# Patient Record
Sex: Male | Born: 1955 | Race: White | Hispanic: No | Marital: Married | Smoking: Current every day smoker
Health system: Southern US, Community
[De-identification: ages and names within clinical notes are randomized; demographics above are authoritative.]

## PROBLEM LIST (undated history)

## (undated) DIAGNOSIS — S98111A Complete traumatic amputation of right great toe, initial encounter: Secondary | ICD-10-CM

## (undated) DIAGNOSIS — M1990 Unspecified osteoarthritis, unspecified site: Secondary | ICD-10-CM

## (undated) DIAGNOSIS — M542 Cervicalgia: Secondary | ICD-10-CM

## (undated) DIAGNOSIS — I739 Peripheral vascular disease, unspecified: Secondary | ICD-10-CM

## (undated) DIAGNOSIS — C801 Malignant (primary) neoplasm, unspecified: Secondary | ICD-10-CM

## (undated) DIAGNOSIS — J189 Pneumonia, unspecified organism: Secondary | ICD-10-CM

## (undated) DIAGNOSIS — T8781 Dehiscence of amputation stump: Secondary | ICD-10-CM

## (undated) DIAGNOSIS — I96 Gangrene, not elsewhere classified: Secondary | ICD-10-CM

## (undated) DIAGNOSIS — E119 Type 2 diabetes mellitus without complications: Secondary | ICD-10-CM

## (undated) DIAGNOSIS — N400 Enlarged prostate without lower urinary tract symptoms: Secondary | ICD-10-CM

## (undated) DIAGNOSIS — M5126 Other intervertebral disc displacement, lumbar region: Secondary | ICD-10-CM

## (undated) HISTORY — DX: Complete traumatic amputation of right great toe, initial encounter: S98.111A

## (undated) HISTORY — PX: OTHER SURGICAL HISTORY: SHX169

## (undated) HISTORY — PX: CHOLECYSTECTOMY: SHX55

## (undated) HISTORY — PX: SKIN CANCER EXCISION: SHX779

## (undated) HISTORY — DX: Gangrene, not elsewhere classified: I96

## (undated) HISTORY — PX: TRANSTHORACIC ECHOCARDIOGRAM: SHX275

## (undated) HISTORY — PX: ABDOMINAL SURGERY: SHX537

---

## 1999-07-08 ENCOUNTER — Encounter: Payer: Self-pay | Admitting: Emergency Medicine

## 1999-07-08 ENCOUNTER — Inpatient Hospital Stay (HOSPITAL_COMMUNITY): Admission: EM | Admit: 1999-07-08 | Discharge: 1999-07-10 | Payer: Self-pay | Admitting: Emergency Medicine

## 2001-08-12 ENCOUNTER — Inpatient Hospital Stay (HOSPITAL_COMMUNITY): Admission: AC | Admit: 2001-08-12 | Discharge: 2001-08-16 | Payer: Self-pay

## 2001-08-12 ENCOUNTER — Encounter: Payer: Self-pay | Admitting: Emergency Medicine

## 2001-08-13 ENCOUNTER — Encounter: Payer: Self-pay | Admitting: General Surgery

## 2001-08-25 ENCOUNTER — Emergency Department (HOSPITAL_COMMUNITY): Admission: EM | Admit: 2001-08-25 | Discharge: 2001-08-25 | Payer: Self-pay | Admitting: Emergency Medicine

## 2001-08-25 ENCOUNTER — Inpatient Hospital Stay (HOSPITAL_COMMUNITY): Admission: EM | Admit: 2001-08-25 | Discharge: 2001-08-27 | Payer: Self-pay | Admitting: Emergency Medicine

## 2002-06-14 ENCOUNTER — Encounter: Payer: Self-pay | Admitting: General Surgery

## 2002-06-16 ENCOUNTER — Ambulatory Visit (HOSPITAL_COMMUNITY): Admission: RE | Admit: 2002-06-16 | Discharge: 2002-06-17 | Payer: Self-pay | Admitting: General Surgery

## 2004-12-23 ENCOUNTER — Encounter: Admission: RE | Admit: 2004-12-23 | Discharge: 2004-12-23 | Payer: Self-pay | Admitting: Orthopedic Surgery

## 2012-07-07 ENCOUNTER — Observation Stay (HOSPITAL_COMMUNITY)
Admission: EM | Admit: 2012-07-07 | Discharge: 2012-07-08 | Disposition: A | Discharge status: Home / Self Care | Payer: 59 | Attending: Internal Medicine | Admitting: Internal Medicine

## 2012-07-07 ENCOUNTER — Emergency Department (HOSPITAL_COMMUNITY): Payer: 59

## 2012-07-07 ENCOUNTER — Encounter (HOSPITAL_COMMUNITY): Payer: Self-pay | Admitting: Emergency Medicine

## 2012-07-07 DIAGNOSIS — R42 Dizziness and giddiness: Secondary | ICD-10-CM | POA: Insufficient documentation

## 2012-07-07 DIAGNOSIS — R61 Generalized hyperhidrosis: Secondary | ICD-10-CM | POA: Insufficient documentation

## 2012-07-07 DIAGNOSIS — E860 Dehydration: Secondary | ICD-10-CM | POA: Diagnosis present

## 2012-07-07 DIAGNOSIS — E119 Type 2 diabetes mellitus without complications: Secondary | ICD-10-CM | POA: Insufficient documentation

## 2012-07-07 DIAGNOSIS — R1110 Vomiting, unspecified: Secondary | ICD-10-CM | POA: Insufficient documentation

## 2012-07-07 DIAGNOSIS — Z9181 History of falling: Secondary | ICD-10-CM | POA: Insufficient documentation

## 2012-07-07 DIAGNOSIS — I959 Hypotension, unspecified: Secondary | ICD-10-CM | POA: Diagnosis present

## 2012-07-07 DIAGNOSIS — E875 Hyperkalemia: Secondary | ICD-10-CM | POA: Diagnosis present

## 2012-07-07 DIAGNOSIS — R55 Syncope and collapse: Principal | ICD-10-CM | POA: Diagnosis present

## 2012-07-07 DIAGNOSIS — D72829 Elevated white blood cell count, unspecified: Secondary | ICD-10-CM | POA: Diagnosis present

## 2012-07-07 HISTORY — DX: Benign prostatic hyperplasia without lower urinary tract symptoms: N40.0

## 2012-07-07 HISTORY — DX: Type 2 diabetes mellitus without complications: E11.9

## 2012-07-07 HISTORY — DX: Other intervertebral disc displacement, lumbar region: M51.26

## 2012-07-07 HISTORY — DX: Unspecified osteoarthritis, unspecified site: M19.90

## 2012-07-07 LAB — TROPONIN I: Troponin I: <0.3 ng/mL (ref <0.30)

## 2012-07-07 LAB — URINALYSIS, ROUTINE W REFLEX MICROSCOPIC
Bilirubin Urine: NEGATIVE
Glucose, UA: 100 mg/dL — AB
Hgb urine dipstick: NEGATIVE
Ketones, ur: NEGATIVE mg/dL
Leukocytes, UA: NEGATIVE
Nitrite: NEGATIVE
Protein, ur: NEGATIVE mg/dL
Specific Gravity, Urine: 1.01 (ref 1.005–1.030)
Urobilinogen, UA: 0.2 mg/dL (ref 0.0–1.0)
pH: 5.5 (ref 5.0–8.0)

## 2012-07-07 LAB — COMPREHENSIVE METABOLIC PANEL
ALT: 11 U/L (ref 0–53)
AST: 15 U/L (ref 0–37)
Albumin: 4 g/dL (ref 3.5–5.2)
Alkaline Phosphatase: 97 U/L (ref 39–117)
BUN: 11 mg/dL (ref 6–23)
CO2: 27 mEq/L (ref 19–32)
Calcium: 9.6 mg/dL (ref 8.4–10.5)
Chloride: 101 mEq/L (ref 96–112)
Creatinine, Ser: 0.86 mg/dL (ref 0.50–1.35)
GFR calc Af Amer: >90 mL/min (ref >90)
GFR calc non Af Amer: >90 mL/min (ref >90)
Glucose, Bld: 252 mg/dL — ABNORMAL HIGH (ref 70–99)
Potassium: 5.5 mEq/L — ABNORMAL HIGH (ref 3.5–5.1)
Sodium: 140 mEq/L (ref 135–145)
Total Bilirubin: 0.3 mg/dL (ref 0.3–1.2)
Total Protein: 6.9 g/dL (ref 6.0–8.3)

## 2012-07-07 LAB — CBC WITH DIFFERENTIAL/PLATELET
Basophils Absolute: 0 10*3/uL (ref 0.0–0.1)
Basophils Relative: 0 % (ref 0–1)
Eosinophils Absolute: 0 10*3/uL (ref 0.0–0.7)
Eosinophils Relative: 0 % (ref 0–5)
HCT: 42.7 % (ref 39.0–52.0)
Hemoglobin: 14.7 g/dL (ref 13.0–17.0)
Lymphocytes Relative: 7 % — ABNORMAL LOW (ref 12–46)
Lymphs Abs: 1.4 10*3/uL (ref 0.7–4.0)
MCH: 31.4 pg (ref 26.0–34.0)
MCHC: 34.4 g/dL (ref 30.0–36.0)
MCV: 91.2 fL (ref 78.0–100.0)
Monocytes Absolute: 1.1 10*3/uL — ABNORMAL HIGH (ref 0.1–1.0)
Monocytes Relative: 6 % (ref 3–12)
Neutro Abs: 17.5 10*3/uL — ABNORMAL HIGH (ref 1.7–7.7)
Neutrophils Relative %: 87 % — ABNORMAL HIGH (ref 43–77)
Platelets: 201 10*3/uL (ref 150–400)
RBC: 4.68 MIL/uL (ref 4.22–5.81)
RDW: 13.1 % (ref 11.5–15.5)
WBC: 20.1 10*3/uL — ABNORMAL HIGH (ref 4.0–10.5)

## 2012-07-07 LAB — GLUCOSE, CAPILLARY: Glucose-Capillary: 216 mg/dL — ABNORMAL HIGH (ref 70–99)

## 2012-07-07 MED ORDER — SODIUM CHLORIDE 0.9 % IV BOLUS (SEPSIS)
1000.0000 mL | Freq: Once | INTRAVENOUS | Status: AC
  Administered 2012-07-07: 1000 mL via INTRAVENOUS

## 2012-07-07 NOTE — ED Notes (Signed)
Admitting physician at bedside

## 2012-07-07 NOTE — ED Notes (Signed)
Patient unable to urinate at this time for urinalysis

## 2012-07-07 NOTE — ED Provider Notes (Signed)
History     CSN: [id number]  Arrival date & time 07/07/12  4696   First MD Initiated Contact with Patient 07/07/12 1825      Chief Complaint  Patient presents with  . Loss of Consciousness    (Consider location/radiation/quality/duration/timing/severity/associated sxs/prior treatment) HPI Comments: 56 year old type II diabetic male presents emergency department via EMS after having a syncopal episode about an hour ago. Patient was in the garage watching his son's band play when he began to feel flushed, lightheaded, dizzy and sweaty. He tried to walk to the door and states he fell his knees. According to EMS the son was able to catch him before he fell completely to the ground. Did not hit his head. States he was "out" for only 1 minute. EMS states when he was sitting in the garage his pressure was 124 systolic, and when he stands his pressure dropped to 84 systolic. CBG at that time was 143. Upon arriving to the emergency department patient vomited once. Earlier today he states he was feeling perfectly fine. Did not eat anything earlier today. Had a pepsi and gatorade while waiting for EMS. Does not have a primary care provider to manage his diabetes, and does not take the metformin he is supposed to be on. Currently he is complaining of lightheadedness, dizziness and weakness. Denies any other medical history. Denies fever, chills, chest pain, shortness of breath, nausea. No history of heart attack or stroke.  Patient is a 56 y.o. male presenting with syncope. The history is provided by the patient and the EMS personnel.  Loss of Consciousness Associated symptoms include diaphoresis, vomiting and weakness. Pertinent negatives include no abdominal pain, chest pain, chills, fever, nausea or neck pain.    Past Medical History  Diagnosis Date  . Diabetes mellitus without complication   . Enlarged prostate   . Arthritis   . Herniated lumbar intervertebral disc     History reviewed. No  pertinent past surgical history.  History reviewed. No pertinent family history.  History  Substance Use Topics  . Smoking status: Current Every Day Smoker  . Smokeless tobacco: Not on file  . Alcohol Use: No      Review of Systems  Constitutional: Positive for diaphoresis. Negative for fever, chills, activity change and appetite change.  HENT: Negative for neck pain and neck stiffness.   Eyes: Negative for visual disturbance.  Respiratory: Negative for shortness of breath.   Cardiovascular: Positive for syncope. Negative for chest pain.  Gastrointestinal: Positive for vomiting. Negative for nausea and abdominal pain.  Genitourinary: Negative for dysuria, urgency, frequency and difficulty urinating.  Musculoskeletal: Negative.   Skin: Positive for pallor.  Neurological: Positive for dizziness, weakness and light-headedness.  Psychiatric/Behavioral: Negative for confusion.    Allergies  Review of patient's allergies indicates no known allergies.  Home Medications  No current outpatient prescriptions on file.  BP 113/73  Pulse 80  Temp 97.4 F (36.3 C) (Oral)  Resp 18  SpO2 93%  Physical Exam  Constitutional: He is oriented to person, place, and time. He appears well-developed and well-nourished. No distress.       Appears tired.  HENT:  Head: Normocephalic and atraumatic.  Mouth/Throat: Uvula is midline and oropharynx is clear and moist. Mucous membranes are pale.  Eyes: Conjunctivae normal and EOM are normal. Pupils are equal, round, and reactive to light.  Neck: Normal range of motion. Neck supple.  Cardiovascular: Normal rate, regular rhythm, normal heart sounds and intact distal pulses.   Pulmonary/Chest:  Effort normal and breath sounds normal. No respiratory distress. He has no wheezes. He has no rales.  Abdominal: Soft. Bowel sounds are normal. There is no tenderness.  Musculoskeletal: Normal range of motion. He exhibits no edema.  Neurological: He is alert  and oriented to person, place, and time. He has normal strength. No cranial nerve deficit or sensory deficit. GCS eye subscore is 4. GCS verbal subscore is 5. GCS motor subscore is 6.  Skin: Skin is warm. No pallor.       Clammy  Psychiatric: He has a normal mood and affect. His behavior is normal.    ED Course  Procedures (including critical care time)  Labs Reviewed  GLUCOSE, CAPILLARY - Abnormal; Notable for the following:    Glucose-Capillary 216 (*)     All other components within normal limits  CBC WITH DIFFERENTIAL - Abnormal; Notable for the following:    WBC 20.1 (*)     Neutrophils Relative 87 (*)     Neutro Abs 17.5 (*)     Lymphocytes Relative 7 (*)     Monocytes Absolute 1.1 (*)     All other components within normal limits  COMPREHENSIVE METABOLIC PANEL - Abnormal; Notable for the following:    Potassium 5.5 (*)     Glucose, Bld 252 (*)     All other components within normal limits  URINALYSIS, ROUTINE W REFLEX MICROSCOPIC - Abnormal; Notable for the following:    Glucose, UA 100 (*)     All other components within normal limits  TROPONIN I   Date: 07/07/2012  Rate: 81  Rhythm: normal sinus rhythm  QRS Axis: normal  Intervals: normal  ST/T Wave abnormalities: nonspecific T wave changes  Conduction Disutrbances:none  Narrative Interpretation: sinus rhythm, borderline T abnormalities, anterior-lateral leads  Old EKG Reviewed: none available   Dg Chest 2 View  07/07/2012  *RADIOLOGY REPORT*  Clinical Data:  Syncope and weakness.  CHEST - 2 VIEW  Comparison: None  Findings: The heart size and mediastinal contours are within normal limits.  Both lungs are clear.  The visualized skeletal structures are unremarkable.  IMPRESSION: No active disease.   Original Report Authenticated By: Irish Lack, M.D.      1. Syncope   2. Leukocytosis       MDM  56 y/o male with diabetes presenting with syncopal episode associated with diaphoresis. Leukocytosis of 20.1.  Origin unknown. Urine clean. CXR unremarkable. Patient will be admitted by Dr. Felipa Furnace. 2L fluid bolus given. Patient currently feeling fine and asymptomatic.        Trevor Mace, PA-C 07/07/12 2310

## 2012-07-07 NOTE — H&P (Signed)
PCP:  none   Chief Complaint:  syncope  HPI: [name] is a 56 y.o. male   has a past medical history of Diabetes mellitus without complication; Enlarged prostate; Arthritis; and Herniated lumbar intervertebral disc.   Presented with  He was listening to his son's band practice and got hot tried to step out and suddenly synopsize. He have not had anything to eat. Denies any chest pain in ambulance he felt some shortness of breath lasting 2-3 min and had some nausea and vomiting. He states he have had syncopal episodes in the past. Last episode in 2000. He denies ever having a stress test.  He has hx of BPH.    Review of Systems:    Pertinent positives include: shortness of breath at rest.  nausea, vomiting,   Constitutional:  No weight loss, night sweats, Fevers, chills, fatigue, weight loss  HEENT:  No headaches, Difficulty swallowing,Tooth/dental problems,Sore throat,  No sneezing, itching, ear ache, nasal congestion, post nasal drip,  Cardio-vascular:  No chest pain, Orthopnea, PND, anasarca, dizziness, palpitations.no Bilateral lower extremity swelling  GI:  No heartburn, indigestion, abdominal pain,diarrhea, change in bowel habits, loss of appetite, melena, blood in stool, hematemesis Resp:  no No dyspnea on exertion, No excess mucus, no productive cough, No non-productive cough, No coughing up of blood.No change in color of mucus.No wheezing. Skin:  no rash or lesions. No jaundice GU:  no dysuria, change in color of urine, no urgency or frequency. No straining to urinate.  No flank pain.  Musculoskeletal:  No joint pain or no joint swelling. No decreased range of motion. No back pain.  Psych:  No change in mood or affect. No depression or anxiety. No memory loss.  Neuro: no localizing neurological complaints, no tingling, no weakness, no double vision, no gait abnormality, no slurred speech, no confusion  Otherwise ROS are negative except for above, 10 systems were  reviewed  Past Medical History: Past Medical History  Diagnosis Date  . Diabetes mellitus without complication   . Enlarged prostate   . Arthritis   . Herniated lumbar intervertebral disc    Past Surgical History  Procedure Date  . Open chest      Medications: Prior to Admission medications   Medication Sig Start Date End Date Taking? Authorizing Provider  oxyCODONE-acetaminophen (PERCOCET/ROXICET) 5-325 MG per tablet Take 1 tablet by mouth every 4 (four) hours as needed. For pain   Yes Historical Provider, MD    Allergies:  No Known Allergies  Social History:  Ambulatory   Independently  Lives at  Home with family   reports that he has been smoking.  He does not have any smokeless tobacco history on file. He reports that he uses illicit drugs (Marijuana). He reports that he does not drink alcohol.   Family History: family history includes Asthma in his mother and Heart disease in his father and mother.    Physical Exam: Patient Vitals for the past 24 hrs:  BP Temp Temp src Pulse Resp SpO2  07/07/12 2130 95/71 mmHg - - 71  20  98 %  07/07/12 2100 101/70 mmHg - - 70  13  97 %  07/07/12 2030 93/70 mmHg - - 88  22  98 %  07/07/12 2016 105/73 mmHg - - 93  - -  07/07/12 2015 106/89 mmHg - - 84  - -  07/07/12 2013 89/63 mmHg - - 72  - -  07/07/12 1900 94/61 mmHg - - 75  12  90 %  07/07/12 1827 113/73 mmHg 97.4 F (36.3 C) Oral 80  18  93 %    1. General:  in No Acute distress 2. Psychological: Alert and  Oriented 3. Head/ENT:    Dry Mucous Membranes                          Head Non traumatic, neck supple                           Poor Dentition 4. SKIN:   decreased Skin turgor,  Skin clean Dry and intact no rash 5. Heart: Regular rate and rhythm no Murmur, Rub or gallop 6. Lungs: somewhat distant but no wheezes or crackles   7. Abdomen: Soft, non-tender, Non distended 8. Lower extremities: no clubbing, cyanosis, or edema 9. Neurologically Grossly intact, moving  all 4 extremities equally 10. MSK: Normal range of motion  body mass index is unknown because there is no height or weight on file.   Labs on Admission:   [facility] 07/07/12 1950  NA 140  K 5.5*  CL 101  CO2 27  GLUCOSE 252*  BUN 11  CREATININE 0.86  CALCIUM 9.6  MG --  PHOS --    Basename 07/07/12 1950  AST 15  ALT 11  ALKPHOS 97  BILITOT 0.3  PROT 6.9  ALBUMIN 4.0   No results found for this basename: LIPASE:2,AMYLASE:2 in the last 72 hours  Basename 07/07/12 1950  WBC 20.1*  NEUTROABS 17.5*  HGB 14.7  HCT 42.7  MCV 91.2  PLT 201   No results found for this basename: CKTOTAL:3,CKMB:3,CKMBINDEX:3,TROPONINI:3 in the last 72 hours No results found for this basename: TSH,T4TOTAL,FREET3,T3FREE,THYROIDAB in the last 72 hours No results found for this basename: VITAMINB12:2,FOLATE:2,FERRITIN:2,TIBC:2,IRON:2,RETICCTPCT:2 in the last 72 hours No results found for this basename: HGBA1C    CrCl is unknown because there is no height on file for the current visit. ABG No results found for this basename: phart, pco2, po2, hco3, tco2, acidbasedef, o2sat     No results found for this basename: DDIMER     Other results:  I have pearsonaly reviewed this: ECG REPORT  Rate: 81  Rhythm: NSR  ST&T Change: NO ischemic changes.   UA no evidence of infection   Cultures: No results found for this basename: sdes, specrequest, cult, reptstatus       Radiological Exams on Admission: Dg Chest 2 View  07/07/2012  *RADIOLOGY REPORT*  Clinical Data:  Syncope and weakness.  CHEST - 2 VIEW  Comparison: None  Findings: The heart size and mediastinal contours are within normal limits.  Both lungs are clear.  The visualized skeletal structures are unremarkable.  IMPRESSION: No active disease.   Original Report Authenticated By: Irish Lack, M.D.     Chart has been reviewed  Assessment/Plan  56 yo male w hx of diabetes and non compliance here with syncopal episode.    Present on Admission:  . Syncope - will admit to telemetry, cycle CE, echo, carotid dopplers. Pending further work up would need inpatient vs outpatient stress test.  . Dehydration - give IV and obtain set of orthostatics  . Hypotension - improved with IVF . Leukocytosis - no evidence of infection will repeat after IVF given . Hyperkalemia - monitor on telemetry and repeat will likely improve with IVF.    Prophylaxis:   Lovenox   CODE STATUS: FULL CODE  Other  plan as per orders.  I have spent a total of 55 min on this admission  Anjolaoluwa Siguenza 07/07/2012, 11:17 PM

## 2012-07-07 NOTE — ED Notes (Signed)
Per EMS, pt was at son's home in [place] didn't feel well, felt hot and flush, stood up to get fresh air and was about to faint when son caught him and sat him down. LOC for about a minute. CBG 143, pale, diaphoretic, NSR, initial EKG brady. BP 124 sitting systolic, standing dropped 84 systolic. High 90's O2 on ra. Emesis x1, transported in trendelenburg position with BP 126. A&Ox4, ambulatory.

## 2012-07-07 NOTE — ED Notes (Signed)
Patient stated "I feel lightheaded and dizzy when I sit up."

## 2012-07-07 NOTE — ED Notes (Addendum)
Pt states he had a heat flash, stood up to walk outside, fell to his knees at son's house today with syncopal episode, son caught pt in arms, LOC for about a minute. A&Ox4, ambulatory, stable.

## 2012-07-08 DIAGNOSIS — I959 Hypotension, unspecified: Secondary | ICD-10-CM

## 2012-07-08 DIAGNOSIS — R55 Syncope and collapse: Secondary | ICD-10-CM

## 2012-07-08 LAB — HEMOGLOBIN A1C
Hgb A1c MFr Bld: 8.5 % — ABNORMAL HIGH (ref <5.7)
Mean Plasma Glucose: 197 mg/dL — ABNORMAL HIGH (ref <117)

## 2012-07-08 LAB — COMPREHENSIVE METABOLIC PANEL
ALT: 11 U/L (ref 0–53)
AST: 14 U/L (ref 0–37)
Albumin: 3.2 g/dL — ABNORMAL LOW (ref 3.5–5.2)
Alkaline Phosphatase: 85 U/L (ref 39–117)
BUN: 10 mg/dL (ref 6–23)
CO2: 29 mEq/L (ref 19–32)
Calcium: 9.2 mg/dL (ref 8.4–10.5)
Chloride: 103 mEq/L (ref 96–112)
Creatinine, Ser: 0.77 mg/dL (ref 0.50–1.35)
GFR calc Af Amer: >90 mL/min (ref >90)
GFR calc non Af Amer: >90 mL/min (ref >90)
Glucose, Bld: 160 mg/dL — ABNORMAL HIGH (ref 70–99)
Potassium: 4.2 mEq/L (ref 3.5–5.1)
Sodium: 141 mEq/L (ref 135–145)
Total Bilirubin: 0.3 mg/dL (ref 0.3–1.2)
Total Protein: 6.1 g/dL (ref 6.0–8.3)

## 2012-07-08 LAB — CBC
HCT: 39.9 % (ref 39.0–52.0)
Hemoglobin: 13.6 g/dL (ref 13.0–17.0)
MCH: 31.3 pg (ref 26.0–34.0)
MCHC: 34.1 g/dL (ref 30.0–36.0)
MCV: 91.7 fL (ref 78.0–100.0)
Platelets: 193 10*3/uL (ref 150–400)
RBC: 4.35 MIL/uL (ref 4.22–5.81)
RDW: 13.1 % (ref 11.5–15.5)
WBC: 12.3 10*3/uL — ABNORMAL HIGH (ref 4.0–10.5)

## 2012-07-08 LAB — GLUCOSE, CAPILLARY
Glucose-Capillary: 245 mg/dL — ABNORMAL HIGH (ref 70–99)
Glucose-Capillary: 200 mg/dL — ABNORMAL HIGH (ref 70–99)
Glucose-Capillary: 130 mg/dL — ABNORMAL HIGH (ref 70–99)

## 2012-07-08 LAB — TROPONIN I
Troponin I: <0.3 ng/mL (ref <0.30)
Troponin I: <0.3 ng/mL (ref <0.30)

## 2012-07-08 LAB — D-DIMER, QUANTITATIVE (NOT AT ARMC): D-Dimer, Quant: 0.42 ug/mL-FEU (ref 0.00–0.48)

## 2012-07-08 LAB — MAGNESIUM: Magnesium: 2 mg/dL (ref 1.5–2.5)

## 2012-07-08 LAB — PHOSPHORUS: Phosphorus: 4.4 mg/dL (ref 2.3–4.6)

## 2012-07-08 LAB — TSH: TSH: 1.039 u[IU]/mL (ref 0.350–4.500)

## 2012-07-08 MED ORDER — LIVING WELL WITH DIABETES BOOK
Freq: Once | Status: AC
  Administered 2012-07-08: 15:00:00
  Filled 2012-07-08: qty 1

## 2012-07-08 MED ORDER — DOCUSATE SODIUM 100 MG PO CAPS
100.0000 mg | ORAL_CAPSULE | Freq: Two times a day (BID) | ORAL | Status: DC
  Filled 2012-07-08 (×2): qty 1

## 2012-07-08 MED ORDER — ACETAMINOPHEN 325 MG PO TABS: 650.0000 mg | ORAL_TABLET | Freq: Four times a day (QID) | ORAL | Status: DC | PRN

## 2012-07-08 MED ORDER — ASPIRIN 81 MG PO TBEC: 81.0000 mg | DELAYED_RELEASE_TABLET | Freq: Every day | ORAL | Status: DC

## 2012-07-08 MED ORDER — SODIUM CHLORIDE 0.9 % IJ SOLN
3.0000 mL | Freq: Two times a day (BID) | INTRAMUSCULAR | Status: DC
  Administered 2012-07-08: 3 mL via INTRAVENOUS

## 2012-07-08 MED ORDER — HYDROCODONE-ACETAMINOPHEN 5-325 MG PO TABS
1.0000 | ORAL_TABLET | ORAL | Status: DC | PRN
  Administered 2012-07-08: 2 via ORAL
  Filled 2012-07-08: qty 2

## 2012-07-08 MED ORDER — OXYCODONE-ACETAMINOPHEN 5-325 MG PO TABS: 1.0000 | ORAL_TABLET | ORAL | Status: DC | PRN

## 2012-07-08 MED ORDER — ONDANSETRON HCL 4 MG PO TABS: 4.0000 mg | ORAL_TABLET | Freq: Four times a day (QID) | ORAL | Status: DC | PRN

## 2012-07-08 MED ORDER — GLIPIZIDE 10 MG PO TABS
10.0000 mg | ORAL_TABLET | Freq: Every day | ORAL | Status: DC
  Filled 2012-07-08: qty 1

## 2012-07-08 MED ORDER — INSULIN ASPART 100 UNIT/ML ~~LOC~~ SOLN: 0.0000 [IU] | Freq: Three times a day (TID) | SUBCUTANEOUS | Status: DC

## 2012-07-08 MED ORDER — ENOXAPARIN SODIUM 40 MG/0.4ML ~~LOC~~ SOLN
40.0000 mg | Freq: Every day | SUBCUTANEOUS | Status: DC
  Filled 2012-07-08: qty 0.4

## 2012-07-08 MED ORDER — GLIPIZIDE 10 MG PO TABS: 10.0000 mg | ORAL_TABLET | Freq: Every day | ORAL | Status: DC

## 2012-07-08 MED ORDER — ASPIRIN EC 81 MG PO TBEC
81.0000 mg | DELAYED_RELEASE_TABLET | Freq: Every day | ORAL | Status: DC
  Administered 2012-07-08: 81 mg via ORAL
  Filled 2012-07-08: qty 1

## 2012-07-08 MED ORDER — SODIUM CHLORIDE 0.9 % IV SOLN
INTRAVENOUS | Status: AC
  Administered 2012-07-08: 02:00:00 via INTRAVENOUS

## 2012-07-08 MED ORDER — INSULIN ASPART 100 UNIT/ML ~~LOC~~ SOLN: 0.0000 [IU] | Freq: Every day | SUBCUTANEOUS | Status: DC

## 2012-07-08 MED ORDER — ONDANSETRON HCL 4 MG/2ML IJ SOLN: 4.0000 mg | Freq: Four times a day (QID) | INTRAMUSCULAR | Status: DC | PRN

## 2012-07-08 MED ORDER — ACETAMINOPHEN 650 MG RE SUPP: 650.0000 mg | Freq: Four times a day (QID) | RECTAL | Status: DC | PRN

## 2012-07-08 NOTE — Progress Notes (Signed)
  Echocardiogram 2D Echocardiogram has been performed.  [name] 07/08/2012, 2:40 PM

## 2012-07-08 NOTE — Progress Notes (Signed)
DC orders received.  Patient stable with no S/S of distress.  Medication and discharge information reviewed with patient and family. Patient DC home. Nolon Nations

## 2012-07-08 NOTE — Discharge Summary (Signed)
Physician Discharge Summary  [name] ZOX:[id number] DOB: 11/13/1955 DOA: 07/07/2012  PCP: Sheila Oats, MD  Admit date: 07/07/2012 Discharge date: 07/08/2012  Time spent: 25 minutes  Recommendations for Outpatient Follow-up:  1. Home. Follow up with [facility] in 1-2 weeks as outpatient.   Discharge Diagnoses:  Principal Problem:  *Syncope Active Problems:  Dehydration  Hypotension  Leukocytosis  Hyperkalemia   Discharge Condition: fair  Diet recommendation: diabetic  There were no vitals filed for this visit.  History of present illness:  [name] is a 56 y.o. male with past medical history of Diabetes mellitus without complication; Enlarged prostate; Arthritis; and Herniated lumbar intervertebral disc presented with acute syncope. He informs not eating anything throughout the day and passed ut for less than a minute. . Denies any chest pain in ambulance he felt some shortness of breath lasting 2-3 min and had some nausea and vomiting. He states he have had syncopal episodes in the past. Last episode in 2000. He denies ever having a stress test. No witnessed seizure like activity.    Hospital Course:  Syncope  likely related to dehydration and vasovagal in nature patient given IV fluids with imprvoement. No orthostasis.  serial CE negative. EKG unremarkable  stale on tele  2D echo and carotid dopplers wnl  patient stable to be discharged home with [facility] in 1-2 weeks. patient is not compliant with medical check ups or taking medications and i doubt he will come for a follow up in the clinic.  i have counseled him on the importance of health maintenance and follow up on his medical issues given rash factors including uncontrolled DM and active smoking.  Diabetes mellitus  A1C of 8.5  patient on metformin 500 mg daily at home but not taking it as he gets nauseous with it. Will switch to glipizide 10 mg daily. Recommended outpatient follow up.  will d/c on baby aspirin daily as well.   Tobacco use counseled on smoking cessation. refuses to quit.    Procedures:  none  Consultations:  none  Discharge Exam: Filed Vitals:   07/08/12 0000 07/08/12 0030 07/08/12 0123 07/08/12 0500  BP: 116/78 102/72 112/75 97/66  Pulse: 64 73 78 71  Temp:   97.5 F (36.4 C) 97.5 F (36.4 C)  TempSrc:   Oral Oral  Resp: 18 13    Height:   5\' 10"  (1.778 m)   SpO2: 98% 96% 98% 97%    General:middle aged male in NAD HEENT: no pallor, moist oral mucosa Cardiovascular:clear b/l, no added sounds Respiratory: clear b/l, no added sounds ABD: soft, NT ND, BS+ Ext: warm, no edmea CNS: AAOX 3, non focal  Discharge Instructions     Medication List     As of 07/08/2012  2:49 PM    TAKE these medications         aspirin 81 MG EC tablet   Take 1 tablet (81 mg total) by mouth daily.      glipiZIDE 10 MG tablet   Commonly known as: GLUCOTROL   Take 1 tablet (10 mg total) by mouth daily before breakfast.      oxyCODONE-acetaminophen 5-325 MG per tablet   Commonly known as: PERCOCET/ROXICET   Take 1 tablet by mouth every 4 (four) hours as needed. For pain           Follow-up Information    Follow up with [facility].   Contact information:   Call [phone] to schedule appoint with [facility] in  1-2 weeks           The results of significant diagnostics from this hospitalization (including imaging, microbiology, ancillary and laboratory) are listed below for reference.    Significant Diagnostic Studies: Dg Chest 2 View  07/07/2012  *RADIOLOGY REPORT*  Clinical Data:  Syncope and weakness.  CHEST - 2 VIEW  Comparison: None  Findings: The heart size and mediastinal contours are within normal limits.  Both lungs are clear.  The visualized skeletal structures are unremarkable.  IMPRESSION: No active disease.   Original Report Authenticated By: Irish Lack, M.D.     Microbiology: No results found for this or any previous  visit (from the past 240 hour(s)).   Labs: Basic Metabolic Panel:  Lab 07/08/12 1610 07/07/12 1950  NA 141 140  K 4.2 5.5*  CL 103 101  CO2 29 27  GLUCOSE 160* 252*  BUN 10 11  CREATININE 0.77 0.86  CALCIUM 9.2 9.6  MG 2.0 --  PHOS 4.4 --   Liver Function Tests:  Lab 07/08/12 0528 07/07/12 1950  AST 14 15  ALT 11 11  ALKPHOS 85 97  BILITOT 0.3 0.3  PROT 6.1 6.9  ALBUMIN 3.2* 4.0   No results found for this basename: LIPASE:5,AMYLASE:5 in the last 168 hours No results found for this basename: AMMONIA:5 in the last 168 hours CBC:  Lab 07/08/12 0528 07/07/12 1950  WBC 12.3* 20.1*  NEUTROABS -- 17.5*  HGB 13.6 14.7  HCT 39.9 42.7  MCV 91.7 91.2  PLT 193 201   Cardiac Enzymes:  Lab 07/08/12 0950 07/08/12 0528 07/07/12 2243  CKTOTAL -- -- --  CKMB -- -- --  CKMBINDEX -- -- --  TROPONINI <0.30 <0.30 <0.30   BNP: BNP (last 3 results) No results found for this basename: PROBNP:3 in the last 8760 hours CBG:  Lab 07/08/12 0724 07/08/12 0336 07/08/12 0209 07/07/12 1829  GLUCAP 130* 200* 245* 216*       Signed:  Kamani Magnussen  [facility] 07/08/2012, 2:49 PM

## 2012-07-08 NOTE — Progress Notes (Signed)
12/5   Attempted to visit patient, but was gone for test.  Received consult for Diabetes coordinator and have asked staff RNs to have patient watch DM videos and RD consult &  Living Well with Diabetes booklet have been ordered.  Staff RNs to give patient [place] Notes on diabetes.  Will continue to follow while in hospital.  Will follow up tomorrow with patient.  Smith Mince RN BSN CDE

## 2012-07-08 NOTE — Progress Notes (Addendum)
Nutrition Consult -- Education Note  RD consulted for nutrition education regarding diabetes -- patient refused.  Lab Results  Component Value Date   HGBA1C 8.5* 07/08/2012    RD provided "Carbohydrate Counting for People with Diabetes" handout from the Academy of [employer] Dietetics.   There is no weight on file to calculate BMI.  Current diet order is Carbohydrate Modified Medium Calorie, reports a good appetite. Labs and medications reviewed.   No further nutrition interventions warranted at this time.  Please re-consult RD as needed.  Kirkland Hun, RD, LDN Pager #: [phone] After-Hours Pager #: [phone]

## 2012-07-08 NOTE — Progress Notes (Signed)
VASCULAR LAB PRELIMINARY  PRELIMINARY  PRELIMINARY  PRELIMINARY  Carotid Dopplers completed.    Preliminary report:  There is no ICA stenosis.  Vertebral artery flow is antegrade.  [name], 07/08/2012, 2:33 PM

## 2012-07-08 NOTE — Progress Notes (Signed)
Utilization review completed.  

## 2012-07-09 LAB — DRUGS OF ABUSE SCREEN W/O ALC, ROUTINE URINE
Amphetamine Screen, Ur: NEGATIVE
Barbiturate Quant, Ur: NEGATIVE
Benzodiazepines.: NEGATIVE
Cocaine Metabolites: NEGATIVE
Creatinine,U: 95.7 mg/dL
Marijuana Metabolite: POSITIVE — AB
Methadone: NEGATIVE
Opiate Screen, Urine: NEGATIVE
Phencyclidine (PCP): NEGATIVE
Propoxyphene: NEGATIVE

## 2012-07-12 LAB — THC (MARIJUANA), URINE, CONFIRMATION: Marijuana, Ur-Confirmation: 1836 ng/mL

## 2012-07-12 NOTE — ED Provider Notes (Signed)
Medical screening examination/treatment/procedure(s) were performed by non-physician practitioner and as supervising physician I was immediately available for consultation/collaboration.  Maripat Borba R. Johnathan Heskett, MD 07/12/12 1559 

## 2012-12-17 ENCOUNTER — Other Ambulatory Visit: Payer: Self-pay | Admitting: Internal Medicine

## 2017-02-13 ENCOUNTER — Encounter (HOSPITAL_COMMUNITY): Payer: Self-pay | Admitting: Oncology

## 2017-02-13 ENCOUNTER — Emergency Department (HOSPITAL_COMMUNITY): Payer: Self-pay

## 2017-02-13 ENCOUNTER — Other Ambulatory Visit: Payer: Self-pay

## 2017-02-13 DIAGNOSIS — F17210 Nicotine dependence, cigarettes, uncomplicated: Secondary | ICD-10-CM | POA: Insufficient documentation

## 2017-02-13 DIAGNOSIS — E119 Type 2 diabetes mellitus without complications: Secondary | ICD-10-CM | POA: Insufficient documentation

## 2017-02-13 DIAGNOSIS — M5412 Radiculopathy, cervical region: Secondary | ICD-10-CM | POA: Insufficient documentation

## 2017-02-13 DIAGNOSIS — R200 Anesthesia of skin: Secondary | ICD-10-CM | POA: Insufficient documentation

## 2017-02-13 DIAGNOSIS — R55 Syncope and collapse: Secondary | ICD-10-CM | POA: Insufficient documentation

## 2017-02-13 DIAGNOSIS — M25511 Pain in right shoulder: Secondary | ICD-10-CM | POA: Insufficient documentation

## 2017-02-13 NOTE — ED Triage Notes (Signed)
Per pt when he got up this am he had a, "Hot flash."  Pt states that he was walking to his bathroom when he had a syncopal episode.  Pt reports walking up on the bathroom floor on his right side.  Pt c/o right shoulder pain.  Swelling noted.  Pt has limited ROM in right arm.  Pt rates pain 7/10, sharp in nature.

## 2017-02-14 ENCOUNTER — Emergency Department (HOSPITAL_COMMUNITY): Payer: Self-pay

## 2017-02-14 ENCOUNTER — Emergency Department (HOSPITAL_COMMUNITY)
Admission: EM | Admit: 2017-02-14 | Discharge: 2017-02-14 | Disposition: A | Discharge status: Home / Self Care | Payer: Self-pay | Attending: Emergency Medicine | Admitting: Emergency Medicine

## 2017-02-14 DIAGNOSIS — M5412 Radiculopathy, cervical region: Secondary | ICD-10-CM

## 2017-02-14 HISTORY — DX: Cervicalgia: M54.2

## 2017-02-14 LAB — COMPREHENSIVE METABOLIC PANEL
ALT: 12 U/L — ABNORMAL LOW (ref 17–63)
AST: 17 U/L (ref 15–41)
Albumin: 4.4 g/dL (ref 3.5–5.0)
Alkaline Phosphatase: 97 U/L (ref 38–126)
Anion gap: 9 (ref 5–15)
BUN: 10 mg/dL (ref 6–20)
CO2: 26 mmol/L (ref 22–32)
Calcium: 9.7 mg/dL (ref 8.9–10.3)
Chloride: 104 mmol/L (ref 101–111)
Creatinine, Ser: 0.79 mg/dL (ref 0.61–1.24)
GFR calc Af Amer: >60 mL/min (ref >60)
GFR calc non Af Amer: >60 mL/min (ref >60)
Glucose, Bld: 139 mg/dL — ABNORMAL HIGH (ref 65–99)
Potassium: 4 mmol/L (ref 3.5–5.1)
Sodium: 139 mmol/L (ref 135–145)
Total Bilirubin: 0.9 mg/dL (ref 0.3–1.2)
Total Protein: 7.9 g/dL (ref 6.5–8.1)

## 2017-02-14 LAB — CBC WITH DIFFERENTIAL/PLATELET
Basophils Absolute: 0.1 10*3/uL (ref 0.0–0.1)
Basophils Relative: 1 %
Eosinophils Absolute: 0.5 10*3/uL (ref 0.0–0.7)
Eosinophils Relative: 4 %
HCT: 42.7 % (ref 39.0–52.0)
Hemoglobin: 15.4 g/dL (ref 13.0–17.0)
Lymphocytes Relative: 33 %
Lymphs Abs: 4 10*3/uL (ref 0.7–4.0)
MCH: 32 pg (ref 26.0–34.0)
MCHC: 36.1 g/dL — ABNORMAL HIGH (ref 30.0–36.0)
MCV: 88.8 fL (ref 78.0–100.0)
Monocytes Absolute: 1.1 10*3/uL — ABNORMAL HIGH (ref 0.1–1.0)
Monocytes Relative: 10 %
Neutro Abs: 6.2 10*3/uL (ref 1.7–7.7)
Neutrophils Relative %: 52 %
Platelets: 223 10*3/uL (ref 150–400)
RBC: 4.81 MIL/uL (ref 4.22–5.81)
RDW: 13.5 % (ref 11.5–15.5)
WBC: 11.9 10*3/uL — ABNORMAL HIGH (ref 4.0–10.5)

## 2017-02-14 LAB — POCT I-STAT TROPONIN I: Troponin i, poc: 0.01 ng/mL (ref 0.00–0.08)

## 2017-02-14 LAB — GLUCOSE, CAPILLARY: Glucose-Capillary: 138 mg/dL — ABNORMAL HIGH (ref 65–99)

## 2017-02-14 MED ORDER — DEXAMETHASONE SODIUM PHOSPHATE 10 MG/ML IJ SOLN
10.0000 mg | Freq: Once | INTRAMUSCULAR | Status: AC
  Administered 2017-02-14: 10 mg via INTRAVENOUS
  Filled 2017-02-14: qty 1

## 2017-02-14 MED ORDER — PREDNISONE 20 MG PO TABS: ORAL_TABLET | ORAL | 0 refills | Status: DC

## 2017-02-14 MED ORDER — CYCLOBENZAPRINE HCL 5 MG PO TABS: 5.0000 mg | ORAL_TABLET | Freq: Three times a day (TID) | ORAL | 0 refills | Status: DC | PRN

## 2017-02-14 NOTE — ED Notes (Signed)
PT DISCHARGED. INSTRUCTIONS AND PRESCRIPTIONS GIVEN. AAOX4. PT IN NO APPARENT DISTRESS WITH MILD PAIN. THE OPPORTUNITY TO ASK QUESTIONS WAS PROVIDED. 

## 2017-02-14 NOTE — ED Provider Notes (Signed)
[facility] DEPT Provider Note   CSN: [id number] Arrival date & time: 02/13/17  1946  By signing my name below, I, Ny'Kea Lewis, attest that this documentation has been prepared under the direction and in the presence of Rolland Porter, MD. Electronically Signed: Lise Auer, ED Scribe. 02/14/17. 2:42 AM.  History   Chief Complaint Chief Complaint  Patient presents with  . Loss of Consciousness    right shoulder pain   The history is provided by the patient. No language interpreter was used.    HPI Comments: [name] is a 61 y.o. male with a history of  DM II, who presents to the Emergency Department complaining of one syncopal episode that occurred yesterday. Pt notes associated right shoulder pain, worsening acute on chronic neck pain, and numbness of the last three fingers of the right hand. PT reports he stood up fast when trying to get out of his bed to get to the bathroom when he loss consciousness. He states he felt light headed and dizzy just before he passed out. Denies head injury. Pt notes since this episode his chronic neck pain has worsened. He states his neck feels stiff. He reports his neck pain is worsened with ROM. Denies headache. He has had numbness in his RLF, RRF and RMF x 2 weeks, he states if he coughs his numbness gets worse and his right shoulder hurts more.   Patient states he used to see Dr. Marcial Pacas for his back problems.  PCP none Orthopedist Dr Marcial Pacas  Past Medical History:  Diagnosis Date  . Arthritis   . Diabetes mellitus without complication ([place])   . Enlarged prostate   . Herniated lumbar intervertebral disc   . Neck pain    Patient Active Problem List   Diagnosis Date Noted  . Dehydration 07/07/2012  . Syncope 07/07/2012  . Hypotension 07/07/2012  . Leukocytosis 07/07/2012  . Hyperkalemia 07/07/2012   Past Surgical History:  Procedure Laterality Date  . ABDOMINAL SURGERY    . open chest      Home Medications    Prior to Admission  medications   Medication Sig Start Date End Date Taking? Authorizing Provider  acetaminophen (TYLENOL) 500 MG tablet Take 1,000 mg by mouth every 6 (six) hours as needed for mild pain, moderate pain, fever or headache.   Yes [provider]  aspirin EC 81 MG EC tablet Take 1 tablet (81 mg total) by mouth daily. Patient not taking: Reported on 02/14/2017 07/08/12   Dhungel, Flonnie Overman, MD  cyclobenzaprine (FLEXERIL) 5 MG tablet Take 1 tablet (5 mg total) by mouth 3 (three) times daily as needed for muscle spasms. 02/14/17   Rolland Porter, MD  glipiZIDE (GLUCOTROL) 10 MG tablet Take 1 tablet (10 mg total) by mouth daily before breakfast. Patient not taking: Reported on 02/14/2017 07/08/12   Dhungel, Flonnie Overman, MD  predniSONE (DELTASONE) 20 MG tablet Take 3 po QD x 3d , then 2 po QD x 3d then 1 po QD x 3d 02/14/17   Rolland Porter, MD    Family History Family History  Problem Relation Age of Onset  . Heart disease Mother   . Asthma Mother   . Heart disease Father    Social History Social History  Substance Use Topics  . Smoking status: Current Every Day Smoker    Packs/day: 0.50    Years: 40.00    Types: Cigarettes  . Smokeless tobacco: Never Used  . Alcohol use No    Allergies   Patient  has no known allergies.  Review of Systems Review of Systems  Musculoskeletal: Positive for arthralgias and neck pain.  Neurological: Positive for syncope. Negative for headaches.  All other systems reviewed and are negative.  Physical Exam Updated Vital Signs BP (!) 152/102 (BP Location: Left Arm)   Pulse 84   Temp (!) 97.5 F (36.4 C) (Oral)   Resp 18   Ht 5\' 10"  (1.778 m)   Wt 195 lb (88.5 kg)   SpO2 100%   BMI 27.98 kg/m   Vital signs normal except hypertension (states it gets high when he goes to he doctor).   Physical Exam  Constitutional: He is oriented to person, place, and time. He appears well-developed and well-nourished.  Non-toxic appearance. He does not appear ill. No  distress.  HENT:  Head: Normocephalic and atraumatic.  Right Ear: External ear normal.  Left Ear: External ear normal.  Nose: Nose normal. No mucosal edema or rhinorrhea.  Mouth/Throat: Oropharynx is clear and moist and mucous membranes are normal. No dental abscesses or uvula swelling.  Eyes: Pupils are equal, round, and reactive to light. Conjunctivae and EOM are normal.  Neck: Normal range of motion and full passive range of motion without pain. Neck supple.    Tender over the right paraspinous muscles and the right trapezius muscle.   Cardiovascular: Normal rate, regular rhythm and normal heart sounds.  Exam reveals no gallop and no friction rub.   No murmur heard. Pulmonary/Chest: Effort normal and breath sounds normal. No respiratory distress. He has no wheezes. He has no rhonchi. He has no rales. He exhibits no tenderness and no crepitus.  Abdominal: Soft. Normal appearance and bowel sounds are normal. He exhibits no distension. There is no tenderness. There is no rebound and no guarding.  Musculoskeletal: Normal range of motion. He exhibits no edema or tenderness.  Moves all extremities well.   Neurological: He is alert and oriented to person, place, and time. He has normal strength. No cranial nerve deficit.  Grip strength equal bilaterally. Intact medial, ulnar, and radial nerves. Intact abduction of the right shoulder to 90 degrees.   Skin: Skin is warm, dry and intact. No rash noted. No erythema. No pallor.  Psychiatric: He has a normal mood and affect. His speech is normal and behavior is normal. His mood appears not anxious.  Nursing note and vitals reviewed.  ED Treatments / Results  DIAGNOSTIC STUDIES: Oxygen Saturation is 100% on RA, normal by my interpretation.   Labs (all labs ordered are listed, but only abnormal results are displayed) Results for orders placed or performed during the hospital encounter of 02/14/17  CBC with Differential  Result Value Ref Range     WBC 11.9 (H) 4.0 - 10.5 K/uL   RBC 4.81 4.22 - 5.81 MIL/uL   Hemoglobin 15.4 13.0 - 17.0 g/dL   HCT 42.7 39.0 - 52.0 %   MCV 88.8 78.0 - 100.0 fL   MCH 32.0 26.0 - 34.0 pg   MCHC 36.1 (H) 30.0 - 36.0 g/dL   RDW 13.5 11.5 - 15.5 %   Platelets 223 150 - 400 K/uL   Neutrophils Relative % 52 %   Neutro Abs 6.2 1.7 - 7.7 K/uL   Lymphocytes Relative 33 %   Lymphs Abs 4.0 0.7 - 4.0 K/uL   Monocytes Relative 10 %   Monocytes Absolute 1.1 (H) 0.1 - 1.0 K/uL   Eosinophils Relative 4 %   Eosinophils Absolute 0.5 0.0 - 0.7 K/uL  Basophils Relative 1 %   Basophils Absolute 0.1 0.0 - 0.1 K/uL  Comprehensive metabolic panel  Result Value Ref Range   Sodium 139 135 - 145 mmol/L   Potassium 4.0 3.5 - 5.1 mmol/L   Chloride 104 101 - 111 mmol/L   CO2 26 22 - 32 mmol/L   Glucose, Bld 139 (H) 65 - 99 mg/dL   BUN 10 6 - 20 mg/dL   Creatinine, Ser 0.79 0.61 - 1.24 mg/dL   Calcium 9.7 8.9 - 10.3 mg/dL   Total Protein 7.9 6.5 - 8.1 g/dL   Albumin 4.4 3.5 - 5.0 g/dL   AST 17 15 - 41 U/L   ALT 12 (L) 17 - 63 U/L   Alkaline Phosphatase 97 38 - 126 U/L   Total Bilirubin 0.9 0.3 - 1.2 mg/dL   GFR calc non Af Amer >60 >60 mL/min   GFR calc Af Amer >60 >60 mL/min   Anion gap 9 5 - 15  Glucose, capillary  Result Value Ref Range   Glucose-Capillary 138 (H) 65 - 99 mg/dL  POCT i-Stat troponin I  Result Value Ref Range   Troponin i, poc 0.01 0.00 - 0.08 ng/mL   Comment 3           Laboratory interpretation all normal except hyperglycemia, leukoctytosis    EKG  EKG Interpretation None     ED ECG REPORT   Date: 02/14/2017  Rate: 75  Rhythm: normal sinus rhythm  QRS Axis: normal  Intervals: normal  ST/T Wave abnormalities: normal  Conduction Disutrbances:none  Narrative Interpretation:   Old EKG Reviewed: none available  I have personally reviewed the EKG tracing and agree with the computerized printout as noted.   Radiology Dg Cervical Spine Complete  Result Date:  02/14/2017 CLINICAL DATA:  Right neck pain. Numbness of little, ring and middle finger ears for several weeks. EXAM: CERVICAL SPINE - COMPLETE 4+ VIEW COMPARISON:  None. FINDINGS: No evidence of acute fracture. Mild anterolisthesis of C4 on C5 as likely on a degenerative basis. There is diffuse endplate spurring with disc space narrowing at C5-C6 and C6-C7. Multilevel facet arthropathy. Bony neural foraminal narrowing C4-C5, C5-C6 and C6-C7 bilaterally. The dens is grossly intact. No prevertebral soft tissue edema. IMPRESSION: Multilevel degenerative disc disease and facet arthropathy with bilateral multilevel bony neural foraminal stenosis. Electronically Signed   By: Jeb Levering M.D.   On: 02/14/2017 01:40   Dg Shoulder Right  Result Date: 02/13/2017 CLINICAL DATA:  Syncopal episode while walking to the bathroom after getting up this morning, woke up on bathroom floor on RIGHT side, RIGHT shoulder pain EXAM: RIGHT SHOULDER - 2+ VIEW COMPARISON:  None FINDINGS: Osseous mineralization normal. AC joint alignment normal. Visualized RIGHT ribs intact. No acute fracture, dislocation, or bone destruction. IMPRESSION: No acute osseous abnormalities. Electronically Signed   By: Lavonia Dana M.D.   On: 02/13/2017 21:12    Procedures Procedures (including critical care time)  Medications Ordered in ED Medications  dexamethasone (DECADRON) injection 10 mg (10 mg Intravenous Given 02/14/17 0353)     Initial Impression / Assessment and Plan / ED Course  I have reviewed the triage vital signs and the nursing notes.  Pertinent labs & imaging results that were available during my care of the patient were reviewed by me and considered in my medical decision making (see chart for details).     COORDINATION OF CARE: 1:09 AM-Discussed next steps with pt. Pt verbalized understanding and is agreeable with the  plan.   3:24 AM - Went over xray results and discussed treatment of symptoms with steroids. Will  refer pt to a neurosurgeon.  We discussed while he is on the steroids he needs be extra careful. Diabetic diet. Patient states he is aware of that and he was on medication and had a blood sugar to 400s and now with diet he has well-controlled.  Final Clinical Impressions(s) / ED Diagnoses   Final diagnoses:  Cervical radiculopathy    New Prescriptions New Prescriptions   CYCLOBENZAPRINE (FLEXERIL) 5 MG TABLET    Take 1 tablet (5 mg total) by mouth 3 (three) times daily as needed for muscle spasms.   PREDNISONE (DELTASONE) 20 MG TABLET    Take 3 po QD x 3d , then 2 po QD x 3d then 1 po QD x 3d    Plan discharge  Rolland Porter, MD, Barbette Or, MD 02/14/17 [phone]

## 2017-02-14 NOTE — Discharge Instructions (Signed)
Use ice or heat for comfort. Take the medications as prescribed. I gave you several options to get a doctor to manage your neck problems.

## 2018-06-19 ENCOUNTER — Encounter (HOSPITAL_COMMUNITY): Payer: Self-pay

## 2018-06-19 ENCOUNTER — Inpatient Hospital Stay (HOSPITAL_COMMUNITY)
Admission: EM | Admit: 2018-06-19 | Discharge: 2018-06-27 | DRG: 854 | Disposition: A | Discharge status: Home / Self Care | Payer: 59 | Attending: Orthopedic Surgery | Admitting: Orthopedic Surgery

## 2018-06-19 ENCOUNTER — Other Ambulatory Visit: Payer: Self-pay

## 2018-06-19 ENCOUNTER — Emergency Department (HOSPITAL_COMMUNITY): Payer: 59

## 2018-06-19 DIAGNOSIS — Z87891 Personal history of nicotine dependence: Secondary | ICD-10-CM | POA: Diagnosis not present

## 2018-06-19 DIAGNOSIS — M5126 Other intervertebral disc displacement, lumbar region: Secondary | ICD-10-CM | POA: Diagnosis present

## 2018-06-19 DIAGNOSIS — I998 Other disorder of circulatory system: Secondary | ICD-10-CM | POA: Diagnosis present

## 2018-06-19 DIAGNOSIS — I779 Disorder of arteries and arterioles, unspecified: Secondary | ICD-10-CM | POA: Diagnosis present

## 2018-06-19 DIAGNOSIS — L03115 Cellulitis of right lower limb: Secondary | ICD-10-CM | POA: Diagnosis not present

## 2018-06-19 DIAGNOSIS — E11621 Type 2 diabetes mellitus with foot ulcer: Secondary | ICD-10-CM | POA: Diagnosis present

## 2018-06-19 DIAGNOSIS — Z91048 Other nonmedicinal substance allergy status: Secondary | ICD-10-CM

## 2018-06-19 DIAGNOSIS — L03031 Cellulitis of right toe: Secondary | ICD-10-CM | POA: Diagnosis present

## 2018-06-19 DIAGNOSIS — R739 Hyperglycemia, unspecified: Secondary | ICD-10-CM | POA: Insufficient documentation

## 2018-06-19 DIAGNOSIS — E1152 Type 2 diabetes mellitus with diabetic peripheral angiopathy with gangrene: Secondary | ICD-10-CM | POA: Diagnosis present

## 2018-06-19 DIAGNOSIS — E1151 Type 2 diabetes mellitus with diabetic peripheral angiopathy without gangrene: Secondary | ICD-10-CM | POA: Diagnosis not present

## 2018-06-19 DIAGNOSIS — E1140 Type 2 diabetes mellitus with diabetic neuropathy, unspecified: Secondary | ICD-10-CM | POA: Diagnosis present

## 2018-06-19 DIAGNOSIS — M1990 Unspecified osteoarthritis, unspecified site: Secondary | ICD-10-CM | POA: Diagnosis present

## 2018-06-19 DIAGNOSIS — Z79899 Other long term (current) drug therapy: Secondary | ICD-10-CM

## 2018-06-19 DIAGNOSIS — L97519 Non-pressure chronic ulcer of other part of right foot with unspecified severity: Secondary | ICD-10-CM | POA: Diagnosis present

## 2018-06-19 DIAGNOSIS — I96 Gangrene, not elsewhere classified: Secondary | ICD-10-CM | POA: Diagnosis present

## 2018-06-19 DIAGNOSIS — E119 Type 2 diabetes mellitus without complications: Secondary | ICD-10-CM

## 2018-06-19 DIAGNOSIS — Z9889 Other specified postprocedural states: Secondary | ICD-10-CM

## 2018-06-19 DIAGNOSIS — A419 Sepsis, unspecified organism: Principal | ICD-10-CM | POA: Diagnosis present

## 2018-06-19 DIAGNOSIS — G8929 Other chronic pain: Secondary | ICD-10-CM | POA: Diagnosis present

## 2018-06-19 DIAGNOSIS — E1165 Type 2 diabetes mellitus with hyperglycemia: Secondary | ICD-10-CM | POA: Diagnosis present

## 2018-06-19 DIAGNOSIS — N400 Enlarged prostate without lower urinary tract symptoms: Secondary | ICD-10-CM | POA: Diagnosis present

## 2018-06-19 DIAGNOSIS — M549 Dorsalgia, unspecified: Secondary | ICD-10-CM | POA: Diagnosis present

## 2018-06-19 DIAGNOSIS — Z9119 Patient's noncompliance with other medical treatment and regimen: Secondary | ICD-10-CM

## 2018-06-19 DIAGNOSIS — I70261 Atherosclerosis of native arteries of extremities with gangrene, right leg: Secondary | ICD-10-CM | POA: Diagnosis not present

## 2018-06-19 LAB — COMPREHENSIVE METABOLIC PANEL
ALT: 12 U/L (ref 0–44)
AST: 14 U/L — ABNORMAL LOW (ref 15–41)
Albumin: 3.7 g/dL (ref 3.5–5.0)
Alkaline Phosphatase: 98 U/L (ref 38–126)
Anion gap: 10 (ref 5–15)
BUN: 10 mg/dL (ref 8–23)
CO2: 27 mmol/L (ref 22–32)
Calcium: 9.5 mg/dL (ref 8.9–10.3)
Chloride: 100 mmol/L (ref 98–111)
Creatinine, Ser: 0.72 mg/dL (ref 0.61–1.24)
GFR calc Af Amer: >60 mL/min (ref >60)
GFR calc non Af Amer: >60 mL/min (ref >60)
Glucose, Bld: 205 mg/dL — ABNORMAL HIGH (ref 70–99)
Potassium: 4.2 mmol/L (ref 3.5–5.1)
Sodium: 137 mmol/L (ref 135–145)
Total Bilirubin: 0.7 mg/dL (ref 0.3–1.2)
Total Protein: 7.5 g/dL (ref 6.5–8.1)

## 2018-06-19 LAB — CBC WITH DIFFERENTIAL/PLATELET
Abs Immature Granulocytes: 0.12 10*3/uL — ABNORMAL HIGH (ref 0.00–0.07)
Basophils Absolute: 0.1 10*3/uL (ref 0.0–0.1)
Basophils Relative: 1 %
Eosinophils Absolute: 0.2 10*3/uL (ref 0.0–0.5)
Eosinophils Relative: 1 %
HCT: 45.1 % (ref 39.0–52.0)
Hemoglobin: 14.9 g/dL (ref 13.0–17.0)
Immature Granulocytes: 1 %
Lymphocytes Relative: 17 %
Lymphs Abs: 2.9 10*3/uL (ref 0.7–4.0)
MCH: 31.1 pg (ref 26.0–34.0)
MCHC: 33 g/dL (ref 30.0–36.0)
MCV: 94.2 fL (ref 80.0–100.0)
Monocytes Absolute: 1.2 10*3/uL — ABNORMAL HIGH (ref 0.1–1.0)
Monocytes Relative: 7 %
Neutro Abs: 12.8 10*3/uL — ABNORMAL HIGH (ref 1.7–7.7)
Neutrophils Relative %: 73 %
Platelets: 334 10*3/uL (ref 150–400)
RBC: 4.79 MIL/uL (ref 4.22–5.81)
RDW: 12.7 % (ref 11.5–15.5)
WBC: 17.2 10*3/uL — ABNORMAL HIGH (ref 4.0–10.5)
nRBC: 0 % (ref 0.0–0.2)

## 2018-06-19 LAB — PROCALCITONIN: Procalcitonin: <0.1 ng/mL

## 2018-06-19 LAB — PROTIME-INR
INR: 1.02
Prothrombin Time: 13.3 seconds (ref 11.4–15.2)

## 2018-06-19 LAB — APTT: aPTT: 34 seconds (ref 24–36)

## 2018-06-19 LAB — SEDIMENTATION RATE: Sed Rate: 57 mm/hr — ABNORMAL HIGH (ref 0–16)

## 2018-06-19 LAB — C-REACTIVE PROTEIN: CRP: 10.5 mg/dL — ABNORMAL HIGH (ref <1.0)

## 2018-06-19 LAB — GLUCOSE, CAPILLARY: Glucose-Capillary: 242 mg/dL — ABNORMAL HIGH (ref 70–99)

## 2018-06-19 LAB — I-STAT CG4 LACTIC ACID, ED: Lactic Acid, Venous: 1.86 mmol/L (ref 0.5–1.9)

## 2018-06-19 MED ORDER — ACETAMINOPHEN 650 MG RE SUPP: 650.0000 mg | Freq: Four times a day (QID) | RECTAL | Status: DC | PRN

## 2018-06-19 MED ORDER — PIPERACILLIN-TAZOBACTAM 3.375 G IVPB 30 MIN
3.3750 g | Freq: Once | INTRAVENOUS | Status: DC
  Filled 2018-06-19: qty 50

## 2018-06-19 MED ORDER — ONDANSETRON HCL 4 MG/2ML IJ SOLN
4.0000 mg | Freq: Four times a day (QID) | INTRAMUSCULAR | Status: DC | PRN
  Filled 2018-06-19 (×2): qty 2

## 2018-06-19 MED ORDER — POLYETHYLENE GLYCOL 3350 17 G PO PACK: 17.0000 g | PACK | Freq: Every day | ORAL | Status: DC | PRN

## 2018-06-19 MED ORDER — ACETAMINOPHEN 325 MG PO TABS: 650.0000 mg | ORAL_TABLET | Freq: Four times a day (QID) | ORAL | Status: DC | PRN

## 2018-06-19 MED ORDER — VANCOMYCIN HCL IN DEXTROSE 1-5 GM/200ML-% IV SOLN
1000.0000 mg | Freq: Three times a day (TID) | INTRAVENOUS | Status: DC
  Administered 2018-06-20 – 2018-06-21 (×6): 1000 mg via INTRAVENOUS
  Filled 2018-06-19 (×7): qty 200

## 2018-06-19 MED ORDER — SODIUM CHLORIDE 0.9 % IV SOLN
1.0000 g | INTRAVENOUS | Status: DC
  Administered 2018-06-20 – 2018-06-22 (×3): 1 g via INTRAVENOUS
  Filled 2018-06-19 (×5): qty 10

## 2018-06-19 MED ORDER — SODIUM CHLORIDE 0.9 % IV BOLUS
1000.0000 mL | Freq: Once | INTRAVENOUS | Status: AC
  Administered 2018-06-19: 1000 mL via INTRAVENOUS

## 2018-06-19 MED ORDER — OXYCODONE HCL 5 MG PO TABS
5.0000 mg | ORAL_TABLET | Freq: Once | ORAL | Status: AC
  Administered 2018-06-19: 5 mg via ORAL
  Filled 2018-06-19: qty 1

## 2018-06-19 MED ORDER — INSULIN ASPART 100 UNIT/ML ~~LOC~~ SOLN
0.0000 [IU] | Freq: Every day | SUBCUTANEOUS | Status: DC
  Administered 2018-06-19: 2 [IU] via SUBCUTANEOUS

## 2018-06-19 MED ORDER — PIPERACILLIN-TAZOBACTAM 3.375 G IVPB: 3.3750 g | Freq: Three times a day (TID) | INTRAVENOUS | Status: DC

## 2018-06-19 MED ORDER — INSULIN ASPART 100 UNIT/ML ~~LOC~~ SOLN
0.0000 [IU] | Freq: Three times a day (TID) | SUBCUTANEOUS | Status: DC
  Administered 2018-06-20 – 2018-06-21 (×2): 1 [IU] via SUBCUTANEOUS
  Administered 2018-06-21 – 2018-06-22 (×2): 2 [IU] via SUBCUTANEOUS
  Administered 2018-06-22 – 2018-06-23 (×4): 1 [IU] via SUBCUTANEOUS
  Administered 2018-06-24: 2 [IU] via SUBCUTANEOUS
  Administered 2018-06-25: 3 [IU] via SUBCUTANEOUS

## 2018-06-19 MED ORDER — HEPARIN SODIUM (PORCINE) 5000 UNIT/ML IJ SOLN
5000.0000 [IU] | Freq: Three times a day (TID) | INTRAMUSCULAR | Status: DC
  Administered 2018-06-19 – 2018-06-21 (×5): 5000 [IU] via SUBCUTANEOUS
  Filled 2018-06-19 (×5): qty 1

## 2018-06-19 MED ORDER — SODIUM CHLORIDE 0.9 % IV BOLUS (SEPSIS)
1000.0000 mL | Freq: Once | INTRAVENOUS | Status: AC
  Administered 2018-06-19: 1000 mL via INTRAVENOUS

## 2018-06-19 MED ORDER — SODIUM CHLORIDE 0.9 % IV SOLN
INTRAVENOUS | Status: DC
  Administered 2018-06-19 – 2018-06-21 (×5): via INTRAVENOUS

## 2018-06-19 MED ORDER — VANCOMYCIN HCL IN DEXTROSE 1-5 GM/200ML-% IV SOLN
1000.0000 mg | Freq: Once | INTRAVENOUS | Status: AC
  Administered 2018-06-19: 1000 mg via INTRAVENOUS
  Filled 2018-06-19: qty 200

## 2018-06-19 MED ORDER — SODIUM CHLORIDE 0.9 % IV SOLN
Freq: Once | INTRAVENOUS | Status: AC
  Administered 2018-06-19: 21:00:00 via INTRAVENOUS

## 2018-06-19 MED ORDER — ASPIRIN EC 81 MG PO TBEC
81.0000 mg | DELAYED_RELEASE_TABLET | Freq: Every day | ORAL | Status: DC
  Administered 2018-06-19 – 2018-06-27 (×7): 81 mg via ORAL
  Filled 2018-06-19 (×8): qty 1

## 2018-06-19 MED ORDER — OXYCODONE-ACETAMINOPHEN 5-325 MG PO TABS
1.0000 | ORAL_TABLET | ORAL | Status: DC | PRN
  Administered 2018-06-20 – 2018-06-27 (×34): 1 via ORAL
  Filled 2018-06-19 (×33): qty 1

## 2018-06-19 MED ORDER — MORPHINE SULFATE (PF) 2 MG/ML IV SOLN
2.0000 mg | INTRAVENOUS | Status: DC | PRN
  Administered 2018-06-19 – 2018-06-26 (×7): 2 mg via INTRAVENOUS
  Filled 2018-06-19 (×9): qty 1

## 2018-06-19 MED ORDER — ONDANSETRON HCL 4 MG PO TABS: 4.0000 mg | ORAL_TABLET | Freq: Four times a day (QID) | ORAL | Status: DC | PRN

## 2018-06-19 MED ORDER — ZOLPIDEM TARTRATE 5 MG PO TABS
5.0000 mg | ORAL_TABLET | Freq: Every evening | ORAL | Status: DC | PRN
  Administered 2018-06-20 – 2018-06-25 (×5): 5 mg via ORAL
  Filled 2018-06-19 (×5): qty 1

## 2018-06-19 NOTE — Progress Notes (Addendum)
VASCULAR LAB PRELIMINARY  ARTERIAL  ABI completed: Right ABI indicates moderate to severe decrease in arterial blood flow.  Left ABI indicates a moderate decrease in arterial blood flow.  Right toe pressure is absent.  Left TBI is within normal limits.    RIGHT    LEFT    PRESSURE WAVEFORM  PRESSURE WAVEFORM  BRACHIAL 142 T BRACHIAL 136 T  DP 71 DM DP 83 M  AT   AT    PT 74 DM PT 102 M  PER   PER    GREAT TOE 0 NA GREAT TOE 0.79 NA    RIGHT LEFT  ABI 0.52 0.72     Shawanna Zanders, RVT 06/19/2018, 7:24 PM

## 2018-06-19 NOTE — H&P (Addendum)
History and Physical    [name] JYN:[id number] DOB: 13-Sep-1955 DOA: 06/19/2018  Referring MD/NP/PA:   PCP: Patient, No Pcp Per   Patient coming from:  The patient is coming from home.  At baseline, pt is independent for most of ADL.        Chief Complaint: right foot pain  HPI: [name] is a 62 y.o. male with medical history significant of diabetes mellitus, BPH, herniated lumbar disc, chronic back pain, former smoker (quit smoking 5 days ago), who presents with right foot pain.  Patient reports approximate 10 days ago he sustained a "stone bruise" when he stepped on a rock underneath his right great toe. He developed a large blister in the right great toe a few days later. The right great toe has gradually turned to red, purple, then black.  The right foot has also become erythematous, swelling and tender, which has been progressively spreading up to the lower leg.  Patient states that the pain is constant, moderate, sharp, nonradiating.  He denies fever or chills.  No chest pain, shortness breath, cough.  No nausea vomiting, diarrhea, abdominal pain, symptoms of UTI.  He states that he quit smoking 5 days ago. He states that he does not need nicotine patch.  ED Course: pt was found to have WBC 17.2, lactic acid of 1.86, electrolytes renal function okay, temperature normal, tachycardia, tachypnea initially, oxygen saturation 96% on room air.  X-ray of right foot and right tibia/fibular is negative for bony fracture.  Patient is admitted to [facility] bed as inpatient.  Orthopedic surgeon, Dr. Marcelino Scot was consulted.  # ABI: showed right ABI indicates moderate to severe decrease in arterial blood flow.  Left ABI indicates a moderate decrease in arterial blood flow.  Right toe pressure is absent.  Left TBI is within normal limits.   Review of Systems:   General: no fevers, chills, no body weight gain, has fatigue HEENT: no blurry vision, hearing changes or sore throat Respiratory: no  dyspnea, coughing, wheezing CV: no chest pain, no palpitations GI: no nausea, vomiting, abdominal pain, diarrhea, constipation GU: no dysuria, burning on urination, increased urinary frequency, hematuria  Ext: has right foot pain and swelling Neuro: no unilateral weakness, numbness, or tingling, no vision change or hearing loss Skin: no rash, no skin tear. MSK: No muscle spasm, no deformity, no limitation of range of movement in spin Heme: No easy bruising.  Travel history: No recent long distant travel.  Allergy:  Allergies  Allergen Reactions  . Adhesive [Tape] Other (See Comments)    Causes blisters - pls use paper tape    Past Medical History:  Diagnosis Date  . Arthritis   . Diabetes mellitus without complication ([place])   . Enlarged prostate   . Herniated lumbar intervertebral disc   . Neck pain     Past Surgical History:  Procedure Laterality Date  . ABDOMINAL SURGERY    . open chest      Social History:  reports that he has quit smoking. His smoking use included cigarettes. He has a 20.00 pack-year smoking history. He has never used smokeless tobacco. He reports that he has current or past drug history. Drug: Marijuana. He reports that he does not drink alcohol.  Family History:  Family History  Problem Relation Age of Onset  . Heart disease Mother   . Asthma Mother   . Heart disease Father      Prior to Admission medications   Medication Sig Start Date End  Date Taking? Authorizing Provider  acetaminophen (TYLENOL) 500 MG tablet Take 1,000 mg by mouth every 6 (six) hours as needed for mild pain, moderate pain, fever or headache.    [provider]  aspirin EC 81 MG EC tablet Take 1 tablet (81 mg total) by mouth daily. Patient not taking: Reported on 02/14/2017 07/08/12   Dhungel, Flonnie Overman, MD  cyclobenzaprine (FLEXERIL) 5 MG tablet Take 1 tablet (5 mg total) by mouth 3 (three) times daily as needed for muscle spasms. 02/14/17   Rolland Porter, MD  glipiZIDE  (GLUCOTROL) 10 MG tablet Take 1 tablet (10 mg total) by mouth daily before breakfast. Patient not taking: Reported on 02/14/2017 07/08/12   Dhungel, Flonnie Overman, MD  predniSONE (DELTASONE) 20 MG tablet Take 3 po QD x 3d , then 2 po QD x 3d then 1 po QD x 3d 02/14/17   Rolland Porter, MD    Physical Exam: Vitals:   06/19/18 2054 06/19/18 2200 06/20/18 0130 06/20/18 0512  BP: (!) 141/99 (!) 150/87  129/78  Pulse: 81 83  92  Resp: _0 Temp:  98.3 F (36.8 C)  97.8 F (36.6 C)  TempSrc:  Oral  Oral  SpO2: 97% 100% 95% 99%  Weight:  78.4 kg    Height:  5' 8.11" (1.73 m)     General: Not in acute distress HEENT:       Eyes: PERRL, EOMI, no scleral icterus.       ENT: No discharge from the ears and nose, no pharynx injection, no tonsillar enlargement.        Neck: No JVD, no bruit, no mass felt. Heme: No neck lymph node enlargement. Cardiac: S1/S2, RRR, No murmurs, No gallops or rubs. Respiratory: No rales, wheezing, rhonchi or rubs. GI: Soft, nondistended, nontender, no rebound pain, no organomegaly, BS present. GU: No hematuria Ext: has right lower leg swelling Musculoskeletal: No joint deformities, No joint redness or warmth, no limitation of ROM in spin. Skin: Has dark purple-black colored bullae in right great toe. Has swelling, warmth, erythema and tender in the dorsal foot and lower leg. Difficult to topalpate DP/PT pulse, but per EDP, PT pulse  is identifiable on Doppler.   Neuro: Alert, oriented X3, cranial nerves II-XII grossly intact, moves all extremities normally.  Psych: Patient is not psychotic, no suicidal or hemocidal ideation.  Labs on Admission: I have personally reviewed following labs and imaging studies  CBC: Recent Labs  Lab 06/19/18 1700 06/20/18 0341  WBC 17.2* 11.7*  NEUTROABS 12.8*  --   HGB 14.9 12.6*  HCT 45.1 38.6*  MCV 94.2 93.7  PLT 334 177   Basic Metabolic Panel: Recent Labs  Lab 06/19/18 1700  NA 137  K 4.2  CL 100  CO2 27  GLUCOSE  205*  BUN 10  CREATININE 0.72  CALCIUM 9.5   GFR: Estimated Creatinine Clearance: 90.6 mL/min (by C-G formula based on SCr of 0.72 mg/dL). Liver Function Tests: Recent Labs  Lab 06/19/18 1700  AST 14*  ALT 12  ALKPHOS 98  BILITOT 0.7  PROT 7.5  ALBUMIN 3.7   No results for input(s): LIPASE, AMYLASE in the last 168 hours. No results for input(s): AMMONIA in the last 168 hours. Coagulation Profile: Recent Labs  Lab 06/19/18 2107  INR 1.02   Cardiac Enzymes: No results for input(s): CKTOTAL, CKMB, CKMBINDEX, TROPONINI in the last 168 hours. BNP (last 3 results) No results for input(s): PROBNP in the last 8760 hours. HbA1C:  Recent Labs    06/20/18 0341  HGBA1C 7.8*   CBG: Recent Labs  Lab 06/19/18 2228  GLUCAP 242*   Lipid Profile: No results for input(s): CHOL, HDL, LDLCALC, TRIG, CHOLHDL, LDLDIRECT in the last 72 hours. Thyroid Function Tests: No results for input(s): TSH, T4TOTAL, FREET4, T3FREE, THYROIDAB in the last 72 hours. Anemia Panel: No results for input(s): VITAMINB12, FOLATE, FERRITIN, TIBC, IRON, RETICCTPCT in the last 72 hours. Urine analysis:    Component Value Date/Time   COLORURINE YELLOW 07/07/2012 2114   APPEARANCEUR CLEAR 07/07/2012 2114   LABSPEC 1.010 07/07/2012 2114   PHURINE 5.5 07/07/2012 2114   GLUCOSEU 100 (A) 07/07/2012 2114   HGBUR NEGATIVE 07/07/2012 2114   BILIRUBINUR NEGATIVE 07/07/2012 2114   KETONESUR NEGATIVE 07/07/2012 2114   PROTEINUR NEGATIVE 07/07/2012 2114   UROBILINOGEN 0.2 07/07/2012 2114   NITRITE NEGATIVE 07/07/2012 2114   LEUKOCYTESUR NEGATIVE 07/07/2012 2114   Sepsis Labs: _0 (procalcitonin:4,lacticidven:4) )No results found for this or any previous visit (from the past 240 hour(s)).   Radiological Exams on Admission: Dg Tibia/fibula Right  Result Date: 06/19/2018 CLINICAL DATA:  Right foot and lower leg redness and swelling. EXAM: RIGHT TIBIA AND FIBULA - 2 VIEW COMPARISON:  Right foot  radiographs obtained at the same time. FINDINGS: Mild patellofemoral spur formation. Previously described degenerative spurs involving the talotibial joint and calcaneus. No soft tissue gas, bone destruction or periosteal reaction. IMPRESSION: No acute abnormality. Degenerative changes. Electronically Signed   By: Claudie Revering M.D.   On: 06/19/2018 19:21   Dg Foot Complete Right  Result Date: 06/19/2018 CLINICAL DATA:  Right foot pain, redness and swelling. No known injury. Clinical concern for infection EXAM: RIGHT FOOT COMPLETE - 3+ VIEW COMPARISON:  None. FINDINGS: Mild to moderate inferior calcaneal spur formation. Mild to moderate anterior talotibial spur formation. No visible soft tissue swelling, soft tissue gas, bone destruction or periosteal reaction. IMPRESSION: No acute abnormality.  Degenerative changes, as described above. Electronically Signed   By: Claudie Revering M.D.   On: 06/19/2018 18:18     EKG:  Not done in ED, will get one.   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  Lactic acid is normal.  Currently hemodynamically stable.  Patient has peripheral vascular disease as shown by ABI.  Orthopedic surgeon, Dr. Ginette Pitman was consulted.  - will admit to med-surg bed as inpt - Empiric antimicrobial treatment with vancomycin and Rocephin (patient received 1 dose of Zosyn in ED). - PRN Zofran for nausea, and Percocet for pain - Blood cultures x 2  - ESR and CRP - will get Procalcitonin and trend lactic acid levels per sepsis protocol. - IVF: 2 L of NS bolus in ED, followed by 125 cc/h  Diabetes mellitus with peripheral  vascular disease ([place]): Last A1c 8.5on 07/08/12, poorly controled. Patient is taking glipizide at home -SSI -A1c  Back pain due to herniated lumbar disc disease: -PRN Percocet  Former smoker: Patient quit smoking, does not need nicotine patch - Encouraged the patient never restart smoking again   Inpatient status:  # Patient requires inpatient status due to high intensity of service, high risk for further  deterioration and high frequency of surveillance required.  I certify that at the point of admission it is my clinical judgment that the patient will require inpatient hospital care spanning beyond 2 midnights from the point of admission.  . This patient has multiple chronic comorbidities including diabetes mellitus, BPH, herniated lumbar disc, chronic back pain, former smoker (quit smoking 5 days ago). . Now patient has presenting symptoms include right foot pain, swelling and infection, gangrenous right great toe . The worrisome physical exam findings include erythema, swelling, tenderness in the right foot, and the lower leg.  Also has gangrene of right great toe. . The initial radiographic and laboratory data are worrisome because of leukocytosis, sepsis, peripheral vascular disease by ABI. [name] Kitchen Current medical needs: please see my assessment and plan . Predictability of an adverse outcome (risk): Patient has sepsis due to right lower leg cellulitis.  He also has gangrene of right great toe, which likely needs amputation.  Patient is at the high risk of deteriorating.   DVT ppx: SQ Heparin Code Status: Full code Family Communication: None at bed side.  Disposition Plan:  Anticipate discharge back to previous home environment Consults called:  Dr. Marcelino Scot of ortho Admission status: medical floor/inpt   Date of Service 06/20/2018    Ivor Costa [facility] Pager [phone]  If 7PM-7AM, please contact night-coverage [email or web address] Password [email or web address] 06/20/2018, 5:26 AM

## 2018-06-19 NOTE — Progress Notes (Signed)
Pharmacy Antibiotic Note  [name] is a 62 y.o. male admitted on 06/19/2018 with sepsis.    Plan: Zosyn 3.375 gm iv q8h Vanc 1 g q8  Monitor renal fx cx vt prn   Height: 5\' 10"  (177.8 cm) Weight: 177 lb (80.3 kg) IBW/kg (Calculated) : 73  Temp (24hrs), Avg:97.8 F (36.6 C), Min:97.8 F (36.6 C), Max:97.8 F (36.6 C)  Recent Labs  Lab 06/19/18 1700 06/19/18 1805  WBC 17.2*  --   CREATININE 0.72  --   LATICACIDVEN  --  1.86    Estimated Creatinine Clearance: 96.3 mL/min (by C-G formula based on SCr of 0.72 mg/dL).    No Known Allergies  Levester Fresh, PharmD, BCPS, BCCCP Clinical Pharmacist [phone]  Please check AMION for all [place] numbers  06/19/2018 6:44 PM

## 2018-06-19 NOTE — ED Notes (Signed)
Patient transported to X-ray 

## 2018-06-19 NOTE — ED Notes (Signed)
Doppler study in progress

## 2018-06-19 NOTE — ED Triage Notes (Signed)
Pt from home c/o right foot pain. Pt endorses history of diabetes and former smoker- states he quit 5 days ago d/t each time he smoked he noticed it "made his foot feel funny". Pt presents with redness and swelling to right foot. Pt states he stepped on a rock about 10 days ago, and the wound on his big toe has gotten increasingly worse since. Redness noted to go halfway up patient right leg. Pt states he is able to bear weight on the foot but with some difficulty.

## 2018-06-19 NOTE — ED Notes (Signed)
Both sets of Cultures drawn prior to abx start time.

## 2018-06-19 NOTE — ED Provider Notes (Signed)
[facility] EMERGENCY DEPARTMENT Provider Note   CSN: [id number] Arrival date & time: 06/19/18  1624     History   Chief Complaint Chief Complaint  Patient presents with  . Toe Pain    HPI [name] is a 62 y.o. male.  HPI   Patient is a 62 year old male with a history of type 2 diabetes mellitus, not currently on anti-hyperglycemics, presenting for erythema, pain, and swelling of his right lower extremity.  Patient reports approximate 10 days ago he sustained a "stone bruise" when he stepped on a rock underneath his right great toe.  Patient reports that he has minimal sensation in his distal lower extremities.  He reports that over the past 3 days, he noticed a large blister forming over the top of his right great toe.  He noticed progressive erythema over the dorsum of the foot and spreading up his leg.  He reports that this became progressively more painful.  He denies any fevers, chills, nausea, vomiting, chest pain, shortness of breath.  Patient denies having follow-up with her primary care provider for several years.  Patient is a current smoker.  Patient reports that he has had a tetanus shot within the last couple years.  Past Medical History:  Diagnosis Date  . Arthritis   . Diabetes mellitus without complication ([place])   . Enlarged prostate   . Herniated lumbar intervertebral disc   . Neck pain     Patient Active Problem List   Diagnosis Date Noted  . Dehydration 07/07/2012  . Syncope 07/07/2012  . Hypotension 07/07/2012  . Leukocytosis 07/07/2012  . Hyperkalemia 07/07/2012    Past Surgical History:  Procedure Laterality Date  . ABDOMINAL SURGERY    . open chest          Home Medications    Prior to Admission medications   Medication Sig Start Date End Date Taking? Authorizing Provider  acetaminophen (TYLENOL) 500 MG tablet Take 1,000 mg by mouth every 6 (six) hours as needed for mild pain, moderate pain, fever or headache.     [provider]  aspirin EC 81 MG EC tablet Take 1 tablet (81 mg total) by mouth daily. Patient not taking: Reported on 02/14/2017 07/08/12   Dhungel, Flonnie Overman, MD  cyclobenzaprine (FLEXERIL) 5 MG tablet Take 1 tablet (5 mg total) by mouth 3 (three) times daily as needed for muscle spasms. 02/14/17   Rolland Porter, MD  glipiZIDE (GLUCOTROL) 10 MG tablet Take 1 tablet (10 mg total) by mouth daily before breakfast. Patient not taking: Reported on 02/14/2017 07/08/12   Dhungel, Flonnie Overman, MD  predniSONE (DELTASONE) 20 MG tablet Take 3 po QD x 3d , then 2 po QD x 3d then 1 po QD x 3d 02/14/17   Rolland Porter, MD    Family History Family History  Problem Relation Age of Onset  . Heart disease Mother   . Asthma Mother   . Heart disease Father     Social History Social History   Tobacco Use  . Smoking status: Current Every Day Smoker    Packs/day: 0.50    Years: 40.00    Pack years: 20.00    Types: Cigarettes  . Smokeless tobacco: Never Used  Substance Use Topics  . Alcohol use: No  . Drug use: Yes    Types: Marijuana     Allergies   Patient has no known allergies.   Review of Systems Review of Systems  Constitutional: Negative for chills and fever.  HENT: Negative for congestion and rhinorrhea.   Respiratory: Negative for chest tightness and shortness of breath.   Cardiovascular: Positive for leg swelling. Negative for chest pain.  Gastrointestinal: Negative for abdominal pain, nausea and vomiting.  Skin: Positive for color change and wound.  Neurological: Positive for numbness. Negative for weakness.  All other systems reviewed and are negative.    Physical Exam Updated Vital Signs BP (!) 148/91 (BP Location: Right Arm)   Pulse (!) 106   Temp 97.8 F (36.6 C) (Oral)   Resp 16   Ht 5\' 10"  (1.778 m)   Wt 80.3 kg   SpO2 100%   BMI 25.40 kg/m   Physical Exam  Constitutional: He appears well-developed and well-nourished. No distress.  HENT:  Head: Normocephalic and  atraumatic.  Mouth/Throat: Oropharynx is clear and moist.  Eyes: Pupils are equal, round, and reactive to light. Conjunctivae and EOM are normal.  Neck: Normal range of motion. Neck supple.  Cardiovascular: Normal rate, regular rhythm, S1 normal and S2 normal.  No murmur heard. Pulmonary/Chest: Effort normal and breath sounds normal. He has no wheezes. He has no rales.  Abdominal: Soft. He exhibits no distension. There is no tenderness. There is no guarding.  Musculoskeletal: Normal range of motion. He exhibits edema and deformity.  See clinical photo for details.  Patient has a necrotic appearing right great toe with bullae formation over the dorsum.  It is insensate.  Patient has soft tissue swelling and erythema over the dorsum of the foot extending in the pretibial region. Patient has a PT pulse that is identifiable on Doppler with turbulent flow auscultated, but no easily auscultated PT pulse.  Neurological: He is alert.  Cranial nerves grossly intact. Patient moves extremities symmetrically and with good coordination.  Skin: Skin is warm and dry. No rash noted. No erythema.  Psychiatric: He has a normal mood and affect. His behavior is normal. Judgment and thought content normal.  Nursing note and vitals reviewed.      ED Treatments / Results  Labs (all labs ordered are listed, but only abnormal results are displayed) Labs Reviewed  CULTURE, BLOOD (ROUTINE X 2)  CULTURE, BLOOD (ROUTINE X 2)  COMPREHENSIVE METABOLIC PANEL  CBC WITH DIFFERENTIAL/PLATELET  I-STAT CG4 LACTIC ACID, ED    EKG None  Radiology No results found.  Procedures Procedures (including critical care time)  Medications Ordered in ED Medications  sodium chloride 0.9 % bolus 1,000 mL (has no administration in time range)     Initial Impression / Assessment and Plan / ED Course  I have reviewed the triage vital signs and the nursing notes.  Pertinent labs & imaging results that were available  during my care of the patient were reviewed by me and considered in my medical decision making (see chart for details).  Clinical Course as of Jun 20 2015  Sat Jun 19, 2018  1916 Left sided severe PAD (0.5 ABI) and negative toe pressure. Notified by technician.   [AM]  2016 Spoke with Dr. Blaine Hamper who will admit patient. I appreciate his involvement in the care of this patient.    [AM]    Clinical Course User Index [AM] Albesa Seen, PA-C    Patient is nontoxic-appearing, afebrile, and hemodynamically stable.  Patient with obvious gangrenous infection of the right great toe.  Patient has evidence of soft tissue infection associated.  Patient was started on broad-spectrum antibiotics, and cultures are pending.  Case discussed with Dr. Rex Kras, and patient deemed not  code sepsis based on vitals and labs.  Fluid bolus initiated.   Patient is a leukocytosis of 17.2 with left shift.  Normal renal function.  Lactic acid is normal.  Radiographs of the area demonstrates no soft tissue gas.  Spoke with Dr. Marcelino Scot of orthopedics, who states that orthopedic will run on patient tomorrow.  We will continue broad-spectrum antibiotics overnight.  Final Clinical Impressions(s) / ED Diagnoses   Final diagnoses:  Gangrene of toe [facility])  Hyperglycemia    ED Discharge Orders    None       Tamala Julian 06/19/18 2016    Little, Wenda Overland, MD 06/22/18 1939

## 2018-06-19 NOTE — ED Notes (Signed)
Patient given bag meal with drink.

## 2018-06-19 NOTE — ED Notes (Signed)
Got patient undress into a gown on the monitor patient is resting with call bell in reach and nurse at bedside 

## 2018-06-19 NOTE — Consult Note (Signed)
Full consultation to follow. In brief, [name] has been noncompliant with all recommendations related to control of his diabetes. He thinks that he injured his toe 10 days ago. He continues to smoke. His left great toe has turned black and is clearly demarcated.   I have recommended IV abx, IM consultation for sugar control, and ABI's. Eventual amputation is anticipated, but any wound would need to be capable of healing. I have contacted my colleague, Dr. Meridee Score, given his expertise in management of these conditions.  Altamese Nedrow, MD [facility], [facility] [phone]

## 2018-06-20 ENCOUNTER — Other Ambulatory Visit: Payer: Self-pay

## 2018-06-20 LAB — HEMOGLOBIN A1C
Hgb A1c MFr Bld: 7.8 % — ABNORMAL HIGH (ref 4.8–5.6)
Mean Plasma Glucose: 177.16 mg/dL

## 2018-06-20 LAB — BASIC METABOLIC PANEL
Anion gap: 8 (ref 5–15)
BUN: 7 mg/dL — ABNORMAL LOW (ref 8–23)
CO2: 26 mmol/L (ref 22–32)
Calcium: 8.5 mg/dL — ABNORMAL LOW (ref 8.9–10.3)
Chloride: 105 mmol/L (ref 98–111)
Creatinine, Ser: 0.8 mg/dL (ref 0.61–1.24)
GFR calc Af Amer: >60 mL/min (ref >60)
GFR calc non Af Amer: >60 mL/min (ref >60)
Glucose, Bld: 115 mg/dL — ABNORMAL HIGH (ref 70–99)
Potassium: 3.8 mmol/L (ref 3.5–5.1)
Sodium: 139 mmol/L (ref 135–145)

## 2018-06-20 LAB — CBC
HCT: 38.6 % — ABNORMAL LOW (ref 39.0–52.0)
Hemoglobin: 12.6 g/dL — ABNORMAL LOW (ref 13.0–17.0)
MCH: 30.6 pg (ref 26.0–34.0)
MCHC: 32.6 g/dL (ref 30.0–36.0)
MCV: 93.7 fL (ref 80.0–100.0)
Platelets: 296 10*3/uL (ref 150–400)
RBC: 4.12 MIL/uL — ABNORMAL LOW (ref 4.22–5.81)
RDW: 12.7 % (ref 11.5–15.5)
WBC: 11.7 10*3/uL — ABNORMAL HIGH (ref 4.0–10.5)
nRBC: 0 % (ref 0.0–0.2)

## 2018-06-20 LAB — GLUCOSE, CAPILLARY
Glucose-Capillary: 125 mg/dL — ABNORMAL HIGH (ref 70–99)
Glucose-Capillary: 166 mg/dL — ABNORMAL HIGH (ref 70–99)
Glucose-Capillary: 133 mg/dL — ABNORMAL HIGH (ref 70–99)
Glucose-Capillary: 160 mg/dL — ABNORMAL HIGH (ref 70–99)

## 2018-06-20 LAB — HIV ANTIBODY (ROUTINE TESTING W REFLEX): HIV Screen 4th Generation wRfx: NONREACTIVE

## 2018-06-20 NOTE — Progress Notes (Signed)
EKG complete and performed by Daye, NT. Rhythm NSR, rate of 73 bpm.

## 2018-06-20 NOTE — Plan of Care (Signed)

## 2018-06-20 NOTE — Progress Notes (Addendum)
PROGRESS NOTE    Moss Berry  QMG:[id number] DOB: 04-16-1956 DOA: 06/19/2018 PCP: Patient, No Pcp Per  Outpatient Specialists:   Brief Narrative:  Dyshaun Bonzo is a 62 year old male with medical history significant for diabetes , BPH herniated disks with chronic back pain current smoker but quit  5 days ago he is being admitted for right foot ulcer with gangrene.  He thought that he may have injured it about 10 days ago when he stepped on a rock after which she developed a large blister on the right great toe.  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  Right foot ulcer with gangrene.  Orthopedic has been consulted and they have evaluated patient and his ABI showed moderate to severe decrease in atrial blood flow on the right same on the left will there is no pressure in the right great toe but there is pressure in the left which was normal 2.  Diabetes mellitus uncontrolled her last A1c was 8.5 patient is on glipizide we will treat with sliding scale insulin 3.  Sepsis due to cellulitis and gangrene of the right great toe.  His leukocyte was initially 17.1 on admission today it is 11.7 he is responding to the antibiotics.  We will continue Zosyn that was started in the ED as well as vancomycin and Rocephin.  Blood cultures are still pending we will continue supportive care with normal saline at 125 mils per hour 4.  Chronic low back pain with herniated disc continue pain management with Percocet as needed 5.  tobacco abuse recent quitting.  Patient encouraged to stay quit    DVT ppx: SQ Heparin Code Status: Full code Family Communication: None at bed side.  Disposition Plan:  Anticipate discharge back to previous home environment Consults called:  Dr. Marcelino Scot of  ortho   Procedures:   None  Antimicrobials:  Vancomycin  Rocephin  Subjective: Patient seen at bedside denies any complaint except that he is tired because he did not have much sleep last night due to pain in his leg or foot.  Alert and oriented x3  Objective: Vitals:   06/20/18 0130 06/20/18 0512 06/20/18 0824 06/20/18 1615  BP:  129/78 127/84 131/85  Pulse:  92 80 72  Resp:  19 18 17   Temp:  97.8 F (36.6 C) 97.7 F (36.5 C) 98.1 F (36.7 C)  TempSrc:  Oral Oral Oral  SpO2: 95% 99% 97% 99%  Weight:      Height:        Intake/Output Summary (Last 24 hours) at 06/20/2018 1637 Last data filed at 06/20/2018 1625 Gross per 24 hour  Intake 1930 ml  Output 1625 ml  Net 305 ml   Filed Weights   06/19/18 1640 06/19/18 2200  Weight: 80.3 kg 78.4 kg    Examination:  General exam: Appears calm and comfortable  Respiratory system: Clear to auscultation. Respiratory effort normal. Cardiovascular system: S1 & S2 heard, RRR. No JVD, murmurs, rubs, gallops or clicks. No pedal edema. Gastrointestinal system: Abdomen is nondistended, soft and nontender. No organomegaly or masses felt. Normal bowel sounds heard. Central nervous system: Alert and oriented. No focal neurological deficits. Extremities: Moves all 4 extremity .  He does have a dark palpable black-colored bullae in  the right great toe which is swollen and warm and erythematous and tender to touch in the dorsal area.  Difficult to palpate distal pulses Skin: No rashes, lesions or ulcers Psychiatry: Judgement and insight appear normal. Mood & affect appropriate.     Data Reviewed: I have personally reviewed following labs and imaging studies  CBC: Recent Labs  Lab 06/19/18 1700 06/20/18 0341  WBC 17.2* 11.7*  NEUTROABS 12.8*  --   HGB 14.9 12.6*  HCT 45.1 38.6*  MCV 94.2 93.7  PLT 334 297   Basic Metabolic Panel: Recent Labs  Lab 06/19/18 1700 06/20/18 0341  NA 137 139  K 4.2 3.8  CL 100 105  CO2 27  26  GLUCOSE 205* 115*  BUN 10 7*  CREATININE 0.72 0.80  CALCIUM 9.5 8.5*   GFR: Estimated Creatinine Clearance: 90.6 mL/min (by C-G formula based on SCr of 0.8 mg/dL). Liver Function Tests: Recent Labs  Lab 06/19/18 1700  AST 14*  ALT 12  ALKPHOS 98  BILITOT 0.7  PROT 7.5  ALBUMIN 3.7   No results for input(s): LIPASE, AMYLASE in the last 168 hours. No results for input(s): AMMONIA in the last 168 hours. Coagulation Profile: Recent Labs  Lab 06/19/18 2107  INR 1.02   Cardiac Enzymes: No results for input(s): CKTOTAL, CKMB, CKMBINDEX, TROPONINI in the last 168 hours. BNP (last 3 results) No results for input(s): PROBNP in the last 8760 hours. HbA1C: Recent Labs    06/20/18 0341  HGBA1C 7.8*   CBG: Recent Labs  Lab 06/19/18 2228 06/20/18 0637 06/20/18 1151 06/20/18 1558  GLUCAP 242* 125* 166* 133*   Lipid Profile: No results for input(s): CHOL, HDL, LDLCALC, TRIG, CHOLHDL, LDLDIRECT in the last 72 hours. Thyroid Function Tests: No results for input(s): TSH, T4TOTAL, FREET4, T3FREE, THYROIDAB in the last 72 hours. Anemia Panel: No results for input(s): VITAMINB12, FOLATE, FERRITIN, TIBC, IRON, RETICCTPCT in the last 72 hours. Urine analysis:    Component Value Date/Time   COLORURINE YELLOW 07/07/2012 2114   APPEARANCEUR CLEAR 07/07/2012 2114   LABSPEC 1.010 07/07/2012 2114   PHURINE 5.5 07/07/2012 2114   GLUCOSEU 100 (A) 07/07/2012 2114   HGBUR NEGATIVE 07/07/2012 2114   BILIRUBINUR NEGATIVE 07/07/2012 2114   KETONESUR NEGATIVE 07/07/2012 2114   PROTEINUR NEGATIVE 07/07/2012 2114   UROBILINOGEN 0.2 07/07/2012 2114   NITRITE NEGATIVE 07/07/2012 2114   LEUKOCYTESUR NEGATIVE 07/07/2012 2114   Sepsis Labs: @LABRCNTIP (procalcitonin:4,lacticidven:4)  ) Recent Results (from the past 240 hour(s))  Blood Culture (routine x 2)     Status: None (Preliminary result)   Collection Time: 06/19/18  5:00 PM  Result Value Ref Range Status   Specimen Description  BLOOD LEFT ANTECUBITAL  Final   Special Requests   Final    BOTTLES DRAWN AEROBIC AND ANAEROBIC Blood Culture adequate volume   Culture   Final    NO GROWTH < 24 HOURS Performed at [facility], [address], [place],  [address]    Report Status PENDING  Incomplete  Blood Culture (routine x 2)     Status: None (Preliminary result)   Collection Time: 06/19/18  5:30 PM  Result Value Ref Range Status   Specimen Description BLOOD LEFT HAND  Final   Special Requests   Final    BOTTLES DRAWN AEROBIC ONLY Blood Culture results may not be optimal due to an inadequate volume of blood received in culture bottles   Culture   Final    NO GROWTH <  24 HOURS Performed at [facility], [place] [address] [place], [place] [address]    Report Status PENDING  Incomplete         Radiology Studies: Dg Tibia/fibula Right  Result Date: 06/19/2018 CLINICAL DATA:  Right foot and lower leg redness and swelling. EXAM: RIGHT TIBIA AND FIBULA - 2 VIEW COMPARISON:  Right foot radiographs obtained at the same time. FINDINGS: Mild patellofemoral spur formation. Previously described degenerative spurs involving the talotibial joint and calcaneus. No soft tissue gas, bone destruction or periosteal reaction. IMPRESSION: No acute abnormality. Degenerative changes. Electronically Signed   By: Claudie Revering M.D.   On: 06/19/2018 19:21   Dg Foot Complete Right  Result Date: 06/19/2018 CLINICAL DATA:  Right foot pain, redness and swelling. No known injury. Clinical concern for infection EXAM: RIGHT FOOT COMPLETE - 3+ VIEW COMPARISON:  None. FINDINGS: Mild to moderate inferior calcaneal spur formation. Mild to moderate anterior talotibial spur formation. No visible soft tissue swelling, soft tissue gas, bone destruction or periosteal reaction. IMPRESSION: No acute abnormality.  Degenerative changes, as described above. Electronically Signed   By: Claudie Revering M.D.   On: 06/19/2018 18:18   Vas [place] Burnard Bunting  With/wo Tbi  Result Date: 06/20/2018 LOWER EXTREMITY DOPPLER STUDY Indications: Gangrene. High Risk Factors: Diabetes, current smoker.  Comparison Study: No prior study on file Performing Technologist: Sharion Dove RVS  Examination Guidelines: A complete evaluation includes at minimum, Doppler waveform signals and systolic blood pressure reading at the level of bilateral brachial, anterior tibial, and posterior tibial arteries, when vessel segments are accessible. Bilateral testing is considered an integral part of a complete examination. Photoelectric Plethysmograph (PPG) waveforms and toe systolic pressure readings are included as required and additional duplex testing as needed. Limited examinations for reoccurring indications may be performed as noted.  ABI Findings: +---------+------------------+-----+-------------------+--------+ Right    Rt Pressure (mmHg)IndexWaveform           Comment  +---------+------------------+-----+-------------------+--------+ Brachial 142                    triphasic                   +---------+------------------+-----+-------------------+--------+ PTA      74                0.52 dampened monophasic         +---------+------------------+-----+-------------------+--------+ DP       71                0.50 dampened monophasic         +---------+------------------+-----+-------------------+--------+ Great Toe0                 0.00                             +---------+------------------+-----+-------------------+--------+ +---------+------------------+-----+----------+-------+ Left     Lt Pressure (mmHg)IndexWaveform  Comment +---------+------------------+-----+----------+-------+ Brachial 136                    triphasic         +---------+------------------+-----+----------+-------+ PTA      102               0.72 monophasic        +---------+------------------+-----+----------+-------+ DP       83                0.58  monophasic        +---------+------------------+-----+----------+-------+  Great [id number]               0.79                   +---------+------------------+-----+----------+-------+ +-------+-----------+-----------+------------+------------+ ABI/TBIToday's ABIToday's TBIPrevious ABIPrevious TBI +-------+-----------+-----------+------------+------------+ Right  0.52       0                                   +-------+-----------+-----------+------------+------------+ Left   0.72       0.79                                +-------+-----------+-----------+------------+------------+  Summary: Right: Resting right ankle-brachial index indicates moderate right lower extremity arterial disease. The right toe-brachial index is abnormal. Left: Resting left ankle-brachial index indicates moderate left lower extremity arterial disease. The left toe-brachial index is normal.  *See table(s) above for measurements and observations.  Electronically signed by Ruta Hinds MD on 06/20/2018 at 9:13:48 AM.    Final         Scheduled Meds: . aspirin EC  81 mg Oral Daily  . heparin  5,000 Units Subcutaneous Q8H  . insulin aspart  0-5 Units Subcutaneous QHS  . insulin aspart  0-9 Units Subcutaneous TID WC   Continuous Infusions: . sodium chloride 125 mL/hr at 06/20/18 1540  . cefTRIAXone (ROCEPHIN)  IV 1 g (06/20/18 2103)  . vancomycin 1,000 mg (06/20/18 1058)     LOS: 1 day    Time spent: 25 minutes    Cristal Deer, MD [facility] Pager [phone]  If 7PM-7AM, please contact night-coverage [email or web address] Password [facility] 06/20/2018, 4:37 PM

## 2018-06-20 NOTE — Progress Notes (Signed)
Pt educated and agreed to take insulin.

## 2018-06-20 NOTE — Progress Notes (Signed)
Talked to Dr. Sharol Given over the phone, Pt will not be seen by him until Monday morning. Diet advanced to carb modified. Informed MD of Pt's refusal of insulin.

## 2018-06-20 NOTE — Plan of Care (Signed)
  Problem: Education: Goal: Knowledge of General Education information will improve Description Including pain rating scale, medication(s)/side effects and non-pharmacologic comfort measures 06/20/2018 0338 by Jackson Latino, RN Outcome: Progressing 06/20/2018 0337 by Jackson Latino, RN Outcome: Progressing   Problem: Health Behavior/Discharge Planning: Goal: Ability to manage health-related needs will improve Outcome: Progressing   Problem: Clinical Measurements: Goal: Ability to maintain clinical measurements within normal limits will improve 06/20/2018 0338 by Jackson Latino, RN Outcome: Progressing 06/20/2018 0337 by Jackson Latino, RN Outcome: Progressing Goal: Will remain free from infection 06/20/2018 0338 by Jackson Latino, RN Outcome: Progressing 06/20/2018 0337 by Jackson Latino, RN Outcome: Progressing Goal: Diagnostic test results will improve 06/20/2018 0338 by Jackson Latino, RN Outcome: Progressing 06/20/2018 0337 by Jackson Latino, RN Outcome: Progressing Goal: Respiratory complications will improve 06/20/2018 0338 by Jackson Latino, RN Outcome: Progressing 06/20/2018 0337 by Jackson Latino, RN Outcome: Progressing Goal: Cardiovascular complication will be avoided 06/20/2018 0338 by Jackson Latino, RN Outcome: Progressing 06/20/2018 0337 by Jackson Latino, RN Outcome: Progressing   Problem: Activity: Goal: Risk for activity intolerance will decrease 06/20/2018 0338 by Jackson Latino, RN Outcome: Progressing 06/20/2018 0337 by Jackson Latino, RN Outcome: Progressing   Problem: Nutrition: Goal: Adequate nutrition will be maintained 06/20/2018 0338 by Jackson Latino, RN Outcome: Progressing 06/20/2018 0337 by Jackson Latino, RN Outcome: Progressing   Problem: Coping: Goal: Level of anxiety will decrease 06/20/2018 0338 by Jackson Latino, RN Outcome: Progressing 06/20/2018 0337 by Jackson Latino, RN Outcome: Progressing   Problem:  Elimination: Goal: Will not experience complications related to bowel motility 06/20/2018 0338 by Jackson Latino, RN Outcome: Progressing 06/20/2018 0337 by Jackson Latino, RN Outcome: Progressing Goal: Will not experience complications related to urinary retention 06/20/2018 0338 by Jackson Latino, RN Outcome: Progressing 06/20/2018 0337 by Jackson Latino, RN Outcome: Progressing   Problem: Pain Managment: Goal: General experience of comfort will improve 06/20/2018 0338 by Jackson Latino, RN Outcome: Progressing 06/20/2018 0337 by Jackson Latino, RN Outcome: Progressing   Problem: Safety: Goal: Ability to remain free from injury will improve Outcome: Progressing   Problem: Skin Integrity: Goal: Risk for impaired skin integrity will decrease Outcome: Progressing

## 2018-06-20 NOTE — Progress Notes (Signed)
CBG checked as scheduled. Informed/educated Pt about the result and sliding scale coverage but Pt refused to take insulin, he stated that he does not need insulin and knows his body.

## 2018-06-21 ENCOUNTER — Encounter (HOSPITAL_COMMUNITY)
Admission: EM | Disposition: A | Discharge status: Home / Self Care | Payer: Self-pay | Source: Home / Self Care | Attending: Internal Medicine

## 2018-06-21 DIAGNOSIS — I70261 Atherosclerosis of native arteries of extremities with gangrene, right leg: Secondary | ICD-10-CM

## 2018-06-21 DIAGNOSIS — L03115 Cellulitis of right lower limb: Secondary | ICD-10-CM

## 2018-06-21 DIAGNOSIS — E1151 Type 2 diabetes mellitus with diabetic peripheral angiopathy without gangrene: Secondary | ICD-10-CM

## 2018-06-21 DIAGNOSIS — I96 Gangrene, not elsewhere classified: Secondary | ICD-10-CM

## 2018-06-21 HISTORY — PX: PERIPHERAL VASCULAR ATHERECTOMY: CATH118256

## 2018-06-21 HISTORY — PX: LOWER EXTREMITY ANGIOGRAPHY: CATH118251

## 2018-06-21 LAB — GLUCOSE, CAPILLARY
Glucose-Capillary: 146 mg/dL — ABNORMAL HIGH (ref 70–99)
Glucose-Capillary: 190 mg/dL — ABNORMAL HIGH (ref 70–99)
Glucose-Capillary: 119 mg/dL — ABNORMAL HIGH (ref 70–99)
Glucose-Capillary: 138 mg/dL — ABNORMAL HIGH (ref 70–99)

## 2018-06-21 LAB — SURGICAL PCR SCREEN
MRSA, PCR: NEGATIVE
Staphylococcus aureus: NEGATIVE

## 2018-06-21 LAB — VANCOMYCIN, TROUGH: Vancomycin Tr: 14 ug/mL — ABNORMAL LOW (ref 15–20)

## 2018-06-21 SURGERY — LOWER EXTREMITY ANGIOGRAPHY
Anesthesia: LOCAL | Laterality: Right

## 2018-06-21 MED ORDER — CLOPIDOGREL BISULFATE 300 MG PO TABS
ORAL_TABLET | ORAL | Status: AC
  Filled 2018-06-21: qty 1

## 2018-06-21 MED ORDER — HYDRALAZINE HCL 20 MG/ML IJ SOLN: 5.0000 mg | INTRAMUSCULAR | Status: DC | PRN

## 2018-06-21 MED ORDER — NITROGLYCERIN 1 MG/10 ML FOR IR/CATH LAB
INTRA_ARTERIAL | Status: DC | PRN
  Administered 2018-06-21: 200 ug via INTRA_ARTERIAL

## 2018-06-21 MED ORDER — LIDOCAINE HCL (PF) 1 % IJ SOLN
INTRAMUSCULAR | Status: DC | PRN
  Administered 2018-06-21: 20 mL

## 2018-06-21 MED ORDER — ROSUVASTATIN CALCIUM 10 MG PO TABS
10.0000 mg | ORAL_TABLET | Freq: Every day | ORAL | Status: DC
  Administered 2018-06-21 – 2018-06-26 (×6): 10 mg via ORAL
  Filled 2018-06-21 (×6): qty 1

## 2018-06-21 MED ORDER — FENTANYL CITRATE (PF) 100 MCG/2ML IJ SOLN
INTRAMUSCULAR | Status: AC
  Filled 2018-06-21: qty 2

## 2018-06-21 MED ORDER — HEPARIN SODIUM (PORCINE) 5000 UNIT/ML IJ SOLN
5000.0000 [IU] | Freq: Three times a day (TID) | INTRAMUSCULAR | Status: DC
  Administered 2018-06-21 – 2018-06-26 (×13): 5000 [IU] via SUBCUTANEOUS
  Filled 2018-06-21 (×12): qty 1

## 2018-06-21 MED ORDER — FENTANYL CITRATE (PF) 100 MCG/2ML IJ SOLN
INTRAMUSCULAR | Status: DC | PRN
  Administered 2018-06-21 (×2): 25 ug via INTRAVENOUS

## 2018-06-21 MED ORDER — HEPARIN (PORCINE) IN NACL 1000-0.9 UT/500ML-% IV SOLN
INTRAVENOUS | Status: DC | PRN
  Administered 2018-06-21 (×2): 500 mL

## 2018-06-21 MED ORDER — ANGIOPLASTY BOOK
Freq: Once | Status: AC
  Administered 2018-06-21: 22:00:00
  Filled 2018-06-21: qty 1

## 2018-06-21 MED ORDER — SODIUM CHLORIDE 0.9 % IV SOLN: INTRAVENOUS | Status: DC

## 2018-06-21 MED ORDER — VANCOMYCIN HCL 10 G IV SOLR
1250.0000 mg | Freq: Three times a day (TID) | INTRAVENOUS | Status: DC
  Filled 2018-06-21 (×2): qty 1250

## 2018-06-21 MED ORDER — CLOPIDOGREL BISULFATE 300 MG PO TABS
ORAL_TABLET | ORAL | Status: DC | PRN
  Administered 2018-06-21: 300 mg via ORAL

## 2018-06-21 MED ORDER — SODIUM CHLORIDE 0.9 % WEIGHT BASED INFUSION
1.0000 mL/kg/h | INTRAVENOUS | Status: AC
  Administered 2018-06-21: 21:00:00 1 mL/kg/h via INTRAVENOUS

## 2018-06-21 MED ORDER — SODIUM CHLORIDE 0.9 % IV SOLN
250.0000 mL | INTRAVENOUS | Status: DC | PRN
  Administered 2018-06-23: 20:00:00 250 mL via INTRAVENOUS
  Administered 2018-06-24: 02:00:00 via INTRAVENOUS

## 2018-06-21 MED ORDER — MIDAZOLAM HCL 2 MG/2ML IJ SOLN
INTRAMUSCULAR | Status: DC | PRN
  Administered 2018-06-21: 1 mg via INTRAVENOUS
  Administered 2018-06-21: 2 mg via INTRAVENOUS

## 2018-06-21 MED ORDER — CLOPIDOGREL BISULFATE 75 MG PO TABS
300.0000 mg | ORAL_TABLET | Freq: Once | ORAL | Status: AC
  Administered 2018-06-21: 18:00:00 300 mg via ORAL
  Filled 2018-06-21: qty 4

## 2018-06-21 MED ORDER — IODIXANOL 320 MG/ML IV SOLN
INTRAVENOUS | Status: DC | PRN
  Administered 2018-06-21: 170 mL via INTRA_ARTERIAL

## 2018-06-21 MED ORDER — ONDANSETRON HCL 4 MG/2ML IJ SOLN: 4.0000 mg | Freq: Four times a day (QID) | INTRAMUSCULAR | Status: DC | PRN

## 2018-06-21 MED ORDER — NITROGLYCERIN 1 MG/10 ML FOR IR/CATH LAB
INTRA_ARTERIAL | Status: DC | PRN
  Administered 2018-06-21: 200 ug via INTRA_ARTERIAL
  Administered 2018-06-21: 200 mL via INTRA_ARTERIAL

## 2018-06-21 MED ORDER — MIDAZOLAM HCL 2 MG/2ML IJ SOLN
INTRAMUSCULAR | Status: AC
  Filled 2018-06-21: qty 2

## 2018-06-21 MED ORDER — SODIUM CHLORIDE 0.9% FLUSH
3.0000 mL | Freq: Two times a day (BID) | INTRAVENOUS | Status: DC
  Administered 2018-06-22 – 2018-06-26 (×7): 3 mL via INTRAVENOUS

## 2018-06-21 MED ORDER — CLOPIDOGREL BISULFATE 75 MG PO TABS
75.0000 mg | ORAL_TABLET | Freq: Every day | ORAL | Status: DC
  Administered 2018-06-22 – 2018-06-27 (×5): 75 mg via ORAL
  Filled 2018-06-21 (×5): qty 1

## 2018-06-21 MED ORDER — HEPARIN SODIUM (PORCINE) 1000 UNIT/ML IJ SOLN
INTRAMUSCULAR | Status: DC | PRN
  Administered 2018-06-21: 8000 [IU] via INTRAVENOUS

## 2018-06-21 MED ORDER — NITROGLYCERIN 1 MG/10 ML FOR IR/CATH LAB
INTRA_ARTERIAL | Status: AC
  Filled 2018-06-21: qty 10

## 2018-06-21 MED ORDER — SODIUM CHLORIDE 0.9% FLUSH: 3.0000 mL | INTRAVENOUS | Status: DC | PRN

## 2018-06-21 MED ORDER — LIDOCAINE HCL (PF) 1 % IJ SOLN
INTRAMUSCULAR | Status: AC
  Filled 2018-06-21: qty 30

## 2018-06-21 MED ORDER — LABETALOL HCL 5 MG/ML IV SOLN
10.0000 mg | INTRAVENOUS | Status: DC | PRN
  Filled 2018-06-21: qty 4

## 2018-06-21 MED ORDER — HEPARIN (PORCINE) IN NACL 1000-0.9 UT/500ML-% IV SOLN
INTRAVENOUS | Status: AC
  Filled 2018-06-21: qty 1000

## 2018-06-21 MED ORDER — ACETAMINOPHEN 325 MG PO TABS: 650.0000 mg | ORAL_TABLET | ORAL | Status: DC | PRN

## 2018-06-21 MED ORDER — VANCOMYCIN HCL 10 G IV SOLR
1250.0000 mg | Freq: Three times a day (TID) | INTRAVENOUS | Status: DC
  Administered 2018-06-22 – 2018-06-26 (×14): 1250 mg via INTRAVENOUS
  Filled 2018-06-21 (×16): qty 1250

## 2018-06-21 SURGICAL SUPPLY — 27 items
BAG SNAP BAND KOVER 36X36 (MISCELLANEOUS) ×3 IMPLANT
BALLN ADMIRAL INPACT 5X200 (BALLOONS) ×3
BALLN COYOTE OTW 2.5X220X150 (BALLOONS) ×3
BALLN STERLING OTW 5X220X150 (BALLOONS) ×3
BALLOON ADMIRAL INPACT 5X200 (BALLOONS) IMPLANT
BALLOON COYOTE OTW 2.5X220X150 (BALLOONS) IMPLANT
BALLOON STERLING OTW 5X220X150 (BALLOONS) ×2 IMPLANT
BUR JETSTREAM XC 2.4/3.4 (BURR) ×2 IMPLANT
BURR JETSTREAM XC 2.4/3.4 (BURR) ×3
CATH OMNI FLUSH 5F 65CM (CATHETERS) ×3 IMPLANT
CATH QUICKCROSS SUPP .035X90CM (MICROCATHETER) ×3 IMPLANT
CLOSURE MYNX CONTROL 6F/7F (Vascular Products) ×1 IMPLANT
COVER DOME SNAP 22 D (MISCELLANEOUS) ×3 IMPLANT
DEVICE TORQUE .025-.038 (MISCELLANEOUS) ×3 IMPLANT
GUIDEWIRE ANGLED .035X260CM (WIRE) ×3 IMPLANT
KIT ENCORE 26 ADVANTAGE (KITS) ×3 IMPLANT
KIT MICROPUNCTURE NIT STIFF (SHEATH) ×3 IMPLANT
KIT PV (KITS) ×3 IMPLANT
SHEATH HIGHFLEX ANSEL 7FR 55CM (SHEATH) ×3 IMPLANT
SHEATH PINNACLE 5F 10CM (SHEATH) ×3 IMPLANT
SHEATH PINNACLE 7F 10CM (SHEATH) ×1 IMPLANT
SHEATH PROBE COVER 6X72 (BAG) ×3 IMPLANT
SYR MEDRAD MARK V 150ML (SYRINGE) ×3 IMPLANT
TRANSDUCER W/STOPCOCK (MISCELLANEOUS) ×3 IMPLANT
TRAY PV CATH (CUSTOM PROCEDURE TRAY) ×3 IMPLANT
WIRE BENTSON .035X145CM (WIRE) ×3 IMPLANT
WIRE SPARTACORE .014X300CM (WIRE) ×3 IMPLANT

## 2018-06-21 NOTE — Consult Note (Signed)
Hospital Consult    Reason for Consult:  Right toe ulcer Referring Physician:  Dr. Sharol Given MRN #:  [id number]  History of Present Illness: This is a 62 y.o. male with history of diabetes and previously a smoker but quit 2 weeks ago.  No history of vascular disease.  Has a several week history of black discoloration of right toe.  He does not have any fever.  States he is having pain all the way up his right leg.  Has been evaluated by Dr. Sharol Given.  And he is requesting vascular consultation for evaluation of blood flow.  ABIs have been performed already.  Past Medical History:  Diagnosis Date  . Arthritis   . Diabetes mellitus without complication ([place])   . Enlarged prostate   . Herniated lumbar intervertebral disc   . Neck pain     Past Surgical History:  Procedure Laterality Date  . ABDOMINAL SURGERY    . open chest      Allergies  Allergen Reactions  . Adhesive [Tape] Other (See Comments)    Causes blisters - pls use paper tape    Prior to Admission medications   Medication Sig Start Date End Date Taking? Authorizing Provider  naproxen sodium (ALEVE) 220 MG tablet Take 440 mg by mouth 2 (two) times daily as needed (back or neck pain).   Yes [provider]  aspirin EC 81 MG EC tablet Take 1 tablet (81 mg total) by mouth daily. Patient not taking: Reported on 02/14/2017 07/08/12   Dhungel, Flonnie Overman, MD  cyclobenzaprine (FLEXERIL) 5 MG tablet Take 1 tablet (5 mg total) by mouth 3 (three) times daily as needed for muscle spasms. Patient not taking: Reported on 06/19/2018 02/14/17   Rolland Porter, MD  glipiZIDE (GLUCOTROL) 10 MG tablet Take 1 tablet (10 mg total) by mouth daily before breakfast. Patient not taking: Reported on 02/14/2017 07/08/12   Louellen Molder, MD    Social History   Socioeconomic History  . Marital status: Married    Spouse name: Not on file  . Number of children: Not on file  . Years of education: Not on file  . Highest education level: Not on file    Occupational History  . Not on file  Social Needs  . Financial resource strain: Not on file  . Food insecurity:    Worry: Not on file    Inability: Not on file  . Transportation needs:    Medical: Not on file    Non-medical: Not on file  Tobacco Use  . Smoking status: Former Smoker    Packs/day: 0.50    Years: 40.00    Pack years: 20.00    Types: Cigarettes  . Smokeless tobacco: Never Used  . Tobacco comment: quit on 06/14/2018  Substance and Sexual Activity  . Alcohol use: No  . Drug use: Yes    Types: Marijuana  . Sexual activity: Not Currently  Lifestyle  . Physical activity:    Days per week: Not on file    Minutes per session: Not on file  . Stress: Not on file  Relationships  . Social connections:    Talks on phone: Not on file    Gets together: Not on file    Attends religious service: Not on file    Active member of club or organization: Not on file    Attends meetings of clubs or organizations: Not on file    Relationship status: Not on file  . Intimate partner violence:  Fear of current or ex partner: Not on file    Emotionally abused: Not on file    Physically abused: Not on file    Forced sexual activity: Not on file  Other Topics Concern  . Not on file  Social History Narrative  . Not on file     Family History  Problem Relation Age of Onset  . Heart disease Mother   . Asthma Mother   . Heart disease Father     ROS: [x]  Positive   [ ]  Negative   [ ]  All sytems reviewed and are negative  Cardiovascular: []  chest pain/pressure []  palpitations []  SOB lying flat []  DOE []  pain in legs while walking []  pain in legs at rest []  pain in legs at night [x]  non-healing ulcers []  hx of DVT []  swelling in legs  Pulmonary: []  productive cough []  asthma/wheezing []  home O2  Neurologic: []  weakness in []  arms []  legs []  numbness in []  arms []  legs []  hx of CVA []  mini stroke [] difficulty speaking or slurred speech []  temporary loss of  vision in one eye []  dizziness  Hematologic: []  hx of cancer []  bleeding problems []  problems with blood clotting easily  Endocrine:   []  diabetes []  thyroid disease  GI []  vomiting blood []  blood in stool  GU: []  CKD/renal failure []  HD--[]  M/W/F or []  T/T/S []  burning with urination []  blood in urine  Psychiatric: []  anxiety []  depression  Musculoskeletal: []  arthritis []  joint pain  Integumentary: [x]  rashes []  ulcers  Constitutional: []  fever []  chills   Physical Examination  Vitals:   06/21/18 0332 06/21/18 0822  BP: 135/82 (!) 145/78  Pulse: 73 77  Resp:  16  Temp: 98.3 F (36.8 C) 98.2 F (36.8 C)  SpO2: 98% 98%   Body mass index is 26.2 kg/m.  General:  WDWN in NAD HENT: WNL, normocephalic Pulmonary: normal non-labored breathing Cardiac: Palpable femoral pulses bilaterally, no palpable popliteal pulse in the right Abdomen: soft, NT/ND, no masses Extremities: Right great toe is black there is surrounding erythema up to the level of the ankle but appears improved from previous mark in the mid lower leg Musculoskeletal: no muscle wasting or atrophy  Neurologic: A&O X 3; Appropriate Affect ; SENSATION: normal; MOTOR FUNCTION:  moving all extremities equally. Speech is fluent/normal  CBC    Component Value Date/Time   WBC 11.7 (H) 06/20/2018 0341   RBC 4.12 (L) 06/20/2018 0341   HGB 12.6 (L) 06/20/2018 0341   HCT 38.6 (L) 06/20/2018 0341   PLT 296 06/20/2018 0341   MCV 93.7 06/20/2018 0341   MCH 30.6 06/20/2018 0341   MCHC 32.6 06/20/2018 0341   RDW 12.7 06/20/2018 0341   LYMPHSABS 2.9 06/19/2018 1700   MONOABS 1.2 (H) 06/19/2018 1700   EOSABS 0.2 06/19/2018 1700   BASOSABS 0.1 06/19/2018 1700    BMET    Component Value Date/Time   NA 139 06/20/2018 0341   K 3.8 06/20/2018 0341   CL 105 06/20/2018 0341   CO2 26 06/20/2018 0341   GLUCOSE 115 (H) 06/20/2018 0341   BUN 7 (L) 06/20/2018 0341   CREATININE 0.80 06/20/2018 0341    CALCIUM 8.5 (L) 06/20/2018 0341   GFRNONAA >60 06/20/2018 0341   GFRAA >60 06/20/2018 0341    COAGS: Lab Results  Component Value Date   INR 1.02 06/19/2018     Non-Invasive Vascular Imaging:   0.5/0.7   ASSESSMENT/PLAN: This is a 62 y.o. male  with critical limb ischemia ABI of 0.5 on the right with gangrenous foot and subsequent ischemic infection.  Plan for aortogram bilateral lower extremity runoff possible intervention on the right.  Aspirin has been started.  He will probably need Plavix after intervention.  He will need statin for discharge and continue smoking cessation.  He is at high risk for proximal leg amputation without revascularization.  Tyannah Sane C. Donzetta Matters, MD [facility] Office: [phone] Pager: [phone]

## 2018-06-21 NOTE — Plan of Care (Signed)
  Problem: Coping: Goal: Level of anxiety will decrease Outcome: Progressing   Problem: Pain Managment: Goal: General experience of comfort will improve Outcome: Progressing   Problem: Safety: Goal: Ability to remain free from injury will improve Outcome: Progressing   Problem: Skin Integrity: Goal: Risk for impaired skin integrity will decrease Outcome: Progressing   

## 2018-06-21 NOTE — Progress Notes (Signed)
Pharmacy Antibiotic Note  [name] is a 62 y.o. male admitted on 06/19/2018 with sepsis/diabetic foot infection.   WBC down to 11.7, afebrile, and renal function normal.   Vancomycin trough today was 14, slightly below desired goal.   Cultures are no growth.   Plan: Continue ceftriaxone 1g q24 hours Increase vancomycin to 1250mg  q 8 hours Monitor renal fx cx vt prn   Height: 5' 8.11" (173 cm) Weight: 172 lb 14.4 oz (78.4 kg) IBW/kg (Calculated) : 68.65  Temp (24hrs), Avg:98.1 F (36.7 C), Min:97.8 F (36.6 C), Max:98.3 F (36.8 C)  Recent Labs  Lab 06/19/18 1700 06/19/18 1805 06/20/18 0341 06/21/18 1703  WBC 17.2*  --  11.7*  --   CREATININE 0.72  --  0.80  --   LATICACIDVEN  --  1.86  --   --   VANCOTROUGH  --   --   --  14*    Estimated Creatinine Clearance: 90.6 mL/min (by C-G formula based on SCr of 0.8 mg/dL).    Allergies  Allergen Reactions  . Adhesive [Tape] Other (See Comments)    Causes blisters - pls use paper tape   Zosyn 11/16>>11/17 Ceftriaxone 11/17>> Vanc 11/16>>  11/16 BCx: ngtd   Erin Hearing PharmD., BCPS Clinical Pharmacist 06/21/2018 6:28 PM  Please check AMION for all [place] numbers  06/21/2018 6:25 PM

## 2018-06-21 NOTE — Consult Note (Signed)
ORTHOPAEDIC CONSULTATION  REQUESTING PHYSICIAN: Cristal Deer, MD  Chief Complaint: Gangrenous ulcer on right great toe.  HPI: [name] is a 62 y.o. male who presents with gangrenous ulcer right great toe.  Patient has a history of diabetes uncontrolled with history of tobacco use.  Past Medical History:  Diagnosis Date  . Arthritis   . Diabetes mellitus without complication ([place])   . Enlarged prostate   . Herniated lumbar intervertebral disc   . Neck pain    Past Surgical History:  Procedure Laterality Date  . ABDOMINAL SURGERY    . open chest     Social History   Socioeconomic History  . Marital status: Married    Spouse name: Not on file  . Number of children: Not on file  . Years of education: Not on file  . Highest education level: Not on file  Occupational History  . Not on file  Social Needs  . Financial resource strain: Not on file  . Food insecurity:    Worry: Not on file    Inability: Not on file  . Transportation needs:    Medical: Not on file    Non-medical: Not on file  Tobacco Use  . Smoking status: Former Smoker    Packs/day: 0.50    Years: 40.00    Pack years: 20.00    Types: Cigarettes  . Smokeless tobacco: Never Used  . Tobacco comment: quit on 06/14/2018  Substance and Sexual Activity  . Alcohol use: No  . Drug use: Yes    Types: Marijuana  . Sexual activity: Not Currently  Lifestyle  . Physical activity:    Days per week: Not on file    Minutes per session: Not on file  . Stress: Not on file  Relationships  . Social connections:    Talks on phone: Not on file    Gets together: Not on file    Attends religious service: Not on file    Active member of club or organization: Not on file    Attends meetings of clubs or organizations: Not on file    Relationship status: Not on file  Other Topics Concern  . Not on file  Social History Narrative  . Not on file   Family History  Problem Relation Age of Onset  . Heart  disease Mother   . Asthma Mother   . Heart disease Father    - negative except otherwise stated in the family history section Allergies  Allergen Reactions  . Adhesive [Tape] Other (See Comments)    Causes blisters - pls use paper tape   Prior to Admission medications   Medication Sig Start Date End Date Taking? Authorizing Provider  naproxen sodium (ALEVE) 220 MG tablet Take 440 mg by mouth 2 (two) times daily as needed (back or neck pain).   Yes [provider]  aspirin EC 81 MG EC tablet Take 1 tablet (81 mg total) by mouth daily. Patient not taking: Reported on 02/14/2017 07/08/12   Dhungel, Flonnie Overman, MD  cyclobenzaprine (FLEXERIL) 5 MG tablet Take 1 tablet (5 mg total) by mouth 3 (three) times daily as needed for muscle spasms. Patient not taking: Reported on 06/19/2018 02/14/17   Rolland Porter, MD  glipiZIDE (GLUCOTROL) 10 MG tablet Take 1 tablet (10 mg total) by mouth daily before breakfast. Patient not taking: Reported on 02/14/2017 07/08/12   Louellen Molder, MD   Dg Tibia/fibula Right  Result Date: 06/19/2018 CLINICAL DATA:  Right foot and  lower leg redness and swelling. EXAM: RIGHT TIBIA AND FIBULA - 2 VIEW COMPARISON:  Right foot radiographs obtained at the same time. FINDINGS: Mild patellofemoral spur formation. Previously described degenerative spurs involving the talotibial joint and calcaneus. No soft tissue gas, bone destruction or periosteal reaction. IMPRESSION: No acute abnormality. Degenerative changes. Electronically Signed   By: Claudie Revering M.D.   On: 06/19/2018 19:21   Dg Foot Complete Right  Result Date: 06/19/2018 CLINICAL DATA:  Right foot pain, redness and swelling. No known injury. Clinical concern for infection EXAM: RIGHT FOOT COMPLETE - 3+ VIEW COMPARISON:  None. FINDINGS: Mild to moderate inferior calcaneal spur formation. Mild to moderate anterior talotibial spur formation. No visible soft tissue swelling, soft tissue gas, bone destruction or periosteal  reaction. IMPRESSION: No acute abnormality.  Degenerative changes, as described above. Electronically Signed   By: Claudie Revering M.D.   On: 06/19/2018 18:18   Vas [place] Burnard Bunting With/wo Tbi  Result Date: 06/20/2018 LOWER EXTREMITY DOPPLER STUDY Indications: Gangrene. High Risk Factors: Diabetes, current smoker.  Comparison Study: No prior study on file Performing Technologist: Sharion Dove RVS  Examination Guidelines: A complete evaluation includes at minimum, Doppler waveform signals and systolic blood pressure reading at the level of bilateral brachial, anterior tibial, and posterior tibial arteries, when vessel segments are accessible. Bilateral testing is considered an integral part of a complete examination. Photoelectric Plethysmograph (PPG) waveforms and toe systolic pressure readings are included as required and additional duplex testing as needed. Limited examinations for reoccurring indications may be performed as noted.  ABI Findings: +---------+------------------+-----+-------------------+--------+ Right    Rt Pressure (mmHg)IndexWaveform           Comment  +---------+------------------+-----+-------------------+--------+ Brachial 142                    triphasic                   +---------+------------------+-----+-------------------+--------+ PTA      74                0.52 dampened monophasic         +---------+------------------+-----+-------------------+--------+ DP       71                0.50 dampened monophasic         +---------+------------------+-----+-------------------+--------+ Great Toe0                 0.00                             +---------+------------------+-----+-------------------+--------+ +---------+------------------+-----+----------+-------+ Left     Lt Pressure (mmHg)IndexWaveform  Comment +---------+------------------+-----+----------+-------+ Brachial 136                    triphasic          +---------+------------------+-----+----------+-------+ PTA      102               0.72 monophasic        +---------+------------------+-----+----------+-------+ DP       83                0.58 monophasic        +---------+------------------+-----+----------+-------+ Ellen Henri               0.79                   +---------+------------------+-----+----------+-------+ +-------+-----------+-----------+------------+------------+ ABI/TBIToday's ABIToday's TBIPrevious ABIPrevious TBI +-------+-----------+-----------+------------+------------+ Right  0.52       0                                   +-------+-----------+-----------+------------+------------+ Left   0.72       0.79                                +-------+-----------+-----------+------------+------------+  Summary: Right: Resting right ankle-brachial index indicates moderate right lower extremity arterial disease. The right toe-brachial index is abnormal. Left: Resting left ankle-brachial index indicates moderate left lower extremity arterial disease. The left toe-brachial index is normal.  *See table(s) above for measurements and observations.  Electronically signed by Ruta Hinds MD on 06/20/2018 at 9:13:48 AM.    Final    - pertinent xrays, CT, MRI studies were reviewed and independently interpreted  Positive ROS: All other systems have been reviewed and were otherwise negative with the exception of those mentioned in the HPI and as above.  Physical Exam: General: Alert, no acute distress Psychiatric: Patient is competent for consent with normal mood and affect Lymphatic: No axillary or cervical lymphadenopathy Cardiovascular: No pedal edema Respiratory: No cyanosis, no use of accessory musculature GI: No organomegaly, abdomen is soft and non-tender    Images:  @ENCIMAGES @  Labs:  Lab Results  Component Value Date   HGBA1C 7.8 (H) 06/20/2018   HGBA1C 8.5 (H) 07/08/2012   ESRSEDRATE 57 (H)  06/19/2018   CRP 10.5 (H) 06/19/2018   REPTSTATUS PENDING 06/19/2018   CULT  06/19/2018    NO GROWTH < 24 HOURS Performed at [facility], [place] [address], [place], [place] [address]     Lab Results  Component Value Date   ALBUMIN 3.7 06/19/2018   ALBUMIN 4.4 02/14/2017   ALBUMIN 3.2 (L) 07/08/2012    Neurologic: Patient does not have protective sensation bilateral lower extremities.   MUSCULOSKELETAL:   Skin: Examination patient has ischemic gangrenous changes to the entire right great toe his foot is cold he has cellulitis in the foot which extended up the leg which is improving with the IV antibiotics.  Patient also has a ischemic changes to the tip of the third toe as well as an ischemic ulcer in the fourth webspace between the fourth and fifth toes.  Patient does not have a palpable dorsalis pedis or popliteal pulse.  He does have a palpable femoral pulse.  Ankle-brachial indices shows dampened monophasic flow at 50% on the right.  Review of the radiographs shows no destructive bony changes.  Assessment: Assessment: Diabetic insensate neuropathy with history of tobacco use with severe peripheral vascular disease with gangrene of the great toe as well as ischemic changes to the lesser toes.  Plan: Plan: Recommend consultation with vascular vein specialist to see if patient is a revascularization candidate.  Patient most likely would not heal an amputation of the right great toe with the ischemic changes across the entire forefoot.  Thank you for the consult and the opportunity to see [name], [place] [phone] 6:52 AM

## 2018-06-21 NOTE — Progress Notes (Signed)
PROGRESS NOTE    [name]  VEH:[id number] DOB: 1956/02/10 DOA: 06/19/2018 PCP: Patient, No Pcp Per  Outpatient Specialists:   Brief Narrative:  [name] is a 62 year old male with medical history significant for diabetes , BPH herniated disks with chronic back pain current smoker but quit  5 days ago he is being admitted for right foot ulcer with gangrene.  He thought that he may have injured it about 10 days ago when he stepped on a rock after which she developed a large blister on the right great toe.  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  Right foot ulcer with gangrene.  Orthopedic has been consulted and they have evaluated patient and his ABI showed moderate to severe decrease in atrial blood flow on the right same on the left will there is no pressure in the right great toe but there is pressure in the left which was normal 2.  Diabetes mellitus uncontrolled her last A1c was 8.5 patient is on glipizide we will treat with sliding scale insulin 3.  Sepsis due to cellulitis and gangrene of the right great toe.  His leukocyte was initially 17.1 on admission today it is 11.7 he is responding to the antibiotics.  We will continue Zosyn that was started in the ED as well as vancomycin and Rocephin.  Blood cultures are still pending we will continue supportive care with normal saline at 125 mils per hour 4.  Chronic low back pain with herniated disc continue pain management with Percocet as needed 5.  tobacco abuse recent quitting.  Patient encouraged to stay quit    DVT ppx: SQ Heparin Code Status: Full code Family Communication: None at bed side.  Disposition Plan:  Anticipate discharge back to previous home environment Consults :   Dr. Marcelino Scot , [name], MD  Ortho Dr. Servando Snare, vascular surgery    Procedures:  Procedure Performed: 1.  Ultrasound-guided cannulation left common femoral artery 2.  Aortogram bilateral lower extremity runoff 3.  Jetstream atherectomy right popliteal artery 4.  Drug-coated balloon angioplasty right popliteal with 5 x 200 mm in.pact 5.  Balloon angioplasty of posterior tibial artery on the right with 2.5 mm balloon 6.  Minx device closure left common femoral artery  Antimicrobials:  Vancomycin  Rocephin  Subjective: Patient seen at bedside denies any complaint except that he is tired because he did not have much sleep last night due to pain in his leg or foot.  Alert and oriented x3 Subjective, June 21, 2018: Patient seen and examined at bedside he does not have any complaints he stated he is being scheduled to be evaluated at the OR to check to see how his circulation is.  Objective: Vitals:   06/21/18 1520 06/21/18 1525 06/21/18 1530 06/21/18 1611  BP: 127/79 130/79 128/81 (!) 149/74  Pulse: 67 75 72 66  Resp: (!) 22 14 17 15   Temp:    97.9 F (36.6 C)  TempSrc:    Oral  SpO2: 98% 98% 98% 98%  Weight:      Height:        Intake/Output Summary (Last 24 hours) at 06/21/2018 1919 Last data filed at 06/21/2018 1845 Gross per 24 hour  Intake 1480 ml  Output  3700 ml  Net -2220 ml   Filed Weights   06/19/18 1640 06/19/18 2200  Weight: 80.3 kg 78.4 kg    Examination:  General exam: Appears calm and comfortable  Respiratory system: Clear to auscultation. Respiratory effort normal. Cardiovascular system: S1 & S2 heard, RRR. No JVD, murmurs, rubs, gallops or clicks. No pedal edema. Gastrointestinal system: Abdomen is nondistended, soft and nontender. No organomegaly or masses felt. Normal bowel sounds heard. Central nervous system: Alert and oriented. No focal neurological deficits. Extremities: Moves all 4 extremity .  He does have a dark palpable black-colored bullae in the right great  toe which is swollen and warm and erythematous and tender to touch in the dorsal area.  Difficult to palpate distal pulses Skin: No rashes, lesions or ulcers Psychiatry: Judgement and insight appear normal. Mood & affect appropriate.     Data Reviewed: I have personally reviewed following labs and imaging studies  CBC: Recent Labs  Lab 06/19/18 1700 06/20/18 0341  WBC 17.2* 11.7*  NEUTROABS 12.8*  --   HGB 14.9 12.6*  HCT 45.1 38.6*  MCV 94.2 93.7  PLT 334 427   Basic Metabolic Panel: Recent Labs  Lab 06/19/18 1700 06/20/18 0341  NA 137 139  K 4.2 3.8  CL 100 105  CO2 27 26  GLUCOSE 205* 115*  BUN 10 7*  CREATININE 0.72 0.80  CALCIUM 9.5 8.5*   GFR: Estimated Creatinine Clearance: 90.6 mL/min (by C-G formula based on SCr of 0.8 mg/dL). Liver Function Tests: Recent Labs  Lab 06/19/18 1700  AST 14*  ALT 12  ALKPHOS 98  BILITOT 0.7  PROT 7.5  ALBUMIN 3.7   No results for input(s): LIPASE, AMYLASE in the last 168 hours. No results for input(s): AMMONIA in the last 168 hours. Coagulation Profile: Recent Labs  Lab 06/19/18 2107  INR 1.02   Cardiac Enzymes: No results for input(s): CKTOTAL, CKMB, CKMBINDEX, TROPONINI in the last 168 hours. BNP (last 3 results) No results for input(s): PROBNP in the last 8760 hours. HbA1C: Recent Labs    06/20/18 0341  HGBA1C 7.8*   CBG: Recent Labs  Lab 06/20/18 1558 06/20/18 2039 06/21/18 0743 06/21/18 1155 06/21/18 1601  GLUCAP 133* 160* 146* 190* 119*   Lipid Profile: No results for input(s): CHOL, HDL, LDLCALC, TRIG, CHOLHDL, LDLDIRECT in the last 72 hours. Thyroid Function Tests: No results for input(s): TSH, T4TOTAL, FREET4, T3FREE, THYROIDAB in the last 72 hours. Anemia Panel: No results for input(s): VITAMINB12, FOLATE, FERRITIN, TIBC, IRON, RETICCTPCT in the last 72 hours. Urine analysis:    Component Value Date/Time   COLORURINE YELLOW 07/07/2012 2114   APPEARANCEUR CLEAR 07/07/2012 2114    LABSPEC 1.010 07/07/2012 2114   PHURINE 5.5 07/07/2012 2114   GLUCOSEU 100 (A) 07/07/2012 2114   HGBUR NEGATIVE 07/07/2012 2114   BILIRUBINUR NEGATIVE 07/07/2012 2114   KETONESUR NEGATIVE 07/07/2012 2114   PROTEINUR NEGATIVE 07/07/2012 2114   UROBILINOGEN 0.2 07/07/2012 2114   NITRITE NEGATIVE 07/07/2012 2114   LEUKOCYTESUR NEGATIVE 07/07/2012 2114   Sepsis Labs: @LABRCNTIP (procalcitonin:4,lacticidven:4)  ) Recent Results (from the past 240 hour(s))  Blood Culture (routine x 2)     Status: None (Preliminary result)   Collection Time: 06/19/18  5:00 PM  Result Value Ref Range Status   Specimen Description BLOOD LEFT ANTECUBITAL  Final   Special Requests   Final    BOTTLES DRAWN AEROBIC AND ANAEROBIC Blood Culture adequate volume   Culture   Final    NO GROWTH  2 DAYS Performed at [facility], [place] [address] [place], [place] [address]    Report Status PENDING  Incomplete  Blood Culture (routine x 2)     Status: None (Preliminary result)   Collection Time: 06/19/18  5:30 PM  Result Value Ref Range Status   Specimen Description BLOOD LEFT HAND  Final   Special Requests   Final    BOTTLES DRAWN AEROBIC ONLY Blood Culture results may not be optimal due to an inadequate volume of blood received in culture bottles   Culture   Final    NO GROWTH 2 DAYS Performed at [facility], [place] [address] [place], [place] [address]    Report Status PENDING  Incomplete  Surgical pcr screen     Status: None   Collection Time: 06/21/18 10:18 AM  Result Value Ref Range Status   MRSA, PCR NEGATIVE NEGATIVE Final   Staphylococcus aureus NEGATIVE NEGATIVE Final    Comment: (NOTE) The Xpert SA Assay (FDA approved for NASAL specimens in patients 89 years of age and older), is one component of a comprehensive surveillance program. It is not intended to diagnose infection nor to guide or monitor treatment. Performed at [facility], [place] [address] [place], [place] [address]           Radiology Studies: Vas [place] Abi With/wo Tbi  Result Date: 06/20/2018 LOWER EXTREMITY DOPPLER STUDY Indications: Gangrene. High Risk Factors: Diabetes, current smoker.  Comparison Study: No prior study on file Performing Technologist: Sharion Dove RVS  Examination Guidelines: A complete evaluation includes at minimum, Doppler waveform signals and systolic blood pressure reading at the level of bilateral brachial, anterior tibial, and posterior tibial arteries, when vessel segments are accessible. Bilateral testing is considered an integral part of a complete examination. Photoelectric Plethysmograph (PPG) waveforms and toe systolic pressure readings are included as required and additional duplex testing as needed. Limited examinations for reoccurring indications may be performed as noted.  ABI Findings: +---------+------------------+-----+-------------------+--------+ Right    Rt Pressure (mmHg)IndexWaveform           Comment  +---------+------------------+-----+-------------------+--------+ Brachial 142                    triphasic                   +---------+------------------+-----+-------------------+--------+ PTA      74                0.52 dampened monophasic         +---------+------------------+-----+-------------------+--------+ DP       71                0.50 dampened monophasic         +---------+------------------+-----+-------------------+--------+ Great Toe0                 0.00                             +---------+------------------+-----+-------------------+--------+ +---------+------------------+-----+----------+-------+ Left     Lt Pressure (mmHg)IndexWaveform  Comment +---------+------------------+-----+----------+-------+ Brachial 136                    triphasic         +---------+------------------+-----+----------+-------+ PTA      102               0.72 monophasic        +---------+------------------+-----+----------+-------+  DP  83                0.58 monophasic        +---------+------------------+-----+----------+-------+ Great Toe112               0.79                   +---------+------------------+-----+----------+-------+ +-------+-----------+-----------+------------+------------+ ABI/TBIToday's ABIToday's TBIPrevious ABIPrevious TBI +-------+-----------+-----------+------------+------------+ Right  0.52       0                                   +-------+-----------+-----------+------------+------------+ Left   0.72       0.79                                +-------+-----------+-----------+------------+------------+  Summary: Right: Resting right ankle-brachial index indicates moderate right lower extremity arterial disease. The right toe-brachial index is abnormal. Left: Resting left ankle-brachial index indicates moderate left lower extremity arterial disease. The left toe-brachial index is normal.  *See table(s) above for measurements and observations.  Electronically signed by Ruta Hinds MD on 06/20/2018 at 9:13:48 AM.    Final         Scheduled Meds: . aspirin EC  81 mg Oral Daily  . [START ON 06/22/2018] clopidogrel  75 mg Oral Q breakfast  . heparin  5,000 Units Subcutaneous Q8H  . insulin aspart  0-5 Units Subcutaneous QHS  . insulin aspart  0-9 Units Subcutaneous TID WC  . rosuvastatin  10 mg Oral q1800  . [START ON 06/22/2018] sodium chloride flush  3 mL Intravenous Q12H   Continuous Infusions: . [START ON 06/22/2018] sodium chloride    . sodium chloride 1 mL/kg/hr (06/21/18 1615)  . cefTRIAXone (ROCEPHIN)  IV 1 g (06/21/18 0516)  . vancomycin       LOS: 2 days    Time spent: 15 minutes    Cristal Deer, MD [facility] Pager [phone]  If 7PM-7AM, please contact night-coverage [email or web address] Password [facility] 06/21/2018, 7:19 PM

## 2018-06-21 NOTE — Op Note (Signed)
Patient name: [name] MRN: [id number] DOB: 1956/02/11 Sex: male  06/21/2018 Pre-operative Diagnosis: Critical right lower extremity ischemia with wound Post-operative diagnosis:  Same Surgeon:  Erlene Quan C. Donzetta Matters, MD Procedure Performed: 1.  Ultrasound-guided cannulation left common femoral artery 2.  Aortogram bilateral lower extremity runoff 3.  Jetstream atherectomy right popliteal artery 4.  Drug-coated balloon angioplasty right popliteal with 5 x 200 mm in.pact 5.  Balloon angioplasty of posterior tibial artery on the right with 2.5 mm balloon 6.  Minx device closure left common femoral artery 7.  Moderate sedation with fentanyl and Versed for 70 minutes  Indications: 62 year old male who presented with gangrenous right great toe and erythematous streaking up his leg.  This is mostly resolved with antibiotics but he will require at least great toe amputation.  He has moderately depressed ABI on the right is indicated for angio and possible intervention.  Findings: Aorta and iliac arteries are free of flow-limiting stenosis although there is some calcification of the left external iliac artery.  The right common femoral artery is patent although there is no profunda femoris artery to speak of and the SFA runs off to the adductor where it occludes and reconstitutes above the knee popliteal artery.  He has peroneal and posterior tibial runoff to the ankle.  After intervention there was no flow-limiting stenosis or dissection in the previous occluded SFA popliteal segment but then I had no flow via the tibials.  After balloon angioplasty was posterior tibial artery administration of 400 mcg of nitroglycerin these were evident with flow strong to the ankle.  The left SFA occludes reconstitutes a popliteal artery at the knee looks like dominant runoff is via the posterior tibial artery but it is very sluggish.   Procedure:  The patient was identified in the holding area and taken to room 8.   The patient was then placed supine on the table and prepped and draped in the usual sterile fashion.  A time out was called.  Ultrasound was used to evaluate the left common femoral artery which was noted to be patent and the area was anesthetized 1% lidocaine.  It was cannulated directly with ultrasound visualization with micropuncture needle followed by wire and sheath.  An image was saved the permanent record.  Bentson wire was placed followed by 5 [place] sheath.  Omni Flush catheter placed to level of L1 and aortogram followed by bilateral lower extremity runoff was obtained with the above findings.  We then crossed the bifurcation with Omni catheter and placed a long 7 French sheath into the right SFA and the patient was fully heparinized.  We used a Glidewire and quick cross catheter to cross the occluded SFA popliteal segment confirmed intraluminal access.  We then performed jetstream atherectomy with both blades down and blades up of the occluded segment.  Following this we performed 5 mm balloon angioplasty.  There was some minimal dissection we elected to perform drug-coated balloon angioplasty which was done with a 5 x 200 mm in.pact balloon at nominal pressure for 3 minutes.  Following this there was no further residual dissection or stenosis were was previously occluded.  Unfortunately now he had very minimal tibial flow.  We administered 200 mcg of nitroglycerin.  We then had peroneal flow to the ankle but it was quite diminutive.  We then had our 1 4 wire down the posterior tibial artery we advanced this easily performed balloon angioplasty of the entire posterior tibial artery for 2 minutes at nominal  pressure with a 2.5 mm balloon.  After this we administered another 200 mcg of nitroglycerin now we had flow via very large tibial vessels to at least the ankle.  Satisfied with this we exchanged for short 6 French sheath deployed a minx device which deployed well.  He tolerated this procedure well  without immediate complication.    Contrast: 170 cc  Sharah Finnell C. Donzetta Matters, MD [facility] Office: [phone] Pager: [phone]

## 2018-06-21 NOTE — Plan of Care (Signed)

## 2018-06-22 ENCOUNTER — Encounter (HOSPITAL_COMMUNITY): Payer: Self-pay | Admitting: Vascular Surgery

## 2018-06-22 DIAGNOSIS — Z87891 Personal history of nicotine dependence: Secondary | ICD-10-CM

## 2018-06-22 LAB — BASIC METABOLIC PANEL
Anion gap: 8 (ref 5–15)
BUN: <5 mg/dL — ABNORMAL LOW (ref 8–23)
CO2: 26 mmol/L (ref 22–32)
Calcium: 8.4 mg/dL — ABNORMAL LOW (ref 8.9–10.3)
Chloride: 103 mmol/L (ref 98–111)
Creatinine, Ser: 0.69 mg/dL (ref 0.61–1.24)
GFR calc Af Amer: >60 mL/min (ref >60)
GFR calc non Af Amer: >60 mL/min (ref >60)
Glucose, Bld: 145 mg/dL — ABNORMAL HIGH (ref 70–99)
Potassium: 3.5 mmol/L (ref 3.5–5.1)
Sodium: 137 mmol/L (ref 135–145)

## 2018-06-22 LAB — CBC
HCT: 36.5 % — ABNORMAL LOW (ref 39.0–52.0)
Hemoglobin: 12.2 g/dL — ABNORMAL LOW (ref 13.0–17.0)
MCH: 30.7 pg (ref 26.0–34.0)
MCHC: 33.4 g/dL (ref 30.0–36.0)
MCV: 91.9 fL (ref 80.0–100.0)
Platelets: 250 10*3/uL (ref 150–400)
RBC: 3.97 MIL/uL — ABNORMAL LOW (ref 4.22–5.81)
RDW: 12.4 % (ref 11.5–15.5)
WBC: 11 10*3/uL — ABNORMAL HIGH (ref 4.0–10.5)
nRBC: 0 % (ref 0.0–0.2)

## 2018-06-22 LAB — GLUCOSE, CAPILLARY
Glucose-Capillary: 157 mg/dL — ABNORMAL HIGH (ref 70–99)
Glucose-Capillary: 130 mg/dL — ABNORMAL HIGH (ref 70–99)
Glucose-Capillary: 144 mg/dL — ABNORMAL HIGH (ref 70–99)
Glucose-Capillary: 173 mg/dL — ABNORMAL HIGH (ref 70–99)

## 2018-06-22 MED ORDER — POTASSIUM CHLORIDE CRYS ER 20 MEQ PO TBCR
40.0000 meq | EXTENDED_RELEASE_TABLET | Freq: Once | ORAL | Status: AC
  Administered 2018-06-22: 21:00:00 40 meq via ORAL
  Filled 2018-06-22: qty 2

## 2018-06-22 MED ORDER — PIPERACILLIN-TAZOBACTAM 3.375 G IVPB
3.3750 g | Freq: Three times a day (TID) | INTRAVENOUS | Status: DC
  Administered 2018-06-22 – 2018-06-26 (×12): 3.375 g via INTRAVENOUS
  Filled 2018-06-22 (×14): qty 50

## 2018-06-22 NOTE — Progress Notes (Signed)
  Progress Note    06/22/2018 3:02 PM 1 Day Post-Op  Subjective: Foot is feeling warmer  Vitals:   06/22/18 1138 06/22/18 1212  BP: (!) 151/82   Pulse: 73   Resp: 16 (!) 25  Temp: 97.7 F (36.5 C)   SpO2: 97%     Physical Exam: Awake alert oriented Nonlabored respirations Left groin soft without hematoma Palpable right posterior tibial pulse Right foot is red although appears to be without erythema, right great toe with dry gangrene  CBC    Component Value Date/Time   WBC 11.0 (H) 06/22/2018 0357   RBC 3.97 (L) 06/22/2018 0357   HGB 12.2 (L) 06/22/2018 0357   HCT 36.5 (L) 06/22/2018 0357   PLT 250 06/22/2018 0357   MCV 91.9 06/22/2018 0357   MCH 30.7 06/22/2018 0357   MCHC 33.4 06/22/2018 0357   RDW 12.4 06/22/2018 0357   LYMPHSABS 2.9 06/19/2018 1700   MONOABS 1.2 (H) 06/19/2018 1700   EOSABS 0.2 06/19/2018 1700   BASOSABS 0.1 06/19/2018 1700    BMET    Component Value Date/Time   NA 137 06/22/2018 0357   K 3.5 06/22/2018 0357   CL 103 06/22/2018 0357   CO2 26 06/22/2018 0357   GLUCOSE 145 (H) 06/22/2018 0357   BUN <5 (L) 06/22/2018 0357   CREATININE 0.69 06/22/2018 0357   CALCIUM 8.4 (L) 06/22/2018 0357   GFRNONAA >60 06/22/2018 0357   GFRAA >60 06/22/2018 0357    INR    Component Value Date/Time   INR 1.02 06/19/2018 2107     Intake/Output Summary (Last 24 hours) at 06/22/2018 1502 Last data filed at 06/22/2018 1028 Gross per 24 hour  Intake 490 ml  Output 1650 ml  Net -1160 ml     Assessment/plan:  62 y.o. male is s/p right lower extremity revascularization for necrotic right great toe.  From a vascular standpoint he is okay for toe amputation.  I discussed with him the need to protect his left foot as he has similar disease process there.  I will set up follow up with him in 4 to 6 weeks in the office with right lower extremity duplex and ABIs.  Please let us know if we can be of further assistance while patient is  admitted.   Lavella Myren C. Donzetta Matters, MD [facility] Office: [phone] Pager: [phone]  06/22/2018 3:02 PM

## 2018-06-22 NOTE — Consult Note (Signed)
[place] Nurse wound follow up Right great toe is cool and gangrenous.  Revascularization is optimized and patient will require amputation of great toe, at least.  Is being followed by Dr Sharol Given and will defer to his service.  No further WOC needs at this time.  Will not follow at this time.  Please re-consult if needed.  Domenic Moras MSN, RN, FNP-BC CWON Wound, Ostomy, Continence Nurse Pager [phone]

## 2018-06-22 NOTE — Progress Notes (Signed)
Pharmacy Antibiotic Note  [name] is a 62 y.o. male admitted on 06/19/2018 with diabetic foot infection.  Pharmacy has been consulted for Zosyn dosing.  Plan: Zosyn 3.375g IV q8h (4 hour infusion).  Monitor clinical progress, cultures/sensitivities, renal function, abx plan   Height: 5' 8.11" (173 cm) Weight: 174 lb 2.6 oz (79 kg) IBW/kg (Calculated) : 68.65  Temp (24hrs), Avg:98.1 F (36.7 C), Min:97.7 F (36.5 C), Max:98.4 F (36.9 C)  Recent Labs  Lab 06/19/18 1700 06/19/18 1805 06/20/18 0341 06/21/18 1703 06/22/18 0357  WBC 17.2*  --  11.7*  --  11.0*  CREATININE 0.72  --  0.80  --  0.69  LATICACIDVEN  --  1.86  --   --   --   VANCOTROUGH  --   --   --  14*  --     Estimated Creatinine Clearance: 90.6 mL/min (by C-G formula based on SCr of 0.69 mg/dL).    Allergies  Allergen Reactions  . Adhesive [Tape] Other (See Comments)    Causes blisters - pls use paper tape    Antimicrobials this admission: Ceftriaxone 11/17>>11/19 Zosyn 11/16>>11/17, 11/19>> Vanc 11/16>>  Dose adjustments this admission:  Microbiology results: 11/16 BCx: ng x 3 days 11/18 MRSA PCR negative    Thank you for allowing [place] to participate in this patients care.   Jens Som, PharmD Please utilize Amion (under [place]) for appropriate number for your unit pharmacist. 06/22/2018 6:03 PM

## 2018-06-22 NOTE — Progress Notes (Signed)
PROGRESS NOTE    [name]  NLZ:[id number] DOB: Sep 27, 1955 DOA: 06/19/2018 PCP: Patient, No Pcp Per  Outpatient Specialists:   Brief Narrative:  Wilian Kwong is a 62 year old male with medical history significant for diabetes , BPH herniated disks with chronic back pain current smoker but quit  5 days ago he is being admitted for right foot ulcer with gangrene.  He thought that he may have injured it about 10 days ago when he stepped on a rock after which he developed a large blister on the right great toe.  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  Right foot ulcer with gangrene.  Orthopedic has been consulted and they have evaluated patient and his ABI showed moderate to severe decrease in atrial blood flow on the right same on the left will there is no pressure in the right great toe but there is pressure in the left which was normal.  -He still has erythema but improving but the big toe is black and necrotic appearing. -Vascular surgery was consulted and patient underwent angioplasty of the right popliteal, balloon angioplasty of posterior tibial artery on the right. -Patient likely will need amputation of the big toe.  Awaiting further input from orthopedic surgery. 2.  Diabetes mellitus uncontrolled. last A1c was 8.5.  -Patient has glipizide listed on his home medications but he says he has not been taking any medications for his diabetes. -Continue Accu-Cheks, insulin sliding scale for now.  He says metformin made him sick and he is reluctant to take metformin.  Once his acute issues have been resolved will need to discuss with him regarding starting oral hypoglycemics. -Advised him to establish PCP and follow-up for his diabetes once his acute issues have resolved. 3.   Sepsis due to cellulitis and gangrene of the right great toe.  His leukocyte was initially 17.1.  Improving.  He is responding to the antibiotics.  Continue vancomycin, Zosyn.  Will DC ceftriaxone.  Blood cultures NGTD. 4.  Chronic low back pain with herniated disc continue pain management with Percocet as needed 5.  tobacco abuse recently quit.  Patient encouraged to stay quit  DVT ppx: SQ Heparin Code Status: Full code Family Communication: None at bed side.  Disposition Plan:  Anticipate discharge back to previous home environment Consults :   Dr. Marcelino Scot , [name], MD Ortho Dr. Servando Snare, vascular surgery    Procedures:  Procedure Performed: 1.  Ultrasound-guided cannulation left common femoral artery 2.  Aortogram bilateral lower extremity runoff 3.  Jetstream atherectomy right popliteal artery 4.  Drug-coated balloon angioplasty right popliteal with 5 x 200 mm in.pact 5.  Balloon angioplasty of posterior tibial artery on the right with 2.5 mm balloon 6.  Minx device closure left common femoral artery  Antimicrobials:  Vancomycin  Zosyn  Subjective: Patient seen at bedside.  His foot is still very erythematous, toe black/necrotic.  Denies any other major complaints.   Objective: Vitals:   06/22/18 0649 06/22/18 1138 06/22/18 1212 06/22/18 1525  BP: 138/87 (!) 151/82  119/67  Pulse: 74 73  72  Resp: (!) 9 16 (!) 25 12  Temp: 98.4 F (36.9 C) 97.7 F (36.5 C)  98.1 F (36.7 C)  TempSrc: Oral Oral  Oral  SpO2: 96% 97%  98%  Weight: 79 kg     Height:        Intake/Output Summary (Last 24 hours) at 06/22/2018 1742 Last data filed at 06/22/2018 1530 Gross per 24 hour  Intake 850 ml  Output 1950 ml  Net -1100 ml   Filed Weights   06/19/18 1640 06/19/18 2200 06/22/18 0649  Weight: 80.3 kg 78.4 kg 79 kg    Examination:  General exam: Appears calm and comfortable  Respiratory system: Clear to auscultation. Respiratory effort normal. Cardiovascular  system: S1 & S2 heard, RRR. No JVD, murmurs, rubs, gallops or clicks. No pedal edema. Gastrointestinal system: Abdomen is nondistended, soft and nontender. No organomegaly or masses felt. Normal bowel sounds heard. Central nervous system: Alert and oriented. No focal neurological deficits. Extremities: Moves all 4 extremity.  Right great toe which is swollen, black discoloration, warm and erythematous and tender to touch in the dorsal area.  Skin: No rashes, lesions or ulcers Psychiatry: Judgement and insight appear normal. Mood & affect appropriate.     Data Reviewed: I have personally reviewed following labs and imaging studies  CBC: Recent Labs  Lab 06/19/18 1700 06/20/18 0341 06/22/18 0357  WBC 17.2* 11.7* 11.0*  NEUTROABS 12.8*  --   --   HGB 14.9 12.6* 12.2*  HCT 45.1 38.6* 36.5*  MCV 94.2 93.7 91.9  PLT 334 296 373   Basic Metabolic Panel: Recent Labs  Lab 06/19/18 1700 06/20/18 0341 06/22/18 0357  NA 137 139 137  K 4.2 3.8 3.5  CL 100 105 103  CO2 27 26 26   GLUCOSE 205* 115* 145*  BUN 10 7* <5*  CREATININE 0.72 0.80 0.69  CALCIUM 9.5 8.5* 8.4*   GFR: Estimated Creatinine Clearance: 90.6 mL/min (by C-G formula based on SCr of 0.69 mg/dL). Liver Function Tests: Recent Labs  Lab 06/19/18 1700  AST 14*  ALT 12  ALKPHOS 98  BILITOT 0.7  PROT 7.5  ALBUMIN 3.7   No results for input(s): LIPASE, AMYLASE in the last 168 hours. No results for input(s): AMMONIA in the last 168 hours. Coagulation Profile: Recent Labs  Lab 06/19/18 2107  INR 1.02   Cardiac Enzymes: No results for input(s): CKTOTAL, CKMB, CKMBINDEX, TROPONINI in the last 168 hours. BNP (last 3 results) No results for input(s): PROBNP in the last 8760 hours. HbA1C: Recent Labs    06/20/18 0341  HGBA1C 7.8*   CBG: Recent Labs  Lab 06/21/18 1155 06/21/18 1601 06/21/18 2201 06/22/18 0651 06/22/18 1141  GLUCAP 190* 119* 138* 157* 130*   Lipid Profile: No results for input(s):  CHOL, HDL, LDLCALC, TRIG, CHOLHDL, LDLDIRECT in the last 72 hours. Thyroid Function Tests: No results for input(s): TSH, T4TOTAL, FREET4, T3FREE, THYROIDAB in the last 72 hours. Anemia Panel: No results for input(s): VITAMINB12, FOLATE, FERRITIN, TIBC, IRON, RETICCTPCT in the last 72 hours. Urine analysis:    Component Value Date/Time   COLORURINE YELLOW 07/07/2012 2114   APPEARANCEUR CLEAR 07/07/2012 2114   LABSPEC 1.010 07/07/2012 2114   PHURINE 5.5 07/07/2012 2114   GLUCOSEU 100 (A) 07/07/2012 2114   HGBUR NEGATIVE 07/07/2012 2114   BILIRUBINUR NEGATIVE 07/07/2012 2114   KETONESUR NEGATIVE 07/07/2012 2114   PROTEINUR NEGATIVE 07/07/2012 2114   UROBILINOGEN 0.2 07/07/2012 2114   NITRITE NEGATIVE 07/07/2012 2114   LEUKOCYTESUR NEGATIVE 07/07/2012 2114   Sepsis Labs: @LABRCNTIP (procalcitonin:4,lacticidven:4)  ) Recent Results (from the past 240 hour(s))  Blood Culture (routine x 2)     Status:  None (Preliminary result)   Collection Time: 06/19/18  5:00 PM  Result Value Ref Range Status   Specimen Description BLOOD LEFT ANTECUBITAL  Final   Special Requests   Final    BOTTLES DRAWN AEROBIC AND ANAEROBIC Blood Culture adequate volume   Culture   Final    NO GROWTH 3 DAYS Performed at [facility], [address] [place], [place] [address]    Report Status PENDING  Incomplete  Blood Culture (routine x 2)     Status: None (Preliminary result)   Collection Time: 06/19/18  5:30 PM  Result Value Ref Range Status   Specimen Description BLOOD LEFT HAND  Final   Special Requests   Final    BOTTLES DRAWN AEROBIC ONLY Blood Culture results may not be optimal due to an inadequate volume of blood received in culture bottles   Culture   Final    NO GROWTH 3 DAYS Performed at [facility], [place] [address], [place], [place] [address]    Report Status PENDING  Incomplete  Surgical pcr screen     Status: None   Collection Time: 06/21/18 10:18 AM  Result Value Ref Range  Status   MRSA, PCR NEGATIVE NEGATIVE Final   Staphylococcus aureus NEGATIVE NEGATIVE Final    Comment: (NOTE) The Xpert SA Assay (FDA approved for NASAL specimens in patients 38 years of age and older), is one component of a comprehensive surveillance program. It is not intended to diagnose infection nor to guide or monitor treatment. Performed at [facility], [place] [address] [place], [place] [address]          Radiology Studies: No results found.   Scheduled Meds: . aspirin EC  81 mg Oral Daily  . clopidogrel  75 mg Oral Q breakfast  . heparin  5,000 Units Subcutaneous Q8H  . insulin aspart  0-5 Units Subcutaneous QHS  . insulin aspart  0-9 Units Subcutaneous TID WC  . rosuvastatin  10 mg Oral q1800  . sodium chloride flush  3 mL Intravenous Q12H   Continuous Infusions: . sodium chloride    . cefTRIAXone (ROCEPHIN)  IV 1 g (06/22/18 0630)  . vancomycin 1,250 mg (06/22/18 1028)     LOS: 3 days    Time spent: 25 minutes    Yaakov Guthrie, MD [facility] Pager [phone]  If 7PM-7AM, please contact night-coverage [email or web address] Password [email or web address] 06/22/2018, 5:42 PM

## 2018-06-23 LAB — GLUCOSE, CAPILLARY
Glucose-Capillary: 123 mg/dL — ABNORMAL HIGH (ref 70–99)
Glucose-Capillary: 119 mg/dL — ABNORMAL HIGH (ref 70–99)
Glucose-Capillary: 133 mg/dL — ABNORMAL HIGH (ref 70–99)
Glucose-Capillary: 143 mg/dL — ABNORMAL HIGH (ref 70–99)

## 2018-06-23 LAB — CBC
HCT: 35.8 % — ABNORMAL LOW (ref 39.0–52.0)
Hemoglobin: 12.2 g/dL — ABNORMAL LOW (ref 13.0–17.0)
MCH: 31.1 pg (ref 26.0–34.0)
MCHC: 34.1 g/dL (ref 30.0–36.0)
MCV: 91.3 fL (ref 80.0–100.0)
Platelets: 269 10*3/uL (ref 150–400)
RBC: 3.92 MIL/uL — ABNORMAL LOW (ref 4.22–5.81)
RDW: 12.3 % (ref 11.5–15.5)
WBC: 10 10*3/uL (ref 4.0–10.5)
nRBC: 0 % (ref 0.0–0.2)

## 2018-06-23 LAB — BASIC METABOLIC PANEL
Anion gap: 8 (ref 5–15)
BUN: 5 mg/dL — ABNORMAL LOW (ref 8–23)
CO2: 25 mmol/L (ref 22–32)
Calcium: 8.5 mg/dL — ABNORMAL LOW (ref 8.9–10.3)
Chloride: 102 mmol/L (ref 98–111)
Creatinine, Ser: 0.82 mg/dL (ref 0.61–1.24)
GFR calc Af Amer: >60 mL/min (ref >60)
GFR calc non Af Amer: >60 mL/min (ref >60)
Glucose, Bld: 143 mg/dL — ABNORMAL HIGH (ref 70–99)
Potassium: 3.7 mmol/L (ref 3.5–5.1)
Sodium: 135 mmol/L (ref 135–145)

## 2018-06-23 LAB — VANCOMYCIN, TROUGH: Vancomycin Tr: 15 ug/mL (ref 15–20)

## 2018-06-23 NOTE — Progress Notes (Signed)
PROGRESS NOTE  [name] ASN:[id number] DOB: 27-Mar-1956 DOA: 06/19/2018 PCP: Patient, No Pcp Per  HPI/Recap of past 24 hours:  [name] is a 62 year old male with medical history significant for diabetes , BPH herniated disks with chronic back pain current smoker but quit  5 days ago he is being admitted for right foot ulcer with gangrene.  He thought that he may have injured it about 10 days ago when he stepped on a rock after which he developed a large blister on the right great toe.  06/23/2018: Patient seen and examined at his bedside.  No acute events overnight.  He reports constant pain in his right great toe.  Pain medication adjusted to a shelf.  No other complaints.  Being followed by vascular surgery and orthopedic surgery.  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   Former smoker  Right great toe ulcer with gangrene Continue IV antibiotics Vascular surgery and orthopedic surgery following Possible amputation by orthopedic surgery Continue to monitor for any changes  Severe peripheral vascular disease status post ultrasound-guided cannulation left common femoral artery on 06/21/2018 by vascular surgery Continue antiplatelet and statin  Type 2 diabetes with hyperglycemia A1c 8.5 Continue insulin sliding scale  Sepsis secondary to cellulitis and gangrene of the right great toe Resolving Blood cultures negative to date Continue IV vancomycin and Zosyn  Chronic low back pain with herniated disc Continue pain management with Percocet as needed  Tobacco abuse Recently quit.  Patient encouraged to stay quit  DVT ppx: SQ Heparin Code Status:Full code Family Communication: None at bed side.  Disposition Plan: Anticipate discharge back to previous home environment Consults : Dr.  Marcelino Scot , [name], MD Ortho Dr. Servando Snare, vascular surgery    Procedures:  Procedure Performed: 1.Ultrasound-guided cannulation left common femoral artery 2.Aortogram bilateral lower extremity runoff 3.Jetstream atherectomy right popliteal artery 4.Drug-coated balloon angioplasty right popliteal with 5 x 200 mm in.pact 5.Balloon angioplasty of posterior tibial artery on the right with2.6mm balloon 6.Minx device closure left common femoral artery  Antimicrobials:  Vancomycin  Zosyn    Objective: Vitals:   06/22/18 1525 06/22/18 1952 06/23/18 0702 06/23/18 1124  BP: 119/67 132/84 128/76 132/82  Pulse: 72 79 67 84  Resp: 12 18 (!) 9 (!) 24  Temp: 98.1 F (36.7 C) 98.1 F (36.7 C) 98.4 F (36.9 C) 97.9 F (36.6 C)  TempSrc: Oral Oral Oral Oral  SpO2: 98% 95% 97% 96%  Weight:   80.1 kg   Height:        Intake/Output Summary (Last 24 hours) at 06/23/2018 1557 Last data filed at 06/23/2018 1300 Gross per 24 hour  Intake 830 ml  Output 1400 ml  Net -570 ml   Filed Weights   06/19/18 2200 06/22/18 0649 06/23/18 0702  Weight: 78.4 kg 79 kg 80.1 kg    Exam:  . General: 62 y.o. year-old male well developed well nourished in no acute distress.  Alert and oriented x3. . Cardiovascular: Regular rate and rhythm with no rubs or gallops.  No thyromegaly or JVD noted.   [name] Kitchen Respiratory: Clear to auscultation with no wheezes or rales. Good inspiratory effort. . Abdomen: Soft nontender nondistended with normal bowel sounds x4 quadrants. . Musculoskeletal: No lower extremity edema. 2/4 pulses in all 4 extremities. . Skin: Right  great toe gangrene with 1 out of 4 dorsalis pedis pulses bilaterally . Psychiatry: Mood is appropriate for condition and setting   Data Reviewed: CBC: Recent Labs  Lab 06/19/18 1700 06/20/18 0341 06/22/18 0357 06/23/18 0348  WBC 17.2* 11.7* 11.0* 10.0  NEUTROABS 12.8*  --   --   --   HGB 14.9 12.6* 12.2* 12.2*  HCT 45.1  38.6* 36.5* 35.8*  MCV 94.2 93.7 91.9 91.3  PLT 334 296 250 846   Basic Metabolic Panel: Recent Labs  Lab 06/19/18 1700 06/20/18 0341 06/22/18 0357 06/23/18 0348  NA 137 139 137 135  K 4.2 3.8 3.5 3.7  CL 100 105 103 102  CO2 27 26 26 25   GLUCOSE 205* 115* 145* 143*  BUN 10 7* <5* 5*  CREATININE 0.72 0.80 0.69 0.82  CALCIUM 9.5 8.5* 8.4* 8.5*   GFR: Estimated Creatinine Clearance: 88.4 mL/min (by C-G formula based on SCr of 0.82 mg/dL). Liver Function Tests: Recent Labs  Lab 06/19/18 1700  AST 14*  ALT 12  ALKPHOS 98  BILITOT 0.7  PROT 7.5  ALBUMIN 3.7   No results for input(s): LIPASE, AMYLASE in the last 168 hours. No results for input(s): AMMONIA in the last 168 hours. Coagulation Profile: Recent Labs  Lab 06/19/18 2107  INR 1.02   Cardiac Enzymes: No results for input(s): CKTOTAL, CKMB, CKMBINDEX, TROPONINI in the last 168 hours. BNP (last 3 results) No results for input(s): PROBNP in the last 8760 hours. HbA1C: No results for input(s): HGBA1C in the last 72 hours. CBG: Recent Labs  Lab 06/22/18 1141 06/22/18 1819 06/22/18 2111 06/23/18 0700 06/23/18 1126  GLUCAP 130* 144* 173* 123* 119*   Lipid Profile: No results for input(s): CHOL, HDL, LDLCALC, TRIG, CHOLHDL, LDLDIRECT in the last 72 hours. Thyroid Function Tests: No results for input(s): TSH, T4TOTAL, FREET4, T3FREE, THYROIDAB in the last 72 hours. Anemia Panel: No results for input(s): VITAMINB12, FOLATE, FERRITIN, TIBC, IRON, RETICCTPCT in the last 72 hours. Urine analysis:    Component Value Date/Time   COLORURINE YELLOW 07/07/2012 2114   APPEARANCEUR CLEAR 07/07/2012 2114   LABSPEC 1.010 07/07/2012 2114   PHURINE 5.5 07/07/2012 2114   GLUCOSEU 100 (A) 07/07/2012 2114   HGBUR NEGATIVE 07/07/2012 2114   BILIRUBINUR NEGATIVE 07/07/2012 2114   KETONESUR NEGATIVE 07/07/2012 2114   PROTEINUR NEGATIVE 07/07/2012 2114   UROBILINOGEN 0.2 07/07/2012 2114   NITRITE NEGATIVE 07/07/2012 2114    LEUKOCYTESUR NEGATIVE 07/07/2012 2114   Sepsis Labs: @LABRCNTIP (procalcitonin:4,lacticidven:4)  ) Recent Results (from the past 240 hour(s))  Blood Culture (routine x 2)     Status: None (Preliminary result)   Collection Time: 06/19/18  5:00 PM  Result Value Ref Range Status   Specimen Description BLOOD LEFT ANTECUBITAL  Final   Special Requests   Final    BOTTLES DRAWN AEROBIC AND ANAEROBIC Blood Culture adequate volume   Culture   Final    NO GROWTH 4 DAYS Performed at [facility], [place] [address], [place], [place] [address]    Report Status PENDING  Incomplete  Blood Culture (routine x 2)     Status: None (Preliminary result)   Collection Time: 06/19/18  5:30 PM  Result Value Ref Range Status   Specimen Description BLOOD LEFT HAND  Final   Special Requests   Final    BOTTLES DRAWN AEROBIC ONLY Blood Culture results may not be optimal due to an inadequate volume of blood received in culture bottles   Culture  Final    NO GROWTH 4 DAYS Performed at [facility], [place] [address] [place], [place] [address]    Report Status PENDING  Incomplete  Surgical pcr screen     Status: None   Collection Time: 06/21/18 10:18 AM  Result Value Ref Range Status   MRSA, PCR NEGATIVE NEGATIVE Final   Staphylococcus aureus NEGATIVE NEGATIVE Final    Comment: (NOTE) The Xpert SA Assay (FDA approved for NASAL specimens in patients 92 years of age and older), is one component of a comprehensive surveillance program. It is not intended to diagnose infection nor to guide or monitor treatment. Performed at [facility], [place] [address], [place], [place] [address]       Studies: No results found.  Scheduled Meds: . aspirin EC  81 mg Oral Daily  . clopidogrel  75 mg Oral Q breakfast  . heparin  5,000 Units Subcutaneous Q8H  . insulin aspart  0-5 Units Subcutaneous QHS  . insulin aspart  0-9 Units Subcutaneous TID WC  . rosuvastatin  10 mg Oral q1800  . sodium chloride  flush  3 mL Intravenous Q12H    Continuous Infusions: . sodium chloride    . piperacillin-tazobactam (ZOSYN)  IV 3.375 g (06/23/18 1259)  . vancomycin 1,250 mg (06/23/18 1016)     LOS: 4 days     Kayleen Memos, MD [facility] Pager [phone]  If 7PM-7AM, please contact night-coverage [email or web address] Password [email or web address] 06/23/2018, 3:57 PM

## 2018-06-23 NOTE — Progress Notes (Signed)
Pharmacy Antibiotic Note  [name] is a 62 y.o. male admitted on 06/19/2018 with sepsis and diabetic foot infection.  Pharmacy has been consulted for vancomycin and Zosyn dosing. Renal function has remained stable, cultures negative. Pt now s/p RLE revascularization for necrotic R grand toe to facilitate amputation. Vancomycin trough 15 mcg/ml on 1250mg  IV q8h, dose given somewhat early last night and trough drawn ~15 minutes later so true closer likely closer to 16 mcg/ml.  Plan: -Continue vancomycin 1250mg  IV q8h -Continue Zosyn 3.375g IV q8h EI -Monitor renal function, cultures, and orthopedics plans -Repeat vancomycin trough as needed  Height: 5' 8.11" (173 cm) Weight: 176 lb 9.4 oz (80.1 kg) IBW/kg (Calculated) : 68.65  Temp (24hrs), Avg:98.1 F (36.7 C), Min:97.7 F (36.5 C), Max:98.4 F (36.9 C)  Recent Labs  Lab 06/19/18 1700 06/19/18 1805 06/20/18 0341 06/21/18 1703 06/22/18 0357 06/23/18 0348  WBC 17.2*  --  11.7*  --  11.0* 10.0  CREATININE 0.72  --  0.80  --  0.69 0.82  LATICACIDVEN  --  1.86  --   --   --   --   VANCOTROUGH  --   --   --  14*  --   --     Estimated Creatinine Clearance: 88.4 mL/min (by C-G formula based on SCr of 0.82 mg/dL).    Allergies  Allergen Reactions  . Adhesive [Tape] Other (See Comments)    Causes blisters - pls use paper tape    Antimicrobials this admission: Vancomycin 11/16 >>  Ceftriaxone 11/17 >> 11/19 Zosyn 11/19 >>  Dose adjustments this admission: 11/18 Vancomycin Trough = 14 mcg/ml > increase to 1250mg  IV q8h 11/20 Vancomycin Trough = 15 mcg/ml > no adjustments  Microbiology results: 11/16 BCx: no growth x3 days 11/18 MRSA PCR: negative  Thank you for allowing pharmacy to be a part of this patient's care.  Arrie Senate, PharmD, BCPS Clinical Pharmacist [phone] Please check AMION for all [facility] numbers 06/23/2018

## 2018-06-24 ENCOUNTER — Ambulatory Visit (INDEPENDENT_AMBULATORY_CARE_PROVIDER_SITE_OTHER): Payer: Self-pay | Admitting: Physician Assistant

## 2018-06-24 ENCOUNTER — Telehealth: Payer: Self-pay | Admitting: Vascular Surgery

## 2018-06-24 LAB — GLUCOSE, CAPILLARY
Glucose-Capillary: 105 mg/dL — ABNORMAL HIGH (ref 70–99)
Glucose-Capillary: 112 mg/dL — ABNORMAL HIGH (ref 70–99)
Glucose-Capillary: 163 mg/dL — ABNORMAL HIGH (ref 70–99)
Glucose-Capillary: 192 mg/dL — ABNORMAL HIGH (ref 70–99)

## 2018-06-24 LAB — CULTURE, BLOOD (ROUTINE X 2)
Culture: NO GROWTH
Culture: NO GROWTH
Special Requests: ADEQUATE

## 2018-06-24 MED ORDER — CEFAZOLIN SODIUM-DEXTROSE 2-4 GM/100ML-% IV SOLN
2.0000 g | INTRAVENOUS | Status: DC
  Filled 2018-06-24: qty 100

## 2018-06-24 MED ORDER — CHLORHEXIDINE GLUCONATE 4 % EX LIQD
60.0000 mL | Freq: Once | CUTANEOUS | Status: AC
  Administered 2018-06-25: 4 via TOPICAL
  Filled 2018-06-24: qty 60

## 2018-06-24 NOTE — H&P (View-Only) (Signed)
Patient ID: [name], male   DOB: 1956-02-03, 62 y.o.   MRN: [id number] Patient was seen yesterday and this morning for evaluation of right lower extremity status post revascularization.  Patient  has excellent revascularization.  The ischemic changes to the great toe are decreasing.  I feel the patient should be able to heal a partial first ray amputation for the gangrene of the right great toe, will plan for this on Friday tomorrow.

## 2018-06-24 NOTE — Progress Notes (Signed)
PROGRESS NOTE  [name]:[id number] DOB: Jul 22, 1956 DOA: 06/19/2018 PCP: Patient, No Pcp Per  HPI/Recap of past 24 hours:  [name] is a 62 year old male with medical history significant for diabetes , BPH herniated disks with chronic back pain current smoker but quit  5 days ago he is being admitted for right foot ulcer with gangrene.  He thought that he may have injured it about 10 days ago when he stepped on a rock after which he developed a large blister on the right great toe.  06/23/2018: Constant pain in his right great toe.  Pain medication adjusted to a shelf.  No other complaints.  Being followed by vascular surgery and orthopedic surgery.  06/24/2018: Patient seen and examined at his bedside.  No acute events overnight.  He has no new complaints this morning.  Planned right great toe amputation on Friday by Dr. Sharol Given.  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 Continue to monitor for any changes Obtain early labs CBC,CMP,mag,phos  Severe peripheral vascular disease status post ultrasound-guided cannulation left common femoral artery on 06/21/2018 by vascular surgery Hold antiplatelet C/w statin  Type 2 diabetes with hyperglycemia A1c 8.5 Continue insulin sliding scale  Sepsis secondary to cellulitis and gangrene of the right great toe Resolving Blood cultures negative to date Continue IV vancomycin and Zosyn  Chronic low back pain with herniated disc Continue pain management with Percocet as needed  Tobacco abuse Recently quit.  Patient encouraged to stay quit  DVT ppx: SQ  Heparin Code Status:Full code Family Communication: None at bed side.  Disposition Plan: Anticipate discharge back to previous home environment Consults : Dr. Marcelino Scot , [name], MD Ortho Dr. Servando Snare, vascular surgery    Procedures:  Procedure Performed: 1.Ultrasound-guided cannulation left common femoral artery 2.Aortogram bilateral lower extremity runoff 3.Jetstream atherectomy right popliteal artery 4.Drug-coated balloon angioplasty right popliteal with 5 x 200 mm in.pact 5.Balloon angioplasty of posterior tibial artery on the right with2.47mm balloon 6.Minx device closure left common femoral artery  Antimicrobials:  Vancomycin  Zosyn    Objective: Vitals:   06/23/18 2133 06/23/18 2341 06/24/18 0640 06/24/18 0748  BP: (!) 142/82  139/81 (!) 145/78  Pulse: 75  71 83  Resp: 12 16 15 16   Temp: 98.2 F (36.8 C)  98.1 F (36.7 C) 97.7 F (36.5 C)  TempSrc: Oral  Oral Oral  SpO2: 99%  96% 99%  Weight:   80.1 kg   Height:        Intake/Output Summary (Last 24 hours) at 06/24/2018 1214 Last data filed at 06/24/2018 5956 Gross per 24 hour  Intake 1020.39 ml  Output 1485 ml  Net -464.61 ml   Filed Weights   06/22/18 0649 06/23/18 0702 06/24/18 0640  Weight: 79 kg 80.1 kg 80.1 kg    Exam:  . General: 62 y.o. year-old male well-developed well-nourished in no acute distress.  Alert and oriented x3.   . Cardiovascular: Regular rate and rhythm with no rubs or gallops.  No JVD or thyromegaly noted.   [name] Kitchen Respiratory: Clear to auscultation with no wheezes or rales.  Good respiratory effort. [name] Kitchen  Abdomen: Soft nontender nondistended with normal bowel sounds x4 quadrants. . Musculoskeletal: No lower extremity edema. 2/4 pulses in all 4 extremities. . Skin: Right great toe gangrene with 1 out of 4 dorsalis pedis pulses bilaterally . Psychiatry: Mood is appropriate for condition and setting   Data Reviewed: CBC: Recent Labs  Lab 06/19/18 1700  06/20/18 0341 06/22/18 0357 06/23/18 0348  WBC 17.2* 11.7* 11.0* 10.0  NEUTROABS 12.8*  --   --   --   HGB 14.9 12.6* 12.2* 12.2*  HCT 45.1 38.6* 36.5* 35.8*  MCV 94.2 93.7 91.9 91.3  PLT 334 296 250 341   Basic Metabolic Panel: Recent Labs  Lab 06/19/18 1700 06/20/18 0341 06/22/18 0357 06/23/18 0348  NA 137 139 137 135  K 4.2 3.8 3.5 3.7  CL 100 105 103 102  CO2 27 26 26 25   GLUCOSE 205* 115* 145* 143*  BUN 10 7* <5* 5*  CREATININE 0.72 0.80 0.69 0.82  CALCIUM 9.5 8.5* 8.4* 8.5*   GFR: Estimated Creatinine Clearance: 88.4 mL/min (by C-G formula based on SCr of 0.82 mg/dL). Liver Function Tests: Recent Labs  Lab 06/19/18 1700  AST 14*  ALT 12  ALKPHOS 98  BILITOT 0.7  PROT 7.5  ALBUMIN 3.7   No results for input(s): LIPASE, AMYLASE in the last 168 hours. No results for input(s): AMMONIA in the last 168 hours. Coagulation Profile: Recent Labs  Lab 06/19/18 2107  INR 1.02   Cardiac Enzymes: No results for input(s): CKTOTAL, CKMB, CKMBINDEX, TROPONINI in the last 168 hours. BNP (last 3 results) No results for input(s): PROBNP in the last 8760 hours. HbA1C: No results for input(s): HGBA1C in the last 72 hours. CBG: Recent Labs  Lab 06/23/18 0700 06/23/18 1126 06/23/18 1758 06/23/18 2135 06/24/18 0638  GLUCAP 123* 119* 133* 143* 105*   Lipid Profile: No results for input(s): CHOL, HDL, LDLCALC, TRIG, CHOLHDL, LDLDIRECT in the last 72 hours. Thyroid Function Tests: No results for input(s): TSH, T4TOTAL, FREET4, T3FREE, THYROIDAB in the last 72 hours. Anemia Panel: No results for input(s): VITAMINB12, FOLATE, FERRITIN, TIBC, IRON, RETICCTPCT in the last 72 hours. Urine analysis:    Component Value Date/Time   COLORURINE YELLOW 07/07/2012 2114   APPEARANCEUR CLEAR 07/07/2012 2114   LABSPEC 1.010 07/07/2012 2114   PHURINE 5.5 07/07/2012 2114   GLUCOSEU 100 (A) 07/07/2012 2114   HGBUR NEGATIVE 07/07/2012 2114   BILIRUBINUR NEGATIVE 07/07/2012 2114     KETONESUR NEGATIVE 07/07/2012 2114   PROTEINUR NEGATIVE 07/07/2012 2114   UROBILINOGEN 0.2 07/07/2012 2114   NITRITE NEGATIVE 07/07/2012 2114   LEUKOCYTESUR NEGATIVE 07/07/2012 2114   Sepsis Labs: @LABRCNTIP (procalcitonin:4,lacticidven:4)  ) Recent Results (from the past 240 hour(s))  Blood Culture (routine x 2)     Status: None (Preliminary result)   Collection Time: 06/19/18  5:00 PM  Result Value Ref Range Status   Specimen Description BLOOD LEFT ANTECUBITAL  Final   Special Requests   Final    BOTTLES DRAWN AEROBIC AND ANAEROBIC Blood Culture adequate volume   Culture   Final    NO GROWTH 4 DAYS Performed at [facility], [place] [address], [place],  [address]    Report Status PENDING  Incomplete  Blood Culture (routine x 2)     Status: None (Preliminary result)   Collection Time: 06/19/18  5:30 PM  Result Value Ref Range Status   Specimen Description BLOOD LEFT HAND  Final   Special Requests   Final  BOTTLES DRAWN AEROBIC ONLY Blood Culture results may not be optimal due to an inadequate volume of blood received in culture bottles   Culture   Final    NO GROWTH 4 DAYS Performed at [facility], [place] [address], [place], [place] [address]    Report Status PENDING  Incomplete  Surgical pcr screen     Status: None   Collection Time: 06/21/18 10:18 AM  Result Value Ref Range Status   MRSA, PCR NEGATIVE NEGATIVE Final   Staphylococcus aureus NEGATIVE NEGATIVE Final    Comment: (NOTE) The Xpert SA Assay (FDA approved for NASAL specimens in patients 61 years of age and older), is one component of a comprehensive surveillance program. It is not intended to diagnose infection nor to guide or monitor treatment. Performed at [facility], [place] [address], [place], [place] [address]       Studies: No results found.  Scheduled Meds: . aspirin EC  81 mg Oral Daily  . clopidogrel  75 mg Oral Q breakfast  . heparin  5,000 Units Subcutaneous Q8H  .  insulin aspart  0-5 Units Subcutaneous QHS  . insulin aspart  0-9 Units Subcutaneous TID WC  . rosuvastatin  10 mg Oral q1800  . sodium chloride flush  3 mL Intravenous Q12H    Continuous Infusions: . sodium chloride 10 mL/hr at 06/24/18 0204  . piperacillin-tazobactam (ZOSYN)  IV 3.375 g (06/24/18 1145)  . vancomycin 1,250 mg (06/24/18 1030)     LOS: 5 days     Kayleen Memos, MD [facility] Pager [phone]  If 7PM-7AM, please contact night-coverage [email or web address] Password [facility] 06/24/2018, 12:14 PM

## 2018-06-24 NOTE — Progress Notes (Signed)
Patient ID: [name], male   DOB: Oct 27, 1955, 62 y.o.   MRN: [id number] Patient was seen yesterday and this morning for evaluation of right lower extremity status post revascularization.  Patient  has excellent revascularization.  The ischemic changes to the great toe are decreasing.  I feel the patient should be able to heal a partial first ray amputation for the gangrene of the right great toe, will plan for this on Friday tomorrow.

## 2018-06-24 NOTE — Telephone Encounter (Signed)
-----   Message from Mena Goes, RN sent at 06/22/2018  3:15 PM EST ----- Regarding: 4-6 weeks with 2 labs   ----- Message ----- From: [name], MD Sent: 06/22/2018   3:03 PM EST To: [facility], [facility] [address]  Bob Daversa [phone] 03/15/1956  Inpatient level 1 Dx: pad  F/u in 4-6 weeks with rle duplex and abi's

## 2018-06-24 NOTE — Telephone Encounter (Signed)
sch appt lvm mld ltr 08/13/2018 2pm LE Art 3pm ABI 330pm f/u MD

## 2018-06-25 ENCOUNTER — Inpatient Hospital Stay (HOSPITAL_COMMUNITY): Payer: 59 | Admitting: Certified Registered"

## 2018-06-25 ENCOUNTER — Encounter (HOSPITAL_COMMUNITY)
Admission: EM | Disposition: A | Discharge status: Home / Self Care | Payer: Self-pay | Source: Home / Self Care | Attending: Internal Medicine

## 2018-06-25 ENCOUNTER — Encounter (HOSPITAL_COMMUNITY): Payer: Self-pay | Admitting: *Deleted

## 2018-06-25 DIAGNOSIS — Z9889 Other specified postprocedural states: Secondary | ICD-10-CM

## 2018-06-25 HISTORY — PX: AMPUTATION: SHX166

## 2018-06-25 LAB — CBC WITH DIFFERENTIAL/PLATELET
Abs Immature Granulocytes: 0.06 10*3/uL (ref 0.00–0.07)
Basophils Absolute: 0.1 10*3/uL (ref 0.0–0.1)
Basophils Relative: 1 %
Eosinophils Absolute: 0.3 10*3/uL (ref 0.0–0.5)
Eosinophils Relative: 3 %
HCT: 36.5 % — ABNORMAL LOW (ref 39.0–52.0)
Hemoglobin: 12.5 g/dL — ABNORMAL LOW (ref 13.0–17.0)
Immature Granulocytes: 1 %
Lymphocytes Relative: 16 %
Lymphs Abs: 1.8 10*3/uL (ref 0.7–4.0)
MCH: 31.2 pg (ref 26.0–34.0)
MCHC: 34.2 g/dL (ref 30.0–36.0)
MCV: 91 fL (ref 80.0–100.0)
Monocytes Absolute: 1.1 10*3/uL — ABNORMAL HIGH (ref 0.1–1.0)
Monocytes Relative: 9 %
Neutro Abs: 8 10*3/uL — ABNORMAL HIGH (ref 1.7–7.7)
Neutrophils Relative %: 70 %
Platelets: 289 10*3/uL (ref 150–400)
RBC: 4.01 MIL/uL — ABNORMAL LOW (ref 4.22–5.81)
RDW: 12.4 % (ref 11.5–15.5)
WBC: 11.3 10*3/uL — ABNORMAL HIGH (ref 4.0–10.5)
nRBC: 0 % (ref 0.0–0.2)

## 2018-06-25 LAB — PHOSPHORUS: Phosphorus: 3.8 mg/dL (ref 2.5–4.6)

## 2018-06-25 LAB — GLUCOSE, CAPILLARY
Glucose-Capillary: 131 mg/dL — ABNORMAL HIGH (ref 70–99)
Glucose-Capillary: 141 mg/dL — ABNORMAL HIGH (ref 70–99)
Glucose-Capillary: 212 mg/dL — ABNORMAL HIGH (ref 70–99)
Glucose-Capillary: 97 mg/dL (ref 70–99)

## 2018-06-25 LAB — COMPREHENSIVE METABOLIC PANEL
ALT: 29 U/L (ref 0–44)
AST: 24 U/L (ref 15–41)
Albumin: 2.6 g/dL — ABNORMAL LOW (ref 3.5–5.0)
Alkaline Phosphatase: 112 U/L (ref 38–126)
Anion gap: 8 (ref 5–15)
BUN: 8 mg/dL (ref 8–23)
CO2: 26 mmol/L (ref 22–32)
Calcium: 8.5 mg/dL — ABNORMAL LOW (ref 8.9–10.3)
Chloride: 103 mmol/L (ref 98–111)
Creatinine, Ser: 0.86 mg/dL (ref 0.61–1.24)
GFR calc Af Amer: >60 mL/min (ref >60)
GFR calc non Af Amer: >60 mL/min (ref >60)
Glucose, Bld: 161 mg/dL — ABNORMAL HIGH (ref 70–99)
Potassium: 3.5 mmol/L (ref 3.5–5.1)
Sodium: 137 mmol/L (ref 135–145)
Total Bilirubin: 0.6 mg/dL (ref 0.3–1.2)
Total Protein: 5.7 g/dL — ABNORMAL LOW (ref 6.5–8.1)

## 2018-06-25 LAB — BASIC METABOLIC PANEL
Anion gap: 6 (ref 5–15)
BUN: 8 mg/dL (ref 8–23)
CO2: 27 mmol/L (ref 22–32)
Calcium: 8.6 mg/dL — ABNORMAL LOW (ref 8.9–10.3)
Chloride: 105 mmol/L (ref 98–111)
Creatinine, Ser: 0.96 mg/dL (ref 0.61–1.24)
GFR calc Af Amer: >60 mL/min (ref >60)
GFR calc non Af Amer: >60 mL/min (ref >60)
Glucose, Bld: 147 mg/dL — ABNORMAL HIGH (ref 70–99)
Potassium: 3.8 mmol/L (ref 3.5–5.1)
Sodium: 138 mmol/L (ref 135–145)

## 2018-06-25 LAB — MAGNESIUM: Magnesium: 1.9 mg/dL (ref 1.7–2.4)

## 2018-06-25 SURGERY — AMPUTATION, FOOT, RAY
Anesthesia: General | Laterality: Right

## 2018-06-25 MED ORDER — ONDANSETRON HCL 4 MG PO TABS: 4.0000 mg | ORAL_TABLET | Freq: Four times a day (QID) | ORAL | Status: DC | PRN

## 2018-06-25 MED ORDER — FENTANYL CITRATE (PF) 250 MCG/5ML IJ SOLN
INTRAMUSCULAR | Status: AC
  Filled 2018-06-25: qty 5

## 2018-06-25 MED ORDER — POTASSIUM CHLORIDE CRYS ER 20 MEQ PO TBCR
40.0000 meq | EXTENDED_RELEASE_TABLET | Freq: Once | ORAL | Status: AC
  Administered 2018-06-25: 40 meq via ORAL
  Filled 2018-06-25: qty 2

## 2018-06-25 MED ORDER — LACTATED RINGERS IV SOLN
INTRAVENOUS | Status: DC
  Administered 2018-06-25: 10:00:00 via INTRAVENOUS

## 2018-06-25 MED ORDER — FENTANYL CITRATE (PF) 100 MCG/2ML IJ SOLN: 25.0000 ug | INTRAMUSCULAR | Status: DC | PRN

## 2018-06-25 MED ORDER — ONDANSETRON HCL 4 MG/2ML IJ SOLN: 4.0000 mg | Freq: Four times a day (QID) | INTRAMUSCULAR | Status: DC | PRN

## 2018-06-25 MED ORDER — SODIUM CHLORIDE 0.9 % IV SOLN
INTRAVENOUS | Status: DC
  Administered 2018-06-25: 14:00:00 via INTRAVENOUS

## 2018-06-25 MED ORDER — OXYCODONE-ACETAMINOPHEN 5-325 MG PO TABS
ORAL_TABLET | ORAL | Status: AC
  Filled 2018-06-25: qty 1

## 2018-06-25 MED ORDER — PROPOFOL 10 MG/ML IV BOLUS
INTRAVENOUS | Status: DC | PRN
  Administered 2018-06-25: 100 mg via INTRAVENOUS

## 2018-06-25 MED ORDER — METHOCARBAMOL 1000 MG/10ML IJ SOLN
500.0000 mg | Freq: Four times a day (QID) | INTRAVENOUS | Status: DC | PRN
  Filled 2018-06-25: qty 5

## 2018-06-25 MED ORDER — EPHEDRINE SULFATE 50 MG/ML IJ SOLN
INTRAMUSCULAR | Status: DC | PRN
  Administered 2018-06-25: 5 mg via INTRAVENOUS

## 2018-06-25 MED ORDER — METHOCARBAMOL 500 MG PO TABS
ORAL_TABLET | ORAL | Status: AC
  Filled 2018-06-25: qty 1

## 2018-06-25 MED ORDER — METOCLOPRAMIDE HCL 5 MG/ML IJ SOLN: 5.0000 mg | Freq: Three times a day (TID) | INTRAMUSCULAR | Status: DC | PRN

## 2018-06-25 MED ORDER — ONDANSETRON HCL 4 MG/2ML IJ SOLN
INTRAMUSCULAR | Status: DC | PRN
  Administered 2018-06-25: 4 mg via INTRAVENOUS

## 2018-06-25 MED ORDER — POLYETHYLENE GLYCOL 3350 17 G PO PACK: 17.0000 g | PACK | Freq: Every day | ORAL | Status: DC | PRN

## 2018-06-25 MED ORDER — FENTANYL CITRATE (PF) 250 MCG/5ML IJ SOLN
INTRAMUSCULAR | Status: DC | PRN
  Administered 2018-06-25: 100 ug via INTRAVENOUS

## 2018-06-25 MED ORDER — 0.9 % SODIUM CHLORIDE (POUR BTL) OPTIME
TOPICAL | Status: DC | PRN
  Administered 2018-06-25: 1000 mL

## 2018-06-25 MED ORDER — MAGNESIUM CITRATE PO SOLN: 1.0000 | Freq: Once | ORAL | Status: DC | PRN

## 2018-06-25 MED ORDER — MIDAZOLAM HCL 2 MG/2ML IJ SOLN
INTRAMUSCULAR | Status: AC
  Filled 2018-06-25: qty 2

## 2018-06-25 MED ORDER — LIDOCAINE 2% (20 MG/ML) 5 ML SYRINGE
INTRAMUSCULAR | Status: DC | PRN
  Administered 2018-06-25: 80 mg via INTRAVENOUS

## 2018-06-25 MED ORDER — BISACODYL 10 MG RE SUPP: 10.0000 mg | Freq: Every day | RECTAL | Status: DC | PRN

## 2018-06-25 MED ORDER — METHOCARBAMOL 500 MG PO TABS
500.0000 mg | ORAL_TABLET | Freq: Four times a day (QID) | ORAL | Status: DC | PRN
  Administered 2018-06-25 – 2018-06-27 (×7): 500 mg via ORAL
  Filled 2018-06-25 (×7): qty 1

## 2018-06-25 MED ORDER — METOCLOPRAMIDE HCL 5 MG PO TABS: 5.0000 mg | ORAL_TABLET | Freq: Three times a day (TID) | ORAL | Status: DC | PRN

## 2018-06-25 MED ORDER — MIDAZOLAM HCL 5 MG/5ML IJ SOLN
INTRAMUSCULAR | Status: DC | PRN
  Administered 2018-06-25: 2 mg via INTRAVENOUS

## 2018-06-25 MED ORDER — DOCUSATE SODIUM 100 MG PO CAPS
100.0000 mg | ORAL_CAPSULE | Freq: Two times a day (BID) | ORAL | Status: DC
  Administered 2018-06-25 – 2018-06-26 (×3): 100 mg via ORAL
  Filled 2018-06-25 (×4): qty 1

## 2018-06-25 SURGICAL SUPPLY — 28 items
BLADE SAW SGTL MED 73X18.5 STR (BLADE) ×1 IMPLANT
BLADE SURG 21 STRL SS (BLADE) ×2 IMPLANT
BNDG COHESIVE 4X5 TAN STRL (GAUZE/BANDAGES/DRESSINGS) ×2 IMPLANT
BNDG GAUZE ELAST 4 BULKY (GAUZE/BANDAGES/DRESSINGS) ×2 IMPLANT
COVER SURGICAL LIGHT HANDLE (MISCELLANEOUS) ×3 IMPLANT
COVER WAND RF STERILE (DRAPES) ×1 IMPLANT
DRAPE U-SHAPE 47X51 STRL (DRAPES) ×4 IMPLANT
DRSG ADAPTIC 3X8 NADH LF (GAUZE/BANDAGES/DRESSINGS) ×2 IMPLANT
DRSG PAD ABDOMINAL 8X10 ST (GAUZE/BANDAGES/DRESSINGS) ×3 IMPLANT
DURAPREP 26ML APPLICATOR (WOUND CARE) ×2 IMPLANT
ELECT REM PT RETURN 9FT ADLT (ELECTROSURGICAL) ×2
ELECTRODE REM PT RTRN 9FT ADLT (ELECTROSURGICAL) ×1 IMPLANT
GAUZE SPONGE 4X4 12PLY STRL (GAUZE/BANDAGES/DRESSINGS) ×2 IMPLANT
GLOVE BIOGEL PI IND STRL 9 (GLOVE) ×1 IMPLANT
GLOVE BIOGEL PI INDICATOR 9 (GLOVE) ×1
GLOVE SURG ORTHO 9.0 STRL STRW (GLOVE) ×2 IMPLANT
GOWN STRL REUS W/ TWL XL LVL3 (GOWN DISPOSABLE) ×2 IMPLANT
GOWN STRL REUS W/TWL XL LVL3 (GOWN DISPOSABLE) ×4
KIT BASIN OR (CUSTOM PROCEDURE TRAY) ×2 IMPLANT
KIT TURNOVER KIT B (KITS) ×2 IMPLANT
NS IRRIG 1000ML POUR BTL (IV SOLUTION) ×2 IMPLANT
PACK ORTHO EXTREMITY (CUSTOM PROCEDURE TRAY) ×2 IMPLANT
PAD ARMBOARD 7.5X6 YLW CONV (MISCELLANEOUS) ×2 IMPLANT
STOCKINETTE IMPERVIOUS LG (DRAPES) IMPLANT
SUT ETHILON 2 0 PSLX (SUTURE) ×3 IMPLANT
TOWEL OR 17X26 10 PK STRL BLUE (TOWEL DISPOSABLE) ×2 IMPLANT
TUBE CONNECTING 12X1/4 (SUCTIONS) ×2 IMPLANT
YANKAUER SUCT BULB TIP NO VENT (SUCTIONS) ×2 IMPLANT

## 2018-06-25 NOTE — Interval H&P Note (Signed)
History and Physical Interval Note:  06/25/2018 7:20 AM  [name]  has presented today for surgery, with the diagnosis of Gangrene right Great Toe  The various methods of treatment have been discussed with the patient and family. After consideration of risks, benefits and other options for treatment, the patient has consented to  Procedure(s): RIGHT FOOT 1ST RAY AMPUTATION (Right) as a surgical intervention .  The patient's history has been reviewed, patient examined, no change in status, stable for surgery.  I have reviewed the patient's chart and labs.  Questions were answered to the patient's satisfaction.     [name]

## 2018-06-25 NOTE — Progress Notes (Signed)
Orthopedic Tech Progress Note Patient Details:  [name] March 01, 1956 [id number]  Ortho Devices Type of Ortho Device: Postop shoe/boot Ortho Device/Splint Interventions: Application   Post Interventions Patient Tolerated: Well Instructions Provided: Care of device   [name] 06/25/2018, 6:27 PM

## 2018-06-25 NOTE — Anesthesia Procedure Notes (Signed)
Procedure Name: LMA Insertion Performed by: Cleda Daub, CRNA Pre-anesthesia Checklist: Patient identified, Emergency Drugs available, Suction available, Patient being monitored and Timeout performed Patient Re-evaluated:Patient Re-evaluated prior to induction Oxygen Delivery Method: Circle system utilized Preoxygenation: Pre-oxygenation with 100% oxygen Induction Type: IV induction LMA: LMA inserted LMA Size: 5.0 Number of attempts: 1 Placement Confirmation: positive ETCO2 and breath sounds checked- equal and bilateral Tube secured with: Tape Dental Injury: Teeth and Oropharynx as per pre-operative assessment

## 2018-06-25 NOTE — Op Note (Signed)
06/25/2018  11:27 AM  PATIENT:  [name]    PRE-OPERATIVE DIAGNOSIS:  Gangrene right Great Toe  POST-OPERATIVE DIAGNOSIS:  Same  PROCEDURE:  RIGHT FOOT 1ST RAY AMPUTATION  Local tissue rearrangement for wound closure 7 x 4 cm. Application of 13 cm Praveena wound VAC.   SURGEON:  [name], MD  PHYSICIAN ASSISTANT:None ANESTHESIA:   General  PREOPERATIVE INDICATIONS:  [name] is a  62 y.o. male with a diagnosis of Gangrene right Great Toe who failed conservative measures and elected for surgical management.    The risks benefits and alternatives were discussed with the patient preoperatively including but not limited to the risks of infection, bleeding, nerve injury, cardiopulmonary complications, the need for revision surgery, among others, and the patient was willing to proceed.  OPERATIVE IMPLANTS: Praveena wound VAC  @ENCIMAGES @  OPERATIVE FINDINGS: Good petechial bleeding  OPERATIVE PROCEDURE: Patient was brought to the operating room and underwent a general anesthetic.  After adequate levels anesthesia were obtained patient's right lower extremity was prepped using DuraPrep draped into a sterile field a timeout was called.  A racquet incision was made around the dry gangrenous tissue around the first ray.  The first ray and the gangrenous tissue was resected in one block of tissue.  A bone cutter was used to amputate through the first metatarsal.  This was beveled plantarly.  Electrocautery was used for hemostasis.  The wound was irrigated with normal saline.  With resection of the necrotic tissue this left a wound that was 7 x 4 cm.  Local tissue rearrangement was used to close the wound with 2-0 nylon.  A Praveena wound VAC was applied this had a good suction fit patient was extubated taken the PACU in stable condition.   DISCHARGE PLANNING:  Antibiotic duration: 24 hours postoperatively  Weightbearing: Nonweightbearing on the right  Pain medication:  Continue current pain medication  Dressing care/ Wound VAC: Continue wound VAC for 1 week  Ambulatory devices: Crutches or walker  Discharge to: Anticipate discharge to home  Follow-up: In the office 1 week post operative.

## 2018-06-25 NOTE — Anesthesia Preprocedure Evaluation (Addendum)
Anesthesia Evaluation  Patient identified by MRN, date of birth, ID band Patient awake    Reviewed: Allergy & Precautions, NPO status , Patient's Chart, lab work & pertinent test results  Airway Mallampati: III  TM Distance: >3 FB Neck ROM: Full    Dental no notable dental hx. (+) Edentulous Upper, Edentulous Lower   Pulmonary neg pulmonary ROS, former smoker,    Pulmonary exam normal breath sounds clear to auscultation       Cardiovascular + Peripheral Vascular Disease  Normal cardiovascular exam Rhythm:Regular Rate:Normal  TTE 2013 - Left ventricle: The cavity size was normal. Systolic function was normal. The estimated ejection fraction was in the range of 55% to 60%. Wall motion was normal; there were no regional wall motion abnormalities. - Mitral valve: Mildly calcified annulus.   Neuro/Psych negative neurological ROS  negative psych ROS   GI/Hepatic negative GI ROS, Neg liver ROS,   Endo/Other  negative endocrine ROSdiabetes, Type 2, Oral Hypoglycemic Agents  Renal/GU negative Renal ROS  negative genitourinary   Musculoskeletal  (+) Arthritis ,   Abdominal   Peds  Hematology negative hematology ROS (+)   Anesthesia Other Findings   Reproductive/Obstetrics                            Anesthesia Physical Anesthesia Plan  ASA: III  Anesthesia Plan: General   Post-op Pain Management:    Induction: Intravenous  PONV Risk Score and Plan: 2 and Ondansetron and Midazolam  Airway Management Planned: LMA  Additional Equipment:   Intra-op Plan:   Post-operative Plan: Extubation in OR  Informed Consent: I have reviewed the patients History and Physical, chart, labs and discussed the procedure including the risks, benefits and alternatives for the proposed anesthesia with the patient or authorized representative who has indicated his/her understanding and acceptance.   Dental  advisory given  Plan Discussed with: CRNA  Anesthesia Plan Comments:        Anesthesia Quick Evaluation

## 2018-06-25 NOTE — Plan of Care (Signed)
  Problem: Clinical Measurements: Goal: Ability to maintain clinical measurements within normal limits will improve Outcome: Progressing   Problem: Skin Integrity: Goal: Risk for impaired skin integrity will decrease Outcome: Progressing   Problem: Health Behavior/Discharge Planning: Goal: Ability to safely manage health-related needs after discharge will improve Outcome: Progressing   Problem: Education: Goal: Knowledge of the prescribed therapeutic regimen will improve Outcome: Progressing

## 2018-06-25 NOTE — Progress Notes (Signed)
PROGRESS NOTE  [name] YIF:[id number] DOB: 1955-10-12 DOA: 06/19/2018 PCP: Patient, No Pcp Per  HPI/Recap of past 24 hours:  [name] is a 62 year old male with medical history significant for diabetes , BPH, herniated disks with chronic back pain, current smoker but quit 5 days ago.  He is being admitted for right foot ulcer with gangrene.  He thought that he may have injured it about 10 days ago when he stepped on a rock after which he developed a large blister on the right great toe. Being followed by vascular surgery and orthopedic surgery. 06/24/2018: Patient seen and examined at his bedside.  No acute events overnight.  He has no new complaints this morning.  Planned right great toe amputation on Friday by Dr. Sharol Given.   06/25/2018: Patient seen and examined at his bedside.  No acute events overnight.  Plan for amputation of his right great toe today by Dr. Sharol Given.  Assessment/Plan: Principal Problem:   Cellulitis of right lower extremity Active Problems:   Diabetes mellitus with peripheral vascular disease ([place])   Sepsis ([place])   Back pain   Gangrene of toe ([place])   Former smoker   Status post surgery  POD#0 post amputaion of Right great toe ulcer with gangrene Continue IV antibiotics Vascular surgery and orthopedic surgery following Continue to monitor closely for any changes Non weightbearing on right foot PT as recommended by orthopedic surgery  Severe peripheral vascular disease status post ultrasound-guided cannulation left common femoral artery on 06/21/2018 by vascular surgery Defer to orthopedic surgery to restart antiplatelet Continue statin  Type 2 diabetes with hyperglycemia A1c 8.5 Continue insulin sliding scale Goal blood sugar less than 180  Sepsis secondary to cellulitis and gangrene of the right great toe Resolving Blood cultures negative to date Continue IV vancomycin and Zosyn  Chronic low back pain with herniated disc Continue pain management  with Percocet as needed  Tobacco abuse Recently quit.  Patient encouraged to stay quit  DVT ppx: Per orthopedic surgery commendations Code Status:Full code Family Communication: None at bed side.  Disposition Plan: Anticipate discharge back to previous home environment Consults : Dr. Marcelino Scot , [name], MD Ortho Dr. Servando Snare, vascular surgery    Procedures:  Procedure Performed: 1.Ultrasound-guided cannulation left common femoral artery 2.Aortogram bilateral lower extremity runoff 3.Jetstream atherectomy right popliteal artery 4.Drug-coated balloon angioplasty right popliteal with 5 x 200 mm in.pact 5.Balloon angioplasty of posterior tibial artery on the right with2.10mm balloon 6.Minx device closure left common femoral artery Amputation on 06/25/2018 right great toe  Antimicrobials:  Vancomycin  Zosyn    Objective: Vitals:   06/25/18 1220 06/25/18 1247 06/25/18 1315 06/25/18 1319  BP: 115/79 132/74  131/81  Pulse: 65 72  71  Resp: 13 18  18   Temp:  98 F (36.7 C)  (!) 97.5 F (36.4 C)  TempSrc:    Oral  SpO2: 96% 100% 99% 97%  Weight:      Height:        Intake/Output Summary (Last 24 hours) at 06/25/2018 1537 Last data filed at 06/25/2018 1504 Gross per 24 hour  Intake 2494.69 ml  Output 1835 ml  Net 659.69 ml   Filed Weights   06/23/18 0702 06/24/18 0640 06/25/18 0932  Weight: 80.1 kg 80.1 kg 80 kg    Exam:  . General: 62 y.o. year-old male well-developed well-nourished in no acute distress.  Alert and oriented x3.   . Cardiovascular: Regular rate and rhythm with no rubs or  gallops.  No JVD or thyromegaly noted. [name] Kitchen Respiratory: Clear to auscultation with no wheezes or rales.  Good inspiratory effort. . Abdomen: Soft nontender nondistended with normal bowel sounds x4 quadrants. . Musculoskeletal: No lower extremity edema. 2/4 pulses in all 4 extremities. . Skin: Right great toe gangrene with 1 out of 4 dorsalis pedis  pulses bilaterally . Psychiatry: Mood is appropriate for condition and setting   Data Reviewed: CBC: Recent Labs  Lab 06/19/18 1700 06/20/18 0341 06/22/18 0357 06/23/18 0348 06/25/18 0246  WBC 17.2* 11.7* 11.0* 10.0 11.3*  NEUTROABS 12.8*  --   --   --  8.0*  HGB 14.9 12.6* 12.2* 12.2* 12.5*  HCT 45.1 38.6* 36.5* 35.8* 36.5*  MCV 94.2 93.7 91.9 91.3 91.0  PLT 334 296 250 269 606   Basic Metabolic Panel: Recent Labs  Lab 06/20/18 0341 06/22/18 0357 06/23/18 0348 06/25/18 0246 06/25/18 0601  NA 139 137 135 137 138  K 3.8 3.5 3.7 3.5 3.8  CL 105 103 102 103 105  CO2 26 26 25 26 27   GLUCOSE 115* 145* 143* 161* 147*  BUN 7* <5* 5* 8 8  CREATININE 0.80 0.69 0.82 0.86 0.96  CALCIUM 8.5* 8.4* 8.5* 8.5* 8.6*  MG  --   --   --  1.9  --   PHOS  --   --   --  3.8  --    GFR: Estimated Creatinine Clearance: 75.2 mL/min (by C-G formula based on SCr of 0.96 mg/dL). Liver Function Tests: Recent Labs  Lab 06/19/18 1700 06/25/18 0246  AST 14* 24  ALT 12 29  ALKPHOS 98 112  BILITOT 0.7 0.6  PROT 7.5 5.7*  ALBUMIN 3.7 2.6*   No results for input(s): LIPASE, AMYLASE in the last 168 hours. No results for input(s): AMMONIA in the last 168 hours. Coagulation Profile: Recent Labs  Lab 06/19/18 2107  INR 1.02   Cardiac Enzymes: No results for input(s): CKTOTAL, CKMB, CKMBINDEX, TROPONINI in the last 168 hours. BNP (last 3 results) No results for input(s): PROBNP in the last 8760 hours. HbA1C: No results for input(s): HGBA1C in the last 72 hours. CBG: Recent Labs  Lab 06/24/18 1218 06/24/18 1748 06/24/18 2051 06/25/18 0620 06/25/18 1121  GLUCAP 112* 163* 192* 131* 141*   Lipid Profile: No results for input(s): CHOL, HDL, LDLCALC, TRIG, CHOLHDL, LDLDIRECT in the last 72 hours. Thyroid Function Tests: No results for input(s): TSH, T4TOTAL, FREET4, T3FREE, THYROIDAB in the last 72 hours. Anemia Panel: No results for input(s): VITAMINB12, FOLATE, FERRITIN, TIBC,  IRON, RETICCTPCT in the last 72 hours. Urine analysis:    Component Value Date/Time   COLORURINE YELLOW 07/07/2012 2114   APPEARANCEUR CLEAR 07/07/2012 2114   LABSPEC 1.010 07/07/2012 2114   PHURINE 5.5 07/07/2012 2114   GLUCOSEU 100 (A) 07/07/2012 2114   HGBUR NEGATIVE 07/07/2012 2114   BILIRUBINUR NEGATIVE 07/07/2012 2114   KETONESUR NEGATIVE 07/07/2012 2114   PROTEINUR NEGATIVE 07/07/2012 2114   UROBILINOGEN 0.2 07/07/2012 2114   NITRITE NEGATIVE 07/07/2012 2114   LEUKOCYTESUR NEGATIVE 07/07/2012 2114   Sepsis Labs: @LABRCNTIP (procalcitonin:4,lacticidven:4)  ) Recent Results (from the past 240 hour(s))  Blood Culture (routine x 2)     Status: None   Collection Time: 06/19/18  5:00 PM  Result Value Ref Range Status   Specimen Description BLOOD LEFT ANTECUBITAL  Final   Special Requests   Final    BOTTLES DRAWN AEROBIC AND ANAEROBIC Blood Culture adequate volume   Culture   Final  NO GROWTH 5 DAYS Performed at [facility], [place] [address], [place], [place] [address]    Report Status 06/24/2018 FINAL  Final  Blood Culture (routine x 2)     Status: None   Collection Time: 06/19/18  5:30 PM  Result Value Ref Range Status   Specimen Description BLOOD LEFT HAND  Final   Special Requests   Final    BOTTLES DRAWN AEROBIC ONLY Blood Culture results may not be optimal due to an inadequate volume of blood received in culture bottles   Culture   Final    NO GROWTH 5 DAYS Performed at [facility], [place] [address], [place], [place] [address]    Report Status 06/24/2018 FINAL  Final  Surgical pcr screen     Status: None   Collection Time: 06/21/18 10:18 AM  Result Value Ref Range Status   MRSA, PCR NEGATIVE NEGATIVE Final   Staphylococcus aureus NEGATIVE NEGATIVE Final    Comment: (NOTE) The Xpert SA Assay (FDA approved for NASAL specimens in patients 69 years of age and older), is one component of a comprehensive surveillance program. It is not intended to  diagnose infection nor to guide or monitor treatment. Performed at [facility], [place] [address] [place], [place] [address]       Studies: No results found.  Scheduled Meds: . aspirin EC  81 mg Oral Daily  . clopidogrel  75 mg Oral Q breakfast  . docusate sodium  100 mg Oral BID  . heparin  5,000 Units Subcutaneous Q8H  . insulin aspart  0-5 Units Subcutaneous QHS  . insulin aspart  0-9 Units Subcutaneous TID WC  . methocarbamol      . oxyCODONE-acetaminophen      . rosuvastatin  10 mg Oral q1800  . sodium chloride flush  3 mL Intravenous Q12H    Continuous Infusions: . sodium chloride Stopped (06/25/18 0858)  . sodium chloride 10 mL/hr at 06/25/18 1504  . lactated ringers 10 mL/hr at 06/25/18 0946  . methocarbamol (ROBAXIN) IV    . piperacillin-tazobactam (ZOSYN)  IV 0 mL/hr at 06/25/18 0722  . vancomycin 0 mL/hr at 06/25/18 0353     LOS: 6 days     Kayleen Memos, MD [facility] Pager [phone]  If 7PM-7AM, please contact night-coverage [email or web address] Password [facility] 06/25/2018, 3:37 PM

## 2018-06-25 NOTE — Transfer of Care (Signed)
Immediate Anesthesia Transfer of Care Note  Patient: [name]  Procedure(s) Performed: RIGHT FOOT 1ST RAY AMPUTATION (Right )  Patient Location: PACU  Anesthesia Type:General  Level of Consciousness: awake, alert  and oriented  Airway & Oxygen Therapy: Patient Spontanous Breathing and Patient connected to face mask oxygen  Post-op Assessment: Report given to RN and Post -op Vital signs reviewed and stable  Post vital signs: Reviewed and stable  Last Vitals:  Vitals Value Taken Time  BP 120/85 06/25/2018 11:21 AM  Temp 36.7 C 06/25/2018 11:21 AM  Pulse 85 06/25/2018 11:24 AM  Resp 16 06/25/2018 11:24 AM  SpO2 97 % 06/25/2018 11:24 AM  Vitals shown include unvalidated device data.  Last Pain:  Vitals:   06/25/18 1121  TempSrc:   PainSc: 0-No pain      Patients Stated Pain Goal: 0 (84/78/41 2820)  Complications: No apparent anesthesia complications

## 2018-06-26 LAB — CBC WITH DIFFERENTIAL/PLATELET
Abs Immature Granulocytes: 0.06 10*3/uL (ref 0.00–0.07)
Basophils Absolute: 0.1 10*3/uL (ref 0.0–0.1)
Basophils Relative: 1 %
Eosinophils Absolute: 0.4 10*3/uL (ref 0.0–0.5)
Eosinophils Relative: 3 %
HCT: 37.6 % — ABNORMAL LOW (ref 39.0–52.0)
Hemoglobin: 12.2 g/dL — ABNORMAL LOW (ref 13.0–17.0)
Immature Granulocytes: 1 %
Lymphocytes Relative: 19 %
Lymphs Abs: 2.3 10*3/uL (ref 0.7–4.0)
MCH: 29.8 pg (ref 26.0–34.0)
MCHC: 32.4 g/dL (ref 30.0–36.0)
MCV: 91.9 fL (ref 80.0–100.0)
Monocytes Absolute: 1.2 10*3/uL — ABNORMAL HIGH (ref 0.1–1.0)
Monocytes Relative: 10 %
Neutro Abs: 8.5 10*3/uL — ABNORMAL HIGH (ref 1.7–7.7)
Neutrophils Relative %: 66 %
Platelets: 301 10*3/uL (ref 150–400)
RBC: 4.09 MIL/uL — ABNORMAL LOW (ref 4.22–5.81)
RDW: 12.6 % (ref 11.5–15.5)
WBC: 12.6 10*3/uL — ABNORMAL HIGH (ref 4.0–10.5)
nRBC: 0 % (ref 0.0–0.2)

## 2018-06-26 LAB — GLUCOSE, CAPILLARY
Glucose-Capillary: 127 mg/dL — ABNORMAL HIGH (ref 70–99)
Glucose-Capillary: 158 mg/dL — ABNORMAL HIGH (ref 70–99)
Glucose-Capillary: 149 mg/dL — ABNORMAL HIGH (ref 70–99)
Glucose-Capillary: 115 mg/dL — ABNORMAL HIGH (ref 70–99)

## 2018-06-26 LAB — COMPREHENSIVE METABOLIC PANEL
ALT: 14 U/L (ref 0–44)
AST: 24 U/L (ref 15–41)
Albumin: 2.7 g/dL — ABNORMAL LOW (ref 3.5–5.0)
Alkaline Phosphatase: 56 U/L (ref 38–126)
Anion gap: 6 (ref 5–15)
BUN: 10 mg/dL (ref 8–23)
CO2: 24 mmol/L (ref 22–32)
Calcium: 7.8 mg/dL — ABNORMAL LOW (ref 8.9–10.3)
Chloride: 107 mmol/L (ref 98–111)
Creatinine, Ser: 0.79 mg/dL (ref 0.61–1.24)
GFR calc Af Amer: >60 mL/min (ref >60)
GFR calc non Af Amer: >60 mL/min (ref >60)
Glucose, Bld: 131 mg/dL — ABNORMAL HIGH (ref 70–99)
Potassium: 3.6 mmol/L (ref 3.5–5.1)
Sodium: 137 mmol/L (ref 135–145)
Total Bilirubin: 0.8 mg/dL (ref 0.3–1.2)
Total Protein: 6 g/dL — ABNORMAL LOW (ref 6.5–8.1)

## 2018-06-26 LAB — PHOSPHORUS: Phosphorus: 2.5 mg/dL (ref 2.5–4.6)

## 2018-06-26 LAB — MAGNESIUM: Magnesium: 2.1 mg/dL (ref 1.7–2.4)

## 2018-06-26 NOTE — Plan of Care (Signed)
  Problem: Education: Goal: Knowledge of General Education information will improve Description Including pain rating scale, medication(s)/side effects and non-pharmacologic comfort measures Outcome: Progressing   Problem: Health Behavior/Discharge Planning: Goal: Ability to manage health-related needs will improve Outcome: Progressing   Problem: Clinical Measurements: Goal: Ability to maintain clinical measurements within normal limits will improve Outcome: Progressing Goal: Will remain free from infection Outcome: Progressing Goal: Diagnostic test results will improve Outcome: Progressing Goal: Respiratory complications will improve Outcome: Progressing Goal: Cardiovascular complication will be avoided Outcome: Progressing   Problem: Skin Integrity: Goal: Risk for impaired skin integrity will decrease Outcome: Progressing   Problem: Education: Goal: Understanding of CV disease, CV risk reduction, and recovery process will improve Outcome: Progressing Goal: Individualized Educational Video(s) Outcome: Progressing   Problem: Health Behavior/Discharge Planning: Goal: Ability to safely manage health-related needs after discharge will improve Outcome: Progressing   Problem: Education: Goal: Knowledge of the prescribed therapeutic regimen will improve Outcome: Progressing Goal: Ability to verbalize activity precautions or restrictions will improve Outcome: Progressing Goal: Understanding of discharge needs will improve Outcome: Progressing   Problem: Activity: Goal: Ability to perform//tolerate increased activity and mobilize with assistive devices will improve Outcome: Progressing   Problem: Clinical Measurements: Goal: Postoperative complications will be avoided or minimized Outcome: Progressing   Problem: Self-Care: Goal: Ability to meet self-care needs will improve Outcome: Progressing   Problem: Self-Concept: Goal: Ability to maintain and perform role  responsibilities to the fullest extent possible will improve Outcome: Progressing   Problem: Pain Management: Goal: Pain level will decrease with appropriate interventions Outcome: Progressing   Problem: Skin Integrity: Goal: Demonstration of wound healing without infection will improve Outcome: Progressing

## 2018-06-26 NOTE — Progress Notes (Signed)
PROGRESS NOTE  [name] YQI:[id number] DOB: 19-Dec-1955 DOA: 06/19/2018 PCP: Patient, No Pcp Per  Brief narrative:   [name] is a 62 year old male with medical history significant for diabetes , BPH, herniated disks with chronic back pain, current smoker but quit 5 days ago.  He is being admitted for right foot ulcer with gangrene.  He thought that he may have injured it about 10 days ago when he stepped on a rock after which he developed a large blister on the right great toe. Being followed by vascular surgery and orthopedic surgery. 06/24/2018: Patient seen and examined at his bedside.  No acute events overnight.  He has no new complaints this morning.  S/P left  great toe amputation  by Dr. Sharol Given on 06/25/2018    Assessment/Plan: Principal Problem:   Cellulitis of right lower extremity Active Problems:   Diabetes mellitus with peripheral vascular disease ([place])   Sepsis ([place])   Back pain   Gangrene of toe ([place])   Former smoker   Status post surgery  Gangrene of right great toe  -  post amputaion of Right great toe ulcer with gangrene, currently on wound VAC, he was empirically on vancomycin and Zosyn, he was kept 24 hours on it after surgery, will DC antibiotics today. -Plan to discharge home tomorrow as discussed with orthopedic today  Severe peripheral vascular disease status post ultrasound-guided cannulation left common femoral artery on 06/21/2018 by vascular surgery -Resume aspirin tomorrow  Type 2 diabetes with hyperglycemia A1c 8.5 Continue insulin sliding scale Goal blood sugar less than 180  Sepsis secondary to cellulitis and gangrene of the right great toe Resolving Blood cultures negative to date Stop antibiotics 02/23/2018  Chronic low back pain with herniated disc Continue pain management with Percocet as needed  Tobacco abuse Recently quit.  Patient encouraged to stay quit  DVT ppx: [place] Code Status:Full code Family Communication: None  at bed side.  Disposition Plan: Anticipate discharge back to previous home environment Consults : Dr. Marcelino Scot , [name], MD Ortho Dr. Servando Snare, vascular surgery    Procedures:  Procedure Performed: 1.Ultrasound-guided cannulation left common femoral artery 2.Aortogram bilateral lower extremity runoff 3.Jetstream atherectomy right popliteal artery 4.Drug-coated balloon angioplasty right popliteal with 5 x 200 mm in.pact 5.Balloon angioplasty of posterior tibial artery on the right with2.75mm balloon 6.Minx device closure left common femoral artery  Amputation on 06/25/2018 right great toe with Dr. Sharol Given 06/25/2018  Antimicrobials:  Vancomycin  Zosyn    Objective: Vitals:   06/25/18 2336 06/26/18 0505 06/26/18 1104 06/26/18 1411  BP: 131/90 135/86 137/86 (!) 125/92  Pulse: 75 72 73 67  Resp:   20 20  Temp: 97.9 F (36.6 C) 98 F (36.7 C) 98.4 F (36.9 C) 98.2 F (36.8 C)  TempSrc: Oral Oral  Oral  SpO2: 93% 96% 94% 93%  Weight:      Height:        Intake/Output Summary (Last 24 hours) at 06/26/2018 1413 Last data filed at 06/26/2018 1300 Gross per 24 hour  Intake 2224.69 ml  Output 2470 ml  Net -245.31 ml   Filed Weights   06/23/18 0702 06/24/18 0640 06/25/18 0932  Weight: 80.1 kg 80.1 kg 80 kg    Exam:  Awake Alert, Oriented X 3, No new F.N deficits, Normal affect Symmetrical Chest wall movement, Good air movement bilaterally, CTAB RRR,No Gallops,Rubs or new Murmurs, No Parasternal Heave +ve B.Sounds, Abd Soft, No tenderness, No rebound - guarding or rigidity.  Left great toe status post amputation on wound VAC    Data Reviewed: CBC: Recent Labs  Lab 06/19/18 1700 06/20/18 0341 06/22/18 0357 06/23/18 0348 06/25/18 0246 06/26/18 0313  WBC 17.2* 11.7* 11.0* 10.0 11.3* 12.6*  NEUTROABS 12.8*  --   --   --  8.0* 8.5*  HGB 14.9 12.6* 12.2* 12.2* 12.5* 12.2*  HCT 45.1 38.6* 36.5* 35.8* 36.5* 37.6*  MCV 94.2 93.7 91.9  91.3 91.0 91.9  PLT 334 296 250 269 289 428   Basic Metabolic Panel: Recent Labs  Lab 06/22/18 0357 06/23/18 0348 06/25/18 0246 06/25/18 0601 06/26/18 0538  NA 137 135 137 138 137  K 3.5 3.7 3.5 3.8 3.6  CL 103 102 103 105 107  CO2 26 25 26 27 24   GLUCOSE 145* 143* 161* 147* 131*  BUN <5* 5* 8 8 10   CREATININE 0.69 0.82 0.86 0.96 0.79  CALCIUM 8.4* 8.5* 8.5* 8.6* 7.8*  MG  --   --  1.9  --  2.1  PHOS  --   --  3.8  --  2.5   GFR: Estimated Creatinine Clearance: 90.3 mL/min (by C-G formula based on SCr of 0.79 mg/dL). Liver Function Tests: Recent Labs  Lab 06/19/18 1700 06/25/18 0246 06/26/18 0538  AST 14* 24 24  ALT 12 29 14   ALKPHOS 98 112 56  BILITOT 0.7 0.6 0.8  PROT 7.5 5.7* 6.0*  ALBUMIN 3.7 2.6* 2.7*   No results for input(s): LIPASE, AMYLASE in the last 168 hours. No results for input(s): AMMONIA in the last 168 hours. Coagulation Profile: Recent Labs  Lab 06/19/18 2107  INR 1.02   Cardiac Enzymes: No results for input(s): CKTOTAL, CKMB, CKMBINDEX, TROPONINI in the last 168 hours. BNP (last 3 results) No results for input(s): PROBNP in the last 8760 hours. HbA1C: No results for input(s): HGBA1C in the last 72 hours. CBG: Recent Labs  Lab 06/25/18 1121 06/25/18 1621 06/25/18 2115 06/26/18 0620 06/26/18 1122  GLUCAP 141* 212* 97 127* 158*   Lipid Profile: No results for input(s): CHOL, HDL, LDLCALC, TRIG, CHOLHDL, LDLDIRECT in the last 72 hours. Thyroid Function Tests: No results for input(s): TSH, T4TOTAL, FREET4, T3FREE, THYROIDAB in the last 72 hours. Anemia Panel: No results for input(s): VITAMINB12, FOLATE, FERRITIN, TIBC, IRON, RETICCTPCT in the last 72 hours. Urine analysis:    Component Value Date/Time   COLORURINE YELLOW 07/07/2012 2114   APPEARANCEUR CLEAR 07/07/2012 2114   LABSPEC 1.010 07/07/2012 2114   PHURINE 5.5 07/07/2012 2114   GLUCOSEU 100 (A) 07/07/2012 2114   HGBUR NEGATIVE 07/07/2012 2114   BILIRUBINUR NEGATIVE  07/07/2012 2114   KETONESUR NEGATIVE 07/07/2012 2114   PROTEINUR NEGATIVE 07/07/2012 2114   UROBILINOGEN 0.2 07/07/2012 2114   NITRITE NEGATIVE 07/07/2012 2114   LEUKOCYTESUR NEGATIVE 07/07/2012 2114   Sepsis Labs: @LABRCNTIP (JGOTLXBWIOMBT:5,HRCBULAGTXM:4)  ) Recent Results (from the past 240 hour(s))  Blood Culture (routine x 2)     Status: None   Collection Time: 06/19/18  5:00 PM  Result Value Ref Range Status   Specimen Description BLOOD LEFT ANTECUBITAL  Final   Special Requests   Final    BOTTLES DRAWN AEROBIC AND ANAEROBIC Blood Culture adequate volume   Culture   Final    NO GROWTH 5 DAYS Performed at [facility], [place] [address], [place], [place] [address]    Report Status 06/24/2018 FINAL  Final  Blood Culture (routine x 2)     Status: None   Collection Time: 06/19/18  5:30 PM  Result Value Ref Range Status   Specimen Description BLOOD LEFT HAND  Final   Special Requests   Final    BOTTLES DRAWN AEROBIC ONLY Blood Culture results may not be optimal due to an inadequate volume of blood received in culture bottles   Culture   Final    NO GROWTH 5 DAYS Performed at [place] [address] [place], [place] [address]    Report Status 06/24/2018 FINAL  Final  Surgical pcr screen     Status: None   Collection Time: 06/21/18 10:18 AM  Result Value Ref Range Status   MRSA, PCR NEGATIVE NEGATIVE Final   Staphylococcus aureus NEGATIVE NEGATIVE Final    Comment: (NOTE) The Xpert SA Assay (FDA approved for NASAL specimens in patients 3 years of age and older), is one component of a comprehensive surveillance program. It is not intended to diagnose infection nor to guide or monitor treatment. Performed at [facility], [place] [address] [place], [place] [address]       Studies: No results found.  Scheduled Meds: . aspirin EC  81 mg Oral Daily  . clopidogrel  75 mg Oral Q breakfast  . docusate sodium  100 mg Oral BID  . heparin  5,000 Units  Subcutaneous Q8H  . insulin aspart  0-5 Units Subcutaneous QHS  . insulin aspart  0-9 Units Subcutaneous TID WC  . rosuvastatin  10 mg Oral q1800  . sodium chloride flush  3 mL Intravenous Q12H    Continuous Infusions: . sodium chloride Stopped (06/25/18 0858)  . sodium chloride 10 mL/hr at 06/25/18 1504  . lactated ringers 10 mL/hr at 06/25/18 0946  . methocarbamol (ROBAXIN) IV    . piperacillin-tazobactam (ZOSYN)  IV 3.375 g (06/26/18 1150)  . vancomycin 1,250 mg (06/26/18 0859)     LOS: 7 days     Phillips Climes, MD [facility]  If 7PM-7AM, please contact night-coverage [email or web address] Password [email or web address] 06/26/2018, 2:13 PM

## 2018-06-26 NOTE — Progress Notes (Addendum)
   Subjective: 1 Day Post-Op Procedure(s) (LRB): RIGHT FOOT 1ST RAY AMPUTATION (Right) Patient reports pain as mild and moderate.    Objective: Vital signs in last 24 hours: Temp:  [97.5 F (36.4 C)-98.2 F (36.8 C)] 98 F (36.7 C) (11/23 0505) Pulse Rate:  [65-84] 72 (11/23 0505) Resp:  [12-18] 18 (11/22 1319) BP: (115-135)/(69-90) 135/86 (11/23 0505) SpO2:  [93 %-100 %] 96 % (11/23 0505)  Intake/Output from previous day: 11/22 0701 - 11/23 0700 In: 2074.7 [I.V.:687.3; IV Piggyback:1387.4] Out: 2575 [Urine:2570; Blood:5] Intake/Output this shift: Total I/O In: 240 [P.O.:240] Out: -   Recent Labs    06/25/18 0246 06/26/18 0313  HGB 12.5* 12.2*   Recent Labs    06/25/18 0246 06/26/18 0313  WBC 11.3* 12.6*  RBC 4.01* 4.09*  HCT 36.5* 37.6*  PLT 289 301   Recent Labs    06/25/18 0601 06/26/18 0538  NA 138 137  K 3.8 3.6  CL 105 107  CO2 27 24  BUN 8 10  CREATININE 0.96 0.79  GLUCOSE 147* 131*  CALCIUM 8.6* 7.8*   No results for input(s): LABPT, INR in the last 72 hours.  good VAC seal . has not been OOB . No results found.  Assessment/Plan: 1 Day Post-Op Procedure(s) (LRB): RIGHT FOOT 1ST RAY AMPUTATION (Right) Up with therapy, NWB right foot  Marybelle Killings 06/26/2018, 10:15 AM

## 2018-06-26 NOTE — Progress Notes (Signed)
Pt alert, able to make needs known. No acute distress noted. Pt watching TV during bedside report. Asked night shift RN what time he could get his next dose of pain medication.

## 2018-06-26 NOTE — Progress Notes (Signed)
Called by pts wife. Wife worried about pts missing items, including pts wallet and debit card. Tech said that the missing items could be in cath lab, but they are closed over the weekend. Security notified. Room pt was in was searched for items, but they were not found. Security stated that there are other cabinets and locked doors that they could not get into, it's possible his belongings are there.

## 2018-06-26 NOTE — Evaluation (Addendum)
Physical Therapy One time Evaluation Patient Details Name: [name] MRN: [id number] DOB: May 02, 1956 Today's Date: 06/26/2018   History of Present Illness  [name] is a 62 year old male with medical history significant for diabetes , BPH, herniated disks with chronic back pain, current smoker but quit 5 days ago.  He is being admitted for right foot ulcer with gangrene.  He thought that he may have injured it about 10 days ago when he stepped on a rock after which he developed a large blister on the right great toe. Being followed by vascular surgery and orthopedic surgery.  Right great toe amputation on 06/25/2018 with vAC placed.   Clinical Impression  Pt admitted with above diagnosis. Pt currently without significant functional limitations and is Modif I with RW.  Maintains NWB right LE without difficulty.   Pt does not need skilled PT services. Has assist at home.  Needs RW for home use.   Will sign off.    Follow Up Recommendations No PT follow up    Equipment Recommendations  Rolling walker with 5" wheels    Recommendations for Other Services       Precautions / Restrictions Precautions Precautions: Fall Precaution Comments: VAC right foot Restrictions Weight Bearing Restrictions: Yes RLE Weight Bearing: Non weight bearing      Mobility  Bed Mobility Overal bed mobility: Independent                Transfers Overall transfer level: Independent                  Ambulation/Gait Ambulation/Gait assistance: Modified independent (Device/Increase time) Gait Distance (Feet): 5 Feet Assistive device: Rolling walker (2 wheeled) Gait Pattern/deviations: Step-to pattern;Decreased stride length   Gait velocity interpretation: 1.31 - 2.62 ft/sec, indicative of limited community ambulator General Gait Details: Pt was able to stand with RW with no assist. Pt maintains weight bearing as well.   Stairs            Wheelchair Mobility    Modified Rankin  (Stroke Patients Only)       Balance Overall balance assessment: Needs assistance Sitting-balance support: No upper extremity supported;Feet supported Sitting balance-Leahy Scale: Good     Standing balance support: Bilateral upper extremity supported;During functional activity Standing balance-Leahy Scale: Poor Standing balance comment: relies on Ues for support.  Can remove one UE off walker and balance with supervision.  Good balance overall              High level balance activites: Direction changes;Turns;Sudden stops;Backward walking High Level Balance Comments: able to balance with all of above with RW             Pertinent Vitals/Pain Pain Assessment: 0-10 Pain Score: 7  Pain Location: right foot    Home Living Family/patient expects to be discharged to:: Private residence Living Arrangements: Spouse/significant other Available Help at Discharge: Family;Available 24 hours/day(wife 24 hours, son in evenings) Type of Home: House Home Access: Stairs to enter   [employer] of Steps: 1 small step Home Layout: One level Home Equipment: Bedside commode      Prior Function Level of Independence: Independent               Hand Dominance   Dominant Hand: Right    Extremity/Trunk Assessment   Upper Extremity Assessment Upper Extremity Assessment: Defer to OT evaluation    Lower Extremity Assessment Lower Extremity Assessment: RLE deficits/detail RLE Deficits / Details: grossly 4/5 except ankle 2/5 due to  pain    Cervical / Trunk Assessment Cervical / Trunk Assessment: Normal  Communication   Communication: No difficulties  Cognition Arousal/Alertness: Awake/alert Behavior During Therapy: WFL for tasks assessed/performed Overall Cognitive Status: Within Functional Limits for tasks assessed                                        General Comments      Exercises     Assessment/Plan    PT Assessment Patent does  not need any further PT services  PT Problem List         PT Treatment Interventions      PT Goals (Current goals can be found in the Care Plan section)  Acute Rehab PT Goals Patient Stated Goal: to go home PT Goal Formulation: All assessment and education complete, DC therapy    Frequency     Barriers to discharge        Co-evaluation               AM-PAC PT "6 Clicks" Mobility  Outcome Measure Help needed turning from your back to your side while in a flat bed without using bedrails?: None Help needed moving from lying on your back to sitting on the side of a flat bed without using bedrails?: None Help needed moving to and from a bed to a chair (including a wheelchair)?: None Help needed standing up from a chair using your arms (e.g., wheelchair or bedside chair)?: None Help needed to walk in hospital room?: None Help needed climbing 3-5 steps with a railing? : A Little 6 Click Score: 23    End of Session Equipment Utilized During Treatment: Gait belt Activity Tolerance: Patient tolerated treatment well Patient left: in bed;with call bell/phone within reach Nurse Communication: Mobility status PT Visit Diagnosis: Muscle weakness (generalized) (M62.81)    Time: 0375-4360 PT Time Calculation (min) (ACUTE ONLY): 30 min   Charges:   PT Evaluation $PT Eval Low Complexity: 1 Low PT Treatments $Gait Training: 8-22 mins        [place] Pager:  [phone]  Office:  [place] 06/26/2018, 12:56 PM

## 2018-06-27 LAB — GLUCOSE, CAPILLARY
Glucose-Capillary: 115 mg/dL — ABNORMAL HIGH (ref 70–99)
Glucose-Capillary: 173 mg/dL — ABNORMAL HIGH (ref 70–99)

## 2018-06-27 MED ORDER — PANTOPRAZOLE SODIUM 40 MG PO TBEC: 40.0000 mg | DELAYED_RELEASE_TABLET | Freq: Every day | ORAL | 0 refills | Status: DC

## 2018-06-27 MED ORDER — CLOPIDOGREL BISULFATE 75 MG PO TABS: 75.0000 mg | ORAL_TABLET | Freq: Every day | ORAL | 0 refills | Status: DC

## 2018-06-27 MED ORDER — ACETAMINOPHEN 325 MG PO TABS: 650.0000 mg | ORAL_TABLET | Freq: Four times a day (QID) | ORAL | Status: DC | PRN

## 2018-06-27 MED ORDER — OXYCODONE HCL 5 MG PO TABS: 5.0000 mg | ORAL_TABLET | Freq: Four times a day (QID) | ORAL | 0 refills | Status: DC | PRN

## 2018-06-27 MED ORDER — ROSUVASTATIN CALCIUM 10 MG PO TABS: 10.0000 mg | ORAL_TABLET | Freq: Every day | ORAL | 0 refills | Status: DC

## 2018-06-27 NOTE — Plan of Care (Signed)
  Problem: Education: Goal: Knowledge of General Education information will improve Description Including pain rating scale, medication(s)/side effects and non-pharmacologic comfort measures Outcome: Progressing   Problem: Health Behavior/Discharge Planning: Goal: Ability to manage health-related needs will improve Outcome: Progressing   Problem: Clinical Measurements: Goal: Ability to maintain clinical measurements within normal limits will improve Outcome: Progressing Goal: Will remain free from infection Outcome: Progressing Goal: Diagnostic test results will improve Outcome: Progressing Goal: Respiratory complications will improve Outcome: Progressing Goal: Cardiovascular complication will be avoided Outcome: Progressing   Problem: Skin Integrity: Goal: Risk for impaired skin integrity will decrease Outcome: Progressing   Problem: Education: Goal: Understanding of CV disease, CV risk reduction, and recovery process will improve Outcome: Progressing Goal: Individualized Educational Video(s) Outcome: Progressing   Problem: Health Behavior/Discharge Planning: Goal: Ability to safely manage health-related needs after discharge will improve Outcome: Progressing   Problem: Education: Goal: Knowledge of the prescribed therapeutic regimen will improve Outcome: Progressing Goal: Ability to verbalize activity precautions or restrictions will improve Outcome: Progressing Goal: Understanding of discharge needs will improve Outcome: Progressing   Problem: Activity: Goal: Ability to perform//tolerate increased activity and mobilize with assistive devices will improve Outcome: Progressing   Problem: Clinical Measurements: Goal: Postoperative complications will be avoided or minimized Outcome: Progressing   Problem: Self-Care: Goal: Ability to meet self-care needs will improve Outcome: Progressing   Problem: Self-Concept: Goal: Ability to maintain and perform role  responsibilities to the fullest extent possible will improve Outcome: Progressing   Problem: Pain Management: Goal: Pain level will decrease with appropriate interventions Outcome: Progressing   Problem: Skin Integrity: Goal: Demonstration of wound healing without infection will improve Outcome: Progressing

## 2018-06-27 NOTE — Discharge Summary (Addendum)
[name], is a 62 y.o. male  DOB [birth year]  MRN [id number].  Admission date:  06/19/2018  Admitting Physician  [name], MD  Discharge Date:  06/27/2018   Primary MD  Patient, No Pcp Per  Recommendations for primary care physician for things to follow:  -Please check CBC, BMP during next visit -To follow with Dr. Sharol Given in 1 week   Admission Diagnosis  Diabetes mellitus without complication ([place]) [Z30.0] Hyperglycemia [R73.9] Gangrene of toe ([place]) [I96] Status post surgery [Z98.890]   Discharge Diagnosis   Diabetes mellitus with peripheral vascular disease Great toe gangrene Right lower Extremity ischemia  Principal Problem:   Cellulitis of right lower extremity Active Problems:   Diabetes mellitus with peripheral vascular disease ([place])   Sepsis ([place])   Back pain   Gangrene of toe ([name])   Former smoker   Status post surgery      Past Medical History:  Diagnosis Date  . Arthritis   . Diabetes mellitus without complication ([place])   . Enlarged prostate   . Herniated lumbar intervertebral disc   . Neck pain     Past Surgical History:  Procedure Laterality Date  . ABDOMINAL SURGERY    . LOWER EXTREMITY ANGIOGRAPHY N/A 06/21/2018   Procedure: LOWER EXTREMITY ANGIOGRAPHY;  Surgeon: [name], MD;  Location: [facility];  Service: Cardiovascular;  Laterality: N/A;  . open chest    . PERIPHERAL VASCULAR ATHERECTOMY Right 06/21/2018   Procedure: PERIPHERAL VASCULAR ATHERECTOMY w/ DCB;  Surgeon: [name], MD;  Location: [facility];  Service: Cardiovascular;  Laterality: Right;  Popliteal       History of present illness and  Hospital Course:     Kindly see H&P for history of present illness and admission details, please review complete Labs, Consult reports and Test reports for all details in brief  HPI  from the history and  physical done on the day of admission 06/19/2018  HPI: [name] is a 62 y.o. male with medical history significant of diabetes mellitus, BPH, herniated lumbar disc, chronic back pain, former smoker (quit smoking 5 days ago), who presents with right foot pain.  Patient reports approximate 10 days ago he sustained a "stone bruise" when he stepped on a rock underneath his right great toe.He developed a large blister in the right great toe a few days later. The right great toe has gradually turned to red, purple, then black.  The right foot has also become erythematous, swelling and tender, which has been progressively spreading up to the lower leg.  Patient states that the pain is constant, moderate, sharp, nonradiating.  He denies fever or chills.  No chest pain, shortness breath, cough.  No nausea vomiting, diarrhea, abdominal pain, symptoms of UTI.  He states that he quit smoking 5 days ago. He states that he does not need nicotine patch.  ED Course: pt was found to have WBC 17.2, lactic acid of 1.86, electrolytes renal function okay, temperature normal, tachycardia, tachypnea  initially, oxygen saturation 96% on room air.  X-ray of right foot and right tibia/fibular is negative for bony fracture.  Patient is admitted to [facility] bed as inpatient.  Orthopedic surgeon, Dr. Marcelino Scot was consulted.  # ABI: showed right ABI indicates moderate to severe decrease in arterial blood flow. Left ABI indicates a moderate decrease in arterial blood flow. Right toe pressure is absent. Left TBI is within normal limits.   Hospital Course    [name] is a 62 year old male with medical history significant for diabetes , BPH, herniated disks with chronic back pain, current smoker but quit 5 days ago.  He is being admitted for right foot ulcer with gangrene. He thought that he may have injured it about 10 days ago when he stepped on a rock after which he developed a large blister on the right great toe. Being  followed by vascular surgery and orthopedic surgery.  At this post ultrasound-guided cannulation of left common femoral artery with to me, and balloon angioplasty, then  S/P left  great toe amputation  by Dr. Sharol Given on 06/25/2018.  Sepsis due to gangrene of right great toe  -Sepsis present on admission as he had leukocytosis, tachycardia and critical limb ischemia -  with known  severe peripheral vascular disease, please see discussion below, post ultrasound-guided cannulation with atherectomy, balloon angioplasty, been seen by Dr. Sharol Given, status post amputation of left great toe secondary to gangrene . -Shunt was kept empirically on IV vancomycin and Zosyn, which was discontinued 24 hours after amputation, no further antibiotics on discharge  -Currently on wound VAC, to be continued on discharge and to follow with Dr. Sharol Given in 1 week as discussed with orthopedic yesterday.  Severe peripheral vascular disease ischemia . -Angriness foot and subsequent ischemic infection, status post ultrasound-guided cannulation left common femoral artery atherectomy, and balloon angioplasty on 06/21/2018 by vascular surgery -Will be discharged on Plavix and statin per vascular surgery recommendation  Type 2 diabetes with hyperglycemia A1c 8.5 Resume home medicine on discharge   Chronic low back pain with herniated disc Continue pain management with Percocet as needed  Tobacco abuse Recentlyquit. Patient encouraged to stay quit  Discharge Condition:  Stable   Follow UP  Follow-up Information    Ermalene Postin, NP Follow up on 06/29/2018.   Specialty:  Family Medicine Why:  2:45 for hospital follow up and establish as new patient, bring insurance information and medications  Contact information: [place] [address] [phone]        [name], MD In 1 week.   Specialty:  Orthopedic Surgery Contact information: [place]  [address] [phone]             Discharge Instructions  and  Discharge Medications     Discharge Instructions    Discharge instructions   Complete by:  As directed    Follow with Primary MD  in 7 days   Get CBC, CMP,  checked  by Primary MD next visit.    Activity: As tolerated with Full fall precautions use walker/cane & assistance as needed   Disposition Home    Diet: Carb modified diet , with feeding assistance and aspiration precautions.  For Heart failure patients - Check your Weight same time everyday, if you gain over 2 pounds, or you develop in leg swelling, experience more shortness of breath or chest pain, call your Primary MD immediately. Follow Cardiac Low Salt Diet and 1.5 lit/day fluid restriction.  On your next visit with your primary care physician please Get Medicines reviewed and adjusted.   Please request your Prim.MD to go over all Hospital Tests and Procedure/Radiological results at the follow up, please get all Hospital records sent to your Prim MD by signing hospital release before you go home.   If you experience worsening of your admission symptoms, develop shortness of breath, life threatening emergency, suicidal or homicidal thoughts you must seek medical attention immediately by calling 911 or calling your MD immediately  if symptoms less severe.  You Must read complete instructions/literature along with all the possible adverse reactions/side effects for all the Medicines you take and that have been prescribed to you. Take any new Medicines after you have completely understood and accpet all the possible adverse reactions/side effects.   Do not drive, operating heavy machinery, perform activities at heights, swimming or participation in water activities or provide baby sitting services if your were admitted for syncope or siezures until you have seen by Primary MD or a Neurologist and advised to do so again.  Do not drive when taking Pain  medications.    Do not take more than prescribed Pain, Sleep and Anxiety Medications  Special Instructions: If you have smoked or chewed Tobacco  in the last 2 yrs please stop smoking, stop any regular Alcohol  and or any Recreational drug use.  Wear Seat belts while driving.   Please note  You were cared for by a hospitalist during your hospital stay. If you have any questions about your discharge medications or the care you received while you were in the hospital after you are discharged, you can call the unit and asked to speak with the hospitalist on call if the hospitalist that took care of you is not available. Once you are discharged, your primary care physician will handle any further medical issues. Please note that NO REFILLS for any discharge medications will be authorized once you are discharged, as it is imperative that you return to your primary care physician (or establish a relationship with a primary care physician if you do not have one) for your aftercare needs so that they can reassess your need for medications and monitor your lab values.     Allergies as of 06/27/2018      Reactions   Adhesive [tape] Other (See Comments)   Causes blisters - pls use paper tape      Medication List    STOP taking these medications   naproxen sodium 220 MG tablet Commonly known as:  ALEVE     TAKE these medications   acetaminophen 325 MG tablet Commonly known as:  TYLENOL Take 2 tablets (650 mg total) by mouth every 6 (six) hours as needed for mild pain or headache.   aspirin 81 MG EC tablet Take 1 tablet (81 mg total) by mouth daily.   clopidogrel 75 MG tablet Commonly known as:  PLAVIX Take 1 tablet (75 mg total) by mouth daily with breakfast. Start taking on:  06/28/2018   cyclobenzaprine 5 MG tablet Commonly known as:  FLEXERIL Take 1 tablet (5 mg total) by mouth 3 (three) times daily as needed for muscle spasms.   glipiZIDE 10 MG tablet Commonly known as:   GLUCOTROL Take 1 tablet (10 mg total) by mouth daily before breakfast.   oxyCODONE 5 MG immediate release tablet Commonly known as:  Oxy IR/ROXICODONE Take 1 tablet (5 mg total) by mouth every 6 (six) hours as needed for severe pain.  pantoprazole 40 MG tablet Commonly known as:  PROTONIX Take 1 tablet (40 mg total) by mouth daily.   rosuvastatin 10 MG tablet Commonly known as:  CRESTOR Take 1 tablet (10 mg total) by mouth daily at 6 PM.         Diet and Activity recommendation: See Discharge Instructions above   Consults obtained -  Vascular surgery Orthopedic   Major procedures and Radiology Reports - PLEASE review detailed and final reports for all details, in brief -  Procedure Performed: 1.Ultrasound-guided cannulation left common femoral artery 2.Aortogram bilateral lower extremity runoff 3.Jetstream atherectomy right popliteal artery 4.Drug-coated balloon angioplasty right popliteal with 5 x 200 mm in.pact 5.Balloon angioplasty of posterior tibial artery on the right with2.59mm balloon 6.Minx device closure left common femoral artery  Amputation on 06/25/2018 right great toe with Dr. Sharol Given 06/25/2018  Dg Tibia/fibula Right  Result Date: 06/19/2018 CLINICAL DATA:  Right foot and lower leg redness and swelling. EXAM: RIGHT TIBIA AND FIBULA - 2 VIEW COMPARISON:  Right foot radiographs obtained at the same time. FINDINGS: Mild patellofemoral spur formation. Previously described degenerative spurs involving the talotibial joint and calcaneus. No soft tissue gas, bone destruction or periosteal reaction. IMPRESSION: No acute abnormality. Degenerative changes. Electronically Signed   By: Claudie Revering M.D.   On: 06/19/2018 19:21   Dg Foot Complete Right  Result Date: 06/19/2018 CLINICAL DATA:  Right foot pain, redness and swelling. No known injury. Clinical concern for infection EXAM: RIGHT FOOT COMPLETE - 3+ VIEW COMPARISON:  None. FINDINGS: Mild to  moderate inferior calcaneal spur formation. Mild to moderate anterior talotibial spur formation. No visible soft tissue swelling, soft tissue gas, bone destruction or periosteal reaction. IMPRESSION: No acute abnormality.  Degenerative changes, as described above. Electronically Signed   By: Claudie Revering M.D.   On: 06/19/2018 18:18   Vas [place] Burnard Bunting With/wo Tbi  Result Date: 06/20/2018 LOWER EXTREMITY DOPPLER STUDY Indications: Gangrene. High Risk Factors: Diabetes, current smoker.  Comparison Study: No prior study on file Performing Technologist: Sharion Dove RVS  Examination Guidelines: A complete evaluation includes at minimum, Doppler waveform signals and systolic blood pressure reading at the level of bilateral brachial, anterior tibial, and posterior tibial arteries, when vessel segments are accessible. Bilateral testing is considered an integral part of a complete examination. Photoelectric Plethysmograph (PPG) waveforms and toe systolic pressure readings are included as required and additional duplex testing as needed. Limited examinations for reoccurring indications may be performed as noted.  ABI Findings: +---------+------------------+-----+-------------------+--------+ Right    Rt Pressure (mmHg)IndexWaveform           Comment  +---------+------------------+-----+-------------------+--------+ Brachial 142                    triphasic                   +---------+------------------+-----+-------------------+--------+ PTA      74                0.52 dampened monophasic         +---------+------------------+-----+-------------------+--------+ DP       71                0.50 dampened monophasic         +---------+------------------+-----+-------------------+--------+ Great Toe0                 0.00                             +---------+------------------+-----+-------------------+--------+ +---------+------------------+-----+----------+-------+  Left     Lt Pressure  (mmHg)IndexWaveform  Comment +---------+------------------+-----+----------+-------+ Brachial 136                    triphasic         +---------+------------------+-----+----------+-------+ PTA      102               0.72 monophasic        +---------+------------------+-----+----------+-------+ DP       83                0.58 monophasic        +---------+------------------+-----+----------+-------+ Great Toe112               0.79                   +---------+------------------+-----+----------+-------+ +-------+-----------+-----------+------------+------------+ ABI/TBIToday's ABIToday's TBIPrevious ABIPrevious TBI +-------+-----------+-----------+------------+------------+ Right  0.52       0                                   +-------+-----------+-----------+------------+------------+ Left   0.72       0.79                                +-------+-----------+-----------+------------+------------+  Summary: Right: Resting right ankle-brachial index indicates moderate right lower extremity arterial disease. The right toe-brachial index is abnormal. Left: Resting left ankle-brachial index indicates moderate left lower extremity arterial disease. The left toe-brachial index is normal.  *See table(s) above for measurements and observations.  Electronically signed by Ruta Hinds MD on 06/20/2018 at 9:13:48 AM.    Final     Micro Results     Recent Results (from the past 240 hour(s))  Blood Culture (routine x 2)     Status: None   Collection Time: 06/19/18  5:00 PM  Result Value Ref Range Status   Specimen Description BLOOD LEFT ANTECUBITAL  Final   Special Requests   Final    BOTTLES DRAWN AEROBIC AND ANAEROBIC Blood Culture adequate volume   Culture   Final    NO GROWTH 5 DAYS Performed at [facility], [address] [place], [place] [address]    Report Status 06/24/2018 FINAL  Final  Blood Culture (routine x 2)     Status: None   Collection  Time: 06/19/18  5:30 PM  Result Value Ref Range Status   Specimen Description BLOOD LEFT HAND  Final   Special Requests   Final    BOTTLES DRAWN AEROBIC ONLY Blood Culture results may not be optimal due to an inadequate volume of blood received in culture bottles   Culture   Final    NO GROWTH 5 DAYS Performed at [facility], [place] [address], [place], [place] [address]    Report Status 06/24/2018 FINAL  Final  Surgical pcr screen     Status: None   Collection Time: 06/21/18 10:18 AM  Result Value Ref Range Status   MRSA, PCR NEGATIVE NEGATIVE Final   Staphylococcus aureus NEGATIVE NEGATIVE Final    Comment: (NOTE) The Xpert SA Assay (FDA approved for NASAL specimens in patients 54 years of age and older), is one component of a comprehensive surveillance program. It is not intended to diagnose infection nor to guide or monitor treatment. Performed at [facility], [place],  [place] [address]        Today   Subjective:   [name] today has no headache,no chest or abdominal pain, he is afebrile, Patient is angry this morning that his belongings has been lost(pants and wallet) Objective:   Blood pressure 126/68, pulse 75, temperature 97.6 F (36.4 C), temperature source Oral, resp. rate 20, height 5\' 8"  (1.727 m), weight 80 kg, SpO2 98 %.   Intake/Output Summary (Last 24 hours) at 06/27/2018 1122 Last data filed at 06/27/2018 1000 Gross per 24 hour  Intake 1080 ml  Output 2700 ml  Net -1620 ml    Exam Awake Alert, Oriented x 3, No new F.N deficits, Normal affect Symmetrical Chest wall movement, RRR Abd Soft, Non tender,No rebound -guarding or rigidity. Right foot status post great toe amputation with good VAC seal.  Data Review   CBC w Diff:  Lab Results  Component Value Date   WBC 12.6 (H) 06/26/2018   HGB 12.2 (L) 06/26/2018   HCT 37.6 (L) 06/26/2018   PLT 301 06/26/2018   LYMPHOPCT 19 06/26/2018   MONOPCT 10 06/26/2018    EOSPCT 3 06/26/2018   BASOPCT 1 06/26/2018    CMP:  Lab Results  Component Value Date   NA 137 06/26/2018   K 3.6 06/26/2018   CL 107 06/26/2018   CO2 24 06/26/2018   BUN 10 06/26/2018   CREATININE 0.79 06/26/2018   PROT 6.0 (L) 06/26/2018   ALBUMIN 2.7 (L) 06/26/2018   BILITOT 0.8 06/26/2018   ALKPHOS 56 06/26/2018   AST 24 06/26/2018   ALT 14 06/26/2018  .   Total Time in preparing paper work, data evaluation and todays exam - 32 minutes  Phillips Climes M.D on 06/27/2018 at [facility]  [phone]

## 2018-06-27 NOTE — Progress Notes (Signed)
Pt has orders to discharge to home. MD came by and spoke with me about pts discharge. I let him know about the pts missing belongings. He stated that the pt is medically stable and there is no reason for him to remain in the hospital.Pt still does not have belongings and states that he is "not leaving here without my pants and my cards". Notified the charge nurse.

## 2018-06-27 NOTE — Progress Notes (Signed)
Pt wants to go home. Cloths and wallet , ID were on 6th floor. Maybe locked up in drawer. RN to call nursing supervisor to find person who has keys to locked drawers. He has no cloths , ID , $ , to get Rx.

## 2018-06-27 NOTE — Anesthesia Postprocedure Evaluation (Addendum)
Anesthesia Post Note  Patient: Cedarius Kersh  Procedure(s) Performed: RIGHT FOOT 1ST RAY AMPUTATION (Right )     Patient location during evaluation: PACU Anesthesia Type: General Level of consciousness: awake and alert Pain management: pain level controlled Vital Signs Assessment: post-procedure vital signs reviewed and stable Respiratory status: spontaneous breathing, nonlabored ventilation, respiratory function stable and patient connected to nasal cannula oxygen Cardiovascular status: blood pressure returned to baseline and stable Postop Assessment: no apparent nausea or vomiting Anesthetic complications: no    Last Vitals:  Vitals:   06/27/18 0012 06/27/18 0503  BP: 128/82 126/68  Pulse: 72 75  Resp:    Temp: 37 C 36.4 C  SpO2: 95% 98%    Last Pain:  Vitals:   06/27/18 0955  TempSrc:   PainSc: 7                  Dhanush Jokerst L Dionisio Aragones

## 2018-06-27 NOTE — Progress Notes (Signed)
LAC with mild swelling and petechiae noted.  Pt states swelling is much better.  Encouraged to alternate heat and ice to relieve symptoms.

## 2018-06-27 NOTE — Care Management (Signed)
Pt refuses HH PT/OT.  He states he doesn't want anyone coming into home.  Pt agrees to RW.  RW will be delivered to room prior to dc.

## 2018-06-27 NOTE — Discharge Instructions (Signed)
Follow with Primary MD  in 7 days   Get CBC, CMP,  checked  by Primary MD next visit.    Activity: As tolerated with Full fall precautions use walker/cane & assistance as needed   Disposition Home    Diet: Carb modified diet , with feeding assistance and aspiration precautions.  For Heart failure patients - Check your Weight same time everyday, if you gain over 2 pounds, or you develop in leg swelling, experience more shortness of breath or chest pain, call your Primary MD immediately. Follow Cardiac Low Salt Diet and 1.5 lit/day fluid restriction.   On your next visit with your primary care physician please Get Medicines reviewed and adjusted.   Please request your Prim.MD to go over all Hospital Tests and Procedure/Radiological results at the follow up, please get all Hospital records sent to your Prim MD by signing hospital release before you go home.   If you experience worsening of your admission symptoms, develop shortness of breath, life threatening emergency, suicidal or homicidal thoughts you must seek medical attention immediately by calling 911 or calling your MD immediately  if symptoms less severe.  You Must read complete instructions/literature along with all the possible adverse reactions/side effects for all the Medicines you take and that have been prescribed to you. Take any new Medicines after you have completely understood and accpet all the possible adverse reactions/side effects.   Do not drive, operating heavy machinery, perform activities at heights, swimming or participation in water activities or provide baby sitting services if your were admitted for syncope or siezures until you have seen by Primary MD or a Neurologist and advised to do so again.  Do not drive when taking Pain medications.    Do not take more than prescribed Pain, Sleep and Anxiety Medications  Special Instructions: If you have smoked or chewed Tobacco  in the last 2 yrs please stop  smoking, stop any regular Alcohol  and or any Recreational drug use.  Wear Seat belts while driving.   Please note  You were cared for by a hospitalist during your hospital stay. If you have any questions about your discharge medications or the care you received while you were in the hospital after you are discharged, you can call the unit and asked to speak with the hospitalist on call if the hospitalist that took care of you is not available. Once you are discharged, your primary care physician will handle any further medical issues. Please note that NO REFILLS for any discharge medications will be authorized once you are discharged, as it is imperative that you return to your primary care physician (or establish a relationship with a primary care physician if you do not have one) for your aftercare needs so that they can reassess your need for medications and monitor your lab values.

## 2018-06-28 ENCOUNTER — Encounter (HOSPITAL_COMMUNITY): Payer: Self-pay | Admitting: Orthopedic Surgery

## 2018-06-29 ENCOUNTER — Encounter (HOSPITAL_COMMUNITY): Payer: Self-pay | Admitting: Orthopedic Surgery

## 2018-06-29 NOTE — Addendum Note (Signed)
Addendum  created 06/29/18 1202 by Freddrick March, MD   Sign clinical note, SmartForm saved

## 2018-07-05 ENCOUNTER — Inpatient Hospital Stay (INDEPENDENT_AMBULATORY_CARE_PROVIDER_SITE_OTHER): Payer: 59 | Admitting: Physician Assistant

## 2018-07-05 ENCOUNTER — Encounter (INDEPENDENT_AMBULATORY_CARE_PROVIDER_SITE_OTHER): Payer: Self-pay | Admitting: Physician Assistant

## 2018-07-05 ENCOUNTER — Ambulatory Visit (INDEPENDENT_AMBULATORY_CARE_PROVIDER_SITE_OTHER): Payer: 59 | Admitting: Physician Assistant

## 2018-07-05 VITALS — Ht 68.0 in | Wt 176.4 lb

## 2018-07-05 DIAGNOSIS — S98111A Complete traumatic amputation of right great toe, initial encounter: Secondary | ICD-10-CM

## 2018-07-05 HISTORY — DX: Complete traumatic amputation of right great toe, initial encounter: S98.111A

## 2018-07-05 NOTE — Progress Notes (Signed)
Office Visit Note   Patient: [name]           Date of Birth: 1956-05-17           MRN: [id number] Visit Date: 07/05/2018              Requested by: No referring provider defined for this encounter. PCP: Patient, No Pcp Per  Chief Complaint  Patient presents with  . Right Foot - Routine Post Op    06/25/18 Right Foot 1st Ray      HPI: Patient is a 62 year old gentleman who presents status post right foot first ray amputation approximately 10 days ago.  He states the wound VAC stopped working this weekend and this was removed.  Patient states when trying to get to his car he tripped over the curb and struck his right foot.  Assessment & Plan: Visit Diagnoses:  1. Amputated great toe, right ([place])     Plan: We will have patient start daily dressing changes elevation and compression.  Discussed the importance of elevation.  We will give him a postoperative shoe recommended trying a kneeling scooter discussed that he needs good balance to use 1.  Follow-Up Instructions: Return in about 1 week (around 07/12/2018).   Ortho Exam  Patient is alert, oriented, no adenopathy, well-dressed, normal affect, normal respiratory effort. Examination patient has swelling of the right lower extremity he has good pulses.  The wound has some mild ischemic changes distally but there is no wound dehiscence no cellulitis no odor no drainage.  Imaging: No results found. No images are attached to the encounter.  Labs: Lab Results  Component Value Date   HGBA1C 7.8 (H) 06/20/2018   HGBA1C 8.5 (H) 07/08/2012   ESRSEDRATE 57 (H) 06/19/2018   CRP 10.5 (H) 06/19/2018   REPTSTATUS 06/24/2018 FINAL 06/19/2018   CULT  06/19/2018    NO GROWTH 5 DAYS Performed at [facility], [place] [address], [place], [place] [address]      Lab Results  Component Value Date   ALBUMIN 2.7 (L) 06/26/2018   ALBUMIN 2.6 (L) 06/25/2018   ALBUMIN 3.7 06/19/2018    Body mass index is 26.82  kg/m.  Orders:  No orders of the defined types were placed in this encounter.  No orders of the defined types were placed in this encounter.    Procedures: No procedures performed  Clinical Data: No additional findings.  ROS:  All other systems negative, except as noted in the HPI. Review of Systems  Objective: Vital Signs: Ht 5\' 8"  (1.727 m)   Wt 176 lb 5.9 oz (80 kg)   BMI 26.82 kg/m   Specialty Comments:  No specialty comments available.  PMFS History: Patient Active Problem List   Diagnosis Date Noted  . Amputated great toe, right ([place]) 07/05/2018  . Status post surgery 06/25/2018  . Diabetes mellitus with peripheral vascular disease ([place]) 06/19/2018  . Sepsis ([place]) 06/19/2018  . Back pain 06/19/2018  . Cellulitis of right lower extremity 06/19/2018  . Former smoker 06/19/2018  . Gangrene of toe ([place])   . Hyperglycemia   . Dehydration 07/07/2012  . Syncope 07/07/2012  . Hypotension 07/07/2012  . Leukocytosis 07/07/2012  . Hyperkalemia 07/07/2012   Past Medical History:  Diagnosis Date  . Arthritis   . Diabetes mellitus without complication ([place])   . Enlarged prostate   . Herniated lumbar intervertebral disc   . Neck pain     Family History  Problem Relation Age  of Onset  . Heart disease Mother   . Asthma Mother   . Heart disease Father     Past Surgical History:  Procedure Laterality Date  . ABDOMINAL SURGERY    . AMPUTATION Right 06/25/2018   Procedure: RIGHT FOOT 1ST RAY AMPUTATION;  Surgeon: [name], MD;  Location: [facility];  Service: Orthopedics;  Laterality: Right;  . LOWER EXTREMITY ANGIOGRAPHY N/A 06/21/2018   Procedure: LOWER EXTREMITY ANGIOGRAPHY;  Surgeon: [name], MD;  Location: [facility];  Service: Cardiovascular;  Laterality: N/A;  . open chest    . PERIPHERAL VASCULAR ATHERECTOMY Right 06/21/2018   Procedure: PERIPHERAL VASCULAR ATHERECTOMY w/ DCB;  Surgeon: [name], MD;  Location:  [facility];  Service: Cardiovascular;  Laterality: Right;  Popliteal   Social History   Occupational History  . Not on file  Tobacco Use  . Smoking status: Former Smoker    Packs/day: 0.50    Years: 40.00    Pack years: 20.00    Types: Cigarettes  . Smokeless tobacco: Never Used  . Tobacco comment: quit on 06/14/2018  Substance and Sexual Activity  . Alcohol use: No  . Drug use: Yes    Types: Marijuana  . Sexual activity: Not Currently

## 2018-07-20 ENCOUNTER — Ambulatory Visit (INDEPENDENT_AMBULATORY_CARE_PROVIDER_SITE_OTHER): Payer: 59 | Admitting: Physician Assistant

## 2018-07-20 ENCOUNTER — Encounter (INDEPENDENT_AMBULATORY_CARE_PROVIDER_SITE_OTHER): Payer: Self-pay | Admitting: Physician Assistant

## 2018-07-20 VITALS — Ht 68.0 in | Wt 176.4 lb

## 2018-07-20 DIAGNOSIS — Z89411 Acquired absence of right great toe: Secondary | ICD-10-CM

## 2018-07-20 DIAGNOSIS — E1151 Type 2 diabetes mellitus with diabetic peripheral angiopathy without gangrene: Secondary | ICD-10-CM

## 2018-07-20 MED ORDER — DOXYCYCLINE HYCLATE 100 MG PO CAPS: 100.0000 mg | ORAL_CAPSULE | Freq: Two times a day (BID) | ORAL | 1 refills | Status: DC

## 2018-07-20 NOTE — Progress Notes (Signed)
Office Visit Note   Patient: [name]           Date of Birth: [birth year]           MRN: [id number] Visit Date: 07/20/2018              Requested by: No referring provider defined for this encounter. PCP: Patient, No Pcp Per  Chief Complaint  Patient presents with  . Right Foot - Routine Post Op      HPI: The patient is a 62 year old gentleman who is seen for postoperative follow-up following a right first ray amputation on 06/25/2018 following revascularization procedures to the right lower extremity by Dr. Donzetta Matters on 06/21/2018 which included atherectomy of the right popliteal artery and balloon angioplasty of the right popliteal artery and posterior tibial artery on the right.. He reports no pain over the area.  He reports some mild drainage.  He reports trying to offload the area as much as possible.  He is reportedly diabetic but not currently on any insulin or oral hypoglycemic agents.  He has seen a primary care provider but would like a PCP closer to his home near the [place] area.  Assessment & Plan: Visit Diagnoses:  1. 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 Diabetes mellitus with peripheral vascular disease ([place])     Plan: Will leave sutures in place until next week.  He should continue to offload the area and avoid weightbearing as much as possible.  Elevate as much as possible higher than the level of the heart.  He is also given information on weight protein supplementation, probiotics and multivitamins.  Were also going to start doxycycline 100 mg p.o. twice daily for the next couple of weeks.  He will follow-up here next week or sooner should he have difficulties in the interim.  Follow-Up Instructions: Return in about 9 days (around 07/29/2018).   Ortho Exam  Patient is alert, oriented, no adenopathy, well-dressed, normal affect, normal respiratory effort. The right great toe amputation site sutures are intact.  It is under some  tension.  There is some minimal serosanguineous appearing drainage and maceration of the distal incision line.  There is also some localized erythema and erythema about the second and third toes as well with some dried dorsal ulcerations which the patient states have been there and are unchanged.  He has palpable pedal pulses.  Imaging: No results found. No images are attached to the encounter.  Labs: Lab Results  Component Value Date   HGBA1C 7.8 (H) 06/20/2018   HGBA1C 8.5 (H) 07/08/2012   ESRSEDRATE 57 (H) 06/19/2018   CRP 10.5 (H) 06/19/2018   REPTSTATUS 06/24/2018 FINAL 06/19/2018   CULT  06/19/2018    NO GROWTH 5 DAYS Performed at [facility], [place] [address] [place], [place] [address]      Lab Results  Component Value Date   ALBUMIN 2.7 (L) 06/26/2018   ALBUMIN 2.6 (L) 06/25/2018   ALBUMIN 3.7 06/19/2018    Body mass index is 26.82 kg/m.  Orders:  No orders of the defined types were placed in this encounter.  Meds ordered this encounter  Medications  . doxycycline (VIBRAMYCIN) 100 MG capsule    Sig: Take 1 capsule (100 mg total) by mouth 2 (two) times daily.    Dispense:  28 capsule    Refill:  1     Procedures: No procedures performed  Clinical Data: No additional findings.  ROS:  All other systems negative, except as noted in the HPI. Review of Systems  Objective: Vital Signs: Ht 5\' 8"  (1.727 m)   Wt 176 lb 5.9 oz (80 kg)   BMI 26.82 kg/m   Specialty Comments:  No specialty comments available.  PMFS History: Patient Active Problem List   Diagnosis Date Noted  . Amputated great toe, right ([place]) 07/05/2018  . Status post surgery 06/25/2018  . Diabetes mellitus with peripheral vascular disease ([place]) 06/19/2018  . Sepsis ([place]) 06/19/2018  . Back pain 06/19/2018  . Cellulitis of right lower extremity 06/19/2018  . Former smoker 06/19/2018  . Gangrene of toe ([place])   . Hyperglycemia   . Dehydration 07/07/2012  . Syncope 07/07/2012    . Hypotension 07/07/2012  . Leukocytosis 07/07/2012  . Hyperkalemia 07/07/2012   Past Medical History:  Diagnosis Date  . Arthritis   . Diabetes mellitus without complication ([place])   . Enlarged prostate   . Herniated lumbar intervertebral disc   . Neck pain     Family History  Problem Relation Age of Onset  . Heart disease Mother   . Asthma Mother   . Heart disease Father     Past Surgical History:  Procedure Laterality Date  . ABDOMINAL SURGERY    . AMPUTATION Right 06/25/2018   Procedure: RIGHT FOOT 1ST RAY AMPUTATION;  Surgeon: [name], MD;  Location: [facility];  Service: Orthopedics;  Laterality: Right;  . LOWER EXTREMITY ANGIOGRAPHY N/A 06/21/2018   Procedure: LOWER EXTREMITY ANGIOGRAPHY;  Surgeon: [name], MD;  Location: [facility];  Service: Cardiovascular;  Laterality: N/A;  . open chest    . PERIPHERAL VASCULAR ATHERECTOMY Right 06/21/2018   Procedure: PERIPHERAL VASCULAR ATHERECTOMY w/ DCB;  Surgeon: [name], MD;  Location: [facility];  Service: Cardiovascular;  Laterality: Right;  Popliteal   Social History   Occupational History  . Not on file  Tobacco Use  . Smoking status: Former Smoker    Packs/day: 0.50    Years: 40.00    Pack years: 20.00    Types: Cigarettes  . Smokeless tobacco: Never Used  . Tobacco comment: quit on 06/14/2018  Substance and Sexual Activity  . Alcohol use: No  . Drug use: Yes    Types: Marijuana  . Sexual activity: Not Currently

## 2018-07-22 ENCOUNTER — Other Ambulatory Visit: Payer: Self-pay

## 2018-07-22 DIAGNOSIS — E1151 Type 2 diabetes mellitus with diabetic peripheral angiopathy without gangrene: Secondary | ICD-10-CM

## 2018-07-30 ENCOUNTER — Encounter (INDEPENDENT_AMBULATORY_CARE_PROVIDER_SITE_OTHER): Payer: Self-pay | Admitting: Physician Assistant

## 2018-07-30 ENCOUNTER — Ambulatory Visit (INDEPENDENT_AMBULATORY_CARE_PROVIDER_SITE_OTHER): Payer: 59 | Admitting: Physician Assistant

## 2018-07-30 DIAGNOSIS — E1151 Type 2 diabetes mellitus with diabetic peripheral angiopathy without gangrene: Secondary | ICD-10-CM

## 2018-07-30 DIAGNOSIS — Z89411 Acquired absence of right great toe: Secondary | ICD-10-CM

## 2018-07-30 MED ORDER — NITROGLYCERIN 0.4 MG/HR TD PT24: 0.4000 mg | MEDICATED_PATCH | Freq: Every day | TRANSDERMAL | 12 refills | Status: DC

## 2018-07-30 NOTE — Progress Notes (Signed)
Office Visit Note   Patient: [name]           Date of Birth: [birth year]           MRN: [id number] Visit Date: 07/30/2018              Requested by: No referring provider defined for this encounter. PCP: Patient, No Pcp Per  No chief complaint on file.     HPI: The patient is a 62 year old gentleman who is seen for postoperative follow-up following a right first ray amputation on 06/25/2018 following revascularization procedures to the right lower extremity by Dr. Donzetta Matters on 06/21/2018 which included atherectomy of the right popliteal artery and balloon angioplasty of the right popliteal artery and posterior tibial artery on the right.. He has been on Doxycycline for the past week. He reports some continued drainage from the proximal incision line which he felt was pus. There is widening of the distal incision and minimal serosanguinous drainage. He reports he is trying to stay off the area using walker to walk. He reports no pain over the area.   Assessment & Plan: Visit Diagnoses:  1. History of complete ray amputation of right great toe ([place])   2. Diabetes mellitus with peripheral vascular disease ([facility])     Plan: Continue Doxycycline 100 mg BID , he is on Plavix following his revascularization procedure. We are going to start some NTG patches to the foot. Continue to off load and elevate the leg as possible. Follow up in 1 week.   Follow-Up Instructions: Return in about 1 week (around 08/06/2018).   Ortho Exam  Patient is alert, oriented, no adenopathy, well-dressed, normal affect, normal respiratory effort. The distal incision line is dehisced and macerated. The proximal incision appears to be healing and a portion of the sutures were removed. Palpable dorsalis pedis pulse. Some erythema of the remaining toes and dry dark ulcer over the right 2nd toe without drainage.   Imaging: No results found. No images are attached to the encounter.  Labs: Lab Results  Component  Value Date   HGBA1C 7.8 (H) 06/20/2018   HGBA1C 8.5 (H) 07/08/2012   ESRSEDRATE 57 (H) 06/19/2018   CRP 10.5 (H) 06/19/2018   REPTSTATUS 06/24/2018 FINAL 06/19/2018   CULT  06/19/2018    NO GROWTH 5 DAYS Performed at [facility], [place] [address] [place], [place] [address]      Lab Results  Component Value Date   ALBUMIN 2.7 (L) 06/26/2018   ALBUMIN 2.6 (L) 06/25/2018   ALBUMIN 3.7 06/19/2018    There is no height or weight on file to calculate BMI.  Orders:  No orders of the defined types were placed in this encounter.  Meds ordered this encounter  Medications  . nitroGLYCERIN (NITRODUR - DOSED IN MG/24 HR) 0.4 mg/hr patch    Sig: Place 1 patch (0.4 mg total) onto the skin daily.    Dispense:  30 patch    Refill:  12    Apply to foot daily as instructed     Procedures: No procedures performed  Clinical Data: No additional findings.  ROS:  All other systems negative, except as noted in the HPI. Review of Systems  Objective: Vital Signs: There were no vitals taken for this visit.  Specialty Comments:  No specialty comments available.  PMFS History: Patient Active Problem List   Diagnosis Date Noted  . Amputated great toe, right ([place]) 07/05/2018  . Status post surgery 06/25/2018  .  Diabetes mellitus with peripheral vascular disease ([place]) 06/19/2018  . Sepsis ([place]) 06/19/2018  . Back pain 06/19/2018  . Cellulitis of right lower extremity 06/19/2018  . Former smoker 06/19/2018  . Gangrene of toe ([place])   . Hyperglycemia   . Dehydration 07/07/2012  . Syncope 07/07/2012  . Hypotension 07/07/2012  . Leukocytosis 07/07/2012  . Hyperkalemia 07/07/2012   Past Medical History:  Diagnosis Date  . Arthritis   . Diabetes mellitus without complication ([place])   . Enlarged prostate   . Herniated lumbar intervertebral disc   . Neck pain     Family History  Problem Relation Age of Onset  . Heart disease Mother   . Asthma Mother   . Heart disease  Father     Past Surgical History:  Procedure Laterality Date  . ABDOMINAL SURGERY    . AMPUTATION Right 06/25/2018   Procedure: RIGHT FOOT 1ST RAY AMPUTATION;  Surgeon: [name], MD;  Location: [facility];  Service: Orthopedics;  Laterality: Right;  . LOWER EXTREMITY ANGIOGRAPHY N/A 06/21/2018   Procedure: LOWER EXTREMITY ANGIOGRAPHY;  Surgeon: [name], MD;  Location: [facility];  Service: Cardiovascular;  Laterality: N/A;  . open chest    . PERIPHERAL VASCULAR ATHERECTOMY Right 06/21/2018   Procedure: PERIPHERAL VASCULAR ATHERECTOMY w/ DCB;  Surgeon: [name], MD;  Location: [facility];  Service: Cardiovascular;  Laterality: Right;  Popliteal   Social History   Occupational History  . Not on file  Tobacco Use  . Smoking status: Former Smoker    Packs/day: 0.50    Years: 40.00    Pack years: 20.00    Types: Cigarettes  . Smokeless tobacco: Never Used  . Tobacco comment: quit on 06/14/2018  Substance and Sexual Activity  . Alcohol use: No  . Drug use: Yes    Types: Marijuana  . Sexual activity: Not Currently

## 2018-08-06 ENCOUNTER — Encounter (INDEPENDENT_AMBULATORY_CARE_PROVIDER_SITE_OTHER): Payer: Self-pay | Admitting: Physician Assistant

## 2018-08-06 ENCOUNTER — Ambulatory Visit (INDEPENDENT_AMBULATORY_CARE_PROVIDER_SITE_OTHER): Payer: 59 | Admitting: Physician Assistant

## 2018-08-06 VITALS — Ht 68.0 in | Wt 176.4 lb

## 2018-08-06 DIAGNOSIS — Z89411 Acquired absence of right great toe: Secondary | ICD-10-CM

## 2018-08-06 DIAGNOSIS — E1151 Type 2 diabetes mellitus with diabetic peripheral angiopathy without gangrene: Secondary | ICD-10-CM

## 2018-08-06 MED ORDER — DOXYCYCLINE HYCLATE 100 MG PO CAPS: 100.0000 mg | ORAL_CAPSULE | Freq: Two times a day (BID) | ORAL | 1 refills | Status: DC

## 2018-08-06 MED ORDER — PENTOXIFYLLINE ER 400 MG PO TBCR: 400.0000 mg | EXTENDED_RELEASE_TABLET | Freq: Three times a day (TID) | ORAL | 1 refills | Status: DC

## 2018-08-06 NOTE — Progress Notes (Signed)
Office Visit Note   Patient: [name]           Date of Birth: 05/18/1956           MRN: [id number] Visit Date: 08/06/2018              Requested by: No referring provider defined for this encounter. PCP: Patient, No Pcp Per  Chief Complaint  Patient presents with  . Right Foot - Routine Post Op      HPI: The patient is a 63 year old gentleman who is seen for postoperative follow-up following a right first ray amputation on 06/25/2018 following revascularization procedures to the right lower extremity by Dr. Donzetta Matters on 06/21/2018 which included arthrectomy of the right popliteal artery and balloon angioplasty of the right popliteal artery and posterior tibial artery on the right. His right first ray amputation site has been slow to heal and he is also developed some dark necrotic areas of the second dorsal toe and the inter-webspace between the fourth and fifth toe.  He is washing the foot daily and then applying dry dressings.  He has been started on a nitroglycerin patch to the right foot to assist with flow and will also be started on Trental today.  Today is the last day of his Plavix following his revascularization procedures.  He also continues on doxycycline 100 mg p.o. twice daily.  He has been taking a multivitamin daily and probiotics as well.  Assessment & Plan: Visit Diagnoses:  1. 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 Diabetes mellitus with peripheral vascular disease (HCC)     Plan: Continue doxycycline 100 mg p.o. twice daily.  Continue nitroglycerin patches alternating the sites to the right foot and were also going to start Trental 400 mg p.o. 3 times daily.  Continue multivitamin probiotics.  Also recommend the patient start a whey protein supplement with low carb content and they are to try to get something for protein supplementation.  We discussed that we remain very concerned about the healing thus far of the right foot and he will need  continued close follow-up.  He will follow-up next week.  Follow-Up Instructions: Return in about 1 week (around 08/13/2018).   Ortho Exam  Patient is alert, oriented, no adenopathy, well-dressed, normal affect, normal respiratory effort. The right first ray amputation site sutures are intact.  The area is less macerated but still edematous and erythematous throughout the mid and forefoot.  There are ulcers over the second and third toe as well as in the webspace between the fourth and fifth toes which are unstageable dark dry necrotic areas.  The foot is warm and he has palpable pedal pulse in the dorsalis pedis and posterior tibialis vessels.  Imaging: No results found.   Labs: Lab Results  Component Value Date   HGBA1C 7.8 (H) 06/20/2018   HGBA1C 8.5 (H) 07/08/2012   ESRSEDRATE 57 (H) 06/19/2018   CRP 10.5 (H) 06/19/2018   REPTSTATUS 06/24/2018 FINAL 06/19/2018   CULT  06/19/2018    NO GROWTH 5 DAYS Performed at [facility], [place] [address], [place], [place] [address]      Lab Results  Component Value Date   ALBUMIN 2.7 (L) 06/26/2018   ALBUMIN 2.6 (L) 06/25/2018   ALBUMIN 3.7 06/19/2018    Body mass index is 26.82 kg/m.  Orders:  No orders of the defined types were placed in this encounter.  Meds ordered this encounter  Medications  .  doxycycline (VIBRAMYCIN) 100 MG capsule    Sig: Take 1 capsule (100 mg total) by mouth 2 (two) times daily.    Dispense:  28 capsule    Refill:  1  . pentoxifylline (TRENTAL) 400 MG CR tablet    Sig: Take 1 tablet (400 mg total) by mouth 3 (three) times daily with meals.    Dispense:  90 tablet    Refill:  1     Procedures: No procedures performed  Clinical Data: No additional findings.  ROS:  All other systems negative, except as noted in the HPI. Review of Systems  Objective: Vital Signs: Ht 5\' 8"  (1.727 m)   Wt 176 lb 5.9 oz (80 kg)   BMI 26.82 kg/m   Specialty Comments:  No specialty comments  available.  PMFS History: Patient Active Problem List   Diagnosis Date Noted  . Amputated great toe, right ([place]) 07/05/2018  . Status post surgery 06/25/2018  . Diabetes mellitus with peripheral vascular disease ([place]) 06/19/2018  . Sepsis ([place]) 06/19/2018  . Back pain 06/19/2018  . Cellulitis of right lower extremity 06/19/2018  . Former smoker 06/19/2018  . Gangrene of toe ([place])   . Hyperglycemia   . Dehydration 07/07/2012  . Syncope 07/07/2012  . Hypotension 07/07/2012  . Leukocytosis 07/07/2012  . Hyperkalemia 07/07/2012   Past Medical History:  Diagnosis Date  . Arthritis   . Diabetes mellitus without complication ([place])   . Enlarged prostate   . Herniated lumbar intervertebral disc   . Neck pain     Family History  Problem Relation Age of Onset  . Heart disease Mother   . Asthma Mother   . Heart disease Father     Past Surgical History:  Procedure Laterality Date  . ABDOMINAL SURGERY    . AMPUTATION Right 06/25/2018   Procedure: RIGHT FOOT 1ST RAY AMPUTATION;  Surgeon: [name], MD;  Location: [facility];  Service: Orthopedics;  Laterality: Right;  . LOWER EXTREMITY ANGIOGRAPHY N/A 06/21/2018   Procedure: LOWER EXTREMITY ANGIOGRAPHY;  Surgeon: [name], MD;  Location: [facility];  Service: Cardiovascular;  Laterality: N/A;  . open chest    . PERIPHERAL VASCULAR ATHERECTOMY Right 06/21/2018   Procedure: PERIPHERAL VASCULAR ATHERECTOMY w/ DCB;  Surgeon: [name], MD;  Location: [facility];  Service: Cardiovascular;  Laterality: Right;  Popliteal   Social History   Occupational History  . Not on file  Tobacco Use  . Smoking status: Former Smoker    Packs/day: 0.50    Years: 40.00    Pack years: 20.00    Types: Cigarettes  . Smokeless tobacco: Never Used  . Tobacco comment: quit on 06/14/2018  Substance and Sexual Activity  . Alcohol use: No  . Drug use: Yes    Types: Marijuana  . Sexual activity: Not  Currently

## 2018-08-12 ENCOUNTER — Ambulatory Visit (INDEPENDENT_AMBULATORY_CARE_PROVIDER_SITE_OTHER): Payer: 59 | Admitting: Physician Assistant

## 2018-08-12 ENCOUNTER — Encounter (INDEPENDENT_AMBULATORY_CARE_PROVIDER_SITE_OTHER): Payer: Self-pay | Admitting: Orthopedic Surgery

## 2018-08-12 VITALS — Ht 68.0 in | Wt 176.0 lb

## 2018-08-12 DIAGNOSIS — E1151 Type 2 diabetes mellitus with diabetic peripheral angiopathy without gangrene: Secondary | ICD-10-CM

## 2018-08-12 DIAGNOSIS — Z89411 Acquired absence of right great toe: Secondary | ICD-10-CM

## 2018-08-12 DIAGNOSIS — E43 Unspecified severe protein-calorie malnutrition: Secondary | ICD-10-CM

## 2018-08-13 ENCOUNTER — Encounter (HOSPITAL_COMMUNITY): Payer: 59

## 2018-08-13 ENCOUNTER — Ambulatory Visit: Payer: 59 | Admitting: Vascular Surgery

## 2018-08-13 ENCOUNTER — Encounter (INDEPENDENT_AMBULATORY_CARE_PROVIDER_SITE_OTHER): Payer: Self-pay | Admitting: Physician Assistant

## 2018-08-13 NOTE — Progress Notes (Signed)
Office Visit Note   Patient: [name]           Date of Birth: 12/14/1955           MRN: [id number] Visit Date: 08/12/2018              Requested by: No referring provider defined for this encounter. PCP: Patient, No Pcp Per  Chief Complaint  Patient presents with  . Right Foot - Routine Post Op    06/25/18 right foot 1st ray amputation       HPI: The patient is a 63 yo gentleman who is seen for postoperative follow-up following a right first ray amputation on 06/25/2018.  He also underwent revascularization procedures to the right lower extremity by Dr. Donzetta Matters on 06/21/2018.  He is nonweightbearing over the right lower extremity is much as possible.  He has been utilizing a dry dressing to the foot daily.  He is on doxycycline 100 mg p.o. twice daily as well as nitroglycerin patches to the foot and Trental 400 mg p.o. 3 times daily.  He is also taking a multivitamin, whey protein supplements and probiotics.  He does report some continued yellow drainage swelling and scabbing over the area. He reports he is going to have follow-up blood flow studies to the right lower extremity with Dr. Donzetta Matters soon as well. Assessment & Plan: Visit Diagnoses:  1. 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 Diabetes mellitus with peripheral vascular disease ([place])   3. Severe protein-calorie malnutrition (HCC)     Plan: Continue doxycycline 100 mg p.o. twice daily.  Continue to offload the foot as much as possible.  Continue nitroglycerin patches to the foot and rotate sites as well as Trental 400 mg p.o. 3 times daily.  We will begin some Iodosorb ointment to the open areas over the incisional area.  Follow-Up Instructions: Return in about 1 week (around 08/19/2018).   Ortho Exam  Patient is alert, oriented, no adenopathy, well-dressed, normal affect, normal respiratory effort. Right foot incisional site continues to have a good deal of crusting over the incisional area.  There  is some widening of the incision and separation with small open areas which are draining yellow to slightly bloody drainage.  There is some mild odor.  The proximal sutures were removed.  We applied Iodosorb to the open areas.  Right lower extremity has palpable pedal pulses and the foot is warm.  There is some mild peri-incisional erythema and edema.  Imaging: No results found. No images are attached to the encounter.  Labs: Lab Results  Component Value Date   HGBA1C 7.8 (H) 06/20/2018   HGBA1C 8.5 (H) 07/08/2012   ESRSEDRATE 57 (H) 06/19/2018   CRP 10.5 (H) 06/19/2018   REPTSTATUS 06/24/2018 FINAL 06/19/2018   CULT  06/19/2018    NO GROWTH 5 DAYS Performed at [facility], [place] [address], [place], [place] [address]      Lab Results  Component Value Date   ALBUMIN 2.7 (L) 06/26/2018   ALBUMIN 2.6 (L) 06/25/2018   ALBUMIN 3.7 06/19/2018    Body mass index is 26.76 kg/m.  Orders:  No orders of the defined types were placed in this encounter.  No orders of the defined types were placed in this encounter.    Procedures: No procedures performed  Clinical Data: No additional findings.  ROS:  All other systems negative, except as noted in the HPI. Review of Systems  Objective:  Vital Signs: Ht 5\' 8"  (1.727 m)   Wt 176 lb (79.8 kg)   BMI 26.76 kg/m   Specialty Comments:  No specialty comments available.  PMFS History: Patient Active Problem List   Diagnosis Date Noted  . Amputated great toe, right ([place]) 07/05/2018  . Status post surgery 06/25/2018  . Diabetes mellitus with peripheral vascular disease ([place]) 06/19/2018  . Sepsis ([place]) 06/19/2018  . Back pain 06/19/2018  . Cellulitis of right lower extremity 06/19/2018  . Former smoker 06/19/2018  . Gangrene of toe ([place])   . Hyperglycemia   . Dehydration 07/07/2012  . Syncope 07/07/2012  . Hypotension 07/07/2012  . Leukocytosis 07/07/2012  . Hyperkalemia 07/07/2012   Past Medical History:    Diagnosis Date  . Arthritis   . Diabetes mellitus without complication ([place])   . Enlarged prostate   . Herniated lumbar intervertebral disc   . Neck pain     Family History  Problem Relation Age of Onset  . Heart disease Mother   . Asthma Mother   . Heart disease Father     Past Surgical History:  Procedure Laterality Date  . ABDOMINAL SURGERY    . AMPUTATION Right 06/25/2018   Procedure: RIGHT FOOT 1ST RAY AMPUTATION;  Surgeon: [name], MD;  Location: [facility];  Service: Orthopedics;  Laterality: Right;  . LOWER EXTREMITY ANGIOGRAPHY N/A 06/21/2018   Procedure: LOWER EXTREMITY ANGIOGRAPHY;  Surgeon: [name], MD;  Location: [facility];  Service: Cardiovascular;  Laterality: N/A;  . open chest    . PERIPHERAL VASCULAR ATHERECTOMY Right 06/21/2018   Procedure: PERIPHERAL VASCULAR ATHERECTOMY w/ DCB;  Surgeon: [name], MD;  Location: [facility];  Service: Cardiovascular;  Laterality: Right;  Popliteal   Social History   Occupational History  . Not on file  Tobacco Use  . Smoking status: Former Smoker    Packs/day: 0.50    Years: 40.00    Pack years: 20.00    Types: Cigarettes  . Smokeless tobacco: Never Used  . Tobacco comment: quit on 06/14/2018  Substance and Sexual Activity  . Alcohol use: No  . Drug use: Yes    Types: Marijuana  . Sexual activity: Not Currently

## 2018-08-19 ENCOUNTER — Ambulatory Visit (INDEPENDENT_AMBULATORY_CARE_PROVIDER_SITE_OTHER): Payer: 59 | Admitting: Physician Assistant

## 2018-08-20 ENCOUNTER — Ambulatory Visit (INDEPENDENT_AMBULATORY_CARE_PROVIDER_SITE_OTHER): Payer: 59 | Admitting: Physician Assistant

## 2018-08-20 ENCOUNTER — Encounter (INDEPENDENT_AMBULATORY_CARE_PROVIDER_SITE_OTHER): Payer: Self-pay | Admitting: Family Medicine

## 2018-08-20 ENCOUNTER — Ambulatory Visit (INDEPENDENT_AMBULATORY_CARE_PROVIDER_SITE_OTHER): Payer: 59 | Admitting: Family Medicine

## 2018-08-20 VITALS — Ht 68.0 in | Wt 176.0 lb

## 2018-08-20 DIAGNOSIS — Z89411 Acquired absence of right great toe: Secondary | ICD-10-CM

## 2018-08-20 NOTE — Progress Notes (Signed)
Office Visit Note   Patient: [name]           Date of Birth: 1955-10-04           MRN: [id number] Visit Date: 08/20/2018 Requested by: No referring provider defined for this encounter. PCP: Patient, No Pcp Per  Subjective: Chief Complaint  Patient presents with  . Right Foot - Routine Post Op    Right foot Great Toe amp    HPI: He is here for follow-up almost 2 months status post right first ray amputation.  Using Iodosorb and doxycycline, occasional nitroglycerin patch but it seems to bother his toes when he uses it too long.               ROS: Diabetes has been fairly well controlled recently per patient report.  Objective: Vital Signs: Ht 5\' 8"  (1.727 m)   Wt 176 lb (79.8 kg)   BMI 26.76 kg/m   Physical Exam:  Right foot: Wound is closed, no drainage.  The distal sutures were left in place.  Slight erythema, no foul odor.  Imaging: None today  Assessment & Plan: 1.  Slowly healing 2 months status post right great toe amputation -Continue dressing changes and doxycycline.  Follow-up in 1 week with Dr. Sharol Given.   Follow-Up Instructions: No follow-ups on file.      Procedures: No procedures performed  No notes on file    PMFS History: Patient Active Problem List   Diagnosis Date Noted  . Amputated great toe, right ([place]) 07/05/2018  . Status post surgery 06/25/2018  . Diabetes mellitus with peripheral vascular disease ([place]) 06/19/2018  . Sepsis ([place]) 06/19/2018  . Back pain 06/19/2018  . Cellulitis of right lower extremity 06/19/2018  . Former smoker 06/19/2018  . Gangrene of toe ([place])   . Hyperglycemia   . Dehydration 07/07/2012  . Syncope 07/07/2012  . Hypotension 07/07/2012  . Leukocytosis 07/07/2012  . Hyperkalemia 07/07/2012   Past Medical History:  Diagnosis Date  . Arthritis   . Diabetes mellitus without complication ([place])   . Enlarged prostate   . Herniated lumbar intervertebral disc   . Neck pain     Family History  Problem  Relation Age of Onset  . Heart disease Mother   . Asthma Mother   . Heart disease Father     Past Surgical History:  Procedure Laterality Date  . ABDOMINAL SURGERY    . AMPUTATION Right 06/25/2018   Procedure: RIGHT FOOT 1ST RAY AMPUTATION;  Surgeon: [name], MD;  Location: [facility];  Service: Orthopedics;  Laterality: Right;  . LOWER EXTREMITY ANGIOGRAPHY N/A 06/21/2018   Procedure: LOWER EXTREMITY ANGIOGRAPHY;  Surgeon: [name], MD;  Location: [facility];  Service: Cardiovascular;  Laterality: N/A;  . open chest    . PERIPHERAL VASCULAR ATHERECTOMY Right 06/21/2018   Procedure: PERIPHERAL VASCULAR ATHERECTOMY w/ DCB;  Surgeon: [name], MD;  Location: [facility];  Service: Cardiovascular;  Laterality: Right;  Popliteal   Social History   Occupational History  . Not on file  Tobacco Use  . Smoking status: Former Smoker    Packs/day: 0.50    Years: 40.00    Pack years: 20.00    Types: Cigarettes  . Smokeless tobacco: Never Used  . Tobacco comment: quit on 06/14/2018  Substance and Sexual Activity  . Alcohol use: No  . Drug use: Yes    Types: Marijuana  . Sexual activity: Not Currently

## 2018-08-30 ENCOUNTER — Ambulatory Visit (INDEPENDENT_AMBULATORY_CARE_PROVIDER_SITE_OTHER): Payer: 59 | Admitting: Physician Assistant

## 2018-08-30 ENCOUNTER — Encounter (INDEPENDENT_AMBULATORY_CARE_PROVIDER_SITE_OTHER): Payer: Self-pay | Admitting: Orthopedic Surgery

## 2018-08-30 VITALS — Ht 68.0 in | Wt 176.0 lb

## 2018-08-30 DIAGNOSIS — E43 Unspecified severe protein-calorie malnutrition: Secondary | ICD-10-CM

## 2018-08-30 DIAGNOSIS — E1151 Type 2 diabetes mellitus with diabetic peripheral angiopathy without gangrene: Secondary | ICD-10-CM

## 2018-08-30 DIAGNOSIS — Z89411 Acquired absence of right great toe: Secondary | ICD-10-CM

## 2018-08-30 MED ORDER — SULFAMETHOXAZOLE-TRIMETHOPRIM 800-160 MG PO TABS: 1.0000 | ORAL_TABLET | Freq: Two times a day (BID) | ORAL | 0 refills | Status: DC

## 2018-08-30 NOTE — Progress Notes (Signed)
Office Visit Note   Patient: [name]           Date of Birth: [birth year]           MRN: [id number] Visit Date: 08/30/2018              Requested by: No referring provider defined for this encounter. PCP: Patient, No Pcp Per  Chief Complaint  Patient presents with  . Right Foot - Routine Post Op    06/25/18 right foot 1st ray amputation       HPI: The patient is a 63 year old gentleman who is seen for postoperative follow-up following a right first ray amputation on 06/25/2018.  He is undergone revascularization by Dr. Donzetta Matters to the right lower extremity 06/21/2018 prior to surgery.  He reports he has been nonweightbearing as much as possible.  He has been using some Iodosorb ointment to the residual incisional area and has been on doxycycline.  He is also on a nitroglycerin patch to the foot and Trental 400 mg 3 times daily in attempt to improve flow to the right foot.  The patient reports no pain over the area.  Assessment & Plan: Visit Diagnoses:  1. 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 Diabetes mellitus with peripheral vascular disease ([place])   3. Severe protein-calorie malnutrition ([place])     Plan: Recommend stop the doxycycline and start Septra DS 1 p.o. twice daily.  The remaining sutures were harvested today.  We discussed that is concerning that he is healing so slowly.  Elevate and nonweightbearing as much as possible.  Continue Trental 400 mg 3 times daily a nitroglycerin patch to the foot.  He will follow-up in 1 week.  Follow-Up Instructions: Return in about 1 week (around 09/06/2018).   Ortho Exam  Patient is alert, oriented, no adenopathy, well-dressed, normal affect, normal respiratory effort. There is continued dehiscence and crusting over the amputation site of the right foot.  The residual sutures were harvested.  He has ulceration over the second toe dorsal surface and third toe dorsal surface which are dry.  There is an ulcer between  the fourth and fifth toes which is nearly healed.  He has palpable pedal pulses.  Imaging: No results found.   Labs: Lab Results  Component Value Date   HGBA1C 7.8 (H) 06/20/2018   HGBA1C 8.5 (H) 07/08/2012   ESRSEDRATE 57 (H) 06/19/2018   CRP 10.5 (H) 06/19/2018   REPTSTATUS 06/24/2018 FINAL 06/19/2018   CULT  06/19/2018    NO GROWTH 5 DAYS Performed at [facility], [place] [address] [place], [place] [address]      Lab Results  Component Value Date   ALBUMIN 2.7 (L) 06/26/2018   ALBUMIN 2.6 (L) 06/25/2018   ALBUMIN 3.7 06/19/2018    Body mass index is 26.76 kg/m.  Orders:  No orders of the defined types were placed in this encounter.  Meds ordered this encounter  Medications  . sulfamethoxazole-trimethoprim (BACTRIM DS,SEPTRA DS) 800-160 MG tablet    Sig: Take 1 tablet by mouth 2 (two) times daily.    Dispense:  28 tablet    Refill:  0     Procedures: No procedures performed  Clinical Data: No additional findings.  ROS:  All other systems negative, except as noted in the HPI. Review of Systems  Objective: Vital Signs: Ht 5\' 8"  (1.727 m)   Wt 176 lb (79.8 kg)   BMI 26.76 kg/m  Specialty Comments:  No specialty comments available.  PMFS History: Patient Active Problem List   Diagnosis Date Noted  . Amputated great toe, right ([place]) 07/05/2018  . Status post surgery 06/25/2018  . Diabetes mellitus with peripheral vascular disease ([place]) 06/19/2018  . Sepsis ([place]) 06/19/2018  . Back pain 06/19/2018  . Cellulitis of right lower extremity 06/19/2018  . Former smoker 06/19/2018  . Gangrene of toe ([place])   . Hyperglycemia   . Dehydration 07/07/2012  . Syncope 07/07/2012  . Hypotension 07/07/2012  . Leukocytosis 07/07/2012  . Hyperkalemia 07/07/2012   Past Medical History:  Diagnosis Date  . Arthritis   . Diabetes mellitus without complication ([place])   . Enlarged prostate   . Herniated lumbar intervertebral disc   . Neck pain       Family History  Problem Relation Age of Onset  . Heart disease Mother   . Asthma Mother   . Heart disease Father     Past Surgical History:  Procedure Laterality Date  . ABDOMINAL SURGERY    . AMPUTATION Right 06/25/2018   Procedure: RIGHT FOOT 1ST RAY AMPUTATION;  Surgeon: [name], MD;  Location: [facility];  Service: Orthopedics;  Laterality: Right;  . LOWER EXTREMITY ANGIOGRAPHY N/A 06/21/2018   Procedure: LOWER EXTREMITY ANGIOGRAPHY;  Surgeon: [name], MD;  Location: [facility];  Service: Cardiovascular;  Laterality: N/A;  . open chest    . PERIPHERAL VASCULAR ATHERECTOMY Right 06/21/2018   Procedure: PERIPHERAL VASCULAR ATHERECTOMY w/ DCB;  Surgeon: [name], MD;  Location: [facility];  Service: Cardiovascular;  Laterality: Right;  Popliteal   Social History   Occupational History  . Not on file  Tobacco Use  . Smoking status: Former Smoker    Packs/day: 0.50    Years: 40.00    Pack years: 20.00    Types: Cigarettes  . Smokeless tobacco: Never Used  . Tobacco comment: quit on 06/14/2018  Substance and Sexual Activity  . Alcohol use: No  . Drug use: Yes    Types: Marijuana  . Sexual activity: Not Currently

## 2018-09-01 ENCOUNTER — Encounter (INDEPENDENT_AMBULATORY_CARE_PROVIDER_SITE_OTHER): Payer: Self-pay | Admitting: Physician Assistant

## 2018-09-06 ENCOUNTER — Encounter (INDEPENDENT_AMBULATORY_CARE_PROVIDER_SITE_OTHER): Payer: Self-pay | Admitting: Physician Assistant

## 2018-09-06 ENCOUNTER — Ambulatory Visit (INDEPENDENT_AMBULATORY_CARE_PROVIDER_SITE_OTHER): Payer: 59 | Admitting: Physician Assistant

## 2018-09-06 VITALS — Ht 68.0 in | Wt 176.0 lb

## 2018-09-06 DIAGNOSIS — E43 Unspecified severe protein-calorie malnutrition: Secondary | ICD-10-CM

## 2018-09-06 DIAGNOSIS — E1151 Type 2 diabetes mellitus with diabetic peripheral angiopathy without gangrene: Secondary | ICD-10-CM

## 2018-09-06 DIAGNOSIS — Z89411 Acquired absence of right great toe: Secondary | ICD-10-CM

## 2018-09-06 MED ORDER — SULFAMETHOXAZOLE-TRIMETHOPRIM 800-160 MG PO TABS: 1.0000 | ORAL_TABLET | Freq: Two times a day (BID) | ORAL | 0 refills | Status: DC

## 2018-09-06 NOTE — Progress Notes (Signed)
Office Visit Note   Patient: [name]           Date of Birth: 1955-11-15           MRN: [id number] Visit Date: 09/06/2018              Requested by: No referring provider defined for this encounter. PCP: Patient, No Pcp Per  Chief Complaint  Patient presents with  . Right Foot - Routine Post Op    06/25/18 right foot 1st ray amputation       HPI: The patient is a 63 year old gentleman who is seen for postoperative follow-up following right first ray amputation on 06/25/2018.  He had undergone revascularization by Dr. Donzetta Matters to the right lower extremity on 06/21/2018 prior to the amputation. He continues on Septra DS 1 p.o. twice daily, Trental 400 mg 3 times daily as well as a nitroglycerin patch to the foot to aid flow to the right foot.  He reports no pain over the area.  Assessment & Plan: Visit Diagnoses:  1. 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 Diabetes mellitus with peripheral vascular disease ([place])   3. Severe protein-calorie malnutrition (HCC)     Plan: Continue Septra DS 1 p.o. twice daily, elevate the foot as much as possible, continue nitroglycerin patches to the foot as well as Trental 400 mg 3 times daily.  Continue nonweightbearing is much as possible.  We placed a XL stump shrinker stocking on the residual foot today and the patient should continue to utilize this daily for edema control except for when he is showering.  He will follow-up in 2 weeks.  Follow-Up Instructions: Return in about 2 weeks (around 09/20/2018).   Ortho Exam  Patient is alert, oriented, no adenopathy, well-dressed, normal affect, normal respiratory effort. There is increased erythema and edema of the foot today.  The amputation site has minimal serosanguineous appearing drainage.  There is increased crusting about the incisional area.  The ulceration over the second toe appears stable but all of the toes appear more edematous and erythematous overall.  He has  palpable pedal pulse today.    Imaging: No results found. No images are attached to the encounter.  Labs: Lab Results  Component Value Date   HGBA1C 7.8 (H) 06/20/2018   HGBA1C 8.5 (H) 07/08/2012   ESRSEDRATE 57 (H) 06/19/2018   CRP 10.5 (H) 06/19/2018   REPTSTATUS 06/24/2018 FINAL 06/19/2018   CULT  06/19/2018    NO GROWTH 5 DAYS Performed at [facility], [place] [address], [place], [place] [address]      Lab Results  Component Value Date   ALBUMIN 2.7 (L) 06/26/2018   ALBUMIN 2.6 (L) 06/25/2018   ALBUMIN 3.7 06/19/2018    Body mass index is 26.76 kg/m.  Orders:  No orders of the defined types were placed in this encounter.  Meds ordered this encounter  Medications  . sulfamethoxazole-trimethoprim (BACTRIM DS,SEPTRA DS) 800-160 MG tablet    Sig: Take 1 tablet by mouth 2 (two) times daily.    Dispense:  28 tablet    Refill:  0     Procedures: No procedures performed  Clinical Data: No additional findings.  ROS:  All other systems negative, except as noted in the HPI. Review of Systems  Objective: Vital Signs: Ht 5\' 8"  (1.727 m)   Wt 176 lb (79.8 kg)   BMI 26.76 kg/m   Specialty Comments:  No specialty comments available.  PMFS History: Patient Active Problem List   Diagnosis Date Noted  . Amputated great toe, right ([place]) 07/05/2018  . Status post surgery 06/25/2018  . Diabetes mellitus with peripheral vascular disease ([place]) 06/19/2018  . Sepsis ([place]) 06/19/2018  . Back pain 06/19/2018  . Cellulitis of right lower extremity 06/19/2018  . Former smoker 06/19/2018  . Gangrene of toe ([place])   . Hyperglycemia   . Dehydration 07/07/2012  . Syncope 07/07/2012  . Hypotension 07/07/2012  . Leukocytosis 07/07/2012  . Hyperkalemia 07/07/2012   Past Medical History:  Diagnosis Date  . Arthritis   . Diabetes mellitus without complication ([place])   . Enlarged prostate   . Herniated lumbar intervertebral disc   . Neck pain     Family History   Problem Relation Age of Onset  . Heart disease Mother   . Asthma Mother   . Heart disease Father     Past Surgical History:  Procedure Laterality Date  . ABDOMINAL SURGERY    . AMPUTATION Right 06/25/2018   Procedure: RIGHT FOOT 1ST RAY AMPUTATION;  Surgeon: [name], MD;  Location: [facility];  Service: Orthopedics;  Laterality: Right;  . LOWER EXTREMITY ANGIOGRAPHY N/A 06/21/2018   Procedure: LOWER EXTREMITY ANGIOGRAPHY;  Surgeon: [name], MD;  Location: [facility];  Service: Cardiovascular;  Laterality: N/A;  . open chest    . PERIPHERAL VASCULAR ATHERECTOMY Right 06/21/2018   Procedure: PERIPHERAL VASCULAR ATHERECTOMY w/ DCB;  Surgeon: [name], MD;  Location: [facility];  Service: Cardiovascular;  Laterality: Right;  Popliteal   Social History   Occupational History  . Not on file  Tobacco Use  . Smoking status: Former Smoker    Packs/day: 0.50    Years: 40.00    Pack years: 20.00    Types: Cigarettes  . Smokeless tobacco: Never Used  . Tobacco comment: quit on 06/14/2018  Substance and Sexual Activity  . Alcohol use: No  . Drug use: Yes    Types: Marijuana  . Sexual activity: Not Currently

## 2018-09-08 ENCOUNTER — Encounter (INDEPENDENT_AMBULATORY_CARE_PROVIDER_SITE_OTHER): Payer: Self-pay | Admitting: Physician Assistant

## 2018-09-20 ENCOUNTER — Ambulatory Visit (INDEPENDENT_AMBULATORY_CARE_PROVIDER_SITE_OTHER): Payer: 59 | Admitting: Physician Assistant

## 2018-09-20 ENCOUNTER — Encounter (INDEPENDENT_AMBULATORY_CARE_PROVIDER_SITE_OTHER): Payer: Self-pay | Admitting: Orthopedic Surgery

## 2018-09-20 ENCOUNTER — Ambulatory Visit (INDEPENDENT_AMBULATORY_CARE_PROVIDER_SITE_OTHER): Payer: Self-pay | Admitting: Physician Assistant

## 2018-09-20 VITALS — Ht 68.0 in | Wt 176.0 lb

## 2018-09-20 DIAGNOSIS — E43 Unspecified severe protein-calorie malnutrition: Secondary | ICD-10-CM | POA: Diagnosis not present

## 2018-09-20 DIAGNOSIS — Z89411 Acquired absence of right great toe: Secondary | ICD-10-CM | POA: Diagnosis not present

## 2018-09-20 DIAGNOSIS — E1151 Type 2 diabetes mellitus with diabetic peripheral angiopathy without gangrene: Secondary | ICD-10-CM | POA: Diagnosis not present

## 2018-09-20 NOTE — Progress Notes (Signed)
Office Visit Note   Patient: [name]           Date of Birth: 02-10-1956           MRN: [id number] Visit Date: 09/20/2018              Requested by: No referring provider defined for this encounter. PCP: Patient, No Pcp Per  Chief Complaint  Patient presents with  . Right Foot - Follow-up      HPI: The patient is a 63 year old gentleman who is seen for postoperative follow-up following right first ray amputation on 06/25/2018.  He had undergone revascularization by Dr. Donzetta Matters to the right lower extremity on 06/21/2018 prior to the amputation. He has had difficulty narrowing the amputation site and is subsequently developed a gangrenous changes of the second and third toes.  More recently has been on Septra DS but reports significant nausea and vomiting with this.  He continues to have purulent drainage from the operative site as well as the second toe ulceration.  He was also placed on Trental 400 mg 3 times daily and a nitroglycerin patch to aid blood flow to the right foot but feels like the Trental is also making him nauseated.  Assessment & Plan: Visit Diagnoses:  1. 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 Diabetes mellitus with peripheral vascular disease ([place])   3. Severe protein-calorie malnutrition ([place])     Plan: Plan to return to the OR later this week for a right transmetatarsal amputation for osteomyelitis of the second and third toes.  He will follow-up in the office postoperatively.  Follow-Up Instructions: Return in about 1 week (around 09/27/2018).   Ortho Exam  Patient is alert, oriented, no adenopathy, well-dressed, normal affect, normal respiratory effort. The erythema of the foot and edema are overall improved but he has worsening gangrenous changes of the dorsum of the second toe with purulent appearing drainage underneath the ulcer and instability at the joint consistent with osteomyelitis.  There is visible/palpable bone in the  the tip of the third toe as well.  His pulses are biphasic by Doppler. Imaging: No results found. No images are attached to the encounter.  Labs: Lab Results  Component Value Date   HGBA1C 7.8 (H) 06/20/2018   HGBA1C 8.5 (H) 07/08/2012   ESRSEDRATE 57 (H) 06/19/2018   CRP 10.5 (H) 06/19/2018   REPTSTATUS 06/24/2018 FINAL 06/19/2018   CULT  06/19/2018    NO GROWTH 5 DAYS Performed at [facility], [place] [address], [place], [place] [address]      Lab Results  Component Value Date   ALBUMIN 2.7 (L) 06/26/2018   ALBUMIN 2.6 (L) 06/25/2018   ALBUMIN 3.7 06/19/2018    Body mass index is 26.76 kg/m.  Orders:  No orders of the defined types were placed in this encounter.  No orders of the defined types were placed in this encounter.    Procedures: No procedures performed  Clinical Data: No additional findings.  ROS:  All other systems negative, except as noted in the HPI. Review of Systems  Objective: Vital Signs: Ht 5\' 8"  (1.727 m)   Wt 176 lb (79.8 kg)   BMI 26.76 kg/m   Specialty Comments:  No specialty comments available.  PMFS History: Patient Active Problem List   Diagnosis Date Noted  . Amputated great toe, right ([place]) 07/05/2018  . Status post surgery 06/25/2018  . Diabetes mellitus with peripheral vascular disease ([place])  06/19/2018  . Sepsis ([place]) 06/19/2018  . Back pain 06/19/2018  . Cellulitis of right lower extremity 06/19/2018  . Former smoker 06/19/2018  . Gangrene of toe ([place])   . Hyperglycemia   . Dehydration 07/07/2012  . Syncope 07/07/2012  . Hypotension 07/07/2012  . Leukocytosis 07/07/2012  . Hyperkalemia 07/07/2012   Past Medical History:  Diagnosis Date  . Arthritis   . Diabetes mellitus without complication ([place])   . Enlarged prostate   . Herniated lumbar intervertebral disc   . Neck pain     Family History  Problem Relation Age of Onset  . Heart disease Mother   . Asthma Mother   . Heart disease Father       Past Surgical History:  Procedure Laterality Date  . ABDOMINAL SURGERY    . AMPUTATION Right 06/25/2018   Procedure: RIGHT FOOT 1ST RAY AMPUTATION;  Surgeon: [name], MD;  Location: [facility];  Service: Orthopedics;  Laterality: Right;  . LOWER EXTREMITY ANGIOGRAPHY N/A 06/21/2018   Procedure: LOWER EXTREMITY ANGIOGRAPHY;  Surgeon: [name], MD;  Location: [facility];  Service: Cardiovascular;  Laterality: N/A;  . open chest    . PERIPHERAL VASCULAR ATHERECTOMY Right 06/21/2018   Procedure: PERIPHERAL VASCULAR ATHERECTOMY w/ DCB;  Surgeon: [name], MD;  Location: [facility];  Service: Cardiovascular;  Laterality: Right;  Popliteal   Social History   Occupational History  . Not on file  Tobacco Use  . Smoking status: Former Smoker    Packs/day: 0.50    Years: 40.00    Pack years: 20.00    Types: Cigarettes  . Smokeless tobacco: Never Used  . Tobacco comment: quit on 06/14/2018  Substance and Sexual Activity  . Alcohol use: No  . Drug use: Yes    Types: Marijuana  . Sexual activity: Not Currently

## 2018-09-21 ENCOUNTER — Encounter (INDEPENDENT_AMBULATORY_CARE_PROVIDER_SITE_OTHER): Payer: Self-pay | Admitting: Physician Assistant

## 2018-09-21 ENCOUNTER — Other Ambulatory Visit: Payer: Self-pay

## 2018-09-21 ENCOUNTER — Encounter (HOSPITAL_COMMUNITY): Payer: Self-pay | Admitting: *Deleted

## 2018-09-21 ENCOUNTER — Ambulatory Visit (INDEPENDENT_AMBULATORY_CARE_PROVIDER_SITE_OTHER): Payer: Self-pay | Admitting: Physician Assistant

## 2018-09-21 NOTE — Addendum Note (Signed)
Addended by: Meridee Score on: 09/21/2018 10:05 AM   Modules accepted: Level of Service

## 2018-09-21 NOTE — Progress Notes (Signed)
[name] denies chest pain or shortness.  [name] reports that he is not taking any medications except Aspirin 81. [name] states he manages his diabetes, " My sugar was fine until I was in the hospital and you should see the food they fed me." I asked patient if he checks his CBG at home, "no I'm fine when I am at home., I am healthy."  [name] denies having a PCP.  I in boxed Dr. Sharol Given and  Erlinda Hong, PA- C to let them know that patient is only taking Aspirin.

## 2018-09-22 ENCOUNTER — Other Ambulatory Visit: Payer: Self-pay

## 2018-09-22 ENCOUNTER — Inpatient Hospital Stay (HOSPITAL_COMMUNITY): Payer: 59 | Admitting: Anesthesiology

## 2018-09-22 ENCOUNTER — Inpatient Hospital Stay (HOSPITAL_COMMUNITY)
Admission: RE | Admit: 2018-09-22 | Discharge: 2018-09-23 | DRG: 616 | Disposition: A | Discharge status: Home / Self Care | Payer: 59 | Attending: Orthopedic Surgery | Admitting: Orthopedic Surgery

## 2018-09-22 ENCOUNTER — Encounter (HOSPITAL_COMMUNITY): Payer: Self-pay | Admitting: Orthopedic Surgery

## 2018-09-22 ENCOUNTER — Encounter (HOSPITAL_COMMUNITY)
Admission: RE | Disposition: A | Discharge status: Home / Self Care | Payer: Self-pay | Source: Home / Self Care | Attending: Orthopedic Surgery

## 2018-09-22 DIAGNOSIS — I96 Gangrene, not elsewhere classified: Secondary | ICD-10-CM | POA: Diagnosis present

## 2018-09-22 DIAGNOSIS — Z85828 Personal history of other malignant neoplasm of skin: Secondary | ICD-10-CM | POA: Diagnosis not present

## 2018-09-22 DIAGNOSIS — Z888 Allergy status to other drugs, medicaments and biological substances status: Secondary | ICD-10-CM | POA: Diagnosis not present

## 2018-09-22 DIAGNOSIS — M869 Osteomyelitis, unspecified: Secondary | ICD-10-CM | POA: Diagnosis present

## 2018-09-22 DIAGNOSIS — Z6826 Body mass index (BMI) 26.0-26.9, adult: Secondary | ICD-10-CM | POA: Diagnosis not present

## 2018-09-22 DIAGNOSIS — Z91048 Other nonmedicinal substance allergy status: Secondary | ICD-10-CM | POA: Diagnosis not present

## 2018-09-22 DIAGNOSIS — E43 Unspecified severe protein-calorie malnutrition: Secondary | ICD-10-CM | POA: Diagnosis present

## 2018-09-22 DIAGNOSIS — Z8701 Personal history of pneumonia (recurrent): Secondary | ICD-10-CM | POA: Diagnosis not present

## 2018-09-22 DIAGNOSIS — E1169 Type 2 diabetes mellitus with other specified complication: Secondary | ICD-10-CM | POA: Diagnosis present

## 2018-09-22 DIAGNOSIS — L97519 Non-pressure chronic ulcer of other part of right foot with unspecified severity: Secondary | ICD-10-CM | POA: Diagnosis present

## 2018-09-22 DIAGNOSIS — E1152 Type 2 diabetes mellitus with diabetic peripheral angiopathy with gangrene: Secondary | ICD-10-CM | POA: Diagnosis present

## 2018-09-22 DIAGNOSIS — E1151 Type 2 diabetes mellitus with diabetic peripheral angiopathy without gangrene: Secondary | ICD-10-CM | POA: Diagnosis present

## 2018-09-22 DIAGNOSIS — Z89431 Acquired absence of right foot: Secondary | ICD-10-CM | POA: Diagnosis not present

## 2018-09-22 DIAGNOSIS — Z87891 Personal history of nicotine dependence: Secondary | ICD-10-CM

## 2018-09-22 DIAGNOSIS — E11621 Type 2 diabetes mellitus with foot ulcer: Secondary | ICD-10-CM | POA: Diagnosis present

## 2018-09-22 DIAGNOSIS — Z881 Allergy status to other antibiotic agents status: Secondary | ICD-10-CM

## 2018-09-22 DIAGNOSIS — M1990 Unspecified osteoarthritis, unspecified site: Secondary | ICD-10-CM | POA: Diagnosis present

## 2018-09-22 HISTORY — DX: Malignant (primary) neoplasm, unspecified: C80.1

## 2018-09-22 HISTORY — DX: Pneumonia, unspecified organism: J18.9

## 2018-09-22 HISTORY — PX: AMPUTATION: SHX166

## 2018-09-22 LAB — BASIC METABOLIC PANEL
Anion gap: 9 (ref 5–15)
BUN: 11 mg/dL (ref 8–23)
CO2: 28 mmol/L (ref 22–32)
Calcium: 9.7 mg/dL (ref 8.9–10.3)
Chloride: 100 mmol/L (ref 98–111)
Creatinine, Ser: 0.83 mg/dL (ref 0.61–1.24)
GFR calc Af Amer: >60 mL/min (ref >60)
GFR calc non Af Amer: >60 mL/min (ref >60)
Glucose, Bld: 144 mg/dL — ABNORMAL HIGH (ref 70–99)
Potassium: 4.7 mmol/L (ref 3.5–5.1)
Sodium: 137 mmol/L (ref 135–145)

## 2018-09-22 LAB — GLUCOSE, CAPILLARY
Glucose-Capillary: 132 mg/dL — ABNORMAL HIGH (ref 70–99)
Glucose-Capillary: 123 mg/dL — ABNORMAL HIGH (ref 70–99)
Glucose-Capillary: 134 mg/dL — ABNORMAL HIGH (ref 70–99)

## 2018-09-22 SURGERY — AMPUTATION, FOOT, PARTIAL
Anesthesia: General | Site: Foot | Laterality: Right

## 2018-09-22 MED ORDER — ONDANSETRON HCL 4 MG/2ML IJ SOLN: 4.0000 mg | Freq: Once | INTRAMUSCULAR | Status: DC | PRN

## 2018-09-22 MED ORDER — METOCLOPRAMIDE HCL 5 MG/ML IJ SOLN
5.0000 mg | Freq: Three times a day (TID) | INTRAMUSCULAR | Status: DC | PRN
  Administered 2018-09-23: 10 mg via INTRAVENOUS
  Filled 2018-09-22: qty 2

## 2018-09-22 MED ORDER — MAGNESIUM CITRATE PO SOLN: 1.0000 | Freq: Once | ORAL | Status: DC | PRN

## 2018-09-22 MED ORDER — LIDOCAINE 2% (20 MG/ML) 5 ML SYRINGE
INTRAMUSCULAR | Status: DC | PRN
  Administered 2018-09-22: 70 mg via INTRAVENOUS

## 2018-09-22 MED ORDER — LACTATED RINGERS IV SOLN
INTRAVENOUS | Status: DC
  Administered 2018-09-22: 11:00:00 via INTRAVENOUS

## 2018-09-22 MED ORDER — FENTANYL CITRATE (PF) 250 MCG/5ML IJ SOLN
INTRAMUSCULAR | Status: AC
  Filled 2018-09-22: qty 5

## 2018-09-22 MED ORDER — INSULIN ASPART 100 UNIT/ML ~~LOC~~ SOLN: 0.0000 [IU] | Freq: Three times a day (TID) | SUBCUTANEOUS | Status: DC

## 2018-09-22 MED ORDER — BISACODYL 10 MG RE SUPP: 10.0000 mg | Freq: Every day | RECTAL | Status: DC | PRN

## 2018-09-22 MED ORDER — ONDANSETRON HCL 4 MG PO TABS: 4.0000 mg | ORAL_TABLET | Freq: Four times a day (QID) | ORAL | Status: DC | PRN

## 2018-09-22 MED ORDER — FENTANYL CITRATE (PF) 100 MCG/2ML IJ SOLN
25.0000 ug | INTRAMUSCULAR | Status: DC | PRN
  Administered 2018-09-22 (×3): 25 ug via INTRAVENOUS

## 2018-09-22 MED ORDER — MEPERIDINE HCL 50 MG/ML IJ SOLN: 6.2500 mg | INTRAMUSCULAR | Status: DC | PRN

## 2018-09-22 MED ORDER — METHOCARBAMOL 1000 MG/10ML IJ SOLN
500.0000 mg | Freq: Four times a day (QID) | INTRAVENOUS | Status: DC | PRN
  Filled 2018-09-22: qty 5

## 2018-09-22 MED ORDER — CEFAZOLIN SODIUM-DEXTROSE 2-4 GM/100ML-% IV SOLN
2.0000 g | INTRAVENOUS | Status: AC
  Administered 2018-09-22: 2 g via INTRAVENOUS

## 2018-09-22 MED ORDER — PROPOFOL 10 MG/ML IV BOLUS
INTRAVENOUS | Status: AC
  Filled 2018-09-22: qty 20

## 2018-09-22 MED ORDER — EPHEDRINE 5 MG/ML INJ
INTRAVENOUS | Status: AC
  Filled 2018-09-22: qty 10

## 2018-09-22 MED ORDER — OXYCODONE HCL 5 MG PO TABS
5.0000 mg | ORAL_TABLET | Freq: Once | ORAL | Status: AC | PRN
  Administered 2018-09-22: 5 mg via ORAL

## 2018-09-22 MED ORDER — ACETAMINOPHEN 325 MG PO TABS: 325.0000 mg | ORAL_TABLET | Freq: Four times a day (QID) | ORAL | Status: DC | PRN

## 2018-09-22 MED ORDER — PROPOFOL 10 MG/ML IV BOLUS
INTRAVENOUS | Status: DC | PRN
  Administered 2018-09-22: 130 mg via INTRAVENOUS

## 2018-09-22 MED ORDER — FENTANYL CITRATE (PF) 100 MCG/2ML IJ SOLN
INTRAMUSCULAR | Status: AC
  Filled 2018-09-22: qty 2

## 2018-09-22 MED ORDER — CEFAZOLIN SODIUM-DEXTROSE 1-4 GM/50ML-% IV SOLN
1.0000 g | Freq: Four times a day (QID) | INTRAVENOUS | Status: AC
  Administered 2018-09-22 – 2018-09-23 (×3): 1 g via INTRAVENOUS
  Filled 2018-09-22 (×3): qty 50

## 2018-09-22 MED ORDER — ASPIRIN EC 325 MG PO TBEC
325.0000 mg | DELAYED_RELEASE_TABLET | Freq: Every day | ORAL | Status: DC
  Administered 2018-09-22 – 2018-09-23 (×2): 325 mg via ORAL
  Filled 2018-09-22 (×2): qty 1

## 2018-09-22 MED ORDER — METHOCARBAMOL 500 MG PO TABS
ORAL_TABLET | ORAL | Status: AC
  Filled 2018-09-22: qty 1

## 2018-09-22 MED ORDER — OXYCODONE HCL 5 MG/5ML PO SOLN: 5.0000 mg | Freq: Once | ORAL | Status: AC | PRN

## 2018-09-22 MED ORDER — ACETAMINOPHEN 160 MG/5ML PO SOLN: 325.0000 mg | ORAL | Status: DC | PRN

## 2018-09-22 MED ORDER — METOCLOPRAMIDE HCL 5 MG PO TABS: 5.0000 mg | ORAL_TABLET | Freq: Three times a day (TID) | ORAL | Status: DC | PRN

## 2018-09-22 MED ORDER — POLYETHYLENE GLYCOL 3350 17 G PO PACK: 17.0000 g | PACK | Freq: Every day | ORAL | Status: DC | PRN

## 2018-09-22 MED ORDER — METHOCARBAMOL 500 MG PO TABS
500.0000 mg | ORAL_TABLET | Freq: Four times a day (QID) | ORAL | Status: DC | PRN
  Administered 2018-09-22 – 2018-09-23 (×4): 500 mg via ORAL
  Filled 2018-09-22 (×3): qty 1

## 2018-09-22 MED ORDER — CEFAZOLIN SODIUM-DEXTROSE 2-4 GM/100ML-% IV SOLN
INTRAVENOUS | Status: AC
  Filled 2018-09-22: qty 100

## 2018-09-22 MED ORDER — LIDOCAINE 2% (20 MG/ML) 5 ML SYRINGE
INTRAMUSCULAR | Status: AC
  Filled 2018-09-22: qty 5

## 2018-09-22 MED ORDER — DOCUSATE SODIUM 100 MG PO CAPS
100.0000 mg | ORAL_CAPSULE | Freq: Two times a day (BID) | ORAL | Status: DC
  Administered 2018-09-22 – 2018-09-23 (×2): 100 mg via ORAL
  Filled 2018-09-22 (×2): qty 1

## 2018-09-22 MED ORDER — 0.9 % SODIUM CHLORIDE (POUR BTL) OPTIME
TOPICAL | Status: DC | PRN
  Administered 2018-09-22: 1000 mL

## 2018-09-22 MED ORDER — MIDAZOLAM HCL 2 MG/2ML IJ SOLN
INTRAMUSCULAR | Status: AC
  Filled 2018-09-22: qty 2

## 2018-09-22 MED ORDER — ONDANSETRON HCL 4 MG/2ML IJ SOLN
4.0000 mg | Freq: Four times a day (QID) | INTRAMUSCULAR | Status: DC | PRN
  Administered 2018-09-23: 4 mg via INTRAVENOUS
  Filled 2018-09-22: qty 2

## 2018-09-22 MED ORDER — ACETAMINOPHEN 325 MG PO TABS: 325.0000 mg | ORAL_TABLET | ORAL | Status: DC | PRN

## 2018-09-22 MED ORDER — ARTIFICIAL TEARS OPHTHALMIC OINT
TOPICAL_OINTMENT | OPHTHALMIC | Status: AC
  Filled 2018-09-22: qty 3.5

## 2018-09-22 MED ORDER — EPHEDRINE SULFATE-NACL 50-0.9 MG/10ML-% IV SOSY
PREFILLED_SYRINGE | INTRAVENOUS | Status: DC | PRN
  Administered 2018-09-22: 10 mg via INTRAVENOUS
  Administered 2018-09-22: 5 mg via INTRAVENOUS

## 2018-09-22 MED ORDER — PHENYLEPHRINE 40 MCG/ML (10ML) SYRINGE FOR IV PUSH (FOR BLOOD PRESSURE SUPPORT)
PREFILLED_SYRINGE | INTRAVENOUS | Status: AC
  Filled 2018-09-22: qty 10

## 2018-09-22 MED ORDER — INSULIN ASPART 100 UNIT/ML ~~LOC~~ SOLN
4.0000 [IU] | Freq: Three times a day (TID) | SUBCUTANEOUS | Status: DC
  Administered 2018-09-23: 4 [IU] via SUBCUTANEOUS

## 2018-09-22 MED ORDER — PHENYLEPHRINE 40 MCG/ML (10ML) SYRINGE FOR IV PUSH (FOR BLOOD PRESSURE SUPPORT)
PREFILLED_SYRINGE | INTRAVENOUS | Status: DC | PRN
  Administered 2018-09-22 (×2): 80 ug via INTRAVENOUS
  Administered 2018-09-22 (×2): 120 ug via INTRAVENOUS

## 2018-09-22 MED ORDER — CHLORHEXIDINE GLUCONATE 4 % EX LIQD: 60.0000 mL | Freq: Once | CUTANEOUS | Status: DC

## 2018-09-22 MED ORDER — OXYCODONE HCL 5 MG PO TABS
ORAL_TABLET | ORAL | Status: AC
  Filled 2018-09-22: qty 1

## 2018-09-22 MED ORDER — ONDANSETRON HCL 4 MG/2ML IJ SOLN
INTRAMUSCULAR | Status: AC
  Filled 2018-09-22: qty 2

## 2018-09-22 MED ORDER — HYDROMORPHONE HCL 1 MG/ML IJ SOLN
0.5000 mg | INTRAMUSCULAR | Status: DC | PRN
  Administered 2018-09-23 (×2): 1 mg via INTRAVENOUS
  Filled 2018-09-22 (×2): qty 1

## 2018-09-22 MED ORDER — SODIUM CHLORIDE 0.9 % IV SOLN
INTRAVENOUS | Status: DC
  Administered 2018-09-22: 17:00:00 via INTRAVENOUS

## 2018-09-22 MED ORDER — OXYCODONE HCL 5 MG PO TABS
5.0000 mg | ORAL_TABLET | ORAL | Status: DC | PRN
  Administered 2018-09-22: 5 mg via ORAL
  Administered 2018-09-23: 10 mg via ORAL
  Filled 2018-09-22: qty 2
  Filled 2018-09-22: qty 1
  Filled 2018-09-22: qty 2

## 2018-09-22 MED ORDER — FENTANYL CITRATE (PF) 250 MCG/5ML IJ SOLN
INTRAMUSCULAR | Status: DC | PRN
  Administered 2018-09-22: 50 ug via INTRAVENOUS

## 2018-09-22 MED ORDER — ONDANSETRON HCL 4 MG/2ML IJ SOLN
INTRAMUSCULAR | Status: DC | PRN
  Administered 2018-09-22: 4 mg via INTRAVENOUS

## 2018-09-22 MED ORDER — MIDAZOLAM HCL 2 MG/2ML IJ SOLN
INTRAMUSCULAR | Status: DC | PRN
  Administered 2018-09-22: 2 mg via INTRAVENOUS

## 2018-09-22 MED ORDER — OXYCODONE HCL 5 MG PO TABS
10.0000 mg | ORAL_TABLET | ORAL | Status: DC | PRN
  Administered 2018-09-23: 10 mg via ORAL
  Administered 2018-09-23 (×2): 15 mg via ORAL
  Filled 2018-09-22 (×2): qty 3

## 2018-09-22 SURGICAL SUPPLY — 40 items
APL SKNCLS STERI-STRIP NONHPOA (GAUZE/BANDAGES/DRESSINGS) ×1
BENZOIN TINCTURE PRP APPL 2/3 (GAUZE/BANDAGES/DRESSINGS) ×2 IMPLANT
BLADE SAW SGTL HD 18.5X60.5X1. (BLADE) ×2 IMPLANT
BLADE SURG 21 STRL SS (BLADE) ×2 IMPLANT
BNDG COHESIVE 4X5 TAN STRL (GAUZE/BANDAGES/DRESSINGS) IMPLANT
BNDG COHESIVE 6X5 TAN STRL LF (GAUZE/BANDAGES/DRESSINGS) ×1 IMPLANT
BNDG GAUZE ELAST 4 BULKY (GAUZE/BANDAGES/DRESSINGS) IMPLANT
CANISTER WOUNDNEG PRESSURE 500 (CANNISTER) ×1 IMPLANT
COVER SURGICAL LIGHT HANDLE (MISCELLANEOUS) ×2 IMPLANT
COVER WAND RF STERILE (DRAPES) ×1 IMPLANT
DRAPE INCISE IOBAN 66X45 STRL (DRAPES) ×3 IMPLANT
DRAPE U-SHAPE 47X51 STRL (DRAPES) ×2 IMPLANT
DRESSING PEEL AND PLAC PRVNA20 (GAUZE/BANDAGES/DRESSINGS) IMPLANT
DRSG ADAPTIC 3X8 NADH LF (GAUZE/BANDAGES/DRESSINGS) IMPLANT
DRSG PAD ABDOMINAL 8X10 ST (GAUZE/BANDAGES/DRESSINGS) IMPLANT
DRSG PEEL AND PLACE PREVENA 20 (GAUZE/BANDAGES/DRESSINGS) ×2
DRSG VAC ATS SM SENSATRAC (GAUZE/BANDAGES/DRESSINGS) ×1 IMPLANT
DURAPREP 26ML APPLICATOR (WOUND CARE) ×1 IMPLANT
ELECT REM PT RETURN 9FT ADLT (ELECTROSURGICAL) ×2
ELECTRODE REM PT RTRN 9FT ADLT (ELECTROSURGICAL) ×1 IMPLANT
GAUZE SPONGE 4X4 12PLY STRL (GAUZE/BANDAGES/DRESSINGS) IMPLANT
GLOVE BIOGEL PI IND STRL 9 (GLOVE) ×1 IMPLANT
GLOVE BIOGEL PI INDICATOR 9 (GLOVE) ×1
GLOVE SURG ORTHO 9.0 STRL STRW (GLOVE) ×2 IMPLANT
GOWN STRL REUS W/ TWL XL LVL3 (GOWN DISPOSABLE) ×3 IMPLANT
GOWN STRL REUS W/TWL XL LVL3 (GOWN DISPOSABLE) ×6
KIT BASIN OR (CUSTOM PROCEDURE TRAY) ×2 IMPLANT
KIT DRSG PREVENA PLUS 7DAY 125 (MISCELLANEOUS) ×1 IMPLANT
KIT TURNOVER KIT B (KITS) ×2 IMPLANT
NS IRRIG 1000ML POUR BTL (IV SOLUTION) ×2 IMPLANT
PACK ORTHO EXTREMITY (CUSTOM PROCEDURE TRAY) ×2 IMPLANT
PAD ARMBOARD 7.5X6 YLW CONV (MISCELLANEOUS) ×2 IMPLANT
SPONGE LAP 18X18 RF (DISPOSABLE) IMPLANT
SUT ETHILON 2 0 PSLX (SUTURE) ×4 IMPLANT
SUT VIC AB 2-0 CTB1 (SUTURE) IMPLANT
TOWEL OR 17X24 6PK STRL BLUE (TOWEL DISPOSABLE) ×1 IMPLANT
TOWEL OR 17X26 10 PK STRL BLUE (TOWEL DISPOSABLE) ×2 IMPLANT
TUBE CONNECTING 12X1/4 (SUCTIONS) ×2 IMPLANT
WATER STERILE IRR 1000ML POUR (IV SOLUTION) ×2 IMPLANT
YANKAUER SUCT BULB TIP NO VENT (SUCTIONS) ×2 IMPLANT

## 2018-09-22 NOTE — Progress Notes (Signed)
Orthopedic Tech Progress Note Patient Details:  [name] 10/06/1955 [id number]  Ortho Devices Type of Ortho Device: Postop shoe/boot Ortho Device/Splint Location: right foot Ortho Device/Splint Interventions: Adjustment, Application, Ordered   Post Interventions Patient Tolerated: Well Instructions Provided: Care of device, Adjustment of device   Janit Pagan 09/22/2018, 6:06 PM

## 2018-09-22 NOTE — Anesthesia Postprocedure Evaluation (Signed)
Anesthesia Post Note  Patient: Nikolos Billig  Procedure(s) Performed: RIGHT TRANSMETATARSAL AMPUTATION (Right Foot)     Patient location during evaluation: PACU Anesthesia Type: General Level of consciousness: awake and alert Pain management: pain level controlled Vital Signs Assessment: post-procedure vital signs reviewed and stable Respiratory status: spontaneous breathing, nonlabored ventilation, respiratory function stable and patient connected to nasal cannula oxygen Cardiovascular status: blood pressure returned to baseline and stable Postop Assessment: no apparent nausea or vomiting Anesthetic complications: no    Last Vitals:  Vitals:   09/22/18 1435 09/22/18 1452  BP: 115/85 94/73  Pulse: 81 77  Resp: 20 11  Temp:    SpO2: 98% 99%    Last Pain:  Vitals:   09/22/18 1420  TempSrc:   PainSc: 5                  Davide Risdon
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
## 2018-09-22 NOTE — Anesthesia Preprocedure Evaluation (Addendum)
Anesthesia Evaluation  Patient identified by MRN, date of birth, ID band Patient awake    Reviewed: Allergy & Precautions, H&P , NPO status , Patient's Chart, lab work & pertinent test results, reviewed documented beta blocker date and time   Airway Mallampati: I  TM Distance: >3 FB Neck ROM: full    Dental no notable dental hx. (+) Edentulous Upper, Edentulous Lower   Pulmonary neg pulmonary ROS, former smoker,    Pulmonary exam normal breath sounds clear to auscultation       Cardiovascular Exercise Tolerance: Good + Peripheral Vascular Disease  Normal cardiovascular exam Rhythm:regular Rate:Normal  TTE 2013 - Left ventricle: The cavity size was normal. Systolic function was normal. The estimated ejection fraction was in the range of 55% to 60%. Wall motion was normal; there were no regional wall motion abnormalities. - Mitral valve: Mildly calcified annulus.   Neuro/Psych negative neurological ROS  negative psych ROS   GI/Hepatic negative GI ROS, Neg liver ROS,   Endo/Other  diabetes, Type 2, Oral Hypoglycemic Agents  Renal/GU negative Renal ROS  negative genitourinary   Musculoskeletal  (+) Arthritis , Osteoarthritis,    Abdominal   Peds  Hematology negative hematology ROS (+)   Anesthesia Other Findings   Reproductive/Obstetrics negative OB ROS                            Anesthesia Physical  Anesthesia Plan  ASA: III  Anesthesia Plan: General   Post-op Pain Management:    Induction: Intravenous  PONV Risk Score and Plan: 2 and Ondansetron, Midazolam and Treatment may vary due to age or medical condition  Airway Management Planned: LMA  Additional Equipment:   Intra-op Plan:   Post-operative Plan: Extubation in OR  Informed Consent: I have reviewed the patients History and Physical, chart, labs and discussed the procedure including the risks, benefits and  alternatives for the proposed anesthesia with the patient or authorized representative who has indicated his/her understanding and acceptance.     Dental Advisory Given  Plan Discussed with: CRNA, Anesthesiologist and Surgeon  Anesthesia Plan Comments:         Anesthesia Quick Evaluation

## 2018-09-22 NOTE — Op Note (Signed)
09/22/2018  2:05 PM  PATIENT:  [name]    PRE-OPERATIVE DIAGNOSIS:  Right 2nd and 3rd Toe Osteomyelitis  POST-OPERATIVE DIAGNOSIS:  Same  PROCEDURE:  RIGHT TRANSMETATARSAL AMPUTATION Application of Praveena wound VAC  SURGEON:  [name], MD  PHYSICIAN ASSISTANT: April Green ANESTHESIA:   General  PREOPERATIVE INDICATIONS:  [name] is a  63 y.o. male with a diagnosis of Right 2nd and 3rd Toe Osteomyelitis who failed conservative measures and elected for surgical management.    The risks benefits and alternatives were discussed with the patient preoperatively including but not limited to the risks of infection, bleeding, nerve injury, cardiopulmonary complications, the need for revision surgery, among others, and the patient was willing to proceed.  OPERATIVE IMPLANTS: Praveena wound VAC  @ENCIMAGES @  OPERATIVE FINDINGS: Calcified vessels no abscess good petechial bleeding  OPERATIVE PROCEDURE: Patient was brought the operating room underwent a general anesthetic.  After adequate levels anesthesia were obtained patient's right lower extremity was prepped using DuraPrep draped into a sterile field a timeout was called.  A fishmouth incision was made proximal to the gangrenous necrotic tissue a transmetatarsal amputation was performed with a oscillating saw the bones were beveled plantarly.  Electrocautery was used for hemostasis the wounds were irrigated with normal saline.  The incision was closed using 2-0 nylon a Praveena wound VAC was applied patient was taken the PACU in stable condition   DISCHARGE PLANNING:  Antibiotic duration: 24 hours  Weightbearing: Nonweightbearing on the right  Pain medication: High-dose opioid pathway  Dressing care/ Wound VAC: Keep wound VAC in place for 1 week  Ambulatory devices: Walker  Discharge to: Home  Follow-up: In the office 1 week post operative.

## 2018-09-22 NOTE — Anesthesia Procedure Notes (Signed)
Procedure Name: LMA Insertion Date/Time: 09/22/2018 1:29 PM Performed by: Barrington Ellison, CRNA Pre-anesthesia Checklist: Patient identified, Emergency Drugs available, Suction available and Patient being monitored Patient Re-evaluated:Patient Re-evaluated prior to induction Oxygen Delivery Method: Circle System Utilized Preoxygenation: Pre-oxygenation with 100% oxygen Induction Type: IV induction Ventilation: Mask ventilation without difficulty LMA: LMA inserted LMA Size: 5.0 Number of attempts: 1 Placement Confirmation: positive ETCO2 Tube secured with: Tape Dental Injury: Teeth and Oropharynx as per pre-operative assessment

## 2018-09-22 NOTE — Plan of Care (Signed)
  Problem: Pain Management: Goal: Pain level will decrease with appropriate interventions Outcome: Progressing   Problem: Activity: Goal: Ability to perform//tolerate increased activity and mobilize with assistive devices will improve Outcome: Progressing   Problem: Self-Care: Goal: Ability to meet self-care needs will improve Outcome: Progressing

## 2018-09-22 NOTE — Transfer of Care (Signed)
Immediate Anesthesia Transfer of Care Note  Patient: [name]  Procedure(s) Performed: RIGHT TRANSMETATARSAL AMPUTATION (Right Foot)  Patient Location: PACU  Anesthesia Type:General  Level of Consciousness: lethargic and responds to stimulation  Airway & Oxygen Therapy: Patient Spontanous Breathing  Post-op Assessment: Report given to RN  Post vital signs: Reviewed and stable  Last Vitals:  Vitals Value Taken Time  BP 109/75 09/22/2018  2:20 PM  Temp    Pulse 73 09/22/2018  2:21 PM  Resp 13 09/22/2018  2:21 PM  SpO2 96 % 09/22/2018  2:21 PM  Vitals shown include unvalidated device data.  Last Pain:  Vitals:   09/22/18 1057  TempSrc:   PainSc: 0-No pain         Complications: No apparent anesthesia complications

## 2018-09-23 ENCOUNTER — Encounter (HOSPITAL_COMMUNITY): Payer: Self-pay | Admitting: Orthopedic Surgery

## 2018-09-23 LAB — GLUCOSE, CAPILLARY: Glucose-Capillary: 119 mg/dL — ABNORMAL HIGH (ref 70–99)

## 2018-09-23 MED ORDER — OXYCODONE HCL 5 MG PO TABS: 5.0000 mg | ORAL_TABLET | ORAL | 0 refills | Status: DC | PRN

## 2018-09-23 NOTE — Discharge Instructions (Signed)
Non weight bearing on right foot and keep elevated as much as possible.   Keep the prevena VAC plugged into wall outlet as much as possible.

## 2018-09-23 NOTE — Discharge Summary (Signed)
Discharge Diagnoses:  Active Problems:   Gangrene of toe of right foot ([place])   Status post transmetatarsal amputation of foot, right ([place])   Surgeries: Procedure(s): RIGHT TRANSMETATARSAL AMPUTATION on 09/22/2018    Consultants:   Discharged Condition: Improved  Hospital Course: [name] is an 63 y.o. male who was admitted 09/22/2018 with a chief complaint of infection/gangrene of residual toes and forefoot, with a final diagnosis of Right 2nd and 3rd Toe Osteomyelitis.  Patient was brought to the operating room on 09/22/2018 and underwent Procedure(s): RIGHT TRANSMETATARSAL AMPUTATION.    Patient was given perioperative antibiotics:  Anti-infectives (From admission, onward)   Start     Dose/Rate Route Frequency Ordered Stop   09/22/18 1930  ceFAZolin (ANCEF) IVPB 1 g/50 mL premix     1 g 100 mL/hr over 30 Minutes Intravenous Every 6 hours 09/22/18 1710 09/23/18 0701   09/22/18 1130  ceFAZolin (ANCEF) 2-4 GM/100ML-% IVPB    Note to Pharmacy:  Grace Blight   : cabinet override      09/22/18 1130 09/22/18 1331   09/22/18 1015  ceFAZolin (ANCEF) IVPB 2g/100 mL premix     2 g 200 mL/hr over 30 Minutes Intravenous On call to O.R. 09/22/18 1014 09/22/18 1331    .  Patient was given sequential compression devices, early ambulation, and aspirin for DVT prophylaxis.  Recent vital signs:  Patient Vitals for the past 24 hrs:  BP Temp Temp src Pulse Resp SpO2 Height Weight  09/23/18 0321 103/77 97.8 F (36.6 C) Oral 73 18 99 % - -  09/22/18 2128 111/75 97.7 F (36.5 C) Oral 75 20 95 % - -  09/22/18 1714 111/77 (!) 97.5 F (36.4 C) Oral 73 - 100 % - -  09/22/18 1650 93/70 (!) 97 F (36.1 C) - 70 14 99 % - -  09/22/18 1635 104/75 - - 66 10 98 % - -  09/22/18 1620 119/75 - - 83 17 99 % - -  09/22/18 1605 93/71 - - 73 15 99 % - -  09/22/18 1550 101/77 - - 69 13 100 % - -  09/22/18 1535 102/76 - - 74 11 99 % - -  09/22/18 1521 99/79 - - 80 15 98 % - -  09/22/18 1506 102/75 - - 74  (!) 9 99 % - -  09/22/18 1500 - - - 73 12 100 % - -  09/22/18 1452 94/73 - - 77 11 99 % - -  09/22/18 1435 115/85 - - 81 20 98 % - -  09/22/18 1420 109/75 (!) 97 F (36.1 C) - 77 14 96 % - -  09/22/18 1032 104/79 (!) 97.4 F (36.3 C) Oral 79 20 99 % 5\' 8"  (1.727 m) 79.8 kg  .  Recent laboratory studies: No results found.  Discharge Medications:   Allergies as of 09/23/2018      Reactions   Bactrim Ds [sulfamethoxazole-trimethoprim] Nausea And Vomiting   Trental [pentoxifylline] Nausea And Vomiting   Adhesive [tape] Other (See Comments)   Causes blisters - pls use paper tape      Medication List    STOP taking these medications   pentoxifylline 400 MG CR tablet Commonly known as:  TRENTAL     TAKE these medications   acetaminophen 500 MG tablet Commonly known as:  TYLENOL Take 500 mg by mouth every 6 (six) hours as needed for moderate pain.   acetaminophen 325 MG tablet Commonly known as:  TYLENOL Take 2  tablets (650 mg total) by mouth every 6 (six) hours as needed for mild pain or headache.   aspirin 81 MG EC tablet Take 1 tablet (81 mg total) by mouth daily.   clopidogrel 75 MG tablet Commonly known as:  PLAVIX Take 1 tablet (75 mg total) by mouth daily with breakfast.   cyclobenzaprine 5 MG tablet Commonly known as:  FLEXERIL Take 1 tablet (5 mg total) by mouth 3 (three) times daily as needed for muscle spasms.   glipiZIDE 10 MG tablet Commonly known as:  GLUCOTROL Take 1 tablet (10 mg total) by mouth daily before breakfast.   nitroGLYCERIN 0.4 mg/hr patch Commonly known as:  NITRODUR - Dosed in mg/24 hr Place 1 patch (0.4 mg total) onto the skin daily.   oxyCODONE 5 MG immediate release tablet Commonly known as:  Oxy IR/ROXICODONE Take 1-2 tablets (5-10 mg total) by mouth every 4 (four) hours as needed for moderate pain or severe pain (pain score 4-6). What changed:    how much to take  when to take this  reasons to take this   pantoprazole 40 MG  tablet Commonly known as:  PROTONIX Take 1 tablet (40 mg total) by mouth daily.   rosuvastatin 10 MG tablet Commonly known as:  CRESTOR Take 1 tablet (10 mg total) by mouth daily at 6 PM.       Diagnostic Studies: No results found.  Patient benefited maximally from their hospital stay and there were no complications.     Disposition: Discharge disposition: 01-Home or Self Care      Discharge Instructions    Call MD / Call 911   Complete by:  As directed    If you experience chest pain or shortness of breath, CALL 911 and be transported to the hospital emergency room.  If you develope a fever above 101 F, pus (white drainage) or increased drainage or redness at the wound, or calf pain, call your surgeon's office.   Call MD / Call 911   Complete by:  As directed    If you experience chest pain or shortness of breath, CALL 911 and be transported to the hospital emergency room.  If you develope a fever above 101 F, pus (white drainage) or increased drainage or redness at the wound, or calf pain, call your surgeon's office.   Care order/instruction:   Complete by:  As directed    Discharge with Prevena VAC machine and instruct patient to keep plugged into wall outlet as much as possible to keep machine charged.  Instruct patient non weight bearing on foot and keep elevated.   Constipation Prevention   Complete by:  As directed    Drink plenty of fluids.  Prune juice may be helpful.  You may use a stool softener, such as Colace (over the counter) 100 mg twice a day.  Use MiraLax (over the counter) for constipation as needed.   Constipation Prevention   Complete by:  As directed    Drink plenty of fluids.  Prune juice may be helpful.  You may use a stool softener, such as Colace (over the counter) 100 mg twice a day.  Use MiraLax (over the counter) for constipation as needed.   Diet - low sodium heart healthy   Complete by:  As directed    Diabetic diet   Diet - low sodium heart  healthy   Complete by:  As directed    Increase activity slowly as tolerated   Complete by:  As directed    Non weight bearing on the foot   Increase activity slowly as tolerated   Complete by:  As directed      Follow-up Information    [name], MD In 1 week.   Specialty:  Orthopedic Surgery Contact information: [place] [address] [phone]            Signed: Ulysees Barns 09/23/2018, 9:13 AM [employer] [phone]

## 2018-09-23 NOTE — Evaluation (Signed)
Physical Therapy Evaluation Patient Details Name: [name] MRN: [id number] DOB: [birth year] Today's Date: 09/23/2018   History of Present Illness  Patient is a 63 y/o male s/p RIGHT TRANSMETATARSAL AMPUTATION on 09/22/2018. PMH significant of DM.    Clinical Impression  Patient admitted s/p above listed procedure. Patient reports Mod I with mobility and ADLs prior to admission. Patient now NWB on R LE with required use of RW for mobility. Patient performing all mobility with general supervision for safety. Patient able to maintain NWB throughout transfers and gait with no LOB or overt instability. PT to follow acutely.     Follow Up Recommendations No PT follow up    Equipment Recommendations  None recommended by PT    Recommendations for Other Services       Precautions / Restrictions Precautions Precautions: Fall Required Braces or Orthoses: Other Brace Other Brace: post-op shoe Restrictions Weight Bearing Restrictions: Yes RLE Weight Bearing: Non weight bearing      Mobility  Bed Mobility Overal bed mobility: Modified Independent                Transfers Overall transfer level: Needs assistance Equipment used: Rolling walker (2 wheeled) Transfers: Sit to/from [facility] Sit to Stand: Supervision Stand pivot transfers: Supervision       General transfer comment: SUP for safety - no LOB  Ambulation/Gait Ambulation/Gait assistance: Min guard;Supervision Gait Distance (Feet): 25 Feet Assistive device: Rolling walker (2 wheeled) Gait Pattern/deviations: (hop to pattern)     General Gait Details: hop to pattern to maintain NWB - good stability with use of RW; no LOB  Stairs            Wheelchair Mobility    Modified Rankin (Stroke Patients Only)       Balance Overall balance assessment: Mild deficits observed, not formally tested                                           Pertinent Vitals/Pain Pain  Assessment: Faces Faces Pain Scale: Hurts a little bit Pain Location: R foot Pain Descriptors / Indicators: Aching;Discomfort;Guarding Pain Intervention(s): Limited activity within patient's tolerance;Monitored during session    Home Living Family/patient expects to be discharged to:: Private residence Living Arrangements: Spouse/significant other Available Help at Discharge: Family;Available 24 hours/day Type of Home: House Home Access: Level entry     Home Layout: One level Home Equipment: Walker - 2 wheels;Bedside commode      Prior Function Level of Independence: Independent with assistive device(s)         Comments: use of RW for mobility recently     Hand Dominance   Dominant Hand: Right    Extremity/Trunk Assessment   Upper Extremity Assessment Upper Extremity Assessment: Overall WFL for tasks assessed    Lower Extremity Assessment Lower Extremity Assessment: Generalized weakness;RLE deficits/detail RLE Deficits / Details: expected post-op pain/weakness; NWB    Cervical / Trunk Assessment Cervical / Trunk Assessment: Normal  Communication   Communication: No difficulties  Cognition Arousal/Alertness: Awake/alert Behavior During Therapy: WFL for tasks assessed/performed Overall Cognitive Status: Within Functional Limits for tasks assessed                                        General Comments General comments (skin integrity, edema, etc.):  patient very motivated to return home    Exercises     Assessment/Plan    PT Assessment Patient needs continued PT services  PT Problem List Decreased strength;Decreased activity tolerance;Decreased balance;Decreased mobility;Decreased knowledge of use of DME;Decreased safety awareness       PT Treatment Interventions DME instruction;Gait training;Stair training;Functional mobility training;Therapeutic exercise;Therapeutic activities;Balance training;Patient/family education    PT Goals  (Current goals can be found in the Care Plan section)  Acute Rehab PT Goals Patient Stated Goal: return home today PT Goal Formulation: With patient Time For Goal Achievement: 09/30/18 Potential to Achieve Goals: Good    Frequency Min 3X/week   Barriers to discharge        Co-evaluation               AM-PAC PT "6 Clicks" Mobility  Outcome Measure Help needed turning from your back to your side while in a flat bed without using bedrails?: None Help needed moving from lying on your back to sitting on the side of a flat bed without using bedrails?: None Help needed moving to and from a bed to a chair (including a wheelchair)?: A Little Help needed standing up from a chair using your arms (e.g., wheelchair or bedside chair)?: A Little Help needed to walk in hospital room?: A Little Help needed climbing 3-5 steps with a railing? : A Lot 6 Click Score: 19    End of Session Equipment Utilized During Treatment: Gait belt Activity Tolerance: Patient tolerated treatment well Patient left: in bed;with call bell/phone within reach Nurse Communication: Mobility status PT Visit Diagnosis: Unsteadiness on feet (R26.81);Other abnormalities of gait and mobility (R26.89);Muscle weakness (generalized) (M62.81)    Time: 9604-5409 PT Time Calculation (min) (ACUTE ONLY): 21 min   Charges:   PT Evaluation $PT Eval Moderate Complexity: 1 Mod          Lanney Gins, PT, DPT Supplemental Physical Therapist 09/23/18 8:45 AM Pager: [phone] Office: [phone]

## 2018-09-23 NOTE — Plan of Care (Signed)
  Problem: Education: Goal: Knowledge of the prescribed therapeutic regimen will improve Outcome: Adequate for Discharge Goal: Ability to verbalize activity precautions or restrictions will improve Outcome: Adequate for Discharge Goal: Understanding of discharge needs will improve Outcome: Adequate for Discharge   Problem: Activity: Goal: Ability to perform//tolerate increased activity and mobilize with assistive devices will improve Outcome: Adequate for Discharge   Problem: Clinical Measurements: Goal: Postoperative complications will be avoided or minimized Outcome: Adequate for Discharge   Problem: Self-Care: Goal: Ability to meet self-care needs will improve Outcome: Adequate for Discharge   Problem: Self-Concept: Goal: Ability to maintain and perform role responsibilities to the fullest extent possible will improve Outcome: Adequate for Discharge   Problem: Pain Management: Goal: Pain level will decrease with appropriate interventions Outcome: Adequate for Discharge Goal: Ability to express needs and understand communication Outcome: Adequate for Discharge Goal: Ability to express needs and understand communication Outcome: Adequate for Discharge   Problem: Skin Integrity: Goal: Demonstration of wound healing without infection will improve Outcome: Adequate for Discharge Goal: Discuss wound/incision care Description Wound vac seal  remains intact.  Outcome: Adequate for Discharge   Problem: Education: Goal: Knowledge of General Education information will improve Description Including pain rating scale, medication(s)/side effects and non-pharmacologic comfort measures Outcome: Adequate for Discharge   Problem: Health Behavior/Discharge Planning: Goal: Ability to manage health-related needs will improve Outcome: Adequate for Discharge   Problem: Clinical Measurements: Goal: Ability to maintain clinical measurements within normal limits will improve Outcome:  Adequate for Discharge Goal: Will remain free from infection Outcome: Adequate for Discharge Goal: Diagnostic test results will improve Outcome: Adequate for Discharge Goal: Respiratory complications will improve Outcome: Adequate for Discharge Goal: Cardiovascular complication will be avoided Outcome: Adequate for Discharge   Problem: Activity: Goal: Risk for activity intolerance will decrease Outcome: Adequate for Discharge   Problem: Nutrition: Goal: Adequate nutrition will be maintained Outcome: Adequate for Discharge   Problem: Coping: Goal: Level of anxiety will decrease Outcome: Adequate for Discharge   Problem: Elimination: Goal: Will not experience complications related to bowel motility Outcome: Adequate for Discharge Goal: Will not experience complications related to urinary retention Outcome: Adequate for Discharge   Problem: Pain Managment: Goal: General experience of comfort will improve Outcome: Adequate for Discharge   Problem: Safety: Goal: Ability to remain free from injury will improve Outcome: Adequate for Discharge Goal: Ability to function at adequate level Outcome: Adequate for Discharge   Problem: Skin Integrity: Goal: Risk for impaired skin integrity will decrease Outcome: Adequate for Discharge

## 2018-09-23 NOTE — Progress Notes (Signed)
Pt AAOX4, on RA pox sat 96%. IVF's infusing without difficulty. Pt s/p R transmetatarsal amputation on 09/22/2018. Wound vac in place no drainage noted. Pt is currently NWB. Pain rated 9/10, prn pain med given, f/u pain rated 2/10. Voiding clear amber urine. Call light and phone in reach. Rounds continued per unit policy and MD orders.

## 2018-09-29 ENCOUNTER — Inpatient Hospital Stay (INDEPENDENT_AMBULATORY_CARE_PROVIDER_SITE_OTHER): Payer: 59 | Admitting: Orthopedic Surgery

## 2018-09-29 ENCOUNTER — Ambulatory Visit (INDEPENDENT_AMBULATORY_CARE_PROVIDER_SITE_OTHER): Payer: 59 | Admitting: Family

## 2018-09-29 ENCOUNTER — Encounter (INDEPENDENT_AMBULATORY_CARE_PROVIDER_SITE_OTHER): Payer: Self-pay | Admitting: Family

## 2018-09-29 VITALS — Ht 68.0 in | Wt 176.0 lb

## 2018-09-29 DIAGNOSIS — Z89431 Acquired absence of right foot: Secondary | ICD-10-CM

## 2018-09-29 MED ORDER — HYDROCODONE-ACETAMINOPHEN 5-325 MG PO TABS: 1.0000 | ORAL_TABLET | Freq: Four times a day (QID) | ORAL | 0 refills | Status: DC | PRN

## 2018-09-29 NOTE — Addendum Note (Signed)
Addended by: Suzan Slick on: 09/29/2018 02:13 PM   Modules accepted: Orders
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
## 2018-10-13 ENCOUNTER — Encounter (INDEPENDENT_AMBULATORY_CARE_PROVIDER_SITE_OTHER): Payer: Self-pay | Admitting: Family

## 2018-10-13 ENCOUNTER — Other Ambulatory Visit: Payer: Self-pay

## 2018-10-13 ENCOUNTER — Ambulatory Visit (INDEPENDENT_AMBULATORY_CARE_PROVIDER_SITE_OTHER): Payer: 59 | Admitting: Family

## 2018-10-13 VITALS — Ht 68.0 in | Wt 176.0 lb

## 2018-10-13 DIAGNOSIS — Z89431 Acquired absence of right foot: Secondary | ICD-10-CM
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
## 2018-10-20 ENCOUNTER — Ambulatory Visit (INDEPENDENT_AMBULATORY_CARE_PROVIDER_SITE_OTHER): Payer: 59 | Admitting: Family

## 2018-10-20 ENCOUNTER — Encounter (INDEPENDENT_AMBULATORY_CARE_PROVIDER_SITE_OTHER): Payer: Self-pay | Admitting: Family

## 2018-10-20 ENCOUNTER — Other Ambulatory Visit: Payer: Self-pay

## 2018-10-20 VITALS — Ht 68.0 in | Wt 176.0 lb

## 2018-10-20 DIAGNOSIS — Z89431 Acquired absence of right foot: Secondary | ICD-10-CM
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
## 2018-11-04 ENCOUNTER — Telehealth (INDEPENDENT_AMBULATORY_CARE_PROVIDER_SITE_OTHER): Payer: Self-pay | Admitting: Radiology

## 2018-11-04 ENCOUNTER — Ambulatory Visit (INDEPENDENT_AMBULATORY_CARE_PROVIDER_SITE_OTHER): Payer: 59 | Admitting: Orthopedic Surgery

## 2018-11-04 NOTE — Telephone Encounter (Signed)
Called and left voicemail asking patient to call us back to answer pre screening questions for appointment on 4/6

## 2018-11-08 ENCOUNTER — Encounter (INDEPENDENT_AMBULATORY_CARE_PROVIDER_SITE_OTHER): Payer: Self-pay | Admitting: Orthopedic Surgery

## 2018-11-08 ENCOUNTER — Ambulatory Visit (INDEPENDENT_AMBULATORY_CARE_PROVIDER_SITE_OTHER): Payer: 59 | Admitting: Orthopedic Surgery

## 2018-11-08 ENCOUNTER — Other Ambulatory Visit: Payer: Self-pay

## 2018-11-08 VITALS — Ht 68.0 in | Wt 176.0 lb

## 2018-11-08 DIAGNOSIS — E43 Unspecified severe protein-calorie malnutrition: Secondary | ICD-10-CM

## 2018-11-08 DIAGNOSIS — Z89431 Acquired absence of right foot: Secondary | ICD-10-CM

## 2018-11-08 DIAGNOSIS — M86271 Subacute osteomyelitis, right ankle and foot: Secondary | ICD-10-CM
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
## 2018-11-09 ENCOUNTER — Ambulatory Visit (INDEPENDENT_AMBULATORY_CARE_PROVIDER_SITE_OTHER): Payer: Self-pay | Admitting: Physician Assistant

## 2018-11-10 ENCOUNTER — Encounter (HOSPITAL_COMMUNITY): Payer: Self-pay | Admitting: *Deleted

## 2018-11-10 ENCOUNTER — Other Ambulatory Visit: Payer: Self-pay

## 2018-11-10 NOTE — Progress Notes (Signed)
Pt denies SOB, chest pain, and being under the care of a cardiologist. Pt denies having a stress test and cardiac cath. Pt to call MD for pre-op Aspirin instructions. Pt made aware to stop taking vitamins, fish oil and herbal medications. Do not take any NSAIDs ie: Ibuprofen, Advil, Naproxen (Aleve), Motrin, BC and Goody Powder. Pt denies having a chest x ray within the last year. Pt denies having an A1c and recent labs.   Coronavirus Screening  Pt denies that he and his spouse experienced the following symptoms:  Cough yes/no: No Fever (>100.59F)  yes/no: No Runny nose yes/no: No Sore throat yes/no: No Difficulty breathing/shortness of breath  yes/no: No  Have you or a family member traveled in the last 14 days and where? yes/no: Yes -[place] ( both pt and spouse from 3/21-  " for a week" )  Pt reminded that hospital visitation restrictions are in effect and the importance of the restrictions.  Pt verbalized understanding of all pre-op instructions.

## 2018-11-11 ENCOUNTER — Ambulatory Visit (INDEPENDENT_AMBULATORY_CARE_PROVIDER_SITE_OTHER): Payer: Self-pay | Admitting: Physician Assistant

## 2018-11-11 MED ORDER — CEFAZOLIN SODIUM-DEXTROSE 2-4 GM/100ML-% IV SOLN: 2.0000 g | INTRAVENOUS | Status: DC

## 2018-11-11 MED ORDER — CHLORHEXIDINE GLUCONATE 4 % EX LIQD: 60.0000 mL | Freq: Once | CUTANEOUS | Status: DC

## 2018-11-11 NOTE — Progress Notes (Addendum)
I spoke with Mr Fortson.  He traveled with his wife and 65 year old mother to [place], [place], March 21- 30th. He traveled to see his son that has CP.  Mr Volner denies that he nor anyone he visited or travled with, at that time or since have show any of the following signs: Cough Fever >100.4 Runny Nose Sore Throat Difficulty breathing/ shortness of breath I called Health at work and spoke with Clifton James, who said that patient will need to wear a mask.  I will forward information to check in desk.

## 2018-11-12 ENCOUNTER — Encounter (HOSPITAL_COMMUNITY)
Admission: RE | Disposition: A | Discharge status: Home / Self Care | Payer: Self-pay | Source: Home / Self Care | Attending: Orthopedic Surgery

## 2018-11-12 ENCOUNTER — Other Ambulatory Visit: Payer: Self-pay

## 2018-11-12 ENCOUNTER — Inpatient Hospital Stay (HOSPITAL_COMMUNITY): Payer: 59 | Admitting: Anesthesiology

## 2018-11-12 ENCOUNTER — Observation Stay (HOSPITAL_COMMUNITY)
Admission: RE | Admit: 2018-11-12 | Discharge: 2018-11-13 | Disposition: A | Discharge status: Home / Self Care | Payer: 59 | Attending: Orthopedic Surgery | Admitting: Orthopedic Surgery

## 2018-11-12 DIAGNOSIS — Z89431 Acquired absence of right foot: Secondary | ICD-10-CM

## 2018-11-12 DIAGNOSIS — E1169 Type 2 diabetes mellitus with other specified complication: Secondary | ICD-10-CM | POA: Diagnosis present

## 2018-11-12 DIAGNOSIS — E11621 Type 2 diabetes mellitus with foot ulcer: Secondary | ICD-10-CM | POA: Diagnosis not present

## 2018-11-12 DIAGNOSIS — Y835 Amputation of limb(s) as the cause of abnormal reaction of the patient, or of later complication, without mention of misadventure at the time of the procedure: Secondary | ICD-10-CM | POA: Diagnosis not present

## 2018-11-12 DIAGNOSIS — Z85828 Personal history of other malignant neoplasm of skin: Secondary | ICD-10-CM | POA: Diagnosis not present

## 2018-11-12 DIAGNOSIS — T8781 Dehiscence of amputation stump: Secondary | ICD-10-CM

## 2018-11-12 DIAGNOSIS — Z79899 Other long term (current) drug therapy: Secondary | ICD-10-CM | POA: Diagnosis not present

## 2018-11-12 DIAGNOSIS — M1990 Unspecified osteoarthritis, unspecified site: Secondary | ICD-10-CM | POA: Diagnosis not present

## 2018-11-12 DIAGNOSIS — Z87891 Personal history of nicotine dependence: Secondary | ICD-10-CM | POA: Diagnosis not present

## 2018-11-12 DIAGNOSIS — Z888 Allergy status to other drugs, medicaments and biological substances status: Secondary | ICD-10-CM | POA: Insufficient documentation

## 2018-11-12 DIAGNOSIS — M869 Osteomyelitis, unspecified: Secondary | ICD-10-CM | POA: Insufficient documentation

## 2018-11-12 DIAGNOSIS — L97516 Non-pressure chronic ulcer of other part of right foot with bone involvement without evidence of necrosis: Secondary | ICD-10-CM | POA: Insufficient documentation

## 2018-11-12 DIAGNOSIS — Z881 Allergy status to other antibiotic agents status: Secondary | ICD-10-CM | POA: Insufficient documentation

## 2018-11-12 HISTORY — PX: STUMP REVISION: SHX6102

## 2018-11-12 HISTORY — PX: APPLICATION OF WOUND VAC: SHX5189

## 2018-11-12 HISTORY — DX: Dehiscence of amputation stump: T87.81

## 2018-11-12 LAB — POCT I-STAT 4, (NA,K, GLUC, HGB,HCT)
Glucose, Bld: 178 mg/dL — ABNORMAL HIGH (ref 70–99)
HCT: 40 % (ref 39.0–52.0)
Hemoglobin: 13.6 g/dL (ref 13.0–17.0)
Potassium: 4.1 mmol/L (ref 3.5–5.1)
Sodium: 139 mmol/L (ref 135–145)

## 2018-11-12 LAB — GLUCOSE, CAPILLARY
Glucose-Capillary: 180 mg/dL — ABNORMAL HIGH (ref 70–99)
Glucose-Capillary: 181 mg/dL — ABNORMAL HIGH (ref 70–99)
Glucose-Capillary: 127 mg/dL — ABNORMAL HIGH (ref 70–99)

## 2018-11-12 SURGERY — REVISION, AMPUTATION SITE
Anesthesia: Monitor Anesthesia Care | Site: Foot | Laterality: Right

## 2018-11-12 MED ORDER — MIDAZOLAM HCL 2 MG/2ML IJ SOLN: 0.5000 mg | Freq: Once | INTRAMUSCULAR | Status: DC | PRN

## 2018-11-12 MED ORDER — ASPIRIN EC 81 MG PO TBEC
81.0000 mg | DELAYED_RELEASE_TABLET | Freq: Every day | ORAL | Status: DC
  Administered 2018-11-13: 81 mg via ORAL
  Filled 2018-11-12: qty 1

## 2018-11-12 MED ORDER — PROPOFOL 500 MG/50ML IV EMUL
INTRAVENOUS | Status: DC | PRN
  Administered 2018-11-12: 10 ug/kg/min via INTRAVENOUS

## 2018-11-12 MED ORDER — HYDROMORPHONE HCL 1 MG/ML IJ SOLN
0.5000 mg | INTRAMUSCULAR | Status: DC | PRN
  Administered 2018-11-12: 0.5 mg via INTRAVENOUS
  Administered 2018-11-13: 1 mg via INTRAVENOUS
  Filled 2018-11-12 (×2): qty 1

## 2018-11-12 MED ORDER — INSULIN ASPART 100 UNIT/ML ~~LOC~~ SOLN: 0.0000 [IU] | Freq: Three times a day (TID) | SUBCUTANEOUS | Status: DC

## 2018-11-12 MED ORDER — FENTANYL CITRATE (PF) 250 MCG/5ML IJ SOLN
INTRAMUSCULAR | Status: AC
  Filled 2018-11-12: qty 5

## 2018-11-12 MED ORDER — ONDANSETRON HCL 4 MG/2ML IJ SOLN
INTRAMUSCULAR | Status: DC | PRN
  Administered 2018-11-12: 4 mg via INTRAVENOUS

## 2018-11-12 MED ORDER — ACETAMINOPHEN 500 MG PO TABS
1000.0000 mg | ORAL_TABLET | Freq: Four times a day (QID) | ORAL | Status: AC
  Administered 2018-11-12 – 2018-11-13 (×3): 1000 mg via ORAL
  Filled 2018-11-12 (×3): qty 2

## 2018-11-12 MED ORDER — PROMETHAZINE HCL 25 MG/ML IJ SOLN: 6.2500 mg | INTRAMUSCULAR | Status: DC | PRN

## 2018-11-12 MED ORDER — ONDANSETRON HCL 4 MG/2ML IJ SOLN
4.0000 mg | Freq: Four times a day (QID) | INTRAMUSCULAR | Status: DC | PRN
  Filled 2018-11-12: qty 2

## 2018-11-12 MED ORDER — CHLORHEXIDINE GLUCONATE 4 % EX LIQD: 60.0000 mL | Freq: Once | CUTANEOUS | Status: DC

## 2018-11-12 MED ORDER — ROPIVACAINE HCL 5 MG/ML IJ SOLN
INTRAMUSCULAR | Status: DC | PRN
  Administered 2018-11-12: 40 mL via PERINEURAL

## 2018-11-12 MED ORDER — CEFAZOLIN SODIUM-DEXTROSE 2-4 GM/100ML-% IV SOLN
INTRAVENOUS | Status: AC
  Filled 2018-11-12: qty 100

## 2018-11-12 MED ORDER — METOCLOPRAMIDE HCL 5 MG PO TABS: 5.0000 mg | ORAL_TABLET | Freq: Three times a day (TID) | ORAL | Status: DC | PRN

## 2018-11-12 MED ORDER — METOCLOPRAMIDE HCL 5 MG/ML IJ SOLN: 5.0000 mg | Freq: Three times a day (TID) | INTRAMUSCULAR | Status: DC | PRN

## 2018-11-12 MED ORDER — DOCUSATE SODIUM 100 MG PO CAPS
100.0000 mg | ORAL_CAPSULE | Freq: Two times a day (BID) | ORAL | Status: DC
  Filled 2018-11-12 (×2): qty 1

## 2018-11-12 MED ORDER — HYDROMORPHONE HCL 1 MG/ML IJ SOLN: 0.2500 mg | INTRAMUSCULAR | Status: DC | PRN

## 2018-11-12 MED ORDER — GLIPIZIDE 10 MG PO TABS: 10.0000 mg | ORAL_TABLET | Freq: Every day | ORAL | 1 refills | Status: DC

## 2018-11-12 MED ORDER — FENTANYL CITRATE (PF) 100 MCG/2ML IJ SOLN
INTRAMUSCULAR | Status: AC
  Administered 2018-11-12: 100 ug
  Filled 2018-11-12: qty 2

## 2018-11-12 MED ORDER — MIDAZOLAM HCL 2 MG/2ML IJ SOLN
INTRAMUSCULAR | Status: AC
  Filled 2018-11-12: qty 2

## 2018-11-12 MED ORDER — ACETAMINOPHEN 325 MG PO TABS: 325.0000 mg | ORAL_TABLET | Freq: Four times a day (QID) | ORAL | Status: DC | PRN

## 2018-11-12 MED ORDER — CEFAZOLIN SODIUM-DEXTROSE 2-4 GM/100ML-% IV SOLN: 2.0000 g | INTRAVENOUS | Status: DC

## 2018-11-12 MED ORDER — 0.9 % SODIUM CHLORIDE (POUR BTL) OPTIME
TOPICAL | Status: DC | PRN
  Administered 2018-11-12: 1000 mL

## 2018-11-12 MED ORDER — ROSUVASTATIN CALCIUM 5 MG PO TABS
10.0000 mg | ORAL_TABLET | Freq: Every day | ORAL | Status: DC
  Administered 2018-11-12: 10 mg via ORAL
  Filled 2018-11-12: qty 2

## 2018-11-12 MED ORDER — ROSUVASTATIN CALCIUM 10 MG PO TABS: 10.0000 mg | ORAL_TABLET | Freq: Every day | ORAL | 1 refills | Status: DC

## 2018-11-12 MED ORDER — OXYCODONE HCL 5 MG PO TABS
10.0000 mg | ORAL_TABLET | ORAL | Status: DC | PRN
  Administered 2018-11-12: 15 mg via ORAL
  Administered 2018-11-12: 10 mg via ORAL
  Administered 2018-11-13 (×2): 15 mg via ORAL
  Filled 2018-11-12 (×3): qty 3

## 2018-11-12 MED ORDER — MEPERIDINE HCL 50 MG/ML IJ SOLN: 6.2500 mg | INTRAMUSCULAR | Status: DC | PRN

## 2018-11-12 MED ORDER — SODIUM CHLORIDE 0.9 % IV SOLN: INTRAVENOUS | Status: DC | PRN

## 2018-11-12 MED ORDER — ONDANSETRON HCL 4 MG PO TABS: 4.0000 mg | ORAL_TABLET | Freq: Four times a day (QID) | ORAL | Status: DC | PRN

## 2018-11-12 MED ORDER — MIDAZOLAM HCL 2 MG/2ML IJ SOLN
INTRAMUSCULAR | Status: AC
  Administered 2018-11-12: 2 mg
  Filled 2018-11-12: qty 2

## 2018-11-12 MED ORDER — LACTATED RINGERS IV SOLN
INTRAVENOUS | Status: DC
  Administered 2018-11-12: 12:00:00 via INTRAVENOUS

## 2018-11-12 MED ORDER — HYDROCODONE-ACETAMINOPHEN 5-325 MG PO TABS: 1.0000 | ORAL_TABLET | Freq: Four times a day (QID) | ORAL | 0 refills | Status: DC | PRN

## 2018-11-12 MED ORDER — PROPOFOL 10 MG/ML IV BOLUS
INTRAVENOUS | Status: DC | PRN
  Administered 2018-11-12: 30 mg via INTRAVENOUS

## 2018-11-12 MED ORDER — OXYCODONE HCL 5 MG PO TABS
5.0000 mg | ORAL_TABLET | ORAL | Status: DC | PRN
  Administered 2018-11-13: 10 mg via ORAL
  Filled 2018-11-12 (×2): qty 2

## 2018-11-12 SURGICAL SUPPLY — 31 items
BLADE SAW RECIP 87.9 MT (BLADE) IMPLANT
BLADE SURG 21 STRL SS (BLADE) ×3 IMPLANT
CANISTER WOUND CARE 500ML ATS (WOUND CARE) ×3 IMPLANT
COVER SURGICAL LIGHT HANDLE (MISCELLANEOUS) ×3 IMPLANT
COVER WAND RF STERILE (DRAPES) ×3 IMPLANT
DRAPE EXTREMITY T 121X128X90 (DISPOSABLE) ×3 IMPLANT
DRAPE HALF SHEET 40X57 (DRAPES) ×3 IMPLANT
DRAPE INCISE IOBAN 66X45 STRL (DRAPES) ×3 IMPLANT
DRAPE U-SHAPE 47X51 STRL (DRAPES) ×6 IMPLANT
DRESSING PREVENA PLUS CUSTOM (GAUZE/BANDAGES/DRESSINGS) ×1 IMPLANT
DRSG PREVENA PLUS CUSTOM (GAUZE/BANDAGES/DRESSINGS) ×6
DURAPREP 26ML APPLICATOR (WOUND CARE) ×3 IMPLANT
ELECT REM PT RETURN 9FT ADLT (ELECTROSURGICAL) ×3
ELECTRODE REM PT RTRN 9FT ADLT (ELECTROSURGICAL) ×1 IMPLANT
GLOVE BIOGEL PI IND STRL 9 (GLOVE) ×1 IMPLANT
GLOVE BIOGEL PI INDICATOR 9 (GLOVE) ×2
GLOVE SURG ORTHO 9.0 STRL STRW (GLOVE) ×3 IMPLANT
GOWN STRL REUS W/ TWL XL LVL3 (GOWN DISPOSABLE) ×2 IMPLANT
GOWN STRL REUS W/TWL XL LVL3 (GOWN DISPOSABLE) ×6
KIT BASIN OR (CUSTOM PROCEDURE TRAY) ×3 IMPLANT
KIT TURNOVER KIT B (KITS) ×3 IMPLANT
MANIFOLD NEPTUNE II (INSTRUMENTS) ×3 IMPLANT
NS IRRIG 1000ML POUR BTL (IV SOLUTION) ×3 IMPLANT
PACK GENERAL/GYN (CUSTOM PROCEDURE TRAY) ×3 IMPLANT
PAD ARMBOARD 7.5X6 YLW CONV (MISCELLANEOUS) ×3 IMPLANT
PREVENA RESTOR ARTHOFORM 46X30 (CANNISTER) ×1 IMPLANT
STAPLER VISISTAT 35W (STAPLE) IMPLANT
SUT ETHILON 2 0 PSLX (SUTURE) ×6 IMPLANT
SUT SILK 2 0 (SUTURE)
SUT SILK 2-0 18XBRD TIE 12 (SUTURE) IMPLANT
TOWEL OR 17X26 10 PK STRL BLUE (TOWEL DISPOSABLE) ×3 IMPLANT

## 2018-11-12 NOTE — Discharge Summary (Signed)
Discharge Diagnoses:  Active Problems:   Dehiscence of amputation stump ([place])   Status post transmetatarsal amputation of right foot ([place])   Surgeries: Procedure(s): REVISION RIGHT TRANSMETATARSAL AMPUTATION Application Of Wound Vac on 11/12/2018    Consultants:   Discharged Condition: Improved  Hospital Course: TEDD COTTRILL is an 63 y.o. male who was admitted 11/12/2018 with a chief complaint of infection/osteomyelitis right foot, with a final diagnosis of Osteomyelitis Right Foot.  Patient was brought to the operating room on 11/12/2018 and underwent Procedure(s): REVISION RIGHT TRANSMETATARSAL AMPUTATION Application Of Wound Vac.    Patient was given perioperative antibiotics:  Anti-infectives (From admission, onward)   Start     Dose/Rate Route Frequency Ordered Stop   11/12/18 1100  ceFAZolin (ANCEF) IVPB 2g/100 mL premix     2 g 200 mL/hr over 30 Minutes Intravenous On call to O.R. 11/12/18 1053 11/13/18 0559   11/12/18 1047  ceFAZolin (ANCEF) 2-4 GM/100ML-% IVPB    Note to Pharmacy:  Merryl Hacker   : cabinet override      11/12/18 1047 11/12/18 2259   11/12/18 0600  ceFAZolin (ANCEF) IVPB 2g/100 mL premix  Status:  Discontinued     2 g 200 mL/hr over 30 Minutes Intravenous On call to O.R. 11/11/18 1020 11/11/18 1021    .  Patient was given sequential compression devices, early ambulation, and aspirin for DVT prophylaxis.  Recent vital signs:  Patient Vitals for the past 24 hrs:  BP Temp Temp src Pulse Resp SpO2  11/12/18 1247 113/79 - - 66 12 94 %  11/12/18 1232 107/78 (!) 97.3 F (36.3 C) - 70 15 96 %  11/12/18 1135 131/79 (!) 95.6 F (35.3 C) Oral 73 - 98 %  .  Recent laboratory studies: No results found.  Discharge Medications:   Allergies as of 11/12/2018      Reactions   Adhesive [tape] Other (See Comments)   Causes blisters - pls use paper tape   Bactrim Ds [sulfamethoxazole-trimethoprim] Nausea And Vomiting   Trental [pentoxifylline] Nausea And  Vomiting      Medication List    STOP taking these medications   nitroGLYCERIN 0.4 mg/hr patch Commonly known as:  NITRODUR - Dosed in mg/24 hr   oxyCODONE 5 MG immediate release tablet Commonly known as:  Oxy IR/ROXICODONE   pantoprazole 40 MG tablet Commonly known as:  PROTONIX     TAKE these medications   acetaminophen 500 MG tablet Commonly known as:  TYLENOL Take 500 mg by mouth every 6 (six) hours as needed for moderate pain.   acetaminophen 325 MG tablet Commonly known as:  TYLENOL Take 2 tablets (650 mg total) by mouth every 6 (six) hours as needed for mild pain or headache.   aspirin 81 MG EC tablet Take 1 tablet (81 mg total) by mouth daily.   clopidogrel 75 MG tablet Commonly known as:  PLAVIX Take 1 tablet (75 mg total) by mouth daily with breakfast.   glipiZIDE 10 MG tablet Commonly known as:  GLUCOTROL Take 1 tablet (10 mg total) by mouth daily before breakfast. What changed:  Another medication with the same name was added. Make sure you understand how and when to take each.   glipiZIDE 10 MG tablet Commonly known as:  Glucotrol Take 1 tablet (10 mg total) by mouth daily before breakfast. What changed:  You were already taking a medication with the same name, and this prescription was added. Make sure you understand how and when to take  each.   HYDROcodone-acetaminophen 5-325 MG tablet Commonly known as:  NORCO/VICODIN Take 1 tablet by mouth every 6 (six) hours as needed for moderate pain. What changed:  Another medication with the same name was added. Make sure you understand how and when to take each.   HYDROcodone-acetaminophen 5-325 MG tablet Commonly known as:  Norco Take 1 tablet by mouth every 6 (six) hours as needed for moderate pain. What changed:  You were already taking a medication with the same name, and this prescription was added. Make sure you understand how and when to take each.   rosuvastatin 10 MG tablet Commonly known as:   CRESTOR Take 1 tablet (10 mg total) by mouth daily at 6 PM. What changed:  Another medication with the same name was added. Make sure you understand how and when to take each.   rosuvastatin 10 MG tablet Commonly known as:  Crestor Take 1 tablet (10 mg total) by mouth at bedtime. What changed:  You were already taking a medication with the same name, and this prescription was added. Make sure you understand how and when to take each.       Diagnostic Studies: No results found.  Patient benefited maximally from their hospital stay and there were no complications.     Disposition: Discharge disposition: 01-Home or Self Care      Discharge Instructions    Call MD / Call 911   Complete by:  As directed    If you experience chest pain or shortness of breath, CALL 911 and be transported to the hospital emergency room.  If you develope a fever above 101 F, pus (white drainage) or increased drainage or redness at the wound, or calf pain, call your surgeon's office.   Constipation Prevention   Complete by:  As directed    Drink plenty of fluids.  Prune juice may be helpful.  You may use a stool softener, such as Colace (over the counter) 100 mg twice a day.  Use MiraLax (over the counter) for constipation as needed.   Diet - low sodium heart healthy   Complete by:  As directed    Increase activity slowly as tolerated   Complete by:  As directed    Non weight bearing right foot and elevate right foot     Follow-up Information    [name], MD In 1 week.   Specialty:  Orthopedic Surgery Contact information: [place] [address] [phone]        [name], MD. Schedule an appointment as soon as possible for a visit.   Specialties:  Vascular Surgery, Cardiology Why:  Call to make an appointment for follow up with Dr. Claretha Cooper office  Contact information: [address] [place] [address] [phone]             Signed: Erlinda Hong, PA-C 11/12/2018, 1:03 PM [employer] [phone]

## 2018-11-12 NOTE — Progress Notes (Signed)
Pt refused accuchek for 2200.
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
## 2018-11-12 NOTE — Anesthesia Procedure Notes (Signed)
Procedure Name: MAC Date/Time: 11/12/2018 12:08 PM Performed by: Imagene Riches, CRNA Pre-anesthesia Checklist: Patient identified, Emergency Drugs available, Suction available and Patient being monitored Oxygen Delivery Method: Simple face mask Preoxygenation: Pre-oxygenation with 100% oxygen

## 2018-11-12 NOTE — Evaluation (Signed)
Physical Therapy Evaluation Patient Details Name: [name] MRN: [id number] DOB: 01/27/1956 Today's Date: 11/12/2018   History of Present Illness  Pt is a 63 y/o male s/p revision of R transmetatarsal amputation. PMH including but not limited to DM and skin cancer.  Clinical Impression  Pt presented supine in bed with HOB elevated, awake and willing to participate in therapy session. Prior to admission, pt reported that he ambulated with his cane and was independent with ADLs. Pt lives with his wife in a single level home with one small step to enter. At the time of evaluation, pt performed bed mobility at mod I level and transfers with supervision. However, he was unable to maintain NWB R LE and stated that Dr. Sharol Given was agreeable to allow him to [facility] through his heel. PT will continue to follow pt acutely for mobility progression.    Follow Up Recommendations No PT follow up;Supervision/Assistance - 24 hour;Other (comment)(pt refusing any f/u PT)    Equipment Recommendations  None recommended by PT    Recommendations for Other Services       Precautions / Restrictions Precautions Precautions: Fall Precaution Comments: wound VAC Restrictions Weight Bearing Restrictions: Yes RLE Weight Bearing: Non weight bearing      Mobility  Bed Mobility Overal bed mobility: Modified Independent                Transfers Overall transfer level: Needs assistance Equipment used: None(pt refusing any AD) Transfers: Sit to/from Stand Sit to Stand: Supervision         General transfer comment: pt standing from EOB with one UE on bed rail and the other UE pushing up from the bed with R heel propped on top of his L foot  Ambulation/Gait                Stairs            Wheelchair Mobility    Modified Rankin (Stroke Patients Only)       Balance Overall balance assessment: Needs assistance Sitting-balance support: No upper extremity supported Sitting  balance-Leahy Scale: Good     Standing balance support: Single extremity supported Standing balance-Leahy Scale: Poor                               Pertinent Vitals/Pain Pain Assessment: 0-10 Pain Score: 6  Pain Location: R foot Pain Descriptors / Indicators: Stabbing;Throbbing Pain Intervention(s): Monitored during session;Repositioned    Home Living Family/patient expects to be discharged to:: Private residence Living Arrangements: Spouse/significant other Available Help at Discharge: Family;Available 24 hours/day Type of Home: House Home Access: Stairs to enter   [employer] of Steps: 1 small step Home Layout: One level Home Equipment: Walker - 2 wheels;Cane - single point;Shower seat      Prior Function Level of Independence: Independent with assistive device(s)         Comments: ambulates with either a cane or RW     Hand Dominance        Extremity/Trunk Assessment   Upper Extremity Assessment Upper Extremity Assessment: Overall WFL for tasks assessed    Lower Extremity Assessment Lower Extremity Assessment: Overall WFL for tasks assessed;RLE deficits/detail RLE Deficits / Details: wound VAC in place at distal aspect of foot       Communication   Communication: No difficulties  Cognition Arousal/Alertness: Awake/alert Behavior During Therapy: WFL for tasks assessed/performed Overall Cognitive Status: Within Functional Limits for  tasks assessed                                        General Comments      Exercises     Assessment/Plan    PT Assessment Patient needs continued PT services  PT Problem List Decreased balance;Decreased mobility;Decreased coordination;Decreased knowledge of use of DME;Decreased safety awareness;Decreased knowledge of precautions;Pain       PT Treatment Interventions DME instruction;Gait training;Stair training;Functional mobility training;Therapeutic activities;Therapeutic  exercise;Balance training;Neuromuscular re-education;Patient/family education    PT Goals (Current goals can be found in the Care Plan section)  Acute Rehab PT Goals Patient Stated Goal: "go home tomorrow" PT Goal Formulation: With patient Time For Goal Achievement: 11/26/18 Potential to Achieve Goals: Good    Frequency Min 5X/week   Barriers to discharge        Co-evaluation               AM-PAC PT "6 Clicks" Mobility  Outcome Measure Help needed turning from your back to your side while in a flat bed without using bedrails?: None Help needed moving from lying on your back to sitting on the side of a flat bed without using bedrails?: None Help needed moving to and from a bed to a chair (including a wheelchair)?: A Little Help needed standing up from a chair using your arms (e.g., wheelchair or bedside chair)?: A Little Help needed to walk in hospital room?: A Little Help needed climbing 3-5 steps with a railing? : Total 6 Click Score: 18    End of Session   Activity Tolerance: Patient tolerated treatment well Patient left: in bed;with call bell/phone within reach Nurse Communication: Mobility status PT Visit Diagnosis: Other abnormalities of gait and mobility (R26.89);Pain Pain - Right/Left: Right Pain - part of body: Ankle and joints of foot    Time: 6579-0383 PT Time Calculation (min) (ACUTE ONLY): 23 min   Charges:   PT Evaluation $PT Eval Moderate Complexity: 1 Mod PT Treatments $Therapeutic Activity: 8-22 mins        Sherie Don, PT, DPT  Acute Rehabilitation Services Pager [phone] Office [place] 11/12/2018, 3:23 PM

## 2018-11-12 NOTE — Anesthesia Procedure Notes (Signed)
Anesthesia Regional Block: Ankle block   Pre-Anesthetic Checklist: ,, timeout performed, Correct Patient, Correct Site, Correct Laterality, Correct Procedure, Correct Position, site marked, Risks and benefits discussed,  Surgical consent,  Pre-op evaluation,  At surgeon's request and post-op pain management  Laterality: Right and Lower  Prep: chloraprep       Needles:  Injection technique: Single-shot  Needle Type: Quincke     Needle Length: 4cm  Needle Gauge: 25     Additional Needles:   Narrative:  Start time: 11/12/2018 11:40 AM End time: 11/12/2018 11:46 AM Injection made incrementally with aspirations every 5 mL.  Performed by: Personally  Anesthesiologist: Annye Asa, MD  Additional Notes: Pt identified in Holding room.  Monitors applied. Working IV access confirmed. Sterile prep R ankle.  #25ga infiltration around deep and sup peroneal, saph, sural, post tib nerves.  Total 40cc 0.5% Ropivacaine injected incrementally.  Patient asymptomatic, VSS, no heme aspirated, tolerated well.  Jenita Seashore, MD

## 2018-11-12 NOTE — Anesthesia Postprocedure Evaluation (Signed)
Anesthesia Post Note  Patient: [name]  Procedure(s) Performed: REVISION RIGHT TRANSMETATARSAL AMPUTATION (Right Foot) Application Of Wound Vac (Right Foot)     Patient location during evaluation: PACU Anesthesia Type: Regional Level of consciousness: awake and alert, patient cooperative and oriented Pain management: pain level controlled Vital Signs Assessment: post-procedure vital signs reviewed and stable Respiratory status: spontaneous breathing, nonlabored ventilation and respiratory function stable Cardiovascular status: blood pressure returned to baseline and stable Postop Assessment: no apparent nausea or vomiting Anesthetic complications: no    Last Vitals:  Vitals:   11/12/18 1247 11/12/18 1302  BP: 113/79 101/79  Pulse: 66 65  Resp: 12 12  Temp:  36.6 C  SpO2: 94% 94%    Last Pain:  Vitals:   11/12/18 1302  TempSrc:   PainSc: 0-No pain                 Stoy Fenn,E. Marlisha Vanwyk

## 2018-11-12 NOTE — Anesthesia Preprocedure Evaluation (Addendum)
Anesthesia Evaluation  Patient identified by MRN, date of birth, ID band Patient awake    Reviewed: Allergy & Precautions, NPO status , Patient's Chart, lab work & pertinent test results  History of Anesthesia Complications Negative for: history of anesthetic complications  Airway Mallampati: II  TM Distance: >3 FB Neck ROM: Full    Dental  (+) Edentulous Upper, Edentulous Lower   Pulmonary COPD, former smoker (quit 2019),    breath sounds clear to auscultation       Cardiovascular (-) hypertension(-) angina+ Peripheral Vascular Disease   Rhythm:Regular Rate:Normal  '13 ECHO: EF 55-60%, valves OK   Neuro/Psych negative neurological ROS     GI/Hepatic negative GI ROS, Neg liver ROS,   Endo/Other  diabetes (glu 180)  Renal/GU negative Renal ROS     Musculoskeletal  (+) Arthritis ,   Abdominal   Peds  Hematology negative hematology ROS (+)   Anesthesia Other Findings   Reproductive/Obstetrics                            Anesthesia Physical Anesthesia Plan  ASA: III  Anesthesia Plan: Regional and MAC   Post-op Pain Management:    Induction:   PONV Risk Score and Plan: 1 and Ondansetron  Airway Management Planned: Simple Face Mask and Natural Airway  Additional Equipment:   Intra-op Plan:   Post-operative Plan:   Informed Consent: I have reviewed the patients History and Physical, chart, labs and discussed the procedure including the risks, benefits and alternatives for the proposed anesthesia with the patient or authorized representative who has indicated his/her understanding and acceptance.       Plan Discussed with: CRNA and Surgeon  Anesthesia Plan Comments: (Plan routine monitors, Ankle block with MAC)        Anesthesia Quick Evaluation

## 2018-11-12 NOTE — Op Note (Signed)
11/12/2018  12:20 PM  PATIENT:  [name]    PRE-OPERATIVE DIAGNOSIS:  Osteomyelitis Fischer Foot  POST-OPERATIVE DIAGNOSIS:  Same  PROCEDURE:  REVISION Fischer TRANSMETATARSAL AMPUTATION, Application Of Wound Vac  SURGEON:  [name], MD  PHYSICIAN ASSISTANT:None ANESTHESIA:   General  PREOPERATIVE INDICATIONS:  [name] is a  63 y.o. male with a diagnosis of Osteomyelitis Fischer Foot who failed conservative measures and elected for surgical management.    The risks benefits and alternatives were discussed with the patient preoperatively including but not limited to the risks of infection, bleeding, nerve injury, cardiopulmonary complications, the need for revision surgery, among others, and the patient was willing to proceed.  OPERATIVE IMPLANTS: Customizable wound VAC  @ENCIMAGES @  OPERATIVE FINDINGS: Tissue viable with petechial bleeding no abscess at the resection margins.  OPERATIVE PROCEDURE: Patient was brought the operating room and underwent a regional anesthetic.  After adequate levels anesthesia were obtained patient's Fischer lower extremity was prepped using ChloraPrep draped into a sterile field a timeout was called.  A fishmouth incision was made just proximal to the ulcers medially and laterally.  Patient underwent a revision of the transmetatarsal amputation with an oscillating saw.  Electrocautery was used for hemostasis the wound was irrigated with normal saline.  The incision was closed using 2-0 nylon.  A customizable Praveena wound VAC was applied this had a good suction fit patient was taken the PACU in stable condition.   DISCHARGE PLANNING:  Antibiotic duration: 24-hour antibiotics  Weightbearing: Nonweightbearing on the Fischer  Pain medication: Opioid pathway  Dressing care/ Wound VAC: Continue wound VAC at discharge  Ambulatory devices: Walker  Discharge to: Home in 23 hours prescription called in for Vicodin.  Follow-up: In the office 1  week post operative.

## 2018-11-12 NOTE — Transfer of Care (Signed)
Immediate Anesthesia Transfer of Care Note  Patient: [name]  Procedure(s) Performed: REVISION RIGHT TRANSMETATARSAL AMPUTATION (Right Foot) Application Of Wound Vac (Right Foot)  Patient Location: PACU  Anesthesia Type:General  Level of Consciousness: awake, alert  and oriented  Airway & Oxygen Therapy: Patient Spontanous Breathing  Post-op Assessment: Report given to RN and Post -op Vital signs reviewed and stable  Post vital signs: Reviewed and stable  Last Vitals:  Vitals Value Taken Time  BP    Temp    Pulse    Resp    SpO2      Last Pain:  Vitals:   11/12/18 1135  TempSrc: Oral  PainSc: 6          Complications: No apparent anesthesia complications

## 2018-11-12 NOTE — Discharge Instructions (Signed)
Find a primary care provider as soon as possible for management of diabetes and other chronic medical problems.   Keep Prevena VAC plugged into wall outlet to keep charged.  Non weight bearing on right foot and elevate the right foot higher than the level of your heart.

## 2018-11-13 ENCOUNTER — Encounter (HOSPITAL_COMMUNITY): Payer: Self-pay | Admitting: Orthopedic Surgery

## 2018-11-13 DIAGNOSIS — E1169 Type 2 diabetes mellitus with other specified complication: Secondary | ICD-10-CM | POA: Diagnosis not present

## 2018-11-13 LAB — GLUCOSE, CAPILLARY: Glucose-Capillary: 188 mg/dL — ABNORMAL HIGH (ref 70–99)

## 2018-11-13 NOTE — Progress Notes (Signed)
Discharged home today,patient's wife driving him home.Discharged instructions,personal belongings given to patient. Verbalized understanding of instructions.

## 2018-11-13 NOTE — Discharge Summary (Signed)
Discharge Diagnoses:  Active Problems:   Dehiscence of amputation stump ([name])   Status post transmetatarsal amputation of right foot ([name])   Surgeries: Procedure(s): REVISION RIGHT TRANSMETATARSAL AMPUTATION Application Of Wound Vac on 11/12/2018    Consultants:   Discharged Condition: Improved  Hospital Course: [name] is an 63 y.o. male who was admitted 11/12/2018 with a chief complaint of osteomyelitis right foot, with a final diagnosis of Osteomyelitis Right Foot.  Patient was brought to the operating room on 11/12/2018 and underwent Procedure(s): REVISION RIGHT TRANSMETATARSAL AMPUTATION Application Of Wound Vac.    Patient was given perioperative antibiotics:  Anti-infectives (From admission, onward)   Start     Dose/Rate Route Frequency Ordered Stop   11/12/18 1100  ceFAZolin (ANCEF) IVPB 2g/100 mL premix  Status:  Discontinued     2 g 200 mL/hr over 30 Minutes Intravenous On call to O.R. 11/12/18 1053 11/12/18 1327   11/12/18 1047  ceFAZolin (ANCEF) 2-4 GM/100ML-% IVPB    Note to Pharmacy:  [name]   : cabinet override      11/12/18 1047 11/12/18 1627   11/12/18 0600  ceFAZolin (ANCEF) IVPB 2g/100 mL premix  Status:  Discontinued     2 g 200 mL/hr over 30 Minutes Intravenous On call to O.R. 11/11/18 1020 11/11/18 1021    .  Patient was given sequential compression devices, early ambulation, and aspirin for DVT prophylaxis.  Recent vital signs:  Patient Vitals for the past 24 hrs:  BP Temp Temp src Pulse Resp SpO2 Height Weight  11/13/18 0451 103/71 (!) 97.5 F (36.4 C) Oral 81 16 97 % - -  11/13/18 0113 111/66 97.7 F (36.5 C) Oral 80 18 99 % - -  11/12/18 2048 130/80 97.6 F (36.4 C) Oral 75 16 98 % - -  11/12/18 1357 122/89 (!) 97.5 F (36.4 C) Oral 74 18 97 % 5\' 8"  (1.727 m) 79.4 kg  11/12/18 1302 101/79 97.8 F (36.6 C) - 65 12 94 % - -  11/12/18 1247 113/79 - - 66 12 94 % - -  11/12/18 1232 107/78 (!) 97.3 F (36.3 C) - 70 15 96 % - -   11/12/18 1135 131/79 (!) 95.6 F (35.3 C) Oral 73 - 98 % - -  .  Recent laboratory studies: No results found.  Discharge Medications:   Allergies as of 11/13/2018      Reactions   Adhesive [tape] Other (See Comments)   Causes blisters - pls use paper tape   Bactrim Ds [sulfamethoxazole-trimethoprim] Nausea And Vomiting   Trental [pentoxifylline] Nausea And Vomiting      Medication List    STOP taking these medications   nitroGLYCERIN 0.4 mg/hr patch Commonly known as:  NITRODUR - Dosed in mg/24 hr   oxyCODONE 5 MG immediate release tablet Commonly known as:  Oxy IR/ROXICODONE   pantoprazole 40 MG tablet Commonly known as:  PROTONIX     TAKE these medications   acetaminophen 500 MG tablet Commonly known as:  TYLENOL Take 500 mg by mouth every 6 (six) hours as needed for moderate pain.   acetaminophen 325 MG tablet Commonly known as:  TYLENOL Take 2 tablets (650 mg total) by mouth every 6 (six) hours as needed for mild pain or headache.   aspirin 81 MG EC tablet Take 1 tablet (81 mg total) by mouth daily.   clopidogrel 75 MG tablet Commonly known as:  PLAVIX Take 1 tablet (75 mg total) by mouth daily with  breakfast.   glipiZIDE 10 MG tablet Commonly known as:  GLUCOTROL Take 1 tablet (10 mg total) by mouth daily before breakfast. What changed:  Another medication with the same name was added. Make sure you understand how and when to take each.   glipiZIDE 10 MG tablet Commonly known as:  Glucotrol Take 1 tablet (10 mg total) by mouth daily before breakfast. What changed:  You were already taking a medication with the same name, and this prescription was added. Make sure you understand how and when to take each.   HYDROcodone-acetaminophen 5-325 MG tablet Commonly known as:  NORCO/VICODIN Take 1 tablet by mouth every 6 (six) hours as needed for moderate pain. What changed:  Another medication with the same name was added. Make sure you understand how and when to  take each.   HYDROcodone-acetaminophen 5-325 MG tablet Commonly known as:  Norco Take 1 tablet by mouth every 6 (six) hours as needed for moderate pain. What changed:  You were already taking a medication with the same name, and this prescription was added. Make sure you understand how and when to take each.   rosuvastatin 10 MG tablet Commonly known as:  CRESTOR Take 1 tablet (10 mg total) by mouth daily at 6 PM. What changed:  Another medication with the same name was added. Make sure you understand how and when to take each.   rosuvastatin 10 MG tablet Commonly known as:  Crestor Take 1 tablet (10 mg total) by mouth at bedtime. What changed:  You were already taking a medication with the same name, and this prescription was added. Make sure you understand how and when to take each.       Diagnostic Studies: No results found.  Patient benefited maximally from their hospital stay and there were no complications.     Disposition: Discharge disposition: 01-Home or Self Care      Discharge Instructions    Call MD / Call 911   Complete by:  As directed    If you experience chest pain or shortness of breath, CALL 911 and be transported to the hospital emergency room.  If you develope a fever above 101 F, pus (white drainage) or increased drainage or redness at the wound, or calf pain, call your surgeon's office.   Call MD / Call 911   Complete by:  As directed    If you experience chest pain or shortness of breath, CALL 911 and be transported to the hospital emergency room.  If you develope a fever above 101 F, pus (white drainage) or increased drainage or redness at the wound, or calf pain, call your surgeon's office.   Constipation Prevention   Complete by:  As directed    Drink plenty of fluids.  Prune juice may be helpful.  You may use a stool softener, such as Colace (over the counter) 100 mg twice a day.  Use MiraLax (over the counter) for constipation as needed.    Constipation Prevention   Complete by:  As directed    Drink plenty of fluids.  Prune juice may be helpful.  You may use a stool softener, such as Colace (over the counter) 100 mg twice a day.  Use MiraLax (over the counter) for constipation as needed.   Diet - low sodium heart healthy   Complete by:  As directed    Diet - low sodium heart healthy   Complete by:  As directed    Increase activity slowly as tolerated  Complete by:  As directed    Non weight bearing right foot and elevate right foot   Increase activity slowly as tolerated   Complete by:  As directed    Negative Pressure Wound Therapy - Incisional   Complete by:  As directed    Attach wound VAC dressing to the portable Praveena wound VAC pump.  Teach patient how to plug the pump in to maintain its charge.   Post-op shoe   Complete by:  As directed      Follow-up Information    [name], MD In 1 week.   Specialty:  Orthopedic Surgery Contact information: [place] [address] [phone]        [name], MD. Schedule an appointment as soon as possible for a visit.   Specialties:  Vascular Surgery, Cardiology Why:  Call to make an appointment for follow up with Dr. Claretha Cooper office  Contact information: [place] [address] [phone]            Signed: [name] 11/13/2018, 9:09 AM

## 2018-11-13 NOTE — Progress Notes (Signed)
Patient ID: [name], male   DOB: 1955/10/09, 63 y.o.   MRN: [id number] Postoperative day 1 revision transmetatarsal amputation.  Patient states that he has more pain from this surgery than previous surgeries.  There is 50 cc in the wound VAC canister.  Plan for discharge on the portable Praveena pump.  Prescription sent in for Vicodin for pain.  Recommended nonweightbearing on the right foot.

## 2018-11-13 NOTE — Progress Notes (Signed)
PT Cancellation Note  Patient Details Name: EMILE KYLLO MRN: [id number] DOB: 1956/07/03   Cancelled Treatment:    Reason Eval/Treat Not Completed: Patient declined, no reason specified.  Pt agitatedly refused intervention, but any interaction.   11/13/2018  Donnella Sham, [place] [phone]  (pager) [phone]  (office)   Tessie Fass Malika Demario 11/13/2018, 12:37 PM

## 2018-11-19 ENCOUNTER — Ambulatory Visit (INDEPENDENT_AMBULATORY_CARE_PROVIDER_SITE_OTHER): Payer: 59 | Admitting: Physician Assistant

## 2018-11-19 ENCOUNTER — Encounter (INDEPENDENT_AMBULATORY_CARE_PROVIDER_SITE_OTHER): Payer: Self-pay | Admitting: Physician Assistant

## 2018-11-19 ENCOUNTER — Other Ambulatory Visit: Payer: Self-pay

## 2018-11-19 VITALS — Ht 68.0 in | Wt 175.1 lb

## 2018-11-19 DIAGNOSIS — E1151 Type 2 diabetes mellitus with diabetic peripheral angiopathy without gangrene: Secondary | ICD-10-CM

## 2018-11-19 DIAGNOSIS — Z89431 Acquired absence of right foot: Secondary | ICD-10-CM

## 2018-11-19 MED ORDER — HYDROCODONE-ACETAMINOPHEN 5-325 MG PO TABS: 1.0000 | ORAL_TABLET | Freq: Four times a day (QID) | ORAL | 0 refills | Status: DC | PRN
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
## 2018-11-26 ENCOUNTER — Encounter (INDEPENDENT_AMBULATORY_CARE_PROVIDER_SITE_OTHER): Payer: Self-pay | Admitting: Physician Assistant

## 2018-11-26 ENCOUNTER — Ambulatory Visit (INDEPENDENT_AMBULATORY_CARE_PROVIDER_SITE_OTHER): Payer: 59 | Admitting: Physician Assistant

## 2018-11-26 ENCOUNTER — Other Ambulatory Visit: Payer: Self-pay

## 2018-11-26 VITALS — Ht 68.0 in | Wt 175.0 lb

## 2018-11-26 DIAGNOSIS — E1151 Type 2 diabetes mellitus with diabetic peripheral angiopathy without gangrene: Secondary | ICD-10-CM

## 2018-11-26 DIAGNOSIS — Z89431 Acquired absence of right foot: Secondary | ICD-10-CM

## 2018-11-26 MED ORDER — DOXYCYCLINE HYCLATE 100 MG PO CAPS: 100.0000 mg | ORAL_CAPSULE | Freq: Two times a day (BID) | ORAL | 1 refills | Status: DC
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
## 2018-12-03 ENCOUNTER — Ambulatory Visit (INDEPENDENT_AMBULATORY_CARE_PROVIDER_SITE_OTHER): Payer: 59 | Admitting: Physician Assistant

## 2018-12-03 ENCOUNTER — Other Ambulatory Visit: Payer: Self-pay

## 2018-12-03 ENCOUNTER — Encounter (INDEPENDENT_AMBULATORY_CARE_PROVIDER_SITE_OTHER): Payer: Self-pay | Admitting: Physician Assistant

## 2018-12-03 VITALS — Ht 68.0 in | Wt 175.0 lb

## 2018-12-03 DIAGNOSIS — E1151 Type 2 diabetes mellitus with diabetic peripheral angiopathy without gangrene: Secondary | ICD-10-CM

## 2018-12-03 DIAGNOSIS — Z89431 Acquired absence of right foot: Secondary | ICD-10-CM

## 2018-12-03 DIAGNOSIS — E440 Moderate protein-calorie malnutrition: Secondary | ICD-10-CM

## 2018-12-03 MED ORDER — GABAPENTIN 100 MG PO CAPS: 100.0000 mg | ORAL_CAPSULE | Freq: Every day | ORAL | 1 refills | Status: DC

## 2018-12-03 MED ORDER — HYDROCODONE-ACETAMINOPHEN 5-325 MG PO TABS: 1.0000 | ORAL_TABLET | Freq: Four times a day (QID) | ORAL | 0 refills | Status: DC | PRN
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
## 2018-12-10 ENCOUNTER — Other Ambulatory Visit: Payer: Self-pay

## 2018-12-10 ENCOUNTER — Ambulatory Visit (INDEPENDENT_AMBULATORY_CARE_PROVIDER_SITE_OTHER): Payer: 59 | Admitting: Physician Assistant

## 2018-12-10 ENCOUNTER — Encounter: Payer: Self-pay | Admitting: Physician Assistant

## 2018-12-10 VITALS — Ht 68.0 in | Wt 175.0 lb

## 2018-12-10 DIAGNOSIS — Z89431 Acquired absence of right foot: Secondary | ICD-10-CM

## 2018-12-10 DIAGNOSIS — E1151 Type 2 diabetes mellitus with diabetic peripheral angiopathy without gangrene: Secondary | ICD-10-CM

## 2018-12-10 DIAGNOSIS — E440 Moderate protein-calorie malnutrition: Secondary | ICD-10-CM
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
## 2018-12-24 ENCOUNTER — Other Ambulatory Visit: Payer: Self-pay

## 2018-12-24 ENCOUNTER — Ambulatory Visit (INDEPENDENT_AMBULATORY_CARE_PROVIDER_SITE_OTHER): Payer: 59 | Admitting: Physician Assistant

## 2018-12-24 ENCOUNTER — Encounter: Payer: Self-pay | Admitting: Physician Assistant

## 2018-12-24 VITALS — Ht 68.0 in | Wt 175.0 lb

## 2018-12-24 DIAGNOSIS — E1151 Type 2 diabetes mellitus with diabetic peripheral angiopathy without gangrene: Secondary | ICD-10-CM

## 2018-12-24 DIAGNOSIS — E1142 Type 2 diabetes mellitus with diabetic polyneuropathy: Secondary | ICD-10-CM

## 2018-12-24 DIAGNOSIS — Z89431 Acquired absence of right foot: Secondary | ICD-10-CM

## 2018-12-24 DIAGNOSIS — E440 Moderate protein-calorie malnutrition: Secondary | ICD-10-CM

## 2018-12-24 MED ORDER — GABAPENTIN 100 MG PO CAPS: 100.0000 mg | ORAL_CAPSULE | Freq: Every day | ORAL | 1 refills | Status: DC
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
## 2019-01-02 ENCOUNTER — Telehealth: Payer: Self-pay | Admitting: Adult Health

## 2019-01-02 NOTE — Telephone Encounter (Signed)
Left message for return call regarding recent orthocare visit.

## 2019-01-07 ENCOUNTER — Encounter: Payer: Self-pay | Admitting: Physician Assistant

## 2019-01-07 ENCOUNTER — Ambulatory Visit (INDEPENDENT_AMBULATORY_CARE_PROVIDER_SITE_OTHER): Payer: 59 | Admitting: Physician Assistant

## 2019-01-07 ENCOUNTER — Other Ambulatory Visit: Payer: Self-pay

## 2019-01-07 VITALS — Ht 68.0 in | Wt 175.0 lb

## 2019-01-07 DIAGNOSIS — Z89431 Acquired absence of right foot: Secondary | ICD-10-CM

## 2019-01-07 MED ORDER — PENTOXIFYLLINE ER 400 MG PO TBCR: 400.0000 mg | EXTENDED_RELEASE_TABLET | Freq: Three times a day (TID) | ORAL | 1 refills | Status: DC
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
## 2019-01-21 ENCOUNTER — Ambulatory Visit: Payer: 59 | Admitting: Physician Assistant

## 2019-01-23 ENCOUNTER — Other Ambulatory Visit: Payer: Self-pay

## 2019-01-23 ENCOUNTER — Emergency Department (HOSPITAL_COMMUNITY): Payer: 59

## 2019-01-23 ENCOUNTER — Inpatient Hospital Stay (HOSPITAL_COMMUNITY)
Admission: EM | Admit: 2019-01-23 | Discharge: 2019-01-25 | DRG: 182 | Disposition: A | Discharge status: Home / Self Care | Payer: 59 | Attending: Family Medicine | Admitting: Family Medicine

## 2019-01-23 ENCOUNTER — Encounter (HOSPITAL_COMMUNITY): Payer: Self-pay | Admitting: *Deleted

## 2019-01-23 DIAGNOSIS — Z825 Family history of asthma and other chronic lower respiratory diseases: Secondary | ICD-10-CM

## 2019-01-23 DIAGNOSIS — Z794 Long term (current) use of insulin: Secondary | ICD-10-CM

## 2019-01-23 DIAGNOSIS — Z888 Allergy status to other drugs, medicaments and biological substances status: Secondary | ICD-10-CM

## 2019-01-23 DIAGNOSIS — C3412 Malignant neoplasm of upper lobe, left bronchus or lung: Principal | ICD-10-CM | POA: Diagnosis present

## 2019-01-23 DIAGNOSIS — R918 Other nonspecific abnormal finding of lung field: Secondary | ICD-10-CM

## 2019-01-23 DIAGNOSIS — R220 Localized swelling, mass and lump, head: Secondary | ICD-10-CM

## 2019-01-23 DIAGNOSIS — E11621 Type 2 diabetes mellitus with foot ulcer: Secondary | ICD-10-CM | POA: Diagnosis present

## 2019-01-23 DIAGNOSIS — E1151 Type 2 diabetes mellitus with diabetic peripheral angiopathy without gangrene: Secondary | ICD-10-CM | POA: Diagnosis present

## 2019-01-23 DIAGNOSIS — I959 Hypotension, unspecified: Secondary | ICD-10-CM | POA: Diagnosis present

## 2019-01-23 DIAGNOSIS — Z87891 Personal history of nicotine dependence: Secondary | ICD-10-CM

## 2019-01-23 DIAGNOSIS — Z85828 Personal history of other malignant neoplasm of skin: Secondary | ICD-10-CM

## 2019-01-23 DIAGNOSIS — C07 Malignant neoplasm of parotid gland: Secondary | ICD-10-CM | POA: Diagnosis present

## 2019-01-23 DIAGNOSIS — E119 Type 2 diabetes mellitus without complications: Secondary | ICD-10-CM

## 2019-01-23 DIAGNOSIS — C801 Malignant (primary) neoplasm, unspecified: Secondary | ICD-10-CM

## 2019-01-23 DIAGNOSIS — Z8249 Family history of ischemic heart disease and other diseases of the circulatory system: Secondary | ICD-10-CM

## 2019-01-23 DIAGNOSIS — C3490 Malignant neoplasm of unspecified part of unspecified bronchus or lung: Secondary | ICD-10-CM | POA: Diagnosis present

## 2019-01-23 DIAGNOSIS — L97511 Non-pressure chronic ulcer of other part of right foot limited to breakdown of skin: Secondary | ICD-10-CM | POA: Diagnosis present

## 2019-01-23 DIAGNOSIS — Z89431 Acquired absence of right foot: Secondary | ICD-10-CM

## 2019-01-23 DIAGNOSIS — E1169 Type 2 diabetes mellitus with other specified complication: Secondary | ICD-10-CM | POA: Diagnosis present

## 2019-01-23 DIAGNOSIS — Z7982 Long term (current) use of aspirin: Secondary | ICD-10-CM

## 2019-01-23 DIAGNOSIS — Z1159 Encounter for screening for other viral diseases: Secondary | ICD-10-CM

## 2019-01-23 DIAGNOSIS — Z79899 Other long term (current) drug therapy: Secondary | ICD-10-CM

## 2019-01-23 LAB — CBC WITH DIFFERENTIAL/PLATELET
Abs Immature Granulocytes: 0.04 10*3/uL (ref 0.00–0.07)
Basophils Absolute: 0.1 10*3/uL (ref 0.0–0.1)
Basophils Relative: 1 %
Eosinophils Absolute: 0.2 10*3/uL (ref 0.0–0.5)
Eosinophils Relative: 2 %
HCT: 41.5 % (ref 39.0–52.0)
Hemoglobin: 13.5 g/dL (ref 13.0–17.0)
Immature Granulocytes: 0 %
Lymphocytes Relative: 30 %
Lymphs Abs: 3.2 10*3/uL (ref 0.7–4.0)
MCH: 31.1 pg (ref 26.0–34.0)
MCHC: 32.5 g/dL (ref 30.0–36.0)
MCV: 95.6 fL (ref 80.0–100.0)
Monocytes Absolute: 0.7 10*3/uL (ref 0.1–1.0)
Monocytes Relative: 7 %
Neutro Abs: 6.4 10*3/uL (ref 1.7–7.7)
Neutrophils Relative %: 60 %
Platelets: 246 10*3/uL (ref 150–400)
RBC: 4.34 MIL/uL (ref 4.22–5.81)
RDW: 13.5 % (ref 11.5–15.5)
WBC: 10.6 10*3/uL — ABNORMAL HIGH (ref 4.0–10.5)
nRBC: 0 % (ref 0.0–0.2)

## 2019-01-23 LAB — COMPREHENSIVE METABOLIC PANEL
ALT: 9 U/L (ref 0–44)
AST: 14 U/L — ABNORMAL LOW (ref 15–41)
Albumin: 3.6 g/dL (ref 3.5–5.0)
Alkaline Phosphatase: 82 U/L (ref 38–126)
Anion gap: 10 (ref 5–15)
BUN: 9 mg/dL (ref 8–23)
CO2: 24 mmol/L (ref 22–32)
Calcium: 9.2 mg/dL (ref 8.9–10.3)
Chloride: 105 mmol/L (ref 98–111)
Creatinine, Ser: 1.11 mg/dL (ref 0.61–1.24)
GFR calc Af Amer: >60 mL/min (ref >60)
GFR calc non Af Amer: >60 mL/min (ref >60)
Glucose, Bld: 167 mg/dL — ABNORMAL HIGH (ref 70–99)
Potassium: 3.8 mmol/L (ref 3.5–5.1)
Sodium: 139 mmol/L (ref 135–145)
Total Bilirubin: 0.7 mg/dL (ref 0.3–1.2)
Total Protein: 6.2 g/dL — ABNORMAL LOW (ref 6.5–8.1)

## 2019-01-23 LAB — RAPID URINE DRUG SCREEN, HOSP PERFORMED
Amphetamines: NOT DETECTED
Barbiturates: NOT DETECTED
Benzodiazepines: NOT DETECTED
Cocaine: NOT DETECTED
Opiates: NOT DETECTED
Tetrahydrocannabinol: POSITIVE — AB

## 2019-01-23 LAB — URINALYSIS, ROUTINE W REFLEX MICROSCOPIC
Bilirubin Urine: NEGATIVE
Glucose, UA: 50 mg/dL — AB
Hgb urine dipstick: NEGATIVE
Ketones, ur: 5 mg/dL — AB
Leukocytes,Ua: NEGATIVE
Nitrite: NEGATIVE
Protein, ur: NEGATIVE mg/dL
Specific Gravity, Urine: 1.031 — ABNORMAL HIGH (ref 1.005–1.030)
pH: 6 (ref 5.0–8.0)

## 2019-01-23 LAB — SARS CORONAVIRUS 2 BY RT PCR (HOSPITAL ORDER, PERFORMED IN ~~LOC~~ HOSPITAL LAB): SARS Coronavirus 2: NEGATIVE

## 2019-01-23 LAB — LACTIC ACID, PLASMA: Lactic Acid, Venous: 1.3 mmol/L (ref 0.5–1.9)

## 2019-01-23 LAB — CBG MONITORING, ED: Glucose-Capillary: 158 mg/dL — ABNORMAL HIGH (ref 70–99)

## 2019-01-23 MED ORDER — KETOROLAC TROMETHAMINE 30 MG/ML IJ SOLN
30.0000 mg | Freq: Four times a day (QID) | INTRAMUSCULAR | Status: DC | PRN
  Administered 2019-01-23 – 2019-01-24 (×3): 30 mg via INTRAVENOUS
  Filled 2019-01-23 (×3): qty 1

## 2019-01-23 MED ORDER — FENTANYL CITRATE (PF) 100 MCG/2ML IJ SOLN
50.0000 ug | Freq: Once | INTRAMUSCULAR | Status: AC
  Administered 2019-01-23: 50 ug via INTRAVENOUS
  Filled 2019-01-23: qty 2

## 2019-01-23 MED ORDER — SODIUM CHLORIDE 0.9 % IV BOLUS (SEPSIS)
500.0000 mL | Freq: Once | INTRAVENOUS | Status: AC
  Administered 2019-01-23: 500 mL via INTRAVENOUS

## 2019-01-23 MED ORDER — ACETAMINOPHEN 325 MG PO TABS: 650.0000 mg | ORAL_TABLET | Freq: Four times a day (QID) | ORAL | Status: DC | PRN

## 2019-01-23 MED ORDER — IOHEXOL 350 MG/ML SOLN
75.0000 mL | Freq: Once | INTRAVENOUS | Status: AC | PRN
  Administered 2019-01-23: 75 mL via INTRAVENOUS

## 2019-01-23 MED ORDER — HEPARIN SODIUM (PORCINE) 5000 UNIT/ML IJ SOLN
5000.0000 [IU] | Freq: Three times a day (TID) | INTRAMUSCULAR | Status: DC
  Administered 2019-01-23 – 2019-01-25 (×4): 5000 [IU] via SUBCUTANEOUS
  Filled 2019-01-23 (×4): qty 1

## 2019-01-23 MED ORDER — ACETAMINOPHEN 650 MG RE SUPP: 650.0000 mg | Freq: Four times a day (QID) | RECTAL | Status: DC | PRN

## 2019-01-23 MED ORDER — IOHEXOL 350 MG/ML SOLN
100.0000 mL | Freq: Once | INTRAVENOUS | Status: AC | PRN
  Administered 2019-01-23: 100 mL via INTRAVENOUS

## 2019-01-23 MED ORDER — SODIUM CHLORIDE 0.9 % IV SOLN
Freq: Once | INTRAVENOUS | Status: AC
  Administered 2019-01-23: 19:00:00 via INTRAVENOUS

## 2019-01-23 MED ORDER — SODIUM CHLORIDE 0.9 % IV BOLUS (SEPSIS)
1000.0000 mL | Freq: Once | INTRAVENOUS | Status: AC
  Administered 2019-01-23: 1000 mL via INTRAVENOUS

## 2019-01-23 MED ORDER — SODIUM CHLORIDE 0.9 % IV SOLN: INTRAVENOUS | Status: AC

## 2019-01-23 MED ORDER — INSULIN ASPART 100 UNIT/ML ~~LOC~~ SOLN: 0.0000 [IU] | Freq: Three times a day (TID) | SUBCUTANEOUS | Status: DC

## 2019-01-23 NOTE — ED Notes (Signed)
[name] bp 80/50 rt arm p68

## 2019-01-23 NOTE — ED Triage Notes (Signed)
Nausea dizziness painful area with swelling area in front of his rt ear with swelling    The nkot has been there for 2 months  Low bp on arrival  Alert oriented skin warm and dry

## 2019-01-23 NOTE — ED Notes (Signed)
Wife, margaret "meg"- [phone] for updates, has not been updated since pt arrived

## 2019-01-23 NOTE — ED Notes (Signed)
Pt c/o pain still in the swollen area around his rt ear

## 2019-01-23 NOTE — ED Provider Notes (Signed)
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 swelling but overall no findings consistent with osteomyelitis or gas.  Patient with negative coronavirus test.  Blood pressure has improved to 90 systolic following fluids.  Will start maintenance fluids.  Awaiting CT head, CT face to further evaluate mass that is causing extreme pain.  Images show facial mass and lung mass.  Suspect that patient likely has a primary lung cancer.  Discussed results on the phone with radiology.  There is no signs of compressive etiology of these.  IVC is normal.  Pneumothorax.  No PE.  No obvious infectious findings of the face and lung.  Patient hemodynamically stable following IV fluids.  Admitted for further pain control and hydration.  May benefit from oncology evaluation as well to begin figuring out primary diagnosis.  Do not believe patient has sepsis.  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  This chart was dictated using voice recognition software.  Despite best efforts to proofread,  errors can occur which can  change the documentation meaning.  Final Clinical Impressions(s) / ED Diagnoses   Final diagnoses:  Hypotension, unspecified hypotension type  Lung mass  Facial mass    ED Discharge Orders    None       Lennice Sites, DO 01/23/19 2108
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
## 2019-01-23 NOTE — ED Notes (Signed)
The pt has been nasty to staff all night.he went into a rage when I asked him about refusing the cbg.  He reports that he knows his body better than anyone and he does not need any god damn  Blood sugar checks  And he hates needles/  He goes into a frenzy talking about the numerus surgeries he has had on his rt foot.  His behavior has been the same since he arrived even before he received the news about his cancer

## 2019-01-23 NOTE — ED Notes (Signed)
The pt refuses to let anyone check his blood sugar

## 2019-01-23 NOTE — ED Notes (Signed)
The pt has spopken to his wife on the phone  Telling her he is going to be admitted

## 2019-01-23 NOTE — ED Notes (Signed)
Pt  Pain is better

## 2019-01-23 NOTE — ED Notes (Signed)
The rt foot has toes amputated wound looks clean   He is on blood thinners he does not know name

## 2019-01-23 NOTE — ED Notes (Signed)
Pt to CT

## 2019-01-23 NOTE — ED Notes (Signed)
The pt reports that he is starting to hurt again  Meal given

## 2019-01-24 ENCOUNTER — Other Ambulatory Visit: Payer: Self-pay

## 2019-01-24 DIAGNOSIS — Z79899 Other long term (current) drug therapy: Secondary | ICD-10-CM | POA: Diagnosis not present

## 2019-01-24 DIAGNOSIS — R918 Other nonspecific abnormal finding of lung field: Secondary | ICD-10-CM

## 2019-01-24 DIAGNOSIS — E1151 Type 2 diabetes mellitus with diabetic peripheral angiopathy without gangrene: Secondary | ICD-10-CM | POA: Diagnosis present

## 2019-01-24 DIAGNOSIS — Z8249 Family history of ischemic heart disease and other diseases of the circulatory system: Secondary | ICD-10-CM | POA: Diagnosis not present

## 2019-01-24 DIAGNOSIS — C801 Malignant (primary) neoplasm, unspecified: Secondary | ICD-10-CM | POA: Diagnosis not present

## 2019-01-24 DIAGNOSIS — Z888 Allergy status to other drugs, medicaments and biological substances status: Secondary | ICD-10-CM | POA: Diagnosis not present

## 2019-01-24 DIAGNOSIS — Z825 Family history of asthma and other chronic lower respiratory diseases: Secondary | ICD-10-CM | POA: Diagnosis not present

## 2019-01-24 DIAGNOSIS — Z85828 Personal history of other malignant neoplasm of skin: Secondary | ICD-10-CM | POA: Diagnosis not present

## 2019-01-24 DIAGNOSIS — Z89431 Acquired absence of right foot: Secondary | ICD-10-CM | POA: Diagnosis not present

## 2019-01-24 DIAGNOSIS — C07 Malignant neoplasm of parotid gland: Secondary | ICD-10-CM | POA: Diagnosis present

## 2019-01-24 DIAGNOSIS — I959 Hypotension, unspecified: Secondary | ICD-10-CM | POA: Diagnosis present

## 2019-01-24 DIAGNOSIS — L97511 Non-pressure chronic ulcer of other part of right foot limited to breakdown of skin: Secondary | ICD-10-CM | POA: Diagnosis present

## 2019-01-24 DIAGNOSIS — C3490 Malignant neoplasm of unspecified part of unspecified bronchus or lung: Secondary | ICD-10-CM | POA: Diagnosis present

## 2019-01-24 DIAGNOSIS — Z794 Long term (current) use of insulin: Secondary | ICD-10-CM | POA: Diagnosis not present

## 2019-01-24 DIAGNOSIS — Z87891 Personal history of nicotine dependence: Secondary | ICD-10-CM | POA: Diagnosis not present

## 2019-01-24 DIAGNOSIS — Z1159 Encounter for screening for other viral diseases: Secondary | ICD-10-CM | POA: Diagnosis not present

## 2019-01-24 DIAGNOSIS — Z720 Tobacco use: Secondary | ICD-10-CM | POA: Diagnosis not present

## 2019-01-24 DIAGNOSIS — E1169 Type 2 diabetes mellitus with other specified complication: Secondary | ICD-10-CM | POA: Diagnosis present

## 2019-01-24 DIAGNOSIS — C3412 Malignant neoplasm of upper lobe, left bronchus or lung: Secondary | ICD-10-CM | POA: Diagnosis present

## 2019-01-24 DIAGNOSIS — E11621 Type 2 diabetes mellitus with foot ulcer: Secondary | ICD-10-CM | POA: Diagnosis present

## 2019-01-24 DIAGNOSIS — Z7982 Long term (current) use of aspirin: Secondary | ICD-10-CM | POA: Diagnosis not present

## 2019-01-24 LAB — URINE CULTURE: Culture: <10000 — AB

## 2019-01-24 LAB — GLUCOSE, CAPILLARY
Glucose-Capillary: 174 mg/dL — ABNORMAL HIGH (ref 70–99)
Glucose-Capillary: 137 mg/dL — ABNORMAL HIGH (ref 70–99)

## 2019-01-24 MED ORDER — SODIUM CHLORIDE 0.9 % IV SOLN
INTRAVENOUS | Status: DC
  Administered 2019-01-24: 18:00:00 via INTRAVENOUS

## 2019-01-24 MED ORDER — GABAPENTIN 100 MG PO CAPS
200.0000 mg | ORAL_CAPSULE | Freq: Every day | ORAL | Status: DC
  Administered 2019-01-24: 200 mg via ORAL
  Filled 2019-01-24: qty 2

## 2019-01-24 MED ORDER — PENTOXIFYLLINE ER 400 MG PO TBCR
400.0000 mg | EXTENDED_RELEASE_TABLET | Freq: Three times a day (TID) | ORAL | Status: DC
  Administered 2019-01-25 (×2): 400 mg via ORAL
  Filled 2019-01-24 (×5): qty 1

## 2019-01-24 MED ORDER — SODIUM CHLORIDE 0.9 % IV SOLN
INTRAVENOUS | Status: DC
  Administered 2019-01-25: 75 mL/h via INTRAVENOUS

## 2019-01-24 MED ORDER — HYDROCODONE-ACETAMINOPHEN 5-325 MG PO TABS
1.0000 | ORAL_TABLET | Freq: Four times a day (QID) | ORAL | Status: DC | PRN
  Administered 2019-01-24 – 2019-01-25 (×4): 1 via ORAL
  Filled 2019-01-24 (×4): qty 1

## 2019-01-24 NOTE — Progress Notes (Signed)
Pt updated wife over phone this AM

## 2019-01-24 NOTE — ED Notes (Signed)
Pt refused to allow this RN to check his blood sugar before he ate the breakfast tray that was ordered for him, he was angry that he was even approached on the subject.

## 2019-01-24 NOTE — ED Notes (Signed)
Pain med given 

## 2019-01-24 NOTE — ED Notes (Signed)
The pts wife has called and talked to the admitting doctor

## 2019-01-24 NOTE — ED Notes (Signed)
Tele   Breakfast ordered  
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
## 2019-01-24 NOTE — Progress Notes (Signed)
[facility] PROGRESS NOTE  DOMONIC KIMBALL LAG:[id number] DOB: [birth year] DOA: 01/23/2019 PCP: Patient, No Pcp Per  63 y/o ?-prior skin cancer [? MOHS surgery R temporal region-Dr. Janann August ~ 2 yr ago] Former heavy smoker-severe PAD-s/p Lis-franc amputation Dr. Sharol Given.  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 Janann August of dermatology Discussed with Dr. Blenda Nicely ENT-we will need FNA as Op--this is set-up for Wednesday LUL lung mass Vacillating regarding getting work-up--have mentioned we can have him talk to lung docs if he is interested Diet-controlled diabetes mellitus Keep on sliding scale supplementation and monitor Right Lisfranc amputation Unhealed wound however does not require antibiotics and will need follow-up with Dr. Jess Barters office    Verlon Au, MD  [facility] [place] [email or web address] 7PM-7AM contact night coverage as above 01/24/2019, 9:33 AM  LOS: 0 days   Consultants:  None at this time  Procedures:  None  Antimicrobials:  No  Interval history/Subjective:  irritable very perturbed and seems to be quite unhappy with his situation regarding his leg No chest pain no fever no cough no cold  Objective:  Vitals:  Vitals:   01/24/19 0815 01/24/19 0913  BP: 133/78 (!) 147/96  Pulse: 73 72  Resp:  16  Temp:  97.8 F (36.6 C)  SpO2: 100% 100%    Exam:  EOMI NCAT no distress Chest clinically clear no added sound S1-S2 no murmur rub or gallop Abdomen soft no  rebound or guarding Right leg has Lisfranc amputation with at least 7 cm x 3 cm open--- it however does not have any pus on pressure over the wound   I have personally reviewed the following:  DATA   Labs:  Sodium 139 BUN/creatinine 9/1.1 lactic acid 1.3 AST 14/ALT 9  WBC 10.6   Review and summation of old records:  Summarized  Scheduled Meds: . heparin  5,000 Units Subcutaneous Q8H  . insulin aspart  0-9 Units Subcutaneous TID WC   Continuous Infusions:  Principal Problem:   Hypotension Active Problems:   Type 2 diabetes mellitus ([place])   Malignancy (HCC)   LOS: 0 days

## 2019-01-24 NOTE — Progress Notes (Signed)
D/e Dr. Willette Alma and Dr. Fredric Dine Will probably need PET prior to CT biopsy, Dr. Lindi Adie will see in am appreciate input
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
## 2019-01-25 ENCOUNTER — Telehealth: Payer: Self-pay | Admitting: Hematology and Oncology

## 2019-01-25 ENCOUNTER — Other Ambulatory Visit: Payer: Self-pay | Admitting: *Deleted

## 2019-01-25 DIAGNOSIS — R911 Solitary pulmonary nodule: Secondary | ICD-10-CM

## 2019-01-25 MED ORDER — GLIPIZIDE 5 MG PO TABS: 10.0000 mg | ORAL_TABLET | Freq: Every day | ORAL | 3 refills | Status: DC

## 2019-01-25 MED ORDER — ROSUVASTATIN CALCIUM 10 MG PO TABS: 10.0000 mg | ORAL_TABLET | Freq: Every day | ORAL | 3 refills | Status: DC

## 2019-01-25 MED ORDER — CLOPIDOGREL BISULFATE 75 MG PO TABS: 75.0000 mg | ORAL_TABLET | Freq: Every day | ORAL | 3 refills | Status: DC

## 2019-01-25 MED ORDER — ASPIRIN 81 MG PO TBEC: 81.0000 mg | DELAYED_RELEASE_TABLET | Freq: Every day | ORAL | 3 refills | Status: AC

## 2019-01-25 MED ORDER — HYDROCODONE-ACETAMINOPHEN 5-325 MG PO TABS: 1.0000 | ORAL_TABLET | Freq: Four times a day (QID) | ORAL | 0 refills | Status: DC | PRN

## 2019-01-25 NOTE — TOC Transition Note (Signed)
Transition of Care [facility]) - CM/SW Discharge Note   Patient Details  Name: [name] MRN: [id number] Date of Birth: 26-Nov-1955  Transition [facility]) CM/SW Contact:  Zenon Mayo, RN Phone Number: 01/25/2019, 10:39 AM   Clinical Narrative:    From home with spouse, he has a walker, cane and crutches.  He has no issues with getting medications.  He has transportation at [employer]. He has hospital follow up at [facility] on [place].    Final next level of care: Home/Self Care Barriers to Discharge: No Barriers Identified   Patient Goals and CMS Choice Patient states their goals for this hospitalization and ongoing recovery are:: get well   Choice offered to / list presented to : NA  Discharge Placement                       Discharge Plan and Services                DME Arranged: (NA)         HH Arranged: NA          Social Determinants of Health (SDOH) Interventions     Readmission Risk Interventions No flowsheet data found.

## 2019-01-25 NOTE — Telephone Encounter (Signed)
I attempted to call the Mr. Rahimi' wife to schedule a hospital fu appt w/Dr. Lindi Adie, but the vm was full. Will mail a letter.

## 2019-01-25 NOTE — Plan of Care (Signed)

## 2019-01-26 ENCOUNTER — Inpatient Hospital Stay: Payer: 59 | Attending: Hematology and Oncology | Admitting: Hematology and Oncology

## 2019-01-26 ENCOUNTER — Telehealth: Payer: Self-pay | Admitting: Hematology and Oncology

## 2019-01-26 DIAGNOSIS — R918 Other nonspecific abnormal finding of lung field: Secondary | ICD-10-CM | POA: Insufficient documentation

## 2019-01-26 NOTE — Telephone Encounter (Signed)
Returned call to wife re switching providers. Patient has not been seen yet for hospital follow up with Dr. Lindi Adie.  Wife is a patient of [facility] and may want husband to see him being that she already knows him. Patient assigned to [facility] from hospital and [facility] is lung specialist.   Wife will think about who she/pt would like to be seen by and call me back. Hospital follow not until 7/2. Should wife decide to change message would need to be sent to the providers to get an ok. wife aware.

## 2019-01-26 NOTE — Progress Notes (Signed)
HEMATOLOGY-ONCOLOGY TELEPHONE VISIT PROGRESS NOTE  I connected with [name] on 01/26/19 at 12:00 PM EDT by telephone and verified that I am speaking with the correct person using two identifiers.  I discussed the limitations, risks, security and privacy concerns of performing an evaluation and management service by telephone and the availability of in person appointments.  I also discussed with the patient that there may be a patient responsible charge related to this service. The patient expressed understanding and agreed to proceed.   History of Present Illness: Patient was recently admitted to the hospital with lightheadedness and dizziness and was found to be hypotensive.  He has had recent surgeries on his foot which has not been healing too well.  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  After much discussion with radiology and pulmonary the decision was made for the patient to follow-up with me as an outpatient after undergoing a PET CT scan so that we can determine if he needs a biopsy. Patient called [place] this morning anxious and worried about having to undergo a biopsy because he has a severe phobia for needles.  He has lost 15 pounds because of these surgeries on his leg.  He has a prior history of skin cancer surgery.  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  Observations/Objective:  Patient is very anxious   Assessment Plan:  Lung mass: I discussed with him to keep the appointment for the PET CT scan. It is possible that the PET/CT may not show any activity in which case we will monitor. If it shows low-level activity then we could also continue to watch and monitor with another scan in 3 months. However if it is grossly metabolic then we will have to figure out a way to biopsy him.  He will not allow [place] to do CT-guided biopsy so it has to be through bronchoscopy.  I will see him after the PET scan to discuss results.  I discussed the assessment and treatment plan with the patient. The patient was provided an opportunity to ask questions and all were answered. The patient agreed with the plan and demonstrated an understanding of the instructions. The patient was advised to call back or seek an in-person evaluation if the symptoms worsen or if the condition fails to improve as anticipated.   I provided 15 minutes of non-face-to-face time during this encounter. Harriette Ohara, MD

## 2019-01-28 ENCOUNTER — Other Ambulatory Visit: Payer: Self-pay

## 2019-01-28 ENCOUNTER — Encounter: Payer: Self-pay | Admitting: Physician Assistant

## 2019-01-28 ENCOUNTER — Ambulatory Visit (INDEPENDENT_AMBULATORY_CARE_PROVIDER_SITE_OTHER): Payer: 59 | Admitting: Physician Assistant

## 2019-01-28 DIAGNOSIS — Z89431 Acquired absence of right foot: Secondary | ICD-10-CM

## 2019-01-28 LAB — CULTURE, BLOOD (ROUTINE X 2)
Culture: NO GROWTH
Culture: NO GROWTH
Special Requests: ADEQUATE

## 2019-01-28 MED ORDER — GABAPENTIN 100 MG PO CAPS: 100.0000 mg | ORAL_CAPSULE | ORAL | 2 refills | Status: DC

## 2019-01-28 NOTE — Assessment & Plan Note (Signed)
CT chest 01/23/2019:5.4 cm left anterior upper lobe mass highly suspicious for primary lung malignancy. 7 mm circumscribed perifissural nodule in the superior segment of the left lower lobe may represent intraparenchymal lymph node. 9 mm left precarinal lymph node, indeterminate.  PET CT scan  Radiology review: Patient has severe phobia for needles.
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
## 2019-01-31 NOTE — Discharge Summary (Signed)
Physician Discharge Summary  [name] CXK:[id number] DOB: [birth year] DOA: 01/23/2019  PCP: Patient, No Pcp Per  Admit date: 01/23/2019 Discharge date: 01/31/2019  Time spent: 30 minutes  Recommendations for Outpatient Follow-up:  1. Requires follow-up oncology, orthopedics etc. 2. Requires follow-up with ENT which has been set up already 3. Labs in 1 week 4. Resume aspirin Plavix after biopsies 5. May benefit from psychiatric referral  Discharge Diagnoses:  Principal Problem:   Hypotension Active Problems:   Type 2 diabetes mellitus ([place])   Malignancy ([place])   Lung cancer [facility])   Discharge Condition: Fair  Diet recommendation: Regular  Filed Weights   01/24/19 1905  Weight: 79.4 kg    History of present illness:  63 y/o ?-prior skin cancer [? MOHS surgery R temporal region-Dr. Janann August ~ 2 yr ago] Former heavy smoker-severe PAD-s/p Lis-franc amputation Dr. Sharol Given.  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 Janann August of dermatology Discussed with Dr. Blenda Nicely ENT-we will need FNA as Op--this was set up on discharge and patient's wife will transport him there LUL lung mass Vacillating regarding getting work-up--we consulted pulmonology who recommended IR biopsy/CT-guided biopsy-I discussed this with Dr. Lindi Adie we will set the patient up for PET scan as this may be less invasive and it may be a better yield from a less risky place to biopsy-appreciate input Diet-controlled diabetes mellitus stable during hospital  stay Right Lisfranc amputation Unhealed wound however does not require antibiotics and will need follow-up with Dr. Jess Barters office     Consultations:  Oncology Dr. Lindi Adie  Dr. Blenda Nicely ENT  Discussed with Dr. Vernard Gambles of IR  Discharge Exam: Vitals:   01/24/19 2321 01/25/19 0757  BP: 124/79 (!) 146/99  Pulse: 62 63  Resp: 14   Temp: 98 F (36.7 C) (!) 97.5 F (36.4 C)  SpO2: 97% 99%    General: Awake alert coherent no distress extraocular movements intact area over right parotid gland is swollen tender and firm nonfluctuant Cardiovascular: S1-S2 no murmur rub or gallop Respiratory: Clinically clear no added sound Abdomen soft no rebound no guarding No lower extremity edema  Discharge Instructions   Discharge Instructions    Diet - low sodium heart healthy   Complete by: As directed    Discharge instructions   Complete by: As directed    I have resumed some of your medications and refilled your blood sugar and cholesterol medicines DO NOT take Aspirin or Plavix for the next 1-2 days--you will be getting a biopsy--after the biopsy, you can resume these 2 medicines Dr. Lindi Adie will schedule an Out-patient follow up after you are seen by him---please make sure you go to see him for your lung cancer--it is important that this is taken care of Follow up with Dr. Sharol Given for your foot issues   Increase activity slowly   Complete by: As directed      Allergies as of 01/25/2019      Reactions   Adhesive [tape] Other (See Comments)   CAUSES BLISTERS- PLEASE USE PAPER TAPE!!   Bactrim  Ds [sulfamethoxazole-trimethoprim] Nausea And Vomiting   Trental [pentoxifylline] Nausea And Vomiting      Medication List    STOP taking these medications   acetaminophen 325 MG tablet Commonly known as: TYLENOL   doxycycline 100 MG capsule Commonly known as: VIBRAMYCIN   pentoxifylline 400 MG CR tablet Commonly known as: TRENTAL     TAKE these medications   aspirin 81 MG EC  tablet Take 1 tablet (81 mg total) by mouth daily.   clopidogrel 75 MG tablet Commonly known as: PLAVIX Take 1 tablet (75 mg total) by mouth daily with breakfast.   glipiZIDE 5 MG tablet Commonly known as: GLUCOTROL Take 2 tablets (10 mg total) by mouth daily before breakfast. What changed:   medication strength  Another medication with the same name was removed. Continue taking this medication, and follow the directions you see here.   HYDROcodone-acetaminophen 5-325 MG tablet Commonly known as: Norco Take 1 tablet by mouth every 6 (six) hours as needed for up to 12 doses for moderate pain.   rosuvastatin 10 MG tablet Commonly known as: CRESTOR Take 1 tablet (10 mg total) by mouth daily at 6 PM. What changed: Another medication with the same name was removed. Continue taking this medication, and follow the directions you see here.      Allergies  Allergen Reactions  . Adhesive [Tape] Other (See Comments)    CAUSES BLISTERS- PLEASE USE PAPER TAPE!!  . Bactrim Ds [Sulfamethoxazole-Trimethoprim] Nausea And Vomiting  . Trental [Pentoxifylline] Nausea And Vomiting   Follow-up [facility] Follow up on 02/08/2019.   Specialty: Internal Medicine Why: 1 pm for hospital follow up and establish as new patient, you will be going into clinic Contact information: [name] [id number] [place] [name] [phone]           The results of significant diagnostics from this hospitalization (including imaging, microbiology, ancillary and laboratory) are listed below for reference.    Significant Diagnostic Studies: Ct Angio Head W Or Wo Contrast  Result Date: 01/23/2019 CLINICAL DATA:  Pain in RIGHT ear. Mass has been there for 2.5 months. Hypotension with dizziness. EXAM: CT ANGIOGRAPHY HEAD AND NECK TECHNIQUE: Multidetector CT imaging of the head and neck was performed using the standard protocol during bolus  administration of intravenous contrast. Multiplanar CT image reconstructions and MIPs were obtained to evaluate the vascular anatomy. Carotid stenosis measurements (when applicable) are obtained utilizing NASCET criteria, using the distal internal carotid diameter as the denominator. CONTRAST:  169mL OMNIPAQUE IOHEXOL 350 MG/ML SOLN COMPARISON:  CT maxillofacial reported separately. FINDINGS: CT HEAD FINDINGS Brain: No evidence for acute infarction, hemorrhage, mass lesion, hydrocephalus, or extra-axial fluid. Normal for age cerebral volume. Hypoattenuation of white matter, likely small vessel disease. Vascular: Reported separately. Skull: Calvarium intact. Sinuses: Clear sinuses. Orbits: Negative orbits. CTA NECK FINDINGS Aortic arch: Standard branching. Imaged portion shows no evidence of aneurysm or dissection. No significant stenosis of the major arch vessel origins. Right carotid system: No evidence of dissection, stenosis (50% or greater) or occlusion. Nonstenotic atheromatous change at the carotid bifurcation. Left carotid system: No evidence of dissection, stenosis (50% or greater) or occlusion. Nonstenotic atheromatous change at the carotid bifurcation. Vertebral arteries: Codominant. No evidence of dissection, stenosis (50% or greater) or occlusion. Skeleton: Advanced cervical spondylosis. No fracture, osseous destructive lesion or traumatic subluxation. The patient is edentulous. Other neck: Reported separately. RIGHT preauricular mass, likely malignant. Upper chest: There is an  anterior LEFT upper lobe mass, roughly 2 x 4 cm, with mediastinal adenopathy. Concern for pulmonary malignancy. Review of the MIP images confirms the above findings CTA HEAD FINDINGS Anterior circulation: Calcification of the cavernous internal carotid arteries consistent with cerebrovascular atherosclerotic disease. No signs of intracranial large vessel occlusion or significant stenosis. No saccular aneurysm. Posterior  circulation: No significant stenosis, proximal occlusion, aneurysm, or vascular malformation. Venous sinuses: As permitted by contrast timing, patent. Anatomic variants: None of significance. Delayed phase: Not performed. IMPRESSION: 1. No extracranial or intracranial flow reducing lesion is observed. 2. 2 x 4 cm LEFT upper lobe mass, with mediastinal adenopathy, concerning for bronchogenic carcinoma. Consider CTA chest for further evaluation. 3. RIGHT preauricular mass, likely malignant. This is described separately. 4. No acute or focal intracranial abnormality. Electronically Signed   By: Staci Righter M.D.   On: 01/23/2019 18:37   Ct Angio Neck W And/or Wo Contrast  Result Date: 01/23/2019 CLINICAL DATA:  Pain in RIGHT ear. Mass has been there for 2.5 months. Hypotension with dizziness. EXAM: CT ANGIOGRAPHY HEAD AND NECK TECHNIQUE: Multidetector CT imaging of the head and neck was performed using the standard protocol during bolus administration of intravenous contrast. Multiplanar CT image reconstructions and MIPs were obtained to evaluate the vascular anatomy. Carotid stenosis measurements (when applicable) are obtained utilizing NASCET criteria, using the distal internal carotid diameter as the denominator. CONTRAST:  133mL OMNIPAQUE IOHEXOL 350 MG/ML SOLN COMPARISON:  CT maxillofacial reported separately. FINDINGS: CT HEAD FINDINGS Brain: No evidence for acute infarction, hemorrhage, mass lesion, hydrocephalus, or extra-axial fluid. Normal for age cerebral volume. Hypoattenuation of white matter, likely small vessel disease. Vascular: Reported separately. Skull: Calvarium intact. Sinuses: Clear sinuses. Orbits: Negative orbits. CTA NECK FINDINGS Aortic arch: Standard branching. Imaged portion shows no evidence of aneurysm or dissection. No significant stenosis of the major arch vessel origins. Right carotid system: No evidence of dissection, stenosis (50% or greater) or occlusion. Nonstenotic  atheromatous change at the carotid bifurcation. Left carotid system: No evidence of dissection, stenosis (50% or greater) or occlusion. Nonstenotic atheromatous change at the carotid bifurcation. Vertebral arteries: Codominant. No evidence of dissection, stenosis (50% or greater) or occlusion. Skeleton: Advanced cervical spondylosis. No fracture, osseous destructive lesion or traumatic subluxation. The patient is edentulous. Other neck: Reported separately. RIGHT preauricular mass, likely malignant. Upper chest: There is an anterior LEFT upper lobe mass, roughly 2 x 4 cm, with mediastinal adenopathy. Concern for pulmonary malignancy. Review of the MIP images confirms the above findings CTA HEAD FINDINGS Anterior circulation: Calcification of the cavernous internal carotid arteries consistent with cerebrovascular atherosclerotic disease. No signs of intracranial large vessel occlusion or significant stenosis. No saccular aneurysm. Posterior circulation: No significant stenosis, proximal occlusion, aneurysm, or vascular malformation. Venous sinuses: As permitted by contrast timing, patent. Anatomic variants: None of significance. Delayed phase: Not performed. IMPRESSION: 1. No extracranial or intracranial flow reducing lesion is observed. 2. 2 x 4 cm LEFT upper lobe mass, with mediastinal adenopathy, concerning for bronchogenic carcinoma. Consider CTA chest for further evaluation. 3. RIGHT preauricular mass, likely malignant. This is described separately. 4. No acute or focal intracranial abnormality. Electronically Signed   By: Staci Righter M.D.   On: 01/23/2019 18:37   Ct Angio Chest Pe W And/or Wo Contrast  Result Date: 01/23/2019 CLINICAL DATA:  Nausea and dizziness. EXAM: CT ANGIOGRAPHY CHEST WITH CONTRAST TECHNIQUE: Multidetector CT imaging of the chest was performed using the standard protocol during bolus administration of intravenous contrast. Multiplanar CT image reconstructions  and MIPs were obtained  to evaluate the vascular anatomy. CONTRAST:  47mL OMNIPAQUE IOHEXOL 350 MG/ML SOLN COMPARISON:  Chest radiograph 01/23/2019 FINDINGS: Cardiovascular: Satisfactory opacification of the pulmonary arteries to the segmental level. No evidence of pulmonary embolism. Normal heart size. No pericardial effusion. Mediastinum/Nodes: Prominent left precarinal lymph node measures 9 mm in short axis. Normal appearance of the esophagus and thyroid gland. Lungs/Pleura: 5.4 x 2.7 by 2.6 cm left anterior upper lobe mass highly suspicious for malignancy. The mass abuts the medial pleura and mediastinum. Lungs PICC use extend to the anterior and lateral pleura as well. Additional 7 mm circumscribed soft tissue perifissural nodule in the superior segment of the left lower lobe, image 58/92, sequence 7. The right lung is clear. Mild paraseptal emphysematous changes in the bilateral apices. Upper Abdomen: No acute abnormality. Partially visualized anterior abdominal wall repair. Musculoskeletal: No chest wall abnormality. No acute or significant osseous findings. Review of the MIP images confirms the above findings. IMPRESSION: 1. 5.4 cm left anterior upper lobe mass highly suspicious for primary lung malignancy. 2. 7 mm circumscribed perifissural nodule in the superior segment of the left lower lobe may represent intraparenchymal lymph node. 3. 9 mm left precarinal lymph node, indeterminate. 4. Mild paraseptal emphysematous changes in the lung apices. Emphysema (ICD10-J43.9). Electronically Signed   By: Fidela Salisbury M.D.   On: 01/23/2019 19:54   Ct Maxillofacial W Contrast  Result Date: 01/23/2019 CLINICAL DATA:  RIGHT preauricular mass. History of skin cancer. EXAM: CT MAXILLOFACIAL WITH CONTRAST TECHNIQUE: Multidetector CT imaging of the maxillofacial structures was performed with intravenous contrast. Multiplanar CT image reconstructions were also generated. CONTRAST:  132mL OMNIPAQUE IOHEXOL 350 MG/ML SOLN COMPARISON:   None. FINDINGS: Osseous: No fracture or mandibular dislocation. No destructive process. Patient is edentulous. Orbits: Negative. No traumatic or inflammatory finding. Sinuses: Clear. Soft tissues: There is a centrally necrotic 30 x 24 x 28 mm superficial mass in the RIGHT pre-auricular region. Peripherally there is postcontrast enhancement. It is inseparable from the RIGHT parotid gland, but I believe its origin is more superficial. The lesion is concerning for an invasive aggressive skin cancer. A necrotic lymph node is not excluded although no significant adenopathy is observed elsewhere. A superficial lobe parotid tumor could have this appearance but is less favored. Given its location, the RIGHT facial nerve is at risk for perineural tumor spread. Limited intracranial: Reported separately. No acute findings. IMPRESSION: 30 x 24 x 28 mm superficial mass in the RIGHT pre-auricular region, concerning for an invasive squamous cell carcinoma. A superficial lobe parotid tumor could have this appearance. Tissue sampling is warranted. These results were called by telephone at the time of interpretation on 01/23/2019 at 6:30 pm to Dr. Lennice Sites , who verbally acknowledged these results. Electronically Signed   By: Staci Righter M.D.   On: 01/23/2019 18:44   Dg Chest Port 1 View  Result Date: 01/23/2019 CLINICAL DATA:  Hypotension. EXAM: PORTABLE CHEST 1 VIEW COMPARISON:  07/07/2012 FINDINGS: Cardiomediastinal silhouette is normal. Mediastinal contours appear intact. There is no evidence of focal airspace consolidation, pleural effusion or pneumothorax. Osseous structures are without acute abnormality. Soft tissues are grossly normal. IMPRESSION: No active disease. Electronically Signed   By: Fidela Salisbury M.D.   On: 01/23/2019 17:11   Dg Foot Complete Right  Result Date: 01/23/2019 CLINICAL DATA:  Hypotension foot infection EXAM: RIGHT FOOT COMPLETE - 3+ VIEW COMPARISON:  Eleven 16 2019 FINDINGS: No  fracture or malalignment. Interval amputation of the distal foot  at the level of the proximal metatarsals. No erosion or gross bony destructive change. Heterogenous lucencies within the residual ankle and tarsal bones, suggesting osteopenia. Diffuse soft tissue swelling without soft tissue emphysema. IMPRESSION: 1. Soft tissue swelling with probable ulceration at the distal stump. 2. Interval amputation of the distal foot at the level of the proximal metatarsals, without definitive acute osseous abnormality 3. Heterogenous lucencies within the remnant tarsal bones as well as the distal fibula and tibia, possibly due to osteopenia. Electronically Signed   By: Donavan Foil M.D.   On: 01/23/2019 17:14    Microbiology: Recent Results (from the past 240 hour(s))  SARS Coronavirus 2 (CEPHEID - Performed in [place] hospital lab), Hosp Order     Status: None   Collection Time: 01/23/19  4:30 PM   Specimen: Nasopharyngeal Swab  Result Value Ref Range Status   SARS Coronavirus 2 NEGATIVE NEGATIVE Final    Comment: (NOTE) If result is NEGATIVE SARS-CoV-2 target nucleic acids are NOT DETECTED. The SARS-CoV-2 RNA is generally detectable in upper and lower  respiratory specimens during the acute phase of infection. The lowest  concentration of SARS-CoV-2 viral copies this assay can detect is 250  copies / mL. A negative result does not preclude SARS-CoV-2 infection  and should not be used as the sole basis for treatment or other  patient management decisions.  A negative result may occur with  improper specimen collection / handling, submission of specimen other  than nasopharyngeal swab, presence of viral mutation(s) within the  areas targeted by this assay, and inadequate number of viral copies  (<250 copies / mL). A negative result must be combined with clinical  observations, patient history, and epidemiological information. If result is POSITIVE SARS-CoV-2 target nucleic acids are DETECTED. The  SARS-CoV-2 RNA is generally detectable in upper and lower  respiratory specimens dur ing the acute phase of infection.  Positive  results are indicative of active infection with SARS-CoV-2.  Clinical  correlation with patient history and other diagnostic information is  necessary to determine patient infection status.  Positive results do  not rule out bacterial infection or co-infection with other viruses. If result is PRESUMPTIVE POSTIVE SARS-CoV-2 nucleic acids MAY BE PRESENT.   A presumptive positive result was obtained on the submitted specimen  and confirmed on repeat testing.  While 2019 novel coronavirus  (SARS-CoV-2) nucleic acids may be present in the submitted sample  additional confirmatory testing may be necessary for epidemiological  and / or clinical management purposes  to differentiate between  SARS-CoV-2 and other Sarbecovirus currently known to infect humans.  If clinically indicated additional testing with an alternate test  methodology [phone]) is advised. The SARS-CoV-2 RNA is generally  detectable in upper and lower respiratory sp ecimens during the acute  phase of infection. The expected result is Negative. Fact Sheet for Patients:  StrictlyIdeas.no Fact Sheet for Healthcare Providers: BankingDealers.co.za This test is not yet approved or cleared by the [place] FDA and has been authorized for detection and/or diagnosis of SARS-CoV-2 by FDA under an Emergency Use Authorization (EUA).  This EUA will remain in effect (meaning this test can be used) for the duration of the COVID-19 declaration under Section 564(b)(1) of the Act, 21 U.S.C. section 360bbb-3(b)(1), unless the authorization is terminated or revoked sooner. Performed at [facility], [place] [address] [place], [place] [address]   Blood Culture (routine x 2)     Status: None   Collection Time: 01/23/19  4:54  PM   Specimen: BLOOD RIGHT HAND   Result Value Ref Range Status   Specimen Description BLOOD RIGHT HAND  Final   Special Requests   Final    BOTTLES DRAWN AEROBIC AND ANAEROBIC Blood Culture results may not be optimal due to an inadequate volume of blood received in culture bottles   Culture   Final    NO GROWTH 5 DAYS Performed at [facility], [place] [address] [place], [place] [address]    Report Status 01/28/2019 FINAL  Final  Blood Culture (routine x 2)     Status: None   Collection Time: 01/23/19  4:59 PM   Specimen: BLOOD  Result Value Ref Range Status   Specimen Description BLOOD RIGHT ANTECUBITAL  Final   Special Requests   Final    BOTTLES DRAWN AEROBIC AND ANAEROBIC Blood Culture adequate volume   Culture   Final    NO GROWTH 5 DAYS Performed at [facility], [place] [address] [place], [place] [address]    Report Status 01/28/2019 FINAL  Final  Urine culture     Status: Abnormal   Collection Time: 01/23/19  7:10 PM   Specimen: Urine, Clean Catch  Result Value Ref Range Status   Specimen Description URINE, CLEAN CATCH  Final   Special Requests NONE  Final   Culture (A)  Final    <10,000 COLONIES/mL INSIGNIFICANT GROWTH Performed at [facility], [place] [address] [place], [place] [address]    Report Status 01/24/2019 FINAL  Final     Labs: Basic Metabolic Panel: No results for input(s): NA, K, CL, CO2, GLUCOSE, BUN, CREATININE, CALCIUM, MG, PHOS in the last 168 hours. Liver Function Tests: No results for input(s): AST, ALT, ALKPHOS, BILITOT, PROT, ALBUMIN in the last 168 hours. No results for input(s): LIPASE, AMYLASE in the last 168 hours. No results for input(s): AMMONIA in the last 168 hours. CBC: No results for input(s): WBC, NEUTROABS, HGB, HCT, MCV, PLT in the last 168 hours. Cardiac Enzymes: No results for input(s): CKTOTAL, CKMB, CKMBINDEX, TROPONINI in the last 168 hours. BNP: BNP (last 3 results) No results for input(s): BNP in the last 8760 hours.  ProBNP (last 3  results) No results for input(s): PROBNP in the last 8760 hours.  CBG: Recent Labs  Lab 01/24/19 2131  GLUCAP 137*       Signed:  Nita Sells MD   [facility] 01/31/2019, 4:09 PM

## 2019-02-01 ENCOUNTER — Encounter (HOSPITAL_COMMUNITY)
Admission: RE | Admit: 2019-02-01 | Discharge: 2019-02-01 | Disposition: A | Discharge status: Home / Self Care | Payer: 59 | Source: Ambulatory Visit | Attending: Hematology and Oncology | Admitting: Hematology and Oncology

## 2019-02-01 ENCOUNTER — Other Ambulatory Visit: Payer: Self-pay

## 2019-02-01 DIAGNOSIS — R911 Solitary pulmonary nodule: Secondary | ICD-10-CM

## 2019-02-01 LAB — GLUCOSE, CAPILLARY: Glucose-Capillary: 146 mg/dL — ABNORMAL HIGH (ref 70–99)

## 2019-02-01 MED ORDER — FLUDEOXYGLUCOSE F - 18 (FDG) INJECTION
8.6100 | Freq: Once | INTRAVENOUS | Status: AC | PRN
  Administered 2019-02-01: 8.61 via INTRAVENOUS

## 2019-02-02 NOTE — Progress Notes (Signed)
Patient Care Team: Patient, No Pcp Per as PCP - General (General Practice)  DIAGNOSIS:    ICD-10-CM   1. Mass of left lung  R91.8 Ambulatory referral to ENT    Ambulatory referral to Radiation Oncology  2. Neck mass  R22.1 Ambulatory referral to ENT    Ambulatory referral to Radiation Oncology    CHIEF COMPLIANT: Follow-up after recent hospitalization to review PET scan  INTERVAL HISTORY: [name] is a 63 y.o. who was recently admitted to the hospital for lightheadedness and dizziness. CT scans revealed a 5.4cm left upper lobe mass, suspicious for primary lung malignancy, a 24mm perifissural nodule in the left lower lobe, and a 62mm left percarinal lymph node. PET scan on 02/01/19 showed a hypermetabolic left upper lobe nodule concerning for bronchogenic carcinoma, a hypermetabolic ipsilateral left lower paratracheal metastatic node, and a right ear lesions concerning for squamous cell carcinoma metastatic node versus primary parotid node neoplasm. He presents to the clinic today to review the PET scan and discuss treatment.   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  I have reviewed the past medical history, past surgical history, social history and  family history with the patient and they are unchanged from previous note.  ALLERGIES:  is allergic to adhesive [tape]; bactrim ds [sulfamethoxazole-trimethoprim]; and trental [pentoxifylline].  MEDICATIONS:  Current Outpatient Medications  Medication Sig Dispense Refill  . aspirin 81 MG EC tablet Take 1 tablet (81 mg total) by mouth daily. 30 tablet 3  . clopidogrel (PLAVIX) 75 MG tablet Take 1 tablet (75 mg total) by mouth daily with breakfast. 30 tablet 3  . gabapentin (NEURONTIN) 100 MG capsule Take 1-2 capsules (100-200 mg total) by mouth See admin instructions. Take 100 mg by mouth in the morning and 200 mg at bedtime 60 capsule 2  . glipiZIDE (GLUCOTROL) 5 MG tablet Take 2 tablets (10 mg total) by mouth daily before breakfast. 30 tablet 3  . HYDROcodone-acetaminophen (NORCO) 5-325 MG tablet Take 1 tablet by mouth every 6 (six) hours as needed for up to 12 doses for moderate pain. 12 tablet 0  . rosuvastatin (CRESTOR) 10 MG tablet Take 1 tablet (10 mg total) by mouth daily at 6 PM. 30 tablet 3   No current facility-administered medications for this visit.     PHYSICAL EXAMINATION: ECOG PERFORMANCE STATUS: 1 - Symptomatic but completely ambulatory  Vitals:   02/03/19 1525  BP: 126/70  Pulse: 83  Resp: 17  Temp: 98.6 F (37 C)   Filed Weights    GENERAL: alert, no distress and comfortable SKIN: skin color, texture, turgor are normal, no rashes or significant lesions EYES: normal, Conjunctiva are pink and non-injected, sclera clear OROPHARYNX: no exudate, no erythema and lips, buccal mucosa, and tongue normal  NECK: supple, thyroid normal size, non-tender, without nodularity LYMPH: no palpable lymphadenopathy in the cervical, axillary  or inguinal LUNGS: clear to auscultation and percussion with normal breathing effort HEART: regular rate & rhythm and no murmurs and no lower extremity edema ABDOMEN: abdomen soft, non-tender and normal bowel sounds MUSCULOSKELETAL: no  cyanosis of digits and no clubbing  NEURO: alert & oriented x 3 with fluent speech, no focal motor/sensory deficits EXTREMITIES: No lower extremity edema  LABORATORY DATA:  I have reviewed the data as listed CMP Latest Ref Rng & Units 01/23/2019 11/12/2018 09/22/2018  Glucose 70 - 99 mg/dL 167(H) 178(H) 144(H)  BUN 8 - 23 mg/dL 9 - 11  Creatinine 0.61 - 1.24 mg/dL 1.11 - 0.83  Sodium 135 - 145 mmol/L 139 139 137  Potassium 3.5 - 5.1 mmol/L 3.8 4.1 4.7  Chloride 98 - 111 mmol/L 105 - 100  CO2 22 - 32 mmol/L 24 - 28  Calcium 8.9 - 10.3 mg/dL 9.2 - 9.7  Total Protein 6.5 - 8.1 g/dL 6.2(L) - -  Total Bilirubin 0.3 - 1.2 mg/dL 0.7 - -  Alkaline Phos 38 - 126 U/L 82 - -  AST 15 - 41 U/L 14(L) - -  ALT 0 - 44 U/L 9 - -    Lab Results  Component Value Date   WBC 10.6 (H) 01/23/2019   HGB 13.5 01/23/2019   HCT 41.5 01/23/2019   MCV 95.6 01/23/2019   PLT 246 01/23/2019   NEUTROABS 6.4 01/23/2019    ASSESSMENT & PLAN:  Mass of left lung CT chest 01/23/2019:5.4 cm left anterior upper lobe mass highly suspicious for primary lung malignancy. 7 mm circumscribed perifissural nodule in the superior segment of the left lower lobe may represent intraparenchymal lymph node. 9 mm left precarinal lymph node, indeterminate.  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 He wants to see ENT to see if it can be removed.  It is bothering him significantly.  I requested our head and neck navigator to refer the patient to Dr. Constance Holster.   Neck mass PET CT scan 02/01/2019: 3 cm hypermetabolic necrotic lesion anterior to the right ear concerning for metastatic squamous cell carcinoma versus parotid neoplasm, hypermetabolic left upper lobe pulmonary nodule 2.1 cm SUV 14.7 concerning for primary bronchogenic carcinoma left lower paratracheal lymph node 6 mm SUV 4.5  Recommendation: Ideally we need to biopsy the lung nodule but the patient will not undergo any needle  biopsies.  He will also not undergo any surgeries on his lung. The next best approach is to consider radiation to the lung. I discussed with him the risks and benefits of radiation and I will make a referral to radiation oncology.  It is possible that the patient may have a parotid tumor and a primary bronchogenic carcinoma.  However he will not undergo lung biopsy at any cost.  I discussed with him that he may also have a paratracheal lymph node which could be metastatic disease.  He tells me that he would rather die than get any kind of biopsies or surgeries.  I am available to see him on an as-needed basis.    Orders Placed This Encounter  Procedures  . Ambulatory referral to ENT    Referral Priority:   Routine    Referral Type:   Consultation    Referral Reason:   Specialty Services Required    Referred to Provider:   Izora Gala, MD    Requested Specialty:   Otolaryngology    Number of Visits Requested:  1  . Ambulatory referral to Radiation Oncology    Referral Priority:   Routine    Referral Type:   Consultation    Referral Reason:   Specialty Services Required    Referred to Provider:   Eppie Gibson, MD    Requested Specialty:   Radiation Oncology    Number of Visits Requested:   1   The patient has a good understanding of the overall plan. he agrees with it. he will call with any problems that may develop before the next visit here.  Devin Lose, MD 02/03/2019  Julious Oka Dorshimer am acting as scribe for Dr. Nicholas Fischer.  I have reviewed the above documentation for accuracy and completeness, and I agree with the above.

## 2019-02-03 ENCOUNTER — Encounter: Payer: Self-pay | Admitting: *Deleted

## 2019-02-03 ENCOUNTER — Other Ambulatory Visit: Payer: Self-pay

## 2019-02-03 ENCOUNTER — Telehealth: Payer: Self-pay | Admitting: Radiation Oncology

## 2019-02-03 ENCOUNTER — Inpatient Hospital Stay: Payer: Medicare Other | Attending: Hematology and Oncology | Admitting: Hematology and Oncology

## 2019-02-03 ENCOUNTER — Telehealth: Payer: Self-pay | Admitting: *Deleted

## 2019-02-03 DIAGNOSIS — R221 Localized swelling, mass and lump, neck: Secondary | ICD-10-CM | POA: Insufficient documentation

## 2019-02-03 DIAGNOSIS — Z79899 Other long term (current) drug therapy: Secondary | ICD-10-CM | POA: Diagnosis not present

## 2019-02-03 DIAGNOSIS — Z7982 Long term (current) use of aspirin: Secondary | ICD-10-CM | POA: Insufficient documentation

## 2019-02-03 DIAGNOSIS — R918 Other nonspecific abnormal finding of lung field: Secondary | ICD-10-CM | POA: Insufficient documentation

## 2019-02-03 NOTE — Assessment & Plan Note (Signed)
PET CT scan 02/01/2019: 3 cm hypermetabolic necrotic lesion anterior to the right ear concerning for metastatic squamous cell carcinoma versus parotid neoplasm, hypermetabolic left upper lobe pulmonary nodule 2.1 cm SUV 14.7 concerning for primary bronchogenic carcinoma left lower paratracheal lymph node 6 mm SUV 4.5  Recommendation: 1.  Biopsy of the parotid mass 2.  Biopsy of the lung nodule  It is possible that the patient may have a parotid tumor and a primary bronchogenic carcinoma. However the patient is extremely terrified of needles and does not want to do these biopsies.

## 2019-02-03 NOTE — Progress Notes (Signed)
I called to update patient on referral to thoracic surgery.  He was yelling and upset.  I listened as he explained.  Patient would not let me explain further.  He will speak to Dr. Lindi Adie today about his scan.  I will update Dr. Lindi Adie.

## 2019-02-03 NOTE — Telephone Encounter (Signed)
New message:    Called patient to set up an appt from referral received. Patient asked that I call him back on Monday to set up this appt.

## 2019-02-07 ENCOUNTER — Telehealth: Payer: Self-pay

## 2019-02-08 ENCOUNTER — Ambulatory Visit: Payer: 59 | Admitting: Family Medicine

## 2019-02-08 NOTE — Progress Notes (Signed)
Thoracic Location of Tumor / Histology:  Left upper lobe of lung  Patient presented with symptoms of: he was admitted to the hospital for lightheadedness and dizziness. A CT scan performed revealed a left upper lobe mass.   Biopsies of revealed: No biopsy performed. Patient has refused biopsy per Dr. Geralyn Flash note  Tobacco/Marijuana/Snuff/ETOH use: He is a former smoker quitting 06/15/2018. He uses marijuana daily. He has used smokeless tobacco. He does not drink alcohol.   Past/Anticipated interventions by cardiothoracic surgery, if any:  None  Past/Anticipated interventions by medical oncology, if any:  Dr. Lindi Adie 02/03/19 ASSESSMENT & PLAN:  Mass of left lung CT chest 01/23/2019:5.4 cm left anterior upper lobe mass highly suspicious for primary lung malignancy. 7 mm circumscribed perifissural nodule in the superior segment of the left lower lobe may represent intraparenchymal lymph node. 9 mm left precarinal lymph node, indeterminate. 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 He wants to see ENT to see if it can be removed.  It is bothering him significantly.  I requested our head and neck navigator to refer the patient to Dr. Constance Holster. Neck mass PET CT scan 02/01/2019: 3 cm hypermetabolic necrotic lesion anterior to the right ear concerning for metastatic squamous cell carcinoma versus parotid neoplasm, hypermetabolic left upper lobe pulmonary nodule 2.1 cm SUV 14.7 concerning for primary bronchogenic carcinoma left lower paratracheal lymph node 6 mm SUV 4.5 Recommendation: Ideally we need to biopsy the lung nodule but the patient will not undergo any needle biopsies.  He will also not undergo any surgeries on his lung. The next best approach is to consider radiation to the lung. I discussed with him the risks and benefits of radiation and I will make a referral to radiation oncology. It is possible that the patient may have a parotid tumor and a  primary bronchogenic carcinoma.  However he will not undergo lung biopsy at any cost.  I discussed with him that he may also have a paratracheal lymph node which could be metastatic disease.  He tells me that he would rather die than get any kind of biopsies or surgeries.  I am available to see him on an as-needed basis.  Signs/Symptoms  Weight changes, if any: He has lost weight related to pain to the right side of his face.   Respiratory complaints, if any:   Hemoptysis, if any:   Pain issues, if any:  He reports pain a 12/10 to the right side of his face. It it painful to swallow, talk, and put pressure to the area.   SAFETY ISSUES:  Prior radiation? No  Pacemaker/ICD? No  Possible current pregnancy? N/A  Is the patient on methotrexate? No  Current Complaints / other details:   A referral is being place to Dr. Constance Holster (ENT) to discuss the lesion anterior to the right ear.  He does not understand why he needs to see an ENT to discuss the cancer to his right facial area.   BP 137/79 (BP Location: Left Arm, Patient Position: Sitting)   Pulse 79   Temp 98.7 F (37.1 C) (Temporal)   Resp 18   Ht 5\' 8"  (1.727 m)   Wt 159 lb (72.1 kg)   SpO2 100%   BMI 24.18 kg/m    Wt Readings from Last 3 Encounters:  02/11/19 159 lb (72.1 kg)  01/28/19 175 lb (79.4 kg)  01/24/19 175 lb (79.4 kg)

## 2019-02-09 ENCOUNTER — Telehealth: Payer: Self-pay | Admitting: *Deleted

## 2019-02-09 NOTE — Telephone Encounter (Signed)
Oncology Nurse Navigator Documentation  Placed introductory call to new referral patient.  [facility] on mobile with brief introduction, noted referrals place by Dr. Lindi Adie, asked for return call to coordinate RadOnc appt.  [name], RN, BSN Head & Neck Oncology Nurse [place] [facility] [place] [phone]

## 2019-02-10 ENCOUNTER — Telehealth: Payer: Self-pay | Admitting: *Deleted
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
## 2019-02-10 NOTE — Telephone Encounter (Signed)
Oncology Nurse Navigator Documentation  Placed new referral introductory call to pt, LVMM requesting return call.  [name], RN, BSN Head & Neck Oncology Nurse [place] [facility] [place] [phone]

## 2019-02-11 ENCOUNTER — Ambulatory Visit
Admission: RE | Admit: 2019-02-11 | Discharge: 2019-02-11 | Disposition: A | Discharge status: Home / Self Care | Payer: Medicare Other | Source: Ambulatory Visit | Attending: Radiation Oncology | Admitting: Radiation Oncology

## 2019-02-11 ENCOUNTER — Encounter: Payer: Self-pay | Admitting: *Deleted

## 2019-02-11 ENCOUNTER — Ambulatory Visit: Payer: 59 | Admitting: Physician Assistant

## 2019-02-11 ENCOUNTER — Other Ambulatory Visit: Payer: Self-pay

## 2019-02-11 ENCOUNTER — Encounter: Payer: Self-pay | Admitting: Radiation Oncology

## 2019-02-11 VITALS — BP 137/79 | HR 79 | Temp 98.7°F | Resp 18 | Ht 68.0 in | Wt 159.0 lb

## 2019-02-11 DIAGNOSIS — Z7982 Long term (current) use of aspirin: Secondary | ICD-10-CM | POA: Diagnosis not present

## 2019-02-11 DIAGNOSIS — Z87891 Personal history of nicotine dependence: Secondary | ICD-10-CM | POA: Insufficient documentation

## 2019-02-11 DIAGNOSIS — M542 Cervicalgia: Secondary | ICD-10-CM | POA: Insufficient documentation

## 2019-02-11 DIAGNOSIS — N400 Enlarged prostate without lower urinary tract symptoms: Secondary | ICD-10-CM | POA: Diagnosis not present

## 2019-02-11 DIAGNOSIS — R590 Localized enlarged lymph nodes: Secondary | ICD-10-CM | POA: Insufficient documentation

## 2019-02-11 DIAGNOSIS — R51 Headache: Secondary | ICD-10-CM | POA: Insufficient documentation

## 2019-02-11 DIAGNOSIS — Z85828 Personal history of other malignant neoplasm of skin: Secondary | ICD-10-CM | POA: Diagnosis not present

## 2019-02-11 DIAGNOSIS — R42 Dizziness and giddiness: Secondary | ICD-10-CM | POA: Diagnosis not present

## 2019-02-11 DIAGNOSIS — Z79899 Other long term (current) drug therapy: Secondary | ICD-10-CM | POA: Diagnosis not present

## 2019-02-11 DIAGNOSIS — C3412 Malignant neoplasm of upper lobe, left bronchus or lung: Secondary | ICD-10-CM

## 2019-02-11 DIAGNOSIS — C801 Malignant (primary) neoplasm, unspecified: Secondary | ICD-10-CM

## 2019-02-11 DIAGNOSIS — R918 Other nonspecific abnormal finding of lung field: Secondary | ICD-10-CM | POA: Insufficient documentation

## 2019-02-11 DIAGNOSIS — E119 Type 2 diabetes mellitus without complications: Secondary | ICD-10-CM | POA: Insufficient documentation

## 2019-02-11 DIAGNOSIS — C770 Secondary and unspecified malignant neoplasm of lymph nodes of head, face and neck: Secondary | ICD-10-CM

## 2019-02-14 NOTE — Progress Notes (Signed)
Oncology Nurse Navigator Documentation  Met with [name] during initial consult with Dr. Isidore Moos.  He was unaccompanied.    . Introduced myself as his Navigator, explained my role as a member of the Care Team.   . Provided New Patient Information packet, discussed contents: o Contact information for physician(s), myself, other members of the Care Team. o Advance Directive information ([facility] blue pamphlet with LCSW contact info); provided [facility] AD booklet at his request, encouraged him to contact Gwinda [place] LCSW to complete. o Fall Prevention Patient Safety Plan o Appointment Guideline o [place] o [place] campus map with highlight of [place] o SLP information sheet o Symptom Management Clinic information . Provided introductory explanation of radiation treatment including SIM planning and purpose of Aquaplast head and shoulder mask, showed him example.   . Of note: [name] Kitchen He is edentulous. . Will require [place] transportation service should he receive tmt here. [name] Kitchen Has not heard from ENT Rosen's office re appt. [name] Kitchen He voiced understanding of Dr. 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 I encouraged him to call me with questions/concerns as he continues with appts/procedures.  He voiced agreement.   [name], RN, BSN Head & Neck Oncology Nurse [place] at [place] [phone]

## 2019-02-15 ENCOUNTER — Telehealth: Payer: Self-pay | Admitting: *Deleted

## 2019-02-15 NOTE — Telephone Encounter (Signed)
Oncology Nurse Navigator Documentation  Spoke with [place].  She confirmed pt has appt with ENT Dr. Constance Holster 7/20.  [name], RN, BSN Head & Neck Oncology Nurse [place] at [place] [phone]

## 2019-02-16 ENCOUNTER — Encounter: Payer: Self-pay | Admitting: Radiation Oncology

## 2019-02-16 DIAGNOSIS — C770 Secondary and unspecified malignant neoplasm of lymph nodes of head, face and neck: Secondary | ICD-10-CM | POA: Insufficient documentation

## 2019-02-16 DIAGNOSIS — C3412 Malignant neoplasm of upper lobe, left bronchus or lung: Secondary | ICD-10-CM | POA: Insufficient documentation

## 2019-02-17 NOTE — Telephone Encounter (Signed)
Note not needed 

## 2019-02-18 ENCOUNTER — Encounter: Payer: Self-pay | Admitting: Physician Assistant

## 2019-02-18 ENCOUNTER — Ambulatory Visit (INDEPENDENT_AMBULATORY_CARE_PROVIDER_SITE_OTHER): Payer: Medicare Other | Admitting: Physician Assistant

## 2019-02-18 VITALS — Ht 68.0 in | Wt 159.0 lb

## 2019-02-18 DIAGNOSIS — E1142 Type 2 diabetes mellitus with diabetic polyneuropathy: Secondary | ICD-10-CM

## 2019-02-18 DIAGNOSIS — Z89431 Acquired absence of right foot: Secondary | ICD-10-CM
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
## 2019-02-23 ENCOUNTER — Telehealth: Payer: Self-pay | Admitting: *Deleted

## 2019-02-23 ENCOUNTER — Encounter: Payer: Medicare Other | Admitting: Thoracic Surgery (Cardiothoracic Vascular Surgery)

## 2019-02-23 NOTE — Telephone Encounter (Signed)
Oncology Nurse Navigator Documentation  Called pt re cancellation of Monday's consult appt with ENT Constance Holster and to confirm his plans to keep 7/29 appt with Dr. Roxan Hockey.  LVMM asking for return call.  [name], RN, BSN Head & Neck Oncology Nurse [place] at [place] [phone]

## 2019-02-24 ENCOUNTER — Encounter: Payer: Self-pay | Admitting: Physician Assistant

## 2019-03-02 ENCOUNTER — Encounter: Payer: Medicare Other | Admitting: Thoracic Surgery (Cardiothoracic Vascular Surgery)

## 2019-03-10 NOTE — H&P (Signed)
HPI:   [name] is a 63 y.o. male who presents as a consult Patient.   Referring Provider: Self, A Referral  Chief complaint: Parotid mass.  HPI: History of squamous cell cancer of the right temple region removed a few years ago by dermatology. A few months ago he developed a lump in the preauricular area on the same side, the right side. It is extremely painful and has gotten larger. He had a biopsy which revealed squamous cell carcinoma. During the work-up the PET scan revealed a left lung nodule and mediastinal nodes. This is a presumptive second primary. He quit smoking recently.  PMH/Meds/All/SocHx/FamHx/ROS:   Past Medical History:  Diagnosis Date  . Diabetes mellitus ([place])  . High cholesterol   Past Surgical History:  Procedure Laterality Date  . excision of scalp mass  . FOOT AMPUTATION   No family history of bleeding disorders, wound healing problems or difficulty with anesthesia.   Social History   Socioeconomic History  . Marital status: Unknown  Spouse name: Not on file  . Number of children: Not on file  . Years of education: Not on file  . Highest education level: Not on file  Occupational History  . Not on file  Social Needs  . Financial resource strain: Not on file  . Food insecurity  Worry: Not on file  Inability: Not on file  . Transportation needs  Medical: Not on file  Non-medical: Not on file  Tobacco Use  . Smoking status: Former Research scientist (life sciences)  . Smokeless tobacco: Former Network engineer and Sexual Activity  . Alcohol use: Not on file  . Drug use: Not on file  . Sexual activity: Not on file  Lifestyle  . Physical activity  Days per week: Not on file  Minutes per session: Not on file  . Stress: Not on file  Relationships  . Social Medical illustrator on phone: Not on file  Gets together: Not on file  Attends religious service: Not on file  Active member of club or organization: Not on file  Attends meetings of clubs or organizations: Not  on file  Relationship status: Not on file  Other Topics Concern  . Not on file  Social History Narrative  . Not on file   Current Outpatient Medications:  . aspirin 81 MG EC tablet *ANTIPLATELET*, Take 81 mg by mouth., Disp: , Rfl:  . clopidogreL (PLAVIX) 75 mg tablet *ANTIPLATELET*, Take 75 mg by mouth., Disp: , Rfl:  . gabapentin (NEURONTIN) 100 MG capsule, Take 100-200 mg by mouth., Disp: , Rfl:  . glipiZIDE (GLUCOTROL) 5 MG tablet, Take 10 mg by mouth., Disp: , Rfl:  . rosuvastatin (CRESTOR) 10 MG tablet, Take 10 mg by mouth., Disp: , Rfl:   A complete ROS was performed with pertinent positives/negatives noted in the HPI. The remainder of the ROS are negative.   Physical Exam:   Ht 1.727 m (5\' 8" )  Wt 73.5 kg (162 lb)  BMI 24.63 kg/m   General: Thin gentleman, in no distress, breathing easily. Normal affect. In a pleasant mood. Head: Normocephalic, atraumatic. No masses, or scars. Eyes: Pupils are equal, and reactive to light. Vision is grossly intact. No spontaneous or gaze nystagmus. Ears: Ear canals are clear. Tympanic membranes are intact, with normal landmarks and the middle ears are clear and healthy. Hearing: Grossly normal. Nose: Nasal cavities are clear with healthy mucosa, no polyps or exudate. Airways are patent. Face: Large subdermal mass right preauricular area approximately 5 cm  in diameter. It is very tender, it is not ulcerating through the skin at this time, facial nerve function is symmetric. Old surgical site right temple area several centimeters above this lesion is well-healed. It appears there was a skin graft. Oral Cavity: No mucosal abnormalities are noted. Tongue with normal mobility. He is edentulous. Oropharynx: Tonsils are symmetric. There are no mucosal masses identified. Tongue base appears normal and healthy. Larynx/Hypopharynx: deferred Chest: Deferred Neck: No palpable masses, no cervical adenopathy, no thyroid nodules or enlargement. Neuro:  Cranial nerves II-XII with normal function. Balance: Normal gate. Other findings: none.  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 He is in exquisite pain from this. We will discuss him at tumor board tomorrow. Recommend parotidectomy, removal of skin, primary closure, attempted facial nerve dissection although we discussed that there is a chance that we will have to sacrifice either some or all branches of the facial nerve. He will certainly need postop radiation. All questions were answered and he understands all of this.

## 2019-03-11 NOTE — Pre-Procedure Instructions (Signed)
MEARL OLVER  03/11/2019     [place], [address] [place] [address] Phone: [phone] Fax: [phone]   Your procedure is scheduled on Wednesday, March 16, 2019  Report to [facility] Admitting at 6:30 A.M.  Call this number if you have problems the morning of surgery:  [phone]   Remember: Brush your teeth the morning of surgery with your regular toothpaste.  Do not eat or drink after midnight Tuesday, March 15, 2019  Take these medicines the morning of surgery with A SIP OF WATER : gabapentin (NEURONTIN)  Stop taking  vitamins, fish oil and herbal medications. Do not take any NSAIDs ie: Ibuprofen, Advil, Naproxen (Aleve), Motrin, BC and Goody Powder; stop now.  Follow surgeon's instructions regarding Aspirin and clopidogrel (PLAVIX), if no pre-op instructions were provided, please call surgeon's office.    How to Manage Your Diabetes Before and After Surgery  Why is it important to control my blood sugar before and after surgery?  Improving blood sugar levels before and after surgery helps healing and can limit problems.  A way of improving blood sugar control is eating a healthy diet by: o  Eating less sugar and carbohydrates o  Increasing activity/exercise o  Talking with your doctor about reaching your blood sugar goals  High blood sugars (greater than 180 mg/dL) can raise your risk of infections and slow your recovery, so you will need to focus on controlling your diabetes during the weeks before surgery.  Make sure that the doctor who takes care of your diabetes knows about your planned surgery including the date and location.  How do I manage my blood sugar before surgery?  Check your blood sugar at least 4 times a day, starting 2 days before surgery, to make sure that the level is not too high or low. o Check your blood sugar the morning of your surgery when you wake up and  every 2 hours until you get to the Short Stay unit.  If your blood sugar is less than 70 mg/dL, you will need to treat for low blood sugar: o Treat a low blood sugar (less than 70 mg/dL) with  cup of clear juice (cranberry or apple), 4 glucose tablets, OR glucose gel. Recheck blood sugar in 15 minutes after treatment (to make sure it is greater than 70 mg/dL). If your blood sugar is not greater than 70 mg/dL on recheck, call [phone] o  for further instructions.  Report your blood sugar to the short stay nurse when you get to Short Stay.   If you are admitted to the hospital after surgery: o Your blood sugar will be checked by the staff and you will probably be given insulin after surgery (instead of oral diabetes medicines) to make sure you have good blood sugar levels. o The goal for blood sugar control after surgery is 80-180 mg/dL.  WHAT DO I DO ABOUT MY DIABETES MEDICATION?   Do not take oral diabetes medicines (pills) the morning of surgery such as glipiZIDE (GLUCOTROL) Reviewed and Endorsed by [facility] Patient Education Committee, August 2015    Do not wear jewelry, make-up or nail polish.  Do not wear lotions, powders, or perfumes, or deodorant.  Do not shave 48 hours prior to surgery.  Men may shave face and neck.  Do not bring valuables to the hospital.  [facility] is not responsible for any belongings or valuables.  Contacts, dentures or bridgework may not be worn into surgery.  Leave your suitcase in the car.  After surgery it may be brought to your room. For patients admitted to the hospital, discharge time will be determined by your treatment team.  Patients discharged the day of surgery will not be allowed to drive home.   Special instructions: Shower the night before and morning of surgery with CHG. Please read over the following fact sheets that you were given. Pain Booklet, Coughing and Deep Breathing and Surgical Site Infection Prevention

## 2019-03-14 ENCOUNTER — Other Ambulatory Visit (HOSPITAL_COMMUNITY)
Admission: RE | Admit: 2019-03-14 | Discharge: 2019-03-14 | Disposition: A | Discharge status: Home / Self Care | Payer: Medicare Other | Source: Ambulatory Visit | Attending: Otolaryngology | Admitting: Otolaryngology

## 2019-03-14 ENCOUNTER — Other Ambulatory Visit: Payer: Self-pay

## 2019-03-14 ENCOUNTER — Encounter (HOSPITAL_COMMUNITY): Payer: Self-pay

## 2019-03-14 ENCOUNTER — Encounter (HOSPITAL_COMMUNITY)
Admission: RE | Admit: 2019-03-14 | Discharge: 2019-03-14 | Disposition: A | Discharge status: Home / Self Care | Payer: Medicare Other | Source: Ambulatory Visit | Attending: Otolaryngology | Admitting: Otolaryngology

## 2019-03-14 DIAGNOSIS — Z87891 Personal history of nicotine dependence: Secondary | ICD-10-CM | POA: Diagnosis not present

## 2019-03-14 DIAGNOSIS — M1990 Unspecified osteoarthritis, unspecified site: Secondary | ICD-10-CM | POA: Diagnosis not present

## 2019-03-14 DIAGNOSIS — Z20828 Contact with and (suspected) exposure to other viral communicable diseases: Secondary | ICD-10-CM | POA: Diagnosis not present

## 2019-03-14 DIAGNOSIS — Z79899 Other long term (current) drug therapy: Secondary | ICD-10-CM | POA: Diagnosis not present

## 2019-03-14 DIAGNOSIS — E7800 Pure hypercholesterolemia, unspecified: Secondary | ICD-10-CM | POA: Diagnosis not present

## 2019-03-14 DIAGNOSIS — R911 Solitary pulmonary nodule: Secondary | ICD-10-CM | POA: Diagnosis not present

## 2019-03-14 DIAGNOSIS — Z89439 Acquired absence of unspecified foot: Secondary | ICD-10-CM | POA: Diagnosis not present

## 2019-03-14 DIAGNOSIS — C7989 Secondary malignant neoplasm of other specified sites: Secondary | ICD-10-CM | POA: Diagnosis not present

## 2019-03-14 DIAGNOSIS — E119 Type 2 diabetes mellitus without complications: Secondary | ICD-10-CM | POA: Diagnosis not present

## 2019-03-14 DIAGNOSIS — Z7982 Long term (current) use of aspirin: Secondary | ICD-10-CM | POA: Diagnosis not present

## 2019-03-14 DIAGNOSIS — I739 Peripheral vascular disease, unspecified: Secondary | ICD-10-CM | POA: Diagnosis not present

## 2019-03-14 DIAGNOSIS — Z85828 Personal history of other malignant neoplasm of skin: Secondary | ICD-10-CM | POA: Diagnosis not present

## 2019-03-14 DIAGNOSIS — Z7984 Long term (current) use of oral hypoglycemic drugs: Secondary | ICD-10-CM | POA: Diagnosis not present

## 2019-03-14 LAB — BASIC METABOLIC PANEL
Anion gap: 10 (ref 5–15)
BUN: 7 mg/dL — ABNORMAL LOW (ref 8–23)
CO2: 25 mmol/L (ref 22–32)
Calcium: 9.2 mg/dL (ref 8.9–10.3)
Chloride: 103 mmol/L (ref 98–111)
Creatinine, Ser: 0.6 mg/dL — ABNORMAL LOW (ref 0.61–1.24)
GFR calc Af Amer: >60 mL/min (ref >60)
GFR calc non Af Amer: >60 mL/min (ref >60)
Glucose, Bld: 156 mg/dL — ABNORMAL HIGH (ref 70–99)
Potassium: 4.1 mmol/L (ref 3.5–5.1)
Sodium: 138 mmol/L (ref 135–145)

## 2019-03-14 LAB — CBC
HCT: 42.8 % (ref 39.0–52.0)
Hemoglobin: 14.3 g/dL (ref 13.0–17.0)
MCH: 31.2 pg (ref 26.0–34.0)
MCHC: 33.4 g/dL (ref 30.0–36.0)
MCV: 93.2 fL (ref 80.0–100.0)
Platelets: 305 10*3/uL (ref 150–400)
RBC: 4.59 MIL/uL (ref 4.22–5.81)
RDW: 13.9 % (ref 11.5–15.5)
WBC: 11.7 10*3/uL — ABNORMAL HIGH (ref 4.0–10.5)
nRBC: 0 % (ref 0.0–0.2)

## 2019-03-14 LAB — HEMOGLOBIN A1C
Hgb A1c MFr Bld: 6.6 % — ABNORMAL HIGH (ref 4.8–5.6)
Mean Plasma Glucose: 142.72 mg/dL

## 2019-03-14 LAB — GLUCOSE, CAPILLARY: Glucose-Capillary: 188 mg/dL — ABNORMAL HIGH (ref 70–99)

## 2019-03-14 LAB — SARS CORONAVIRUS 2 (TAT 6-24 HRS): SARS Coronavirus 2: NEGATIVE

## 2019-03-14 NOTE — Progress Notes (Signed)
Pt denies SOB, chest pain, and being under the care of a cardiologist.  Pt denies having a PCP.  Pt denies having a stress test and cardiac cath.  Pt stated that he was not provided with pre-op instructions for Aspirin and Plavix  Pt stated " I stopped taking them last week "  Nurse lvm with Tracey, Surgical Coordinator, to follow-up with pt regarding pre-op instructions for Aspirin and Plavix .  Pt denies having an A1c in the last 60 days and recent labs.   Pt denies that he and family members tested positive for COVID-19; pt tested today and reminded to quarantine.  Coronavirus Screening  Pt denies that he and his spouse experienced the following symptoms:  Cough yes/no: No Fever (>100.29F)  yes/no: No Runny nose yes/no: No Sore throat yes/no: No Difficulty breathing/shortness of breath  yes/no: No  Pt reminded that hospital visitation restrictions are in effect and the importance of the restrictions.   Pt verbalized under standing of all pre-op instructions.

## 2019-03-15 NOTE — Anesthesia Preprocedure Evaluation (Addendum)
Anesthesia Evaluation  Patient identified by MRN, date of birth, ID band Patient awake    Reviewed: Allergy & Precautions, NPO status , Patient's Chart, lab work & pertinent test results  History of Anesthesia Complications Negative for: history of anesthetic complications  Airway Mallampati: II  TM Distance: >3 FB Neck ROM: Full    Dental  (+) Edentulous Upper, Edentulous Lower, Dental Advisory Given   Pulmonary COPD, former smoker,    breath sounds clear to auscultation       Cardiovascular (-) hypertension(-) angina+ Peripheral Vascular Disease   Rhythm:Regular Rate:Normal  '13 ECHO: EF 55-60%, valves OK   Neuro/Psych negative neurological ROS     GI/Hepatic negative GI ROS, Neg liver ROS,   Endo/Other  diabetes  Renal/GU negative Renal ROS     Musculoskeletal  (+) Arthritis ,   Abdominal   Peds  Hematology negative hematology ROS (+)   Anesthesia Other Findings   Reproductive/Obstetrics                            Anesthesia Physical  Anesthesia Plan  ASA: III  Anesthesia Plan: General   Post-op Pain Management:    Induction:   PONV Risk Score and Plan: 3 and Ondansetron, Dexamethasone and Midazolam  Airway Management Planned: Oral ETT  Additional Equipment:   Intra-op Plan:   Post-operative Plan: Extubation in OR  Informed Consent: I have reviewed the patients History and Physical, chart, labs and discussed the procedure including the risks, benefits and alternatives for the proposed anesthesia with the patient or authorized representative who has indicated his/her understanding and acceptance.     Dental advisory given  Plan Discussed with: Anesthesiologist  Anesthesia Plan Comments:        Anesthesia Quick Evaluation

## 2019-03-16 ENCOUNTER — Ambulatory Visit (HOSPITAL_COMMUNITY): Payer: Medicare Other | Admitting: Anesthesiology

## 2019-03-16 ENCOUNTER — Encounter (HOSPITAL_COMMUNITY): Payer: Self-pay | Admitting: Certified Registered Nurse Anesthetist

## 2019-03-16 ENCOUNTER — Encounter (HOSPITAL_COMMUNITY)
Admission: RE | Disposition: A | Discharge status: Home / Self Care | Payer: Self-pay | Source: Home / Self Care | Attending: Otolaryngology

## 2019-03-16 ENCOUNTER — Observation Stay (HOSPITAL_COMMUNITY)
Admission: RE | Admit: 2019-03-16 | Discharge: 2019-03-17 | Disposition: A | Discharge status: Home / Self Care | Payer: Medicare Other | Attending: Otolaryngology | Admitting: Otolaryngology

## 2019-03-16 ENCOUNTER — Other Ambulatory Visit: Payer: Self-pay

## 2019-03-16 DIAGNOSIS — Z7984 Long term (current) use of oral hypoglycemic drugs: Secondary | ICD-10-CM | POA: Insufficient documentation

## 2019-03-16 DIAGNOSIS — R911 Solitary pulmonary nodule: Secondary | ICD-10-CM | POA: Insufficient documentation

## 2019-03-16 DIAGNOSIS — C7989 Secondary malignant neoplasm of other specified sites: Principal | ICD-10-CM | POA: Insufficient documentation

## 2019-03-16 DIAGNOSIS — E119 Type 2 diabetes mellitus without complications: Secondary | ICD-10-CM | POA: Insufficient documentation

## 2019-03-16 DIAGNOSIS — Z85828 Personal history of other malignant neoplasm of skin: Secondary | ICD-10-CM | POA: Diagnosis not present

## 2019-03-16 DIAGNOSIS — Z20828 Contact with and (suspected) exposure to other viral communicable diseases: Secondary | ICD-10-CM | POA: Insufficient documentation

## 2019-03-16 DIAGNOSIS — Z79899 Other long term (current) drug therapy: Secondary | ICD-10-CM | POA: Insufficient documentation

## 2019-03-16 DIAGNOSIS — Z89439 Acquired absence of unspecified foot: Secondary | ICD-10-CM | POA: Insufficient documentation

## 2019-03-16 DIAGNOSIS — Z87891 Personal history of nicotine dependence: Secondary | ICD-10-CM | POA: Insufficient documentation

## 2019-03-16 DIAGNOSIS — E7800 Pure hypercholesterolemia, unspecified: Secondary | ICD-10-CM | POA: Insufficient documentation

## 2019-03-16 DIAGNOSIS — M1990 Unspecified osteoarthritis, unspecified site: Secondary | ICD-10-CM | POA: Insufficient documentation

## 2019-03-16 DIAGNOSIS — I739 Peripheral vascular disease, unspecified: Secondary | ICD-10-CM | POA: Insufficient documentation

## 2019-03-16 DIAGNOSIS — C07 Malignant neoplasm of parotid gland: Secondary | ICD-10-CM | POA: Diagnosis present

## 2019-03-16 DIAGNOSIS — Z7982 Long term (current) use of aspirin: Secondary | ICD-10-CM | POA: Insufficient documentation

## 2019-03-16 HISTORY — PX: SKIN FULL THICKNESS GRAFT: SHX442

## 2019-03-16 HISTORY — PX: PAROTIDECTOMY: SHX2163

## 2019-03-16 LAB — GLUCOSE, CAPILLARY
Glucose-Capillary: 161 mg/dL — ABNORMAL HIGH (ref 70–99)
Glucose-Capillary: 191 mg/dL — ABNORMAL HIGH (ref 70–99)
Glucose-Capillary: 309 mg/dL — ABNORMAL HIGH (ref 70–99)
Glucose-Capillary: 194 mg/dL — ABNORMAL HIGH (ref 70–99)

## 2019-03-16 SURGERY — EXCISION, PAROTID GLAND
Anesthesia: General | Site: Face | Laterality: Right

## 2019-03-16 MED ORDER — CELECOXIB 200 MG PO CAPS: 400.0000 mg | ORAL_CAPSULE | Freq: Once | ORAL | Status: DC

## 2019-03-16 MED ORDER — HYDROCODONE-ACETAMINOPHEN 5-325 MG PO TABS
1.0000 | ORAL_TABLET | ORAL | Status: DC | PRN
  Administered 2019-03-16 (×2): 2 via ORAL
  Administered 2019-03-17: 1 via ORAL
  Administered 2019-03-17: 2 via ORAL
  Administered 2019-03-17: 1 via ORAL
  Administered 2019-03-17 (×2): 2 via ORAL
  Filled 2019-03-16 (×6): qty 2
  Filled 2019-03-16: qty 1

## 2019-03-16 MED ORDER — GLIPIZIDE 5 MG PO TABS
10.0000 mg | ORAL_TABLET | Freq: Every day | ORAL | Status: DC
  Administered 2019-03-17: 10 mg via ORAL
  Filled 2019-03-16: qty 2

## 2019-03-16 MED ORDER — PROPOFOL 10 MG/ML IV BOLUS
INTRAVENOUS | Status: DC | PRN
  Administered 2019-03-16: 140 mg via INTRAVENOUS

## 2019-03-16 MED ORDER — BACITRACIN ZINC 500 UNIT/GM EX OINT
TOPICAL_OINTMENT | CUTANEOUS | Status: AC
  Filled 2019-03-16: qty 28.35

## 2019-03-16 MED ORDER — GABAPENTIN 100 MG PO CAPS: 100.0000 mg | ORAL_CAPSULE | ORAL | Status: DC

## 2019-03-16 MED ORDER — EPHEDRINE 5 MG/ML INJ
INTRAVENOUS | Status: AC
  Filled 2019-03-16: qty 10

## 2019-03-16 MED ORDER — ROSUVASTATIN CALCIUM 5 MG PO TABS
10.0000 mg | ORAL_TABLET | Freq: Every day | ORAL | Status: DC
  Administered 2019-03-16: 10 mg via ORAL
  Filled 2019-03-16: qty 2

## 2019-03-16 MED ORDER — BACITRACIN ZINC 500 UNIT/GM EX OINT
TOPICAL_OINTMENT | CUTANEOUS | Status: DC | PRN
  Administered 2019-03-16: 1 via TOPICAL

## 2019-03-16 MED ORDER — PROPOFOL 10 MG/ML IV BOLUS
INTRAVENOUS | Status: AC
  Filled 2019-03-16: qty 20

## 2019-03-16 MED ORDER — FENTANYL CITRATE (PF) 100 MCG/2ML IJ SOLN
INTRAMUSCULAR | Status: AC
  Filled 2019-03-16: qty 2

## 2019-03-16 MED ORDER — HYDROCODONE-ACETAMINOPHEN 5-325 MG PO TABS
1.0000 | ORAL_TABLET | ORAL | Status: DC | PRN
  Administered 2019-03-16: 2 via ORAL
  Filled 2019-03-16: qty 2

## 2019-03-16 MED ORDER — ROCURONIUM BROMIDE 10 MG/ML (PF) SYRINGE
PREFILLED_SYRINGE | INTRAVENOUS | Status: AC
  Filled 2019-03-16: qty 10

## 2019-03-16 MED ORDER — GABAPENTIN 100 MG PO CAPS
200.0000 mg | ORAL_CAPSULE | Freq: Every day | ORAL | Status: DC
  Administered 2019-03-16: 200 mg via ORAL
  Filled 2019-03-16: qty 2

## 2019-03-16 MED ORDER — POTASSIUM CHLORIDE 2 MEQ/ML IV SOLN
INTRAVENOUS | Status: DC
  Administered 2019-03-16 – 2019-03-17 (×2): via INTRAVENOUS
  Filled 2019-03-16 (×2): qty 1000

## 2019-03-16 MED ORDER — ASPIRIN EC 81 MG PO TBEC
81.0000 mg | DELAYED_RELEASE_TABLET | Freq: Every day | ORAL | Status: DC
  Administered 2019-03-17: 81 mg via ORAL
  Filled 2019-03-16 (×2): qty 1

## 2019-03-16 MED ORDER — CEFAZOLIN SODIUM 1 G IJ SOLR
INTRAMUSCULAR | Status: AC
  Filled 2019-03-16: qty 20

## 2019-03-16 MED ORDER — ONDANSETRON HCL 4 MG/2ML IJ SOLN
INTRAMUSCULAR | Status: AC
  Filled 2019-03-16: qty 2

## 2019-03-16 MED ORDER — BACITRACIN ZINC 500 UNIT/GM EX OINT
1.0000 "application " | TOPICAL_OINTMENT | Freq: Three times a day (TID) | CUTANEOUS | Status: DC
  Administered 2019-03-16 – 2019-03-17 (×4): 1 via TOPICAL
  Filled 2019-03-16: qty 28.35

## 2019-03-16 MED ORDER — MIDAZOLAM HCL 2 MG/2ML IJ SOLN
INTRAMUSCULAR | Status: AC
  Filled 2019-03-16: qty 2

## 2019-03-16 MED ORDER — ARTIFICIAL TEARS OPHTHALMIC OINT
TOPICAL_OINTMENT | OPHTHALMIC | Status: AC
  Filled 2019-03-16: qty 3.5

## 2019-03-16 MED ORDER — PROPOFOL 1000 MG/100ML IV EMUL
INTRAVENOUS | Status: AC
  Filled 2019-03-16: qty 100

## 2019-03-16 MED ORDER — LACTATED RINGERS IV SOLN
INTRAVENOUS | Status: DC | PRN
  Administered 2019-03-16 (×2): via INTRAVENOUS

## 2019-03-16 MED ORDER — PHENYLEPHRINE 40 MCG/ML (10ML) SYRINGE FOR IV PUSH (FOR BLOOD PRESSURE SUPPORT)
PREFILLED_SYRINGE | INTRAVENOUS | Status: DC | PRN
  Administered 2019-03-16 (×2): 80 ug via INTRAVENOUS
  Administered 2019-03-16: 120 ug via INTRAVENOUS

## 2019-03-16 MED ORDER — FENTANYL CITRATE (PF) 100 MCG/2ML IJ SOLN
25.0000 ug | INTRAMUSCULAR | Status: DC | PRN
  Administered 2019-03-16 (×3): 50 ug via INTRAVENOUS

## 2019-03-16 MED ORDER — PROMETHAZINE HCL 25 MG PO TABS: 25.0000 mg | ORAL_TABLET | Freq: Four times a day (QID) | ORAL | Status: DC | PRN

## 2019-03-16 MED ORDER — DEXAMETHASONE SODIUM PHOSPHATE 10 MG/ML IJ SOLN
INTRAMUSCULAR | Status: AC
  Filled 2019-03-16: qty 1

## 2019-03-16 MED ORDER — CIPROFLOXACIN-DEXAMETHASONE 0.3-0.1 % OT SUSP
OTIC | Status: AC
  Filled 2019-03-16: qty 7.5

## 2019-03-16 MED ORDER — FENTANYL CITRATE (PF) 100 MCG/2ML IJ SOLN
INTRAMUSCULAR | Status: DC | PRN
  Administered 2019-03-16 (×5): 50 ug via INTRAVENOUS

## 2019-03-16 MED ORDER — SODIUM CHLORIDE 0.9 % IV SOLN
INTRAVENOUS | Status: DC | PRN
  Administered 2019-03-16: 20 ug/min via INTRAVENOUS

## 2019-03-16 MED ORDER — ONDANSETRON HCL 4 MG/2ML IJ SOLN
INTRAMUSCULAR | Status: DC | PRN
  Administered 2019-03-16: 4 mg via INTRAVENOUS

## 2019-03-16 MED ORDER — DEXAMETHASONE SODIUM PHOSPHATE 10 MG/ML IJ SOLN
INTRAMUSCULAR | Status: DC | PRN
  Administered 2019-03-16: 10 mg via INTRAVENOUS

## 2019-03-16 MED ORDER — PROMETHAZINE HCL 25 MG RE SUPP
25.0000 mg | Freq: Four times a day (QID) | RECTAL | Status: DC | PRN
  Filled 2019-03-16: qty 1

## 2019-03-16 MED ORDER — CEFAZOLIN SODIUM-DEXTROSE 2-3 GM-%(50ML) IV SOLR
INTRAVENOUS | Status: DC | PRN
  Administered 2019-03-16: 2 g via INTRAVENOUS

## 2019-03-16 MED ORDER — LIDOCAINE 2% (20 MG/ML) 5 ML SYRINGE
INTRAMUSCULAR | Status: AC
  Filled 2019-03-16: qty 5

## 2019-03-16 MED ORDER — LIDOCAINE 2% (20 MG/ML) 5 ML SYRINGE
INTRAMUSCULAR | Status: DC | PRN
  Administered 2019-03-16: 80 mg via INTRAVENOUS

## 2019-03-16 MED ORDER — SUCCINYLCHOLINE CHLORIDE 200 MG/10ML IV SOSY
PREFILLED_SYRINGE | INTRAVENOUS | Status: AC
  Filled 2019-03-16: qty 10

## 2019-03-16 MED ORDER — SUCCINYLCHOLINE CHLORIDE 200 MG/10ML IV SOSY
PREFILLED_SYRINGE | INTRAVENOUS | Status: DC | PRN
  Administered 2019-03-16: 150 mg via INTRAVENOUS

## 2019-03-16 MED ORDER — PROMETHAZINE HCL 25 MG/ML IJ SOLN: 6.2500 mg | INTRAMUSCULAR | Status: DC | PRN

## 2019-03-16 MED ORDER — CELECOXIB 200 MG PO CAPS
ORAL_CAPSULE | ORAL | Status: AC
  Administered 2019-03-16: 400 mg
  Filled 2019-03-16: qty 2

## 2019-03-16 MED ORDER — ACETAMINOPHEN 500 MG PO TABS
1000.0000 mg | ORAL_TABLET | Freq: Once | ORAL | Status: AC
  Administered 2019-03-16: 1000 mg via ORAL
  Filled 2019-03-16: qty 2

## 2019-03-16 MED ORDER — MIDAZOLAM HCL 2 MG/2ML IJ SOLN
INTRAMUSCULAR | Status: DC | PRN
  Administered 2019-03-16: 2 mg via INTRAVENOUS

## 2019-03-16 MED ORDER — EPHEDRINE SULFATE-NACL 50-0.9 MG/10ML-% IV SOSY
PREFILLED_SYRINGE | INTRAVENOUS | Status: DC | PRN
  Administered 2019-03-16 (×2): 10 mg via INTRAVENOUS

## 2019-03-16 MED ORDER — INSULIN ASPART 100 UNIT/ML ~~LOC~~ SOLN
0.0000 [IU] | Freq: Three times a day (TID) | SUBCUTANEOUS | Status: DC
  Administered 2019-03-16: 11 [IU] via SUBCUTANEOUS

## 2019-03-16 MED ORDER — FENTANYL CITRATE (PF) 250 MCG/5ML IJ SOLN
INTRAMUSCULAR | Status: AC
  Filled 2019-03-16: qty 5

## 2019-03-16 MED ORDER — GABAPENTIN 100 MG PO CAPS
100.0000 mg | ORAL_CAPSULE | Freq: Every day | ORAL | Status: DC
  Administered 2019-03-16 – 2019-03-17 (×2): 100 mg via ORAL
  Filled 2019-03-16 (×2): qty 1

## 2019-03-16 MED ORDER — 0.9 % SODIUM CHLORIDE (POUR BTL) OPTIME
TOPICAL | Status: DC | PRN
  Administered 2019-03-16: 09:00:00 1000 mL

## 2019-03-16 MED ORDER — PHENYLEPHRINE 40 MCG/ML (10ML) SYRINGE FOR IV PUSH (FOR BLOOD PRESSURE SUPPORT)
PREFILLED_SYRINGE | INTRAVENOUS | Status: AC
  Filled 2019-03-16: qty 10

## 2019-03-16 MED ORDER — LIDOCAINE-EPINEPHRINE 1 %-1:100000 IJ SOLN
INTRAMUSCULAR | Status: AC
  Filled 2019-03-16: qty 1

## 2019-03-16 SURGICAL SUPPLY — 58 items
ADH SKN CLS APL DERMABOND .7 (GAUZE/BANDAGES/DRESSINGS) ×1
ATTRACTOMAT 16X20 MAGNETIC DRP (DRAPES) ×1 IMPLANT
BALL CTTN LRG ABS STRL LF (GAUZE/BANDAGES/DRESSINGS) ×1
BLADE CLIPPER SURG (BLADE) IMPLANT
BLADE SURG 15 STRL LF DISP TIS (BLADE) ×1 IMPLANT
BLADE SURG 15 STRL SS (BLADE) ×2
CANISTER SUCT 3000ML PPV (MISCELLANEOUS) ×2 IMPLANT
CLEANER TIP ELECTROSURG 2X2 (MISCELLANEOUS) ×2 IMPLANT
CONT SPEC 4OZ CLIKSEAL STRL BL (MISCELLANEOUS) ×1 IMPLANT
CORD BIPOLAR FORCEPS 12FT (ELECTRODE) ×2 IMPLANT
COTTONBALL LRG STERILE PKG (GAUZE/BANDAGES/DRESSINGS) ×2 IMPLANT
COVER SURGICAL LIGHT HANDLE (MISCELLANEOUS) ×2 IMPLANT
COVER WAND RF STERILE (DRAPES) ×1 IMPLANT
DERMABOND ADVANCED (GAUZE/BANDAGES/DRESSINGS) ×1
DERMABOND ADVANCED .7 DNX12 (GAUZE/BANDAGES/DRESSINGS) ×1 IMPLANT
DRAIN HEMOVAC 7FR (DRAIN) IMPLANT
DRAIN SNY 10 ROU (WOUND CARE) ×1 IMPLANT
DRAIN WOUND SNY 15 RND (WOUND CARE) IMPLANT
DRAPE HALF SHEET 40X57 (DRAPES) IMPLANT
DRAPE INCISE 23X17 IOBAN STRL (DRAPES) ×1
DRAPE INCISE 23X17 STRL (DRAPES) ×1 IMPLANT
DRAPE INCISE IOBAN 23X17 STRL (DRAPES) ×1 IMPLANT
DRAPE MICROSCOPE LEICA 46X105 (MISCELLANEOUS) ×1 IMPLANT
ELECT COATED BLADE 2.86 ST (ELECTRODE) ×2 IMPLANT
ELECT REM PT RETURN 9FT ADLT (ELECTROSURGICAL) ×2
ELECTRODE REM PT RTRN 9FT ADLT (ELECTROSURGICAL) ×1 IMPLANT
EVACUATOR SILICONE 100CC (DRAIN) IMPLANT
FORCEPS BIPOLAR SPETZLER 8 1.0 (NEUROSURGERY SUPPLIES) ×2 IMPLANT
GAUZE 4X4 16PLY RFD (DISPOSABLE) ×3 IMPLANT
GLOVE ECLIPSE 7.5 STRL STRAW (GLOVE) ×2 IMPLANT
GOWN STRL NON-REIN LRG LVL3 (GOWN DISPOSABLE) ×4 IMPLANT
GOWN STRL REUS W/ TWL LRG LVL3 (GOWN DISPOSABLE) ×2 IMPLANT
GOWN STRL REUS W/TWL LRG LVL3 (GOWN DISPOSABLE) ×4
KIT BASIN OR (CUSTOM PROCEDURE TRAY) ×2 IMPLANT
KIT TURNOVER KIT B (KITS) ×2 IMPLANT
LOCATOR NERVE 3 VOLT (DISPOSABLE) IMPLANT
NDL PRECISIONGLIDE 27X1.5 (NEEDLE) ×1 IMPLANT
NEEDLE PRECISIONGLIDE 27X1.5 (NEEDLE) ×2 IMPLANT
NS IRRIG 1000ML POUR BTL (IV SOLUTION) ×2 IMPLANT
PAD ARMBOARD 7.5X6 YLW CONV (MISCELLANEOUS) ×4 IMPLANT
PENCIL FOOT CONTROL (ELECTRODE) ×2 IMPLANT
SHEARS HARMONIC 9CM CVD (BLADE) ×2 IMPLANT
STAPLER VISISTAT 35W (STAPLE) ×2 IMPLANT
SUT CHROMIC 3 0 SH 27 (SUTURE) ×1 IMPLANT
SUT CHROMIC 4 0 PS 2 18 (SUTURE) ×2 IMPLANT
SUT ETHILON 3 0 PS 1 (SUTURE) ×1 IMPLANT
SUT ETHILON 5 0 P 3 18 (SUTURE)
SUT NYLON ETHILON 5-0 P-3 1X18 (SUTURE) IMPLANT
SUT SILK 2 0 SH CR/8 (SUTURE) ×2 IMPLANT
SUT SILK 4 0 REEL (SUTURE) ×2 IMPLANT
SUT VIC AB 3-0 SH 27 (SUTURE) ×4
SUT VIC AB 3-0 SH 27X BRD (SUTURE) IMPLANT
SYR BULB 3OZ (MISCELLANEOUS) ×2 IMPLANT
SYR CONTROL 10ML LL (SYRINGE) ×2 IMPLANT
TOWEL GREEN STERILE FF (TOWEL DISPOSABLE) ×2 IMPLANT
TRAY ENT MC OR (CUSTOM PROCEDURE TRAY) ×2 IMPLANT
TRAY FOLEY MTR SLVR 14FR STAT (SET/KITS/TRAYS/PACK) ×1 IMPLANT
TUBE CONNECTING 12X1/4 (SUCTIONS) ×2 IMPLANT

## 2019-03-16 NOTE — TOC Initial Note (Signed)
Transition of Care [facility]) - Initial/Assessment Note    Patient Details  Name: [name] MRN: [id number] Date of Birth: March 21, 1956  Transition of Care [facility]) CM/SW Contact:    Marilu Favre, RN Phone Number: 03/16/2019, 1:58 PM  Clinical Narrative:                 Patient from home with wife. Does not have a PCP.   Patient states his wife is arranging an appointment for him with her PCP.   Expected Discharge Plan: Home/Self Care Barriers to Discharge: Continued Medical Work up   Patient Goals and CMS Choice Patient states their goals for this hospitalization and ongoing recovery are:: to go home CMS Medicare.gov Compare Post Acute Care list provided to:: Patient Choice offered to / list presented to : NA  Expected Discharge Plan and Services Expected Discharge Plan: Home/Self Care   Discharge Planning Services: CM Consult   Living arrangements for the past 2 months: Single Family Home                 DME Arranged: N/A         HH Arranged: NA          Prior Living Arrangements/Services Living arrangements for the past 2 months: Single Family Home Lives with:: Spouse Patient language and need for interpreter reviewed:: Yes Do you feel safe going back to the place where you live?: Yes      Need for Family Participation in Patient Care: Yes (Comment) Care giver support system in place?: Yes (comment)   Criminal Activity/Legal Involvement Pertinent to Current Situation/Hospitalization: No - Comment as needed  Activities of Daily Living      Permission Sought/Granted   Permission granted to share information with : No              Emotional Assessment Appearance:: Appears older than stated age Attitude/Demeanor/Rapport: Engaged Affect (typically observed): Accepting Orientation: : Oriented to Self, Oriented to Place, Oriented to  Time, Oriented to Situation Alcohol / Substance Use: Not Applicable Psych Involvement: No (comment)  Admission  diagnosis:  Cancer of Parotid Gland Patient Active Problem List   Diagnosis Date Noted  . Cancer of parotid gland ([place]) 03/16/2019  . Secondary malignant neoplasm of parotid lymph nodes ([place]) 02/16/2019  . Cancer of upper lobe of left lung ([place]) 02/16/2019  . Neck mass 02/03/2019  . Mass of left lung 01/26/2019  . Lung cancer ([place]) 01/24/2019  . Malignancy ([place]) 01/23/2019  . Status post transmetatarsal amputation of right foot ([place]) 11/12/2018  . Dehiscence of amputation stump ([place])   . Status post transmetatarsal amputation of foot, right ([place]) 09/22/2018  . Status post surgery 06/25/2018  . Type 2 diabetes mellitus ([place]) 06/19/2018  . Sepsis ([place]) 06/19/2018  . Back pain 06/19/2018  . Former smoker 06/19/2018  . Hyperglycemia   . Dehydration 07/07/2012  . Syncope 07/07/2012  . Hypotension 07/07/2012  . Leukocytosis 07/07/2012  . Hyperkalemia 07/07/2012   PCP:  Patient, No Pcp Per Pharmacy:   [place], [place] [address] Phone: [phone] Fax: [phone]     Social Determinants of Health (SDOH) Interventions    Readmission Risk Interventions No flowsheet data found.

## 2019-03-16 NOTE — Anesthesia Procedure Notes (Signed)
Procedure Name: Intubation Date/Time: 03/16/2019 8:32 AM Performed by: Genelle Bal, CRNA Pre-anesthesia Checklist: Patient identified, Emergency Drugs available, Suction available and Patient being monitored Patient Re-evaluated:Patient Re-evaluated prior to induction Oxygen Delivery Method: Circle system utilized Preoxygenation: Pre-oxygenation with 100% oxygen Induction Type: IV induction Laryngoscope Size: Miller and 2 Grade View: Grade I Tube type: Oral Tube size: 7.5 mm Number of attempts: 1 Airway Equipment and Method: Stylet and Oral airway Placement Confirmation: ETT inserted through vocal cords under direct vision,  positive ETCO2 and breath sounds checked- equal and bilateral Secured at: 23 cm Tube secured with: Tape Dental Injury: Teeth and Oropharynx as per pre-operative assessment

## 2019-03-16 NOTE — Care Management Obs Status (Signed)
[facility] NOTIFICATION   Patient Details  Name: [name] MRN: [id number] Date of Birth: [birth year]   Medicare Observation Status Notification Given:  Yes    Marilu Favre, RN 03/16/2019, 1:56 PM

## 2019-03-16 NOTE — Op Note (Addendum)
OPERATIVE REPORT  DATE OF SURGERY: 03/16/2019  PATIENT:  [name],  63 y.o. male  PRE-OPERATIVE DIAGNOSIS:  Cancer of Parotid Gland  POST-OPERATIVE DIAGNOSIS:  Cancer of Parotid Gland  PROCEDURE:  Procedure(s): Fischer PAROTIDECTOMY resection of facial skin and facial nerve disection, Fischer side  SURGEON:  Beckie Salts, MD  ASSISTANTS: Melissa Montane, MD  ANESTHESIA:   General   EBL: 100 ml  DRAINS: 10 French round JP  LOCAL MEDICATIONS USED:  None  SPECIMEN: Biopsy of parotid mass frozen section consistent with malignancy.  Fischer parotidectomy with overlying skin for routine pathologic evaluation.  COUNTS:  Correct  PROCEDURE DETAILS: The patient was taken to the operating room and placed on the operating table in the supine position. Following induction of general endotracheal anesthesia, the Fischer face neck and ear were prepped and draped in a standard fashion.  The mass was identified and a preauricular skin incision was outlined with a marking pen with continuation around the mastoid tip and down into the cervical skin crease about 3 fingerbreadths below the angle of the mandible.  An additional elliptical incision was created anterior to the mass to remove a large skin paddle.  The total size of the skin resection was approximately 5 x 12 cm.  Electrocautery was used to incise skin and subcutaneous tissue.  Electrocautery and bipolar cautery used for hemostasis throughout the case.  An anterior parotid flap was elevated.  Posteriorly the parotid was dissected off of the ear canal cartilage and ultimately bone.  Inferiorly the skin was elevated and a subplatysmal layer.  The tumor and the parotid were brought forward off the ear canal exposing the tympanomastoid suture line.  The posterior belly digastric attachment was identified as well.  Facial nerve main trunk was identified at this level.  The facial nerve was dissected out using McCabe dissector peripherally starting  with a lower division.  All branches were identified and preserved.  The harmonic dissector was used to dissected through the parotid tissue elevating the specimen up superiorly.  The upper division of the facial nerve was dissected and about 1 cm beyond the has was found to be encased by tumor.  I was unable to develop a surgical plane between the tumor and the nerve and the tumor extended for deeply beyond the plane of the nerve as well.  At this point decision was made to sacrifice the superior division.  This was ligated between clamps and divided.  The remainder of the upper part of the dissection was accomplished down to the masseter muscle.  The medial limit of dissection was the glenoid and the condyle of the mandible which were kept intact.  Superiorly the zygomatic arch was the limit of dissection.  The entire specimen was delivered and sent for pathology evaluation.  The wound was irrigated with saline and hemostasis was completed.  Cervical skin was undermined several centimeters in all directions to facilitate a rotation flap for closure.  The cervical facial rotation flap was brought up superiorly.  Initial tacking sutures were created using 3-0 Vicryl to assure that there was adequate tension-free skin for closure.  Closure was then completed using interrupted 3-0 Vicryl in the subcutaneous and platysma layer.  The drain was exited through a separate stab incision and secured in place with nylon suture.  The drain was charged.  Skin staples were used on the skin.  Patient was awakened extubated and transferred to recovery in stable condition.  Upon extubation the patient had  very hard and significant coughing.  This resulted in some bleeding from the drain site which was treated with pressure.  There is no hematoma formation.  Surgical assistant was necessary in the case for identification of facial nerve movement during the dissection, retraction of the specimen and for exposure and suctioning,  planning and development of the cervical facial rotation flap.    PATIENT DISPOSITION:  To PACU, stable

## 2019-03-16 NOTE — Progress Notes (Signed)
   ENT Progress Note:  s/p Procedure(s): right PAROTIDECTOMY resection of facial skin and facial nerve disection, right side   Subjective: Complaining of mild discomfort.  Objective: Vital signs in last 24 hours: Temp:  [97 F (36.1 C)-98.1 F (36.7 C)] 97.4 F (36.3 C) (08/12 1345) Pulse Rate:  [66-104] 89 (08/12 1345) Resp:  [10-20] 16 (08/12 1345) BP: (110-141)/(64-89) 126/89 (08/12 1345) SpO2:  [91 %-99 %] 97 % (08/12 1345) Weight change:     Intake/Output from previous day: No intake/output data recorded. Intake/Output this shift: Total I/O In: 1420 [P.O.:120; I.V.:1300] Out: 419 [Urine:300; Drains:19; Blood:100]  Labs: Recent Labs    03/14/19 1548  WBC 11.7*  HGB 14.3  HCT 42.8  PLT 305   Recent Labs    03/14/19 1548  NA 138  K 4.1  CL 103  CO2 25  GLUCOSE 156*  BUN 7*  CALCIUM 9.2    Studies/Results: No results found.   PHYSICAL EXAM: Incision intact with mild oozing, no swelling or evidence of hematoma.   Jackson-Pratt drain in place and functional. Mid facial nerve weakness.   Assessment/Plan: Patient stable after surgery, continue current postoperative care.     Jerrell Belfast 03/16/2019, 5:11 PM

## 2019-03-16 NOTE — Transfer of Care (Signed)
Immediate Anesthesia Transfer of Care Note  Patient: [name]  Procedure(s) Performed: right PAROTIDECTOMY (Right Face) resection of facial skin and facial nerve disection, right side (Right Face)  Patient Location: PACU  Anesthesia Type:General  Level of Consciousness: awake, alert  and oriented  Airway & Oxygen Therapy: Patient Spontanous Breathing and Patient connected to face mask oxygen  Post-op Assessment: Report given to RN and Post -op Vital signs reviewed and stable  Post vital signs: Reviewed and stable  Last Vitals:  Vitals Value Taken Time  BP 126/73 03/16/19 1055  Temp    Pulse 99 03/16/19 1056  Resp 26 03/16/19 1056  SpO2 95 % 03/16/19 1056  Vitals shown include unvalidated device data.  Last Pain: There were no vitals filed for this visit.       Complications: No apparent anesthesia complications

## 2019-03-16 NOTE — Anesthesia Postprocedure Evaluation (Signed)
Anesthesia Post Note  Patient: [name]  Procedure(s) Performed: right PAROTIDECTOMY (Right Face) resection of facial skin and facial nerve disection, right side (Right Face)     Patient location during evaluation: PACU Anesthesia Type: General Level of consciousness: sedated Pain management: pain level controlled Vital Signs Assessment: post-procedure vital signs reviewed and stable Respiratory status: spontaneous breathing and respiratory function stable Cardiovascular status: stable Postop Assessment: no apparent nausea or vomiting Anesthetic complications: no    Last Vitals:  Vitals:   03/16/19 1150 03/16/19 1220  BP: 125/82 123/85  Pulse: 97 90  Resp: 17 12  Temp: (!) 36.1 C   SpO2: 98% 99%    Last Pain:  Vitals:   03/16/19 1220  PainSc: Asleep                 [name] DANIEL

## 2019-03-16 NOTE — Interval H&P Note (Signed)
History and Physical Interval Note:  03/16/2019 8:15 AM  [name]  has presented today for surgery, with the diagnosis of Cancer of Parotid Gland.  The various methods of treatment have been discussed with the patient and family. After consideration of risks, benefits and other options for treatment, the patient has consented to  Procedure(s) with comments: PAROTIDECTOMY / possible total (Right) - RNFA Requested resection of skin / possible facial nerve sacrifice (N/A) as a surgical intervention.  The patient's history has been reviewed, patient examined, no change in status, stable for surgery.  I have reviewed the patient's chart and labs.  Questions were answered to the patient's satisfaction.     [name]

## 2019-03-17 ENCOUNTER — Ambulatory Visit: Payer: Medicare Other | Admitting: Orthopedic Surgery

## 2019-03-17 ENCOUNTER — Encounter (HOSPITAL_COMMUNITY): Payer: Self-pay | Admitting: Otolaryngology

## 2019-03-17 DIAGNOSIS — C7989 Secondary malignant neoplasm of other specified sites: Secondary | ICD-10-CM | POA: Diagnosis not present

## 2019-03-17 LAB — GLUCOSE, CAPILLARY: Glucose-Capillary: 151 mg/dL — ABNORMAL HIGH (ref 70–99)

## 2019-03-17 MED ORDER — HYDROCODONE-ACETAMINOPHEN 7.5-325 MG PO TABS: 1.0000 | ORAL_TABLET | Freq: Four times a day (QID) | ORAL | 0 refills | Status: DC | PRN

## 2019-03-17 NOTE — Discharge Summary (Signed)
Physician Discharge Summary  Patient ID: [name] MRN: [id number] DOB/AGE: [birth year] 63 y.o.  Admit date: 03/16/2019 Discharge date: 03/17/2019  Admission Diagnoses: Metastatic cancer right parotid  Discharge Diagnoses:  Active Problems:   Cancer of parotid gland [facility])   Discharged Condition: good  Hospital Course: No complications  Consults: none  Significant Diagnostic Studies: none  Treatments: surgery: Right parotidectomy, skin resection, rotation flap closure.  Discharge Exam: Blood pressure (!) 130/92, pulse 74, temperature 98 F (36.7 C), temperature source Oral, resp. rate 18, SpO2 97 %. PHYSICAL EXAM: Weakness right facial nerve.  Incisions are clean dry and intact.  Skin all looks viable.  No swelling.  Drain removed.  Disposition: Discharge disposition: 01-Home or Self Care       Discharge Instructions    Diet - low sodium heart healthy   Complete by: As directed    Increase activity slowly   Complete by: As directed      Allergies as of 03/17/2019      Reactions   Adhesive [tape] Other (See Comments)   CAUSES BLISTERS- PLEASE USE PAPER TAPE!!   Bactrim Ds [sulfamethoxazole-trimethoprim] Nausea And Vomiting   Trental [pentoxifylline] Nausea And Vomiting      Medication List    TAKE these medications   aspirin 81 MG EC tablet Take 1 tablet (81 mg total) by mouth daily.   clopidogrel 75 MG tablet Commonly known as: PLAVIX Take 1 tablet (75 mg total) by mouth daily with breakfast.   gabapentin 100 MG capsule Commonly known as: NEURONTIN Take 1-2 capsules (100-200 mg total) by mouth See admin instructions. Take 100 mg by mouth in the morning and 200 mg at bedtime   glipiZIDE 5 MG tablet Commonly known as: GLUCOTROL Take 2 tablets (10 mg total) by mouth daily before breakfast.   HYDROcodone-acetaminophen 5-325 MG tablet Commonly known as: Norco Take 1 tablet by mouth every 6 (six) hours as needed for up to 12 doses for moderate  pain. What changed: Another medication with the same name was added. Make sure you understand how and when to take each.   HYDROcodone-acetaminophen 7.5-325 MG tablet Commonly known as: Norco Take 1 tablet by mouth every 6 (six) hours as needed for moderate pain. What changed: You were already taking a medication with the same name, and this prescription was added. Make sure you understand how and when to take each.   rosuvastatin 10 MG tablet Commonly known as: CRESTOR Take 1 tablet (10 mg total) by mouth daily at 6 PM.      Follow-up Information    Izora Gala, MD. Call on 03/25/2019.   Specialty: Otolaryngology Contact information: [address] [place] [address] [phone]           Signed: Izora Gala 03/17/2019, 9:19 AM

## 2019-03-17 NOTE — Discharge Instructions (Signed)
Apply antibiotic ointment to all of the staples along the incision twice daily.  Tape the right eye closed at night if it does not close on its own.  Avoid heavy lifting for 2 weeks.

## 2019-03-17 NOTE — Progress Notes (Signed)
Patient bleeding at site of JP drain removal, pressure applied to area, patient awake and alert, vital 124/88, HR 84, RR 18, 99% on room air. MD paged.   1030- patient stopped bleeding and gauze dressing applied to drain removal site. Will continue to monitor closely.

## 2019-03-17 NOTE — Plan of Care (Signed)
Pain medication administered per orders, fall precautions in place, call bell within reach

## 2019-03-17 NOTE — Progress Notes (Signed)
Patient discharged to home. Verbalized understanding of all discharge instructions including incision care, activity restrictions,  discharge medications and follow up MD visits.

## 2019-03-24 ENCOUNTER — Telehealth: Payer: Self-pay | Admitting: *Deleted

## 2019-03-30 ENCOUNTER — Telehealth: Payer: Self-pay | Admitting: *Deleted

## 2019-03-30 NOTE — Telephone Encounter (Addendum)
Oncology Nurse Navigator Documentation  Called Mr. Kohlmeyer to coordinate CT [facility] appt and to check on status of rescheduling appt with Dr. Roxan Hockey. He indicated he is seeing ENT Constance Holster on Monday next week for removal of staples s/p 8/12 parotidectomy. He will reschedule with Dr. Roxan Hockey after seeing Dr. Constance Holster. He had me speak with his wife to schedule CT SIM as she will provide transportation.  I provided her several options for CT SIM appt, she stated preference for 9/4 1:00.  I indicated I would call her back to confirm.  [name], RN, BSN Head & Neck Oncology Nurse [place] at [place] [phone]

## 2019-03-31 ENCOUNTER — Telehealth: Payer: Self-pay | Admitting: *Deleted

## 2019-03-31 NOTE — Telephone Encounter (Signed)
Oncology Nurse Navigator Documentation  Spoke with [name], informed her CT [facility] scheduled for 1:30 9/4, encouraged 1:00 arrival for registration and arrival to Radiation Waiting.  She voiced understanding.  [name], RN, BSN Head & Neck Oncology Nurse [place] at [place] [phone]

## 2019-04-01 ENCOUNTER — Telehealth: Payer: Self-pay | Admitting: Radiology

## 2019-04-01 NOTE — Telephone Encounter (Signed)
Patient has new spot that has come up on right foot and it is aching. Per wife, Joycelyn Schmid, he normally wears slip on shoes but two weeks ago wore tennis shoes while working in the yard and started to have some pain. Patient is status post transmet of the right foot in April.  Work in appt made for Monday, 04/04/2019.

## 2019-04-04 ENCOUNTER — Encounter: Payer: Self-pay | Admitting: Orthopedic Surgery

## 2019-04-04 ENCOUNTER — Ambulatory Visit (INDEPENDENT_AMBULATORY_CARE_PROVIDER_SITE_OTHER): Payer: Medicare Other | Admitting: Orthopedic Surgery

## 2019-04-04 VITALS — Ht 68.5 in | Wt 161.0 lb

## 2019-04-04 DIAGNOSIS — T8781 Dehiscence of amputation stump: Secondary | ICD-10-CM | POA: Diagnosis not present

## 2019-04-04 DIAGNOSIS — Z89431 Acquired absence of right foot: Secondary | ICD-10-CM | POA: Diagnosis not present

## 2019-04-05 ENCOUNTER — Encounter: Payer: Medicare Other | Admitting: Thoracic Surgery (Cardiothoracic Vascular Surgery)

## 2019-04-05 ENCOUNTER — Ambulatory Visit: Payer: Medicare Other | Admitting: Orthopedic Surgery

## 2019-04-05 ENCOUNTER — Encounter: Payer: Self-pay | Admitting: Orthopedic Surgery
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
## 2019-04-06 ENCOUNTER — Telehealth: Payer: Self-pay | Admitting: *Deleted

## 2019-04-06 ENCOUNTER — Other Ambulatory Visit: Payer: Self-pay | Admitting: Radiation Oncology

## 2019-04-06 NOTE — Telephone Encounter (Signed)
Oncology Nurse Navigator Documentation  Oncology Nurse Navigator Flowsheets 04/06/2019  Navigator Location [facility]  Referral Date to RadOnc/MedOnc -  Navigator Encounter Type Telephone/I received a message from Dr. Isidore Moos that patient has missed several appt with thoracic surgery and ct bx.  I was asked to call patient to see if they want a bx or not. I called the wife several time but could not reach her nor leave a vm message.  I called the patient and he was yelling that he did not want a bx.  I relayed that to Dr. Isidore Moos, Dr. Julien Nordmann, and Dr. Roxan Hockey.   Telephone Outgoing Call  Patient Visit Type -  Treatment Phase Abnormal Scans  Barriers/Navigation Needs Coordination of Care;Education  Education Other  Interventions Coordination of Care;Education  Coordination of Care Other  Education Method Verbal  Acuity Level 3  Time Spent with Patient 72

## 2019-04-08 ENCOUNTER — Ambulatory Visit
Admission: RE | Admit: 2019-04-08 | Discharge: 2019-04-08 | Disposition: A | Discharge status: Home / Self Care | Payer: Medicare Other | Source: Ambulatory Visit | Attending: Radiation Oncology | Admitting: Radiation Oncology

## 2019-04-08 ENCOUNTER — Other Ambulatory Visit: Payer: Self-pay

## 2019-04-08 DIAGNOSIS — C3412 Malignant neoplasm of upper lobe, left bronchus or lung: Secondary | ICD-10-CM | POA: Insufficient documentation

## 2019-04-08 DIAGNOSIS — Z510 Encounter for antineoplastic radiation therapy: Secondary | ICD-10-CM | POA: Diagnosis not present

## 2019-04-08 DIAGNOSIS — C07 Malignant neoplasm of parotid gland: Secondary | ICD-10-CM | POA: Diagnosis present

## 2019-04-08 MED ORDER — LORAZEPAM 0.5 MG PO TABS: ORAL_TABLET | ORAL | 0 refills | Status: DC

## 2019-04-08 NOTE — Progress Notes (Signed)
Head and Neck and LUNG Cancer Simulation, IMRT treatment planning note   Outpatient  Diagnosis:    ICD-10-CM   1. Cancer of parotid gland (HCC)  C07 LORazepam (ATIVAN) 0.5 MG tablet  2. Cancer of upper lobe of left lung (HCC)  C34.12 LORazepam (ATIVAN) 0.5 MG tablet    The patient was taken to the CT simulator and identity was confirmed.  All relevant records and images related to the planned course of therapy were reviewed.  The patient freely provided informed written consent to proceed with treatment after reviewing the details related to the planned course of therapy. The consent form was witnessed and verified by the simulation staff.   I spoke w/ his wife on speaker phone and determined his neck scars are intact.  He has no-showed twice with CTS but this has been rescheduled again for next week.  It is unclear if he will ultimately undergo biopsy of his chest.  Tentatively we will plan to treat the hypermetabolic areas in his chest in addition to the right parotid bed and neck.  They will be out of town until right before September 28.  We plan to start on that day.  The patient was laid in the supine position on the table. An Aquaplast head and shoulder mask was custom fitted to the patient's anatomy. High-resolution CT axial imaging was obtained of the head and neck without contrast. I verified that the quality of the imaging is good for treatment planning. 1 Medically Necessary Treatment Device was fabricated and supervised by me: Aquaplast mask.  Treatment planning note I plan to treat the patient with IMRT. I plan to treat the patient's neck and chest tumor sites. I plan to treat to a total dose of 66 Gray in 33  fractions. Dose calculation was ordered from dosimetry.  IMRT planning Note  IMRT is medically necessary and an important modality to deliver adequate dose to the patient's at risk tissues while sparing the patient's normal structures, including the: esophagus, parotid tissue,  mandible, brain stem, spinal cord, oral cavity, brachial plexus, heart, lungs.  This justifies the use of IMRT in the patient's treatment.   4DCT could not be performed today due to claustrophobia and refusal by patient to extend time of simulation. Ativan Rx'd for upcoming treatments.   -----------------------------------  Eppie Gibson, MD

## 2019-04-12 ENCOUNTER — Other Ambulatory Visit: Payer: Self-pay | Admitting: *Deleted

## 2019-04-12 ENCOUNTER — Ambulatory Visit: Payer: Medicare Other | Admitting: Orthopedic Surgery

## 2019-04-12 ENCOUNTER — Institutional Professional Consult (permissible substitution) (INDEPENDENT_AMBULATORY_CARE_PROVIDER_SITE_OTHER): Payer: Medicare Other | Admitting: Thoracic Surgery (Cardiothoracic Vascular Surgery)

## 2019-04-12 ENCOUNTER — Encounter: Payer: Self-pay | Admitting: *Deleted

## 2019-04-12 ENCOUNTER — Encounter: Payer: Self-pay | Admitting: Thoracic Surgery (Cardiothoracic Vascular Surgery)

## 2019-04-12 ENCOUNTER — Other Ambulatory Visit: Payer: Self-pay

## 2019-04-12 ENCOUNTER — Encounter: Payer: Medicare Other | Admitting: Thoracic Surgery (Cardiothoracic Vascular Surgery)

## 2019-04-12 VITALS — BP 124/75 | HR 93 | Temp 97.7°F | Resp 16 | Ht 68.5 in | Wt 162.0 lb

## 2019-04-12 DIAGNOSIS — C07 Malignant neoplasm of parotid gland: Secondary | ICD-10-CM

## 2019-04-12 DIAGNOSIS — R591 Generalized enlarged lymph nodes: Secondary | ICD-10-CM

## 2019-04-12 DIAGNOSIS — R599 Enlarged lymph nodes, unspecified: Secondary | ICD-10-CM

## 2019-04-12 DIAGNOSIS — R918 Other nonspecific abnormal finding of lung field: Secondary | ICD-10-CM | POA: Diagnosis not present

## 2019-04-12 DIAGNOSIS — R911 Solitary pulmonary nodule: Secondary | ICD-10-CM
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
## 2019-04-18 NOTE — Pre-Procedure Instructions (Signed)
[place], [place] [address] Phone: [phone] Fax: [phone]      Your procedure is scheduled on Thursday September 17th.  Report to [facility] Main Entrance "A" at 9:30 A.M., and check in at the Admitting office.  Call this number if you have problems the morning of surgery:  [phone]  Call [phone] if you have any questions prior to your surgery date Monday-Friday 8am-4pm    Remember:  Do not eat or drink after midnight the night before your surgery    Take these medicines the morning of surgery with A SIP OF WATER  gabapentin (NEURONTIN)   HOW TO [place]  Why is it important to control my blood sugar before and after surgery? . Improving blood sugar levels before and after surgery helps healing and can limit problems. . A way of improving blood sugar control is eating a healthy diet by: o  Eating less sugar and carbohydrates o  Increasing activity/exercise o  Talking with your doctor about reaching your blood sugar goals . High blood sugars (greater than 180 mg/dL) can raise your risk of infections and slow your recovery, so you will need to focus on controlling your diabetes during the weeks before surgery. . Make sure that the doctor who takes care of your diabetes knows about your planned surgery including the date and location.  How do I manage my blood sugar before surgery? . Check your blood sugar at least 4 times a day, starting 2 days before surgery, to make sure that the level is not too high or low. o Check your blood sugar the morning of your surgery when you wake up and every 2 hours until you get to the Short Stay unit. . If your blood sugar is less than 70 mg/dL, you will need to treat for low blood sugar: o Do not take insulin. o Treat a low blood sugar (less than 70 mg/dL) with  cup of clear juice (cranberry or apple), 4  glucose tablets, OR glucose gel. Recheck blood sugar in 15 minutes after treatment (to make sure it is greater than 70 mg/dL). If your blood sugar is not greater than 70 mg/dL on recheck, call [phone] o  for further instructions. . Report your blood sugar to the short stay nurse when you get to Short Stay.  . If you are admitted to the hospital after surgery: o Your blood sugar will be checked by the staff and you will probably be given insulin after surgery (instead of oral diabetes medicines) to make sure you have good blood sugar levels. o The goal for blood sugar control after surgery is 80-180 mg/dL.     WHAT DO I DO ABOUT MY DIABETES MEDICATION?   [name] Kitchen Do not take oral diabetes medicines (pills):glipiZIDE (GLUCOTROL)  the morning of surgery.   Follow your surgeon's instructions on when to stop Asprin/ clopidogrel (PLAVIX).  If no instructions were given by your surgeon then you will need to call the office to get those instructions.    As of today, STOP taking any Aspirin (unless otherwise instructed by your surgeon), Aleve, Naproxen, Ibuprofen, Motrin, Advil, Goody's, BC's, all herbal medications, fish oil, and all vitamins.    The Morning of Surgery  Do not wear jewelry, make-up or nail polish.  Do not wear lotions, powders, or perfumes/colognes, or deodorant  Do not shave 48 hours prior to surgery.  Men may shave face and neck.  Do not bring valuables to the hospital.  [facility] is not responsible for any belongings or valuables.  If you are a smoker, DO NOT Smoke 24 hours prior to surgery IF you wear a CPAP at night please bring your mask, tubing, and machine the morning of surgery   Remember that you must have someone to transport you home after your surgery, and remain with you for 24 hours if you are discharged the same day.   Contacts, glasses, hearing aids, dentures or bridgework may not be worn into surgery.    Leave your suitcase in the car.  After surgery  it may be brought to your room.  For patients admitted to the hospital, discharge time will be determined by your treatment team.  Patients discharged the day of surgery will not be allowed to drive home.    Special instructions:   [facility]- Preparing For Surgery  Before surgery, you can play an important role. Because skin is not sterile, your skin needs to be as free of germs as possible. You can reduce the number of germs on your skin by washing with CHG (chlorahexidine gluconate) Soap before surgery.  CHG is an antiseptic cleaner which kills germs and bonds with the skin to continue killing germs even after washing.    Oral Hygiene is also important to reduce your risk of infection.  Remember - BRUSH YOUR TEETH THE MORNING OF SURGERY WITH YOUR REGULAR TOOTHPASTE  Please do not use if you have an allergy to CHG or antibacterial soaps. If your skin becomes reddened/irritated stop using the CHG.  Do not shave (including legs and underarms) for at least 48 hours prior to first CHG shower. It is OK to shave your face.  Please follow these instructions carefully.   1. Shower the NIGHT BEFORE SURGERY and the MORNING OF SURGERY with CHG Soap.   2. If you chose to wash your hair, wash your hair first as usual with your normal shampoo.  3. After you shampoo, rinse your hair and body thoroughly to remove the shampoo.  4. Use CHG as you would any other liquid soap. You can apply CHG directly to the skin and wash gently with a scrungie or a clean washcloth.   5. Apply the CHG Soap to your body ONLY FROM THE NECK DOWN.  Do not use on open wounds or open sores. Avoid contact with your eyes, ears, mouth and genitals (private parts). Wash Face and genitals (private parts)  with your normal soap.   6. Wash thoroughly, paying special attention to the area where your surgery will be performed.  7. Thoroughly rinse your body with warm water from the neck down.  8. DO NOT shower/wash with your  normal soap after using and rinsing off the CHG Soap.  9. Pat yourself dry with a CLEAN TOWEL.  10. Wear CLEAN PAJAMAS to bed the night before surgery, wear comfortable clothes the morning of surgery  11. Place CLEAN SHEETS on your bed the night of your first shower and DO NOT SLEEP WITH PETS.    Day of Surgery:  Do not apply any deodorants/lotions. Please shower the morning of surgery with the CHG soap  Please wear clean clothes to the hospital/surgery center.   Remember to brush your teeth WITH YOUR REGULAR TOOTHPASTE.   Please read over the following fact sheets that you were given.

## 2019-04-19 ENCOUNTER — Other Ambulatory Visit: Payer: Self-pay

## 2019-04-19 ENCOUNTER — Other Ambulatory Visit (HOSPITAL_COMMUNITY)
Admission: RE | Admit: 2019-04-19 | Discharge: 2019-04-19 | Disposition: A | Discharge status: Home / Self Care | Payer: Medicare Other | Source: Ambulatory Visit | Attending: Thoracic Surgery (Cardiothoracic Vascular Surgery) | Admitting: Thoracic Surgery (Cardiothoracic Vascular Surgery)

## 2019-04-19 ENCOUNTER — Ambulatory Visit (HOSPITAL_COMMUNITY)
Admission: RE | Admit: 2019-04-19 | Discharge: 2019-04-19 | Disposition: A | Discharge status: Home / Self Care | Payer: Medicare Other | Source: Ambulatory Visit | Attending: Thoracic Surgery (Cardiothoracic Vascular Surgery) | Admitting: Thoracic Surgery (Cardiothoracic Vascular Surgery)

## 2019-04-19 ENCOUNTER — Encounter (HOSPITAL_COMMUNITY)
Admission: RE | Admit: 2019-04-19 | Discharge: 2019-04-19 | Disposition: A | Discharge status: Home / Self Care | Payer: Medicare Other | Source: Ambulatory Visit | Attending: Thoracic Surgery (Cardiothoracic Vascular Surgery) | Admitting: Thoracic Surgery (Cardiothoracic Vascular Surgery)

## 2019-04-19 ENCOUNTER — Encounter (HOSPITAL_COMMUNITY): Payer: Self-pay

## 2019-04-19 DIAGNOSIS — R911 Solitary pulmonary nodule: Secondary | ICD-10-CM | POA: Insufficient documentation

## 2019-04-19 DIAGNOSIS — Z01812 Encounter for preprocedural laboratory examination: Secondary | ICD-10-CM | POA: Insufficient documentation

## 2019-04-19 DIAGNOSIS — Z01818 Encounter for other preprocedural examination: Secondary | ICD-10-CM | POA: Insufficient documentation

## 2019-04-19 DIAGNOSIS — Z20828 Contact with and (suspected) exposure to other viral communicable diseases: Secondary | ICD-10-CM | POA: Diagnosis not present

## 2019-04-19 LAB — COMPREHENSIVE METABOLIC PANEL
ALT: 10 U/L (ref 0–44)
AST: 12 U/L — ABNORMAL LOW (ref 15–41)
Albumin: 3 g/dL — ABNORMAL LOW (ref 3.5–5.0)
Alkaline Phosphatase: 78 U/L (ref 38–126)
Anion gap: 11 (ref 5–15)
BUN: 7 mg/dL — ABNORMAL LOW (ref 8–23)
CO2: 27 mmol/L (ref 22–32)
Calcium: 9 mg/dL (ref 8.9–10.3)
Chloride: 98 mmol/L (ref 98–111)
Creatinine, Ser: 0.66 mg/dL (ref 0.61–1.24)
GFR calc Af Amer: >60 mL/min (ref >60)
GFR calc non Af Amer: >60 mL/min (ref >60)
Glucose, Bld: 156 mg/dL — ABNORMAL HIGH (ref 70–99)
Potassium: 3.4 mmol/L — ABNORMAL LOW (ref 3.5–5.1)
Sodium: 136 mmol/L (ref 135–145)
Total Bilirubin: 0.6 mg/dL (ref 0.3–1.2)
Total Protein: 6.9 g/dL (ref 6.5–8.1)

## 2019-04-19 LAB — CBC
HCT: 39 % (ref 39.0–52.0)
Hemoglobin: 12.9 g/dL — ABNORMAL LOW (ref 13.0–17.0)
MCH: 30.4 pg (ref 26.0–34.0)
MCHC: 33.1 g/dL (ref 30.0–36.0)
MCV: 92 fL (ref 80.0–100.0)
Platelets: 367 10*3/uL (ref 150–400)
RBC: 4.24 MIL/uL (ref 4.22–5.81)
RDW: 13.5 % (ref 11.5–15.5)
WBC: 10.2 10*3/uL (ref 4.0–10.5)
nRBC: 0 % (ref 0.0–0.2)

## 2019-04-19 LAB — PROTIME-INR
INR: 1.1 (ref 0.8–1.2)
Prothrombin Time: 13.9 seconds (ref 11.4–15.2)

## 2019-04-19 LAB — SARS CORONAVIRUS 2 (TAT 6-24 HRS): SARS Coronavirus 2: NEGATIVE

## 2019-04-19 LAB — APTT: aPTT: 36 seconds (ref 24–36)

## 2019-04-19 LAB — GLUCOSE, CAPILLARY: Glucose-Capillary: 150 mg/dL — ABNORMAL HIGH (ref 70–99)

## 2019-04-19 NOTE — Progress Notes (Addendum)
PCP - no PCP per pt. Cardiologist - denies  Chest x-ray - 04/19/19 EKG - 06/20/18 Stress Test - denies ECHO - 09/09/11 Cath-denies  Sleep Study - denies  Per pt, has been off Plavix for one month. Aspirin Instructions: Patient instructed to hold all Aspirin, NSAID's, herbal medications, fish oil and vitamins 7 days prior to surgery.   Anesthesia review:   Patient denies shortness of breath, fever, cough and chest pain at PAT appointment   Patient verbalized understanding of instructions that were given to them at the PAT appointment. Patient was also instructed that they will need to review over the PAT instructions again at home before surgery.

## 2019-04-20 ENCOUNTER — Encounter (HOSPITAL_COMMUNITY): Payer: Self-pay

## 2019-04-21 ENCOUNTER — Ambulatory Visit (HOSPITAL_COMMUNITY): Payer: Medicare Other

## 2019-04-21 ENCOUNTER — Encounter (HOSPITAL_COMMUNITY)
Admission: RE | Disposition: A | Discharge status: Home / Self Care | Payer: Self-pay | Source: Home / Self Care | Attending: Thoracic Surgery (Cardiothoracic Vascular Surgery)

## 2019-04-21 ENCOUNTER — Ambulatory Visit (HOSPITAL_COMMUNITY)
Admission: RE | Admit: 2019-04-21 | Discharge: 2019-04-21 | Disposition: A | Discharge status: Home / Self Care | Payer: Medicare Other | Attending: Thoracic Surgery (Cardiothoracic Vascular Surgery) | Admitting: Thoracic Surgery (Cardiothoracic Vascular Surgery)

## 2019-04-21 ENCOUNTER — Ambulatory Visit (HOSPITAL_COMMUNITY): Payer: Medicare Other | Admitting: Anesthesiology

## 2019-04-21 ENCOUNTER — Other Ambulatory Visit: Payer: Self-pay

## 2019-04-21 ENCOUNTER — Ambulatory Visit (HOSPITAL_COMMUNITY): Payer: Medicare Other | Admitting: Vascular Surgery

## 2019-04-21 DIAGNOSIS — Z419 Encounter for procedure for purposes other than remedying health state, unspecified: Secondary | ICD-10-CM

## 2019-04-21 DIAGNOSIS — C3412 Malignant neoplasm of upper lobe, left bronchus or lung: Secondary | ICD-10-CM | POA: Insufficient documentation

## 2019-04-21 DIAGNOSIS — Z89411 Acquired absence of right great toe: Secondary | ICD-10-CM | POA: Diagnosis not present

## 2019-04-21 DIAGNOSIS — Z7982 Long term (current) use of aspirin: Secondary | ICD-10-CM | POA: Diagnosis not present

## 2019-04-21 DIAGNOSIS — Z85828 Personal history of other malignant neoplasm of skin: Secondary | ICD-10-CM | POA: Insufficient documentation

## 2019-04-21 DIAGNOSIS — Z85818 Personal history of malignant neoplasm of other sites of lip, oral cavity, and pharynx: Secondary | ICD-10-CM | POA: Insufficient documentation

## 2019-04-21 DIAGNOSIS — R911 Solitary pulmonary nodule: Secondary | ICD-10-CM

## 2019-04-21 DIAGNOSIS — E119 Type 2 diabetes mellitus without complications: Secondary | ICD-10-CM | POA: Diagnosis not present

## 2019-04-21 DIAGNOSIS — Z87891 Personal history of nicotine dependence: Secondary | ICD-10-CM | POA: Insufficient documentation

## 2019-04-21 DIAGNOSIS — R918 Other nonspecific abnormal finding of lung field: Secondary | ICD-10-CM | POA: Diagnosis present

## 2019-04-21 HISTORY — PX: VIDEO BRONCHOSCOPY WITH ENDOBRONCHIAL ULTRASOUND: SHX6177

## 2019-04-21 HISTORY — PX: VIDEO BRONCHOSCOPY WITH ENDOBRONCHIAL NAVIGATION: SHX6175

## 2019-04-21 LAB — GLUCOSE, CAPILLARY
Glucose-Capillary: 166 mg/dL — ABNORMAL HIGH (ref 70–99)
Glucose-Capillary: 162 mg/dL — ABNORMAL HIGH (ref 70–99)

## 2019-04-21 SURGERY — VIDEO BRONCHOSCOPY WITH ENDOBRONCHIAL NAVIGATION
Anesthesia: General

## 2019-04-21 MED ORDER — EPHEDRINE SULFATE 50 MG/ML IJ SOLN
INTRAMUSCULAR | Status: DC | PRN
  Administered 2019-04-21: 10 mg via INTRAVENOUS

## 2019-04-21 MED ORDER — EPINEPHRINE PF 1 MG/ML IJ SOLN
INTRAMUSCULAR | Status: AC
  Filled 2019-04-21: qty 1

## 2019-04-21 MED ORDER — PHENYLEPHRINE 40 MCG/ML (10ML) SYRINGE FOR IV PUSH (FOR BLOOD PRESSURE SUPPORT)
PREFILLED_SYRINGE | INTRAVENOUS | Status: AC
  Filled 2019-04-21: qty 20

## 2019-04-21 MED ORDER — FENTANYL CITRATE (PF) 250 MCG/5ML IJ SOLN
INTRAMUSCULAR | Status: AC
  Filled 2019-04-21: qty 5

## 2019-04-21 MED ORDER — PROPOFOL 10 MG/ML IV BOLUS
INTRAVENOUS | Status: AC
  Filled 2019-04-21: qty 20

## 2019-04-21 MED ORDER — LIDOCAINE 2% (20 MG/ML) 5 ML SYRINGE
INTRAMUSCULAR | Status: DC | PRN
  Administered 2019-04-21: 80 mg via INTRAVENOUS

## 2019-04-21 MED ORDER — SUGAMMADEX SODIUM 200 MG/2ML IV SOLN
INTRAVENOUS | Status: DC | PRN
  Administered 2019-04-21: 200 mg via INTRAVENOUS

## 2019-04-21 MED ORDER — ROCURONIUM BROMIDE 10 MG/ML (PF) SYRINGE
PREFILLED_SYRINGE | INTRAVENOUS | Status: AC
  Filled 2019-04-21: qty 20

## 2019-04-21 MED ORDER — PROPOFOL 10 MG/ML IV BOLUS
INTRAVENOUS | Status: DC | PRN
  Administered 2019-04-21: 150 mg via INTRAVENOUS

## 2019-04-21 MED ORDER — FENTANYL CITRATE (PF) 100 MCG/2ML IJ SOLN
INTRAMUSCULAR | Status: DC | PRN
  Administered 2019-04-21: 100 ug via INTRAVENOUS
  Administered 2019-04-21: 50 ug via INTRAVENOUS

## 2019-04-21 MED ORDER — SUCCINYLCHOLINE CHLORIDE 200 MG/10ML IV SOSY
PREFILLED_SYRINGE | INTRAVENOUS | Status: AC
  Filled 2019-04-21: qty 10

## 2019-04-21 MED ORDER — PHENYLEPHRINE HCL (PRESSORS) 10 MG/ML IV SOLN
INTRAVENOUS | Status: DC | PRN
  Administered 2019-04-21: 80 ug via INTRAVENOUS

## 2019-04-21 MED ORDER — ONDANSETRON HCL 4 MG/2ML IJ SOLN
INTRAMUSCULAR | Status: AC
  Filled 2019-04-21: qty 4

## 2019-04-21 MED ORDER — MIDAZOLAM HCL 5 MG/5ML IJ SOLN
INTRAMUSCULAR | Status: DC | PRN
  Administered 2019-04-21: 2 mg via INTRAVENOUS

## 2019-04-21 MED ORDER — ONDANSETRON HCL 4 MG/2ML IJ SOLN
4.0000 mg | Freq: Once | INTRAMUSCULAR | Status: AC
  Administered 2019-04-21: 12:00:00 4 mg via INTRAVENOUS

## 2019-04-21 MED ORDER — LACTATED RINGERS IV SOLN
INTRAVENOUS | Status: DC | PRN
  Administered 2019-04-21: 10:00:00 via INTRAVENOUS

## 2019-04-21 MED ORDER — SUCCINYLCHOLINE CHLORIDE 20 MG/ML IJ SOLN
INTRAMUSCULAR | Status: DC | PRN
  Administered 2019-04-21: 100 mg via INTRAVENOUS

## 2019-04-21 MED ORDER — MIDAZOLAM HCL 2 MG/2ML IJ SOLN
INTRAMUSCULAR | Status: AC
  Filled 2019-04-21: qty 2

## 2019-04-21 MED ORDER — FENTANYL CITRATE (PF) 100 MCG/2ML IJ SOLN
INTRAMUSCULAR | Status: AC
  Administered 2019-04-21: 25 ug via INTRAVENOUS
  Filled 2019-04-21: qty 2

## 2019-04-21 MED ORDER — ROCURONIUM BROMIDE 50 MG/5ML IV SOSY
PREFILLED_SYRINGE | INTRAVENOUS | Status: DC | PRN
  Administered 2019-04-21: 50 mg via INTRAVENOUS

## 2019-04-21 MED ORDER — DEXAMETHASONE SODIUM PHOSPHATE 10 MG/ML IJ SOLN
INTRAMUSCULAR | Status: AC
  Filled 2019-04-21: qty 2

## 2019-04-21 MED ORDER — LIDOCAINE 2% (20 MG/ML) 5 ML SYRINGE
INTRAMUSCULAR | Status: AC
  Filled 2019-04-21: qty 10

## 2019-04-21 MED ORDER — SODIUM CHLORIDE 0.9 % IV SOLN
INTRAVENOUS | Status: DC | PRN
  Administered 2019-04-21: 50 ug/min via INTRAVENOUS

## 2019-04-21 MED ORDER — 0.9 % SODIUM CHLORIDE (POUR BTL) OPTIME
TOPICAL | Status: DC | PRN
  Administered 2019-04-21: 1000 mL

## 2019-04-21 MED ORDER — ONDANSETRON HCL 4 MG/2ML IJ SOLN
INTRAMUSCULAR | Status: AC
  Administered 2019-04-21: 4 mg via INTRAVENOUS
  Filled 2019-04-21: qty 2

## 2019-04-21 MED ORDER — FENTANYL CITRATE (PF) 100 MCG/2ML IJ SOLN
25.0000 ug | INTRAMUSCULAR | Status: DC | PRN
  Administered 2019-04-21: 12:00:00 25 ug via INTRAVENOUS

## 2019-04-21 SURGICAL SUPPLY — 56 items
ADAPTER BRONCHOSCOPE OLYMPUS (ADAPTER) ×2 IMPLANT
ADAPTER VALVE BIOPSY EBUS (MISCELLANEOUS) IMPLANT
ADPR BSCP OLMPS EDG (ADAPTER) ×1
ADPTR VALVE BIOPSY EBUS (MISCELLANEOUS)
BLADE CLIPPER SURG (BLADE) ×2 IMPLANT
BRUSH BIOPSY BRONCH 10 SDTNB (MISCELLANEOUS) IMPLANT
BRUSH CYTOL CELLEBRITY 1.5X140 (MISCELLANEOUS) IMPLANT
BRUSH SUPERTRAX BIOPSY (INSTRUMENTS) IMPLANT
BRUSH SUPERTRAX NDL-TIP CYTO (INSTRUMENTS) ×2 IMPLANT
CANISTER SUCT 3000ML PPV (MISCELLANEOUS) ×3 IMPLANT
CHANNEL WORK EXTEND EDGE 180 (KITS) ×1 IMPLANT
CHANNEL WORK EXTEND EDGE 90 (KITS) IMPLANT
CONT SPEC 4OZ CLIKSEAL STRL BL (MISCELLANEOUS) ×5 IMPLANT
COVER BACK TABLE 60X90IN (DRAPES) ×3 IMPLANT
COVER WAND RF STERILE (DRAPES) ×1 IMPLANT
FILTER STRAW FLUID ASPIR (MISCELLANEOUS) IMPLANT
FORCEPS BIOP RJ4 1.8 (CUTTING FORCEPS) IMPLANT
FORCEPS BIOP SUPERTRX PREMAR (INSTRUMENTS) ×1 IMPLANT
FORCEPS RADIAL JAW LRG 4 PULM (INSTRUMENTS) IMPLANT
GAUZE SPONGE 4X4 12PLY STRL (GAUZE/BANDAGES/DRESSINGS) ×2 IMPLANT
GLOVE SURG SIGNA 7.5 PF LTX (GLOVE) ×4 IMPLANT
GOWN STRL REUS W/ TWL XL LVL3 (GOWN DISPOSABLE) ×2 IMPLANT
GOWN STRL REUS W/TWL XL LVL3 (GOWN DISPOSABLE) ×2
KIT CLEAN ENDO COMPLIANCE (KITS) ×5 IMPLANT
KIT PROCEDURE EDGE 180 (KITS) IMPLANT
KIT PROCEDURE EDGE 90 (KITS) ×1 IMPLANT
KIT TURNOVER KIT B (KITS) ×4 IMPLANT
MARKER SKIN DUAL TIP RULER LAB (MISCELLANEOUS) ×3 IMPLANT
NDL ASPIRATION VIZISHOT 19G (NEEDLE) IMPLANT
NDL ASPIRATION VIZISHOT 21G (NEEDLE) ×1 IMPLANT
NDL BLUNT 18X1 FOR OR ONLY (NEEDLE) IMPLANT
NDL SUPERTRX PREMARK BIOPSY (NEEDLE) IMPLANT
NEEDLE ASPIRATION VIZISHOT 19G (NEEDLE) IMPLANT
NEEDLE ASPIRATION VIZISHOT 21G (NEEDLE) ×2 IMPLANT
NEEDLE BLUNT 18X1 FOR OR ONLY (NEEDLE) IMPLANT
NEEDLE SUPERTRX PREMARK BIOPSY (NEEDLE) ×2 IMPLANT
NS IRRIG 1000ML POUR BTL (IV SOLUTION) ×4 IMPLANT
OIL SILICONE PENTAX (PARTS (SERVICE/REPAIRS)) ×3 IMPLANT
PAD ARMBOARD 7.5X6 YLW CONV (MISCELLANEOUS) ×8 IMPLANT
PATCHES PATIENT (LABEL) ×5 IMPLANT
RADIAL JAW LRG 4 PULMONARY (INSTRUMENTS)
SYR 20ML ECCENTRIC (SYRINGE) ×3 IMPLANT
SYR 20ML LL LF (SYRINGE) ×3 IMPLANT
SYR 30ML LL (SYRINGE) ×2 IMPLANT
SYR 3ML LL SCALE MARK (SYRINGE) IMPLANT
SYR 5ML LL (SYRINGE) ×2 IMPLANT
SYR 5ML LUER SLIP (SYRINGE) ×1 IMPLANT
TOWEL GREEN STERILE (TOWEL DISPOSABLE) ×3 IMPLANT
TOWEL GREEN STERILE FF (TOWEL DISPOSABLE) ×3 IMPLANT
TRAP SPECIMEN MUCOUS 40CC (MISCELLANEOUS) ×2 IMPLANT
TUBE CONNECTING 20X1/4 (TUBING) ×6 IMPLANT
UNDERPAD 30X30 (UNDERPADS AND DIAPERS) ×2 IMPLANT
VALVE BIOPSY  SINGLE USE (MISCELLANEOUS) ×2
VALVE BIOPSY SINGLE USE (MISCELLANEOUS) ×2 IMPLANT
VALVE SUCTION BRONCHIO DISP (MISCELLANEOUS) ×4 IMPLANT
WATER STERILE IRR 1000ML POUR (IV SOLUTION) ×3 IMPLANT

## 2019-04-21 NOTE — Anesthesia Postprocedure Evaluation (Signed)
Anesthesia Post Note  Patient: [name]  Procedure(s) Performed: VIDEO BRONCHOSCOPY WITH ENDOBRONCHIAL NAVIGATION (N/A ) VIDEO BRONCHOSCOPY WITH ENDOBRONCHIAL ULTRASOUND (N/A )     Patient location during evaluation: PACU Anesthesia Type: General Level of consciousness: awake Pain management: pain level controlled Vital Signs Assessment: post-procedure vital signs reviewed and stable Respiratory status: spontaneous breathing Cardiovascular status: stable Postop Assessment: no apparent nausea or vomiting Anesthetic complications: no    Last Vitals:  Vitals:   04/21/19 1230 04/21/19 1245  BP:  120/72  Pulse: 73 78  Resp: 19 19  Temp: 36.9 C   SpO2: 100% 100%    Last Pain:  Vitals:   04/21/19 1230  TempSrc:   PainSc: 0-No pain                 Takeru Bose

## 2019-04-21 NOTE — Transfer of Care (Signed)
Immediate Anesthesia Transfer of Care Note  Patient: [name]  Procedure(s) Performed: VIDEO BRONCHOSCOPY WITH ENDOBRONCHIAL NAVIGATION (N/A ) VIDEO BRONCHOSCOPY WITH ENDOBRONCHIAL ULTRASOUND (N/A )  Patient Location: PACU  Anesthesia Type:General  Level of Consciousness: awake, alert  and oriented  Airway & Oxygen Therapy: Patient Spontanous Breathing and Patient connected to nasal cannula oxygen  Post-op Assessment: Report given to RN, Post -op Vital signs reviewed and stable and Patient moving all extremities X 4  Post vital signs: Reviewed and stable  Last Vitals:  Vitals Value Taken Time  BP 108/68 04/21/19 1154  Temp    Pulse 80 04/21/19 1155  Resp 18 04/21/19 1155  SpO2 100 % 04/21/19 1155  Vitals shown include unvalidated device data.  Last Pain:  Vitals:   04/21/19 1001  TempSrc:   PainSc: 0-No pain      Patients Stated Pain Goal: 3 (95/74/73 [id number])  Complications: No apparent anesthesia complications

## 2019-04-21 NOTE — Brief Op Note (Signed)
04/21/2019  11:43 AM  PATIENT:  [name]  63 y.o. male  PRE-OPERATIVE DIAGNOSIS:  LUL NODULE  POST-OPERATIVE DIAGNOSIS:  Non-Small Cell Carcinoma Left upper lobe, clinical stage IIIA (T2,N2)  PROCEDURE:  Procedure(s): VIDEO BRONCHOSCOPY WITH ENDOBRONCHIAL NAVIGATION (N/A) VIDEO BRONCHOSCOPY WITH ENDOBRONCHIAL ULTRASOUND (N/A) Needle aspirations, brushings and transbronchial biopsies  SURGEON:  Surgeon(s) and Role:    * Melrose Nakayama, MD - Primary  PHYSICIAN ASSISTANT:   ASSISTANTS: none   ANESTHESIA:   general  EBL:  5 mL   BLOOD ADMINISTERED:none  DRAINS: none   LOCAL MEDICATIONS USED:  NONE  SPECIMEN:  Source of Specimen:  4L node, LUL nodule  DISPOSITION OF SPECIMEN:  PATHOLOGY  COUNTS:  NO endo  TOURNIQUET:  * No tourniquets in log *  DICTATION: .Other Dictation: Dictation Number -  PLAN OF CARE: Discharge to home after PACU  PATIENT DISPOSITION:  PACU - hemodynamically stable.   Delay start of Pharmacological VTE agent (>24hrs) due to surgical blood loss or risk of bleeding: not applicable

## 2019-04-21 NOTE — Anesthesia Procedure Notes (Signed)
Procedure Name: Intubation Date/Time: 04/21/2019 10:32 AM Performed by: Neldon Newport, CRNA Pre-anesthesia Checklist: Timeout performed, Patient being monitored, Suction available, Emergency Drugs available and Patient identified Patient Re-evaluated:Patient Re-evaluated prior to induction Oxygen Delivery Method: Circle system utilized Preoxygenation: Pre-oxygenation with 100% oxygen Induction Type: IV induction Ventilation: Mask ventilation without difficulty Laryngoscope Size: Mac and 4 Grade View: Grade I Tube type: Oral Tube size: 8.5 mm Number of attempts: 1 Placement Confirmation: breath sounds checked- equal and bilateral,  positive ETCO2 and ETT inserted through vocal cords under direct vision Secured at: 23 cm Tube secured with: Tape Dental Injury: Teeth and Oropharynx as per pre-operative assessment

## 2019-04-21 NOTE — Discharge Instructions (Signed)
Do not drive or engage in heavy physical activity for 24 hours  You may resume normal activities tomorrow  You may cough up small amounts of blood over the next few days  You may use acetaminophen (Tylenol) if needed for discomfort  You may use cough medication if needed  Call [phone] if you develop chest pain, shortness of breath, fever > 101 F or cough up more than 2 tablespoons of blood  Follow up with Dr. Isidore Moos

## 2019-04-21 NOTE — Interval H&P Note (Signed)
History and Physical Interval Note:  04/21/2019 9:41 AM  [name]  has presented today for surgery, with the diagnosis of LUL NODULE.  The various methods of treatment have been discussed with the patient and family. After consideration of risks, benefits and other options for treatment, the patient has consented to  Procedure(s): VIDEO BRONCHOSCOPY WITH ENDOBRONCHIAL NAVIGATION (N/A) VIDEO BRONCHOSCOPY WITH ENDOBRONCHIAL ULTRASOUND (N/A) as a surgical intervention.  The patient's history has been reviewed, patient examined, no change in status, stable for surgery.  I have reviewed the patient's chart and labs.  Questions were answered to the patient's satisfaction.     Melrose Nakayama

## 2019-04-21 NOTE — Anesthesia Preprocedure Evaluation (Addendum)
Anesthesia Evaluation  Patient identified by MRN, date of birth, ID band Patient awake    Reviewed: Allergy & Precautions, NPO status , Patient's Chart, lab work & pertinent test results  Airway Mallampati: II  TM Distance: >3 FB     Dental  (+) Dental Advidsory Given   Pulmonary pneumonia, former smoker,    breath sounds clear to auscultation       Cardiovascular negative cardio ROS   Rhythm:Regular Rate:Normal     Neuro/Psych    GI/Hepatic negative GI ROS, Neg liver ROS,   Endo/Other  diabetes  Renal/GU      Musculoskeletal   Abdominal   Peds  Hematology   Anesthesia Other Findings   Reproductive/Obstetrics                            Anesthesia Physical Anesthesia Plan  ASA: III  Anesthesia Plan:    Post-op Pain Management:    Induction: Intravenous  PONV Risk Score and Plan: 2 and Ondansetron, Dexamethasone and Midazolam  Airway Management Planned: Oral ETT  Additional Equipment:   Intra-op Plan:   Post-operative Plan:   Informed Consent: I have reviewed the patients History and Physical, chart, labs and discussed the procedure including the risks, benefits and alternatives for the proposed anesthesia with the patient or authorized representative who has indicated his/her understanding and acceptance.     Dental advisory given and Dental Advisory Given  Plan Discussed with: CRNA and Anesthesiologist  Anesthesia Plan Comments:        Anesthesia Quick Evaluation

## 2019-04-22 ENCOUNTER — Encounter (HOSPITAL_COMMUNITY): Payer: Self-pay | Admitting: Thoracic Surgery (Cardiothoracic Vascular Surgery)

## 2019-04-22 NOTE — Op Note (Signed)
NAME: Devin, Fischer MEDICAL RECORD XT:[id number] ACCOUNT [id number] DATE OF BIRTH:February 17, 1956 FACILITY: [facility] LOCATION: [facility] PHYSICIAN:Treveon Bourcier Devin Jan, MD  OPERATIVE REPORT  DATE OF PROCEDURE:  04/21/2019  PREOPERATIVE DIAGNOSIS:  Left upper lobe mass with paratracheal adenopathy.  POSTOPERATIVE DIAGNOSIS:  Nonsmall-cell carcinoma, left upper lobe.  PROCEDURE:   1. Electromagnetic navigational bronchoscopy with needle aspirations, brushings, and transbronchial biopsies and 2.Endobronchial ultrasound with mediastinal lymph node aspiration.  SURGEON:  Modesto Charon, MD  ASSISTANT:  None.  ANESTHESIA:  General.  FINDINGS:  Aspirations obtained from level 4L lymph node were indeterminate.  Small node with narrow window for aspiration.  Needle aspirations from left upper lobe mass were sufficient for a diagnosis and contained malignant cells.  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  He has a spiculated left upper lobe mass with a paratracheal node that is not enlarged, but it was hypermetabolic on PET  CT.  He was referred for bronchoscopy and endobronchial ultrasound for diagnostic and staging purposes.  The indications, risks, benefits, and alternatives were discussed in detail with the patient.  He understood and accepted the risks and agreed to  proceed.  DESCRIPTION OF PROCEDURE:  The patient was brought to the operating room on 04/21/2019.  He had induction of general anesthesia.  He was intubated.  Sequential compression devices were placed on the calves for DVT prophylaxis.  Planning for the  navigational bronchoscopy was done on the console prior to initiating the procedure.  A time-out was performed.  Flexible fiberoptic bronchoscopy was performed via the endotracheal tube.  It revealed normal endobronchial anatomy.  There were some thick clear secretions.  No  endobronchial lesions to the level of the subsegmental bronchi.  The endobronchial ultrasound probe was advanced.  Inspection in the subcarinal space only showed the esophagus.  No nodes were seen in the 4R area.  On the left peritracheal 4L station, a small node between the aorta and pulmonary artery was identified.   Three aspirations were obtained from this node.  Two were done with suction applied and 1 without.  Aspiration was performed with real-time ultrasound guidance.  Quick prep of those slides was nondiagnostic. Because of the small size of the node and  surrounding vessels, no additional attempts were made to aspirate.  The bronchoscope was replaced.  The locatable guide for navigation was placed.  Registration was performed.  Bronchoscope then was directed to the left upper lobe bronchus, and the appropriate anterior subsegmental bronchus was cannulated.  Locatable  guide was advanced to within 5 mm of the center of the mass.  Needle aspirations were obtained.  All sampling of the lung nodule was done with fluoroscopy.  Three needle aspirations were obtained.  While those were being prepared, 3 needle brushings were  performed.  After every third sample, the locatable guide was reinserted to ensure continued proximity and alignment with the mass.  The needle aspiration showed sufficient diagnostic material for malignancy, probable nonsmall-cell carcinoma.  Multiple  biopsies were then obtained, again re-placing the locatable guide after every third sampling.  These were all sent for permanent pathology.  The total fluoroscopy time was 1.8 minutes and the dose was 9 mGy.  At the completion of the procedure, there was  no evidence of pneumothorax.  A small amount of blood in the airway was irrigated with saline, and there was no ongoing bleeding.  The patient was extubated in the  operating room and taken to the postanesthetic care unit in good condition.  LN/NUANCE  D:04/21/2019 T:04/22/2019  JOB:[id number]

## 2019-04-25 LAB — CYTOLOGY - NON PAP

## 2019-04-25 LAB — SURGICAL PATHOLOGY

## 2019-04-29 DIAGNOSIS — C07 Malignant neoplasm of parotid gland: Secondary | ICD-10-CM | POA: Diagnosis not present

## 2019-05-02 ENCOUNTER — Ambulatory Visit (INDEPENDENT_AMBULATORY_CARE_PROVIDER_SITE_OTHER): Payer: Medicare Other | Admitting: Orthopedic Surgery

## 2019-05-02 ENCOUNTER — Encounter: Payer: Self-pay | Admitting: *Deleted

## 2019-05-02 ENCOUNTER — Ambulatory Visit: Admission: RE | Admit: 2019-05-02 | Payer: Medicare Other | Source: Ambulatory Visit | Admitting: Radiation Oncology

## 2019-05-02 ENCOUNTER — Encounter: Payer: Self-pay | Admitting: Orthopedic Surgery

## 2019-05-02 ENCOUNTER — Other Ambulatory Visit: Payer: Self-pay

## 2019-05-02 VITALS — Ht 68.5 in | Wt 159.0 lb

## 2019-05-02 DIAGNOSIS — T8781 Dehiscence of amputation stump: Secondary | ICD-10-CM

## 2019-05-02 DIAGNOSIS — I96 Gangrene, not elsewhere classified: Secondary | ICD-10-CM

## 2019-05-02 MED ORDER — GABAPENTIN 300 MG PO CAPS: 300.0000 mg | ORAL_CAPSULE | Freq: Three times a day (TID) | ORAL | 3 refills | Status: DC

## 2019-05-02 NOTE — Progress Notes (Signed)
Oncology Nurse Navigator Documentation  Met with [name] for New Start RT.  He arrived in [facility].  He was extremely anxious and upset, reported he had just rec'd call he is going to have R BKA on Wednesday, will be in the hospital for several days before being [place] to rehab.  I informed Dr. Isidore Moos and per her guidance, informed RTTs today's New Start cancelled and will be rescheduled for early next week.  I explained plan to [name], he expressed understanding and relief.  I escorted him to lobby where he contacted wife for return ride home.  [name], RN, BSN Head & Neck Oncology Nurse [place] [facility] [place] [phone]

## 2019-05-02 NOTE — Addendum Note (Signed)
Addended by: Meridee Score on: 05/02/2019 12:58 PM   Modules accepted: Orders
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
## 2019-05-03 ENCOUNTER — Other Ambulatory Visit: Payer: Self-pay

## 2019-05-03 ENCOUNTER — Other Ambulatory Visit (HOSPITAL_COMMUNITY)
Admission: RE | Admit: 2019-05-03 | Discharge: 2019-05-03 | Disposition: A | Discharge status: Home / Self Care | Payer: Medicare Other | Source: Ambulatory Visit | Attending: Orthopedic Surgery | Admitting: Orthopedic Surgery

## 2019-05-03 ENCOUNTER — Ambulatory Visit: Payer: Medicare Other

## 2019-05-03 ENCOUNTER — Encounter (HOSPITAL_COMMUNITY): Payer: Self-pay | Admitting: *Deleted

## 2019-05-03 ENCOUNTER — Other Ambulatory Visit: Payer: Self-pay | Admitting: Family

## 2019-05-03 DIAGNOSIS — Z01812 Encounter for preprocedural laboratory examination: Secondary | ICD-10-CM | POA: Insufficient documentation

## 2019-05-03 DIAGNOSIS — Z20828 Contact with and (suspected) exposure to other viral communicable diseases: Secondary | ICD-10-CM | POA: Insufficient documentation

## 2019-05-03 LAB — SARS CORONAVIRUS 2 (TAT 6-24 HRS): SARS Coronavirus 2: NEGATIVE

## 2019-05-03 NOTE — Progress Notes (Signed)
Spoke with pt's wife, Joycelyn Schmid for pre-op call. DPR on file. She states pt does not have a cardiac history or HTN. Pt does have type 2 diabetes. Pt does not check his blood sugar at home. Last A1C was 6.6 on 03/14/19.  Pt had Covid test done today. Joycelyn Schmid states pt is at home in quarantine.   Margaret informed of visitation policy and voiced understanding.   Instructed Margaret to get Gatorade G2 (12 fluid oz) and have pt drink it in the AM by 7:25 AM. Pt may have clear liquids until 7:25 AM, no food after midnight.

## 2019-05-04 ENCOUNTER — Ambulatory Visit: Payer: Medicare Other

## 2019-05-04 ENCOUNTER — Inpatient Hospital Stay (HOSPITAL_COMMUNITY): Payer: Medicare Other | Admitting: Anesthesiology

## 2019-05-04 ENCOUNTER — Other Ambulatory Visit: Payer: Self-pay | Admitting: Radiation Oncology

## 2019-05-04 ENCOUNTER — Encounter (HOSPITAL_COMMUNITY): Payer: Self-pay

## 2019-05-04 ENCOUNTER — Inpatient Hospital Stay (HOSPITAL_COMMUNITY)
Admission: RE | Admit: 2019-05-04 | Discharge: 2019-05-12 | DRG: 240 | Disposition: A | Discharge status: Skilled Nursing Facility | Payer: Medicare Other | Attending: Orthopedic Surgery | Admitting: Orthopedic Surgery

## 2019-05-04 ENCOUNTER — Other Ambulatory Visit: Payer: Self-pay

## 2019-05-04 ENCOUNTER — Encounter (HOSPITAL_COMMUNITY)
Admission: RE | Disposition: A | Discharge status: Skilled Nursing Facility | Payer: Self-pay | Source: Home / Self Care | Attending: Orthopedic Surgery

## 2019-05-04 DIAGNOSIS — Z8249 Family history of ischemic heart disease and other diseases of the circulatory system: Secondary | ICD-10-CM

## 2019-05-04 DIAGNOSIS — C3490 Malignant neoplasm of unspecified part of unspecified bronchus or lung: Secondary | ICD-10-CM | POA: Diagnosis not present

## 2019-05-04 DIAGNOSIS — I96 Gangrene, not elsewhere classified: Secondary | ICD-10-CM | POA: Diagnosis present

## 2019-05-04 DIAGNOSIS — Z85118 Personal history of other malignant neoplasm of bronchus and lung: Secondary | ICD-10-CM

## 2019-05-04 DIAGNOSIS — Z20828 Contact with and (suspected) exposure to other viral communicable diseases: Secondary | ICD-10-CM | POA: Diagnosis present

## 2019-05-04 DIAGNOSIS — M542 Cervicalgia: Secondary | ICD-10-CM | POA: Diagnosis present

## 2019-05-04 DIAGNOSIS — Z825 Family history of asthma and other chronic lower respiratory diseases: Secondary | ICD-10-CM | POA: Diagnosis not present

## 2019-05-04 DIAGNOSIS — Z7984 Long term (current) use of oral hypoglycemic drugs: Secondary | ICD-10-CM

## 2019-05-04 DIAGNOSIS — Z7982 Long term (current) use of aspirin: Secondary | ICD-10-CM | POA: Diagnosis not present

## 2019-05-04 DIAGNOSIS — M1990 Unspecified osteoarthritis, unspecified site: Secondary | ICD-10-CM | POA: Diagnosis present

## 2019-05-04 DIAGNOSIS — S88111A Complete traumatic amputation at level between knee and ankle, right lower leg, initial encounter: Secondary | ICD-10-CM

## 2019-05-04 DIAGNOSIS — F17210 Nicotine dependence, cigarettes, uncomplicated: Secondary | ICD-10-CM | POA: Diagnosis present

## 2019-05-04 DIAGNOSIS — T8130XA Disruption of wound, unspecified, initial encounter: Secondary | ICD-10-CM | POA: Diagnosis not present

## 2019-05-04 DIAGNOSIS — Z79899 Other long term (current) drug therapy: Secondary | ICD-10-CM | POA: Diagnosis not present

## 2019-05-04 DIAGNOSIS — E1152 Type 2 diabetes mellitus with diabetic peripheral angiopathy with gangrene: Principal | ICD-10-CM | POA: Diagnosis present

## 2019-05-04 DIAGNOSIS — M5126 Other intervertebral disc displacement, lumbar region: Secondary | ICD-10-CM | POA: Diagnosis present

## 2019-05-04 DIAGNOSIS — C07 Malignant neoplasm of parotid gland: Secondary | ICD-10-CM

## 2019-05-04 DIAGNOSIS — N400 Enlarged prostate without lower urinary tract symptoms: Secondary | ICD-10-CM | POA: Diagnosis not present

## 2019-05-04 HISTORY — DX: Peripheral vascular disease, unspecified: I73.9

## 2019-05-04 HISTORY — PX: AMPUTATION: SHX166

## 2019-05-04 LAB — BASIC METABOLIC PANEL
Anion gap: 10 (ref 5–15)
BUN: 8 mg/dL (ref 8–23)
CO2: 27 mmol/L (ref 22–32)
Calcium: 8.6 mg/dL — ABNORMAL LOW (ref 8.9–10.3)
Chloride: 103 mmol/L (ref 98–111)
Creatinine, Ser: 0.74 mg/dL (ref 0.61–1.24)
GFR calc Af Amer: >60 mL/min (ref >60)
GFR calc non Af Amer: >60 mL/min (ref >60)
Glucose, Bld: 176 mg/dL — ABNORMAL HIGH (ref 70–99)
Potassium: 4.1 mmol/L (ref 3.5–5.1)
Sodium: 140 mmol/L (ref 135–145)

## 2019-05-04 LAB — GLUCOSE, CAPILLARY
Glucose-Capillary: 176 mg/dL — ABNORMAL HIGH (ref 70–99)
Glucose-Capillary: 161 mg/dL — ABNORMAL HIGH (ref 70–99)
Glucose-Capillary: 173 mg/dL — ABNORMAL HIGH (ref 70–99)
Glucose-Capillary: 162 mg/dL — ABNORMAL HIGH (ref 70–99)
Glucose-Capillary: 151 mg/dL — ABNORMAL HIGH (ref 70–99)

## 2019-05-04 LAB — CBC
HCT: 34.5 % — ABNORMAL LOW (ref 39.0–52.0)
Hemoglobin: 11.4 g/dL — ABNORMAL LOW (ref 13.0–17.0)
MCH: 31.1 pg (ref 26.0–34.0)
MCHC: 33 g/dL (ref 30.0–36.0)
MCV: 94 fL (ref 80.0–100.0)
Platelets: 370 10*3/uL (ref 150–400)
RBC: 3.67 MIL/uL — ABNORMAL LOW (ref 4.22–5.81)
RDW: 14.4 % (ref 11.5–15.5)
WBC: 14.2 10*3/uL — ABNORMAL HIGH (ref 4.0–10.5)
nRBC: 0 % (ref 0.0–0.2)

## 2019-05-04 SURGERY — AMPUTATION BELOW KNEE
Anesthesia: General | Site: Knee | Laterality: Right

## 2019-05-04 MED ORDER — DOCUSATE SODIUM 100 MG PO CAPS
100.0000 mg | ORAL_CAPSULE | Freq: Two times a day (BID) | ORAL | Status: DC
  Administered 2019-05-04 – 2019-05-12 (×16): 100 mg via ORAL
  Filled 2019-05-04 (×16): qty 1

## 2019-05-04 MED ORDER — ONDANSETRON HCL 4 MG/2ML IJ SOLN
INTRAMUSCULAR | Status: DC | PRN
  Administered 2019-05-04: 4 mg via INTRAVENOUS

## 2019-05-04 MED ORDER — MIDAZOLAM HCL 2 MG/2ML IJ SOLN
INTRAMUSCULAR | Status: AC
  Administered 2019-05-04: 1 mg via INTRAVENOUS
  Filled 2019-05-04: qty 2

## 2019-05-04 MED ORDER — LIDOCAINE 2% (20 MG/ML) 5 ML SYRINGE
INTRAMUSCULAR | Status: DC | PRN
  Administered 2019-05-04: 20 mg via INTRAVENOUS

## 2019-05-04 MED ORDER — LORAZEPAM 0.5 MG PO TABS: ORAL_TABLET | ORAL | 0 refills | Status: DC

## 2019-05-04 MED ORDER — FENTANYL CITRATE (PF) 250 MCG/5ML IJ SOLN
INTRAMUSCULAR | Status: AC
  Filled 2019-05-04: qty 5

## 2019-05-04 MED ORDER — LORAZEPAM 0.5 MG PO TABS
0.5000 mg | ORAL_TABLET | Freq: Four times a day (QID) | ORAL | Status: DC | PRN
  Administered 2019-05-05 – 2019-05-11 (×5): 0.5 mg via ORAL
  Filled 2019-05-04 (×5): qty 1

## 2019-05-04 MED ORDER — PROMETHAZINE HCL 25 MG/ML IJ SOLN: 6.2500 mg | INTRAMUSCULAR | Status: DC | PRN

## 2019-05-04 MED ORDER — ONDANSETRON HCL 4 MG PO TABS: 4.0000 mg | ORAL_TABLET | Freq: Four times a day (QID) | ORAL | Status: DC | PRN

## 2019-05-04 MED ORDER — PHENYLEPHRINE HCL (PRESSORS) 10 MG/ML IV SOLN
INTRAVENOUS | Status: DC | PRN
  Administered 2019-05-04: 40 ug via INTRAVENOUS
  Administered 2019-05-04 (×3): 80 ug via INTRAVENOUS

## 2019-05-04 MED ORDER — SODIUM CHLORIDE 0.9 % IV SOLN
INTRAVENOUS | Status: DC
  Administered 2019-05-04: 17:00:00 via INTRAVENOUS

## 2019-05-04 MED ORDER — ROPIVACAINE HCL 5 MG/ML IJ SOLN
INTRAMUSCULAR | Status: DC | PRN
  Administered 2019-05-04: 20 mL via PERINEURAL

## 2019-05-04 MED ORDER — INSULIN ASPART 100 UNIT/ML ~~LOC~~ SOLN
0.0000 [IU] | Freq: Three times a day (TID) | SUBCUTANEOUS | Status: DC
  Administered 2019-05-04 – 2019-05-05 (×2): 3 [IU] via SUBCUTANEOUS
  Administered 2019-05-08 – 2019-05-12 (×3): 2 [IU] via SUBCUTANEOUS

## 2019-05-04 MED ORDER — METHOCARBAMOL 1000 MG/10ML IJ SOLN
500.0000 mg | Freq: Four times a day (QID) | INTRAVENOUS | Status: DC | PRN
  Filled 2019-05-04: qty 5

## 2019-05-04 MED ORDER — HYDROMORPHONE HCL 1 MG/ML IJ SOLN
0.5000 mg | INTRAMUSCULAR | Status: DC | PRN
  Administered 2019-05-05 – 2019-05-10 (×22): 1 mg via INTRAVENOUS
  Filled 2019-05-04 (×22): qty 1

## 2019-05-04 MED ORDER — GLIPIZIDE 5 MG PO TABS
10.0000 mg | ORAL_TABLET | Freq: Every day | ORAL | Status: DC
  Administered 2019-05-05 – 2019-05-12 (×8): 10 mg via ORAL
  Filled 2019-05-04 (×8): qty 2

## 2019-05-04 MED ORDER — CLOPIDOGREL BISULFATE 75 MG PO TABS
75.0000 mg | ORAL_TABLET | Freq: Every day | ORAL | Status: DC
  Administered 2019-05-06: 75 mg via ORAL
  Filled 2019-05-04 (×6): qty 1

## 2019-05-04 MED ORDER — FENTANYL CITRATE (PF) 100 MCG/2ML IJ SOLN
INTRAMUSCULAR | Status: AC
  Administered 2019-05-04: 50 ug via INTRAVENOUS
  Filled 2019-05-04: qty 2

## 2019-05-04 MED ORDER — MIDAZOLAM HCL 2 MG/2ML IJ SOLN
1.0000 mg | Freq: Once | INTRAMUSCULAR | Status: AC
  Administered 2019-05-04: 1 mg via INTRAVENOUS

## 2019-05-04 MED ORDER — CHLORHEXIDINE GLUCONATE 4 % EX LIQD: 60.0000 mL | Freq: Once | CUTANEOUS | Status: DC

## 2019-05-04 MED ORDER — ONDANSETRON HCL 4 MG/2ML IJ SOLN: 4.0000 mg | Freq: Four times a day (QID) | INTRAMUSCULAR | Status: DC | PRN

## 2019-05-04 MED ORDER — LACTATED RINGERS IV SOLN
INTRAVENOUS | Status: DC
  Administered 2019-05-04: 10:00:00 via INTRAVENOUS

## 2019-05-04 MED ORDER — ACETAMINOPHEN 325 MG PO TABS: 325.0000 mg | ORAL_TABLET | Freq: Four times a day (QID) | ORAL | Status: DC | PRN

## 2019-05-04 MED ORDER — BISACODYL 10 MG RE SUPP: 10.0000 mg | Freq: Every day | RECTAL | Status: DC | PRN

## 2019-05-04 MED ORDER — PROPOFOL 10 MG/ML IV BOLUS
INTRAVENOUS | Status: AC
  Filled 2019-05-04: qty 20

## 2019-05-04 MED ORDER — CEFAZOLIN SODIUM-DEXTROSE 1-4 GM/50ML-% IV SOLN
1.0000 g | Freq: Four times a day (QID) | INTRAVENOUS | Status: AC
  Administered 2019-05-04 – 2019-05-05 (×3): 1 g via INTRAVENOUS
  Filled 2019-05-04 (×3): qty 50

## 2019-05-04 MED ORDER — METHOCARBAMOL 500 MG PO TABS
500.0000 mg | ORAL_TABLET | Freq: Four times a day (QID) | ORAL | Status: DC | PRN
  Administered 2019-05-04 – 2019-05-12 (×14): 500 mg via ORAL
  Filled 2019-05-04 (×14): qty 1

## 2019-05-04 MED ORDER — ALBUMIN HUMAN 5 % IV SOLN
INTRAVENOUS | Status: DC | PRN
  Administered 2019-05-04: 14:00:00 via INTRAVENOUS

## 2019-05-04 MED ORDER — OXYCODONE HCL 5 MG PO TABS
10.0000 mg | ORAL_TABLET | ORAL | Status: DC | PRN
  Administered 2019-05-05 (×2): 10 mg via ORAL
  Administered 2019-05-06 (×2): 15 mg via ORAL
  Administered 2019-05-06 – 2019-05-07 (×2): 10 mg via ORAL
  Administered 2019-05-07 (×2): 15 mg via ORAL
  Administered 2019-05-07: 10 mg via ORAL
  Administered 2019-05-08 – 2019-05-12 (×21): 15 mg via ORAL
  Filled 2019-05-04 (×22): qty 3
  Filled 2019-05-04: qty 2
  Filled 2019-05-04 (×3): qty 3

## 2019-05-04 MED ORDER — FENTANYL CITRATE (PF) 100 MCG/2ML IJ SOLN: 25.0000 ug | INTRAMUSCULAR | Status: DC | PRN

## 2019-05-04 MED ORDER — INSULIN ASPART 100 UNIT/ML ~~LOC~~ SOLN
SUBCUTANEOUS | Status: AC
  Filled 2019-05-04: qty 1

## 2019-05-04 MED ORDER — ASPIRIN EC 81 MG PO TBEC
81.0000 mg | DELAYED_RELEASE_TABLET | Freq: Every day | ORAL | Status: DC
  Administered 2019-05-04 – 2019-05-12 (×9): 81 mg via ORAL
  Filled 2019-05-04 (×11): qty 1

## 2019-05-04 MED ORDER — BUPIVACAINE-EPINEPHRINE (PF) 0.5% -1:200000 IJ SOLN
INTRAMUSCULAR | Status: DC | PRN
  Administered 2019-05-04: 25 mL via PERINEURAL

## 2019-05-04 MED ORDER — METOCLOPRAMIDE HCL 5 MG PO TABS: 5.0000 mg | ORAL_TABLET | Freq: Three times a day (TID) | ORAL | Status: DC | PRN

## 2019-05-04 MED ORDER — OXYCODONE HCL 5 MG PO TABS
5.0000 mg | ORAL_TABLET | ORAL | Status: DC | PRN
  Administered 2019-05-04 – 2019-05-06 (×5): 10 mg via ORAL
  Filled 2019-05-04 (×9): qty 2

## 2019-05-04 MED ORDER — METOCLOPRAMIDE HCL 5 MG/ML IJ SOLN: 5.0000 mg | Freq: Three times a day (TID) | INTRAMUSCULAR | Status: DC | PRN

## 2019-05-04 MED ORDER — CEFAZOLIN SODIUM-DEXTROSE 2-4 GM/100ML-% IV SOLN
2.0000 g | INTRAVENOUS | Status: AC
  Administered 2019-05-04: 14:00:00 2 g via INTRAVENOUS
  Filled 2019-05-04 (×2): qty 100

## 2019-05-04 MED ORDER — GABAPENTIN 300 MG PO CAPS
300.0000 mg | ORAL_CAPSULE | Freq: Three times a day (TID) | ORAL | Status: DC
  Administered 2019-05-04 – 2019-05-12 (×24): 300 mg via ORAL
  Filled 2019-05-04 (×24): qty 1

## 2019-05-04 MED ORDER — FENTANYL CITRATE (PF) 100 MCG/2ML IJ SOLN
50.0000 ug | Freq: Once | INTRAMUSCULAR | Status: AC
  Administered 2019-05-04: 13:00:00 50 ug via INTRAVENOUS

## 2019-05-04 MED ORDER — MAGNESIUM CITRATE PO SOLN: 1.0000 | Freq: Once | ORAL | Status: DC | PRN

## 2019-05-04 MED ORDER — PROPOFOL 10 MG/ML IV BOLUS
INTRAVENOUS | Status: DC | PRN
  Administered 2019-05-04: 150 mg via INTRAVENOUS

## 2019-05-04 MED ORDER — POLYETHYLENE GLYCOL 3350 17 G PO PACK: 17.0000 g | PACK | Freq: Every day | ORAL | Status: DC | PRN

## 2019-05-04 MED ORDER — MIDAZOLAM HCL 2 MG/2ML IJ SOLN
1.0000 mg | Freq: Once | INTRAMUSCULAR | Status: AC
  Administered 2019-05-04: 13:00:00 1 mg via INTRAVENOUS

## 2019-05-04 SURGICAL SUPPLY — 37 items
BLADE SAW RECIP 87.9 MT (BLADE) ×2 IMPLANT
BLADE SURG 21 STRL SS (BLADE) ×2 IMPLANT
BNDG COHESIVE 6X5 TAN STRL LF (GAUZE/BANDAGES/DRESSINGS) IMPLANT
CANISTER WOUND CARE 500ML ATS (WOUND CARE) ×2 IMPLANT
COVER SURGICAL LIGHT HANDLE (MISCELLANEOUS) ×2 IMPLANT
COVER WAND RF STERILE (DRAPES) IMPLANT
CUFF TOURN SGL QUICK 34 (TOURNIQUET CUFF) ×2
CUFF TRNQT CYL 34X4.125X (TOURNIQUET CUFF) ×1 IMPLANT
DRAPE INCISE IOBAN 66X45 STRL (DRAPES) ×2 IMPLANT
DRAPE U-SHAPE 47X51 STRL (DRAPES) ×2 IMPLANT
DRESSING PREVENA PLUS CUSTOM (GAUZE/BANDAGES/DRESSINGS) ×1 IMPLANT
DRSG PREVENA PLUS CUSTOM (GAUZE/BANDAGES/DRESSINGS) ×2
DURAPREP 26ML APPLICATOR (WOUND CARE) ×2 IMPLANT
ELECT REM PT RETURN 9FT ADLT (ELECTROSURGICAL) ×2
ELECTRODE REM PT RTRN 9FT ADLT (ELECTROSURGICAL) ×1 IMPLANT
GLOVE BIOGEL PI IND STRL 9 (GLOVE) ×1 IMPLANT
GLOVE BIOGEL PI INDICATOR 9 (GLOVE) ×1
GLOVE SURG ORTHO 9.0 STRL STRW (GLOVE) ×2 IMPLANT
GOWN STRL REUS W/ TWL XL LVL3 (GOWN DISPOSABLE) ×2 IMPLANT
GOWN STRL REUS W/TWL XL LVL3 (GOWN DISPOSABLE) ×4
KIT BASIN OR (CUSTOM PROCEDURE TRAY) ×2 IMPLANT
KIT TURNOVER KIT B (KITS) ×2 IMPLANT
MANIFOLD NEPTUNE II (INSTRUMENTS) ×2 IMPLANT
NS IRRIG 1000ML POUR BTL (IV SOLUTION) ×2 IMPLANT
PACK ORTHO EXTREMITY (CUSTOM PROCEDURE TRAY) ×2 IMPLANT
PAD ARMBOARD 7.5X6 YLW CONV (MISCELLANEOUS) ×2 IMPLANT
PREVENA RESTOR ARTHOFORM 46X30 (CANNISTER) ×2 IMPLANT
SPONGE LAP 18X18 RF (DISPOSABLE) IMPLANT
STAPLER VISISTAT 35W (STAPLE) IMPLANT
STOCKINETTE IMPERVIOUS LG (DRAPES) ×2 IMPLANT
SUT ETHILON 2 0 PSLX (SUTURE) IMPLANT
SUT SILK 2 0 (SUTURE) ×2
SUT SILK 2-0 18XBRD TIE 12 (SUTURE) ×1 IMPLANT
SUT VIC AB 1 CTX 27 (SUTURE) ×4 IMPLANT
TOWEL GREEN STERILE (TOWEL DISPOSABLE) ×2 IMPLANT
TUBE CONNECTING 12X1/4 (SUCTIONS) ×2 IMPLANT
YANKAUER SUCT BULB TIP NO VENT (SUCTIONS) ×2 IMPLANT

## 2019-05-04 NOTE — Progress Notes (Signed)
Orthopedic Tech Progress Note Patient Details:  [name] 09/27/1955 [id number]  Patient ID: [name], male   DOB: 1955/12/07, 63 y.o.   MRN: [id number]   [name] 05/04/2019, 5:23 [place] for right stump shrinker and limb protector.

## 2019-05-04 NOTE — Progress Notes (Signed)
Yesterday I spoke with the patient's wife to inform her of the biopsy results from his lung.  We talked about the delay in his radiotherapy because he has an urgent foot at amputation this week.  Plan to start radiotherapy on Monday, October 5 instead.  There is a chance he will be in a rehabilitation center during his first week of radiotherapy.  [name], RN, our Head and Neck Oncology Navigator will be made aware of this and transportation may need to be facilitated.  Also due to the patient's anxiety and claustrophobia I have prescribed Ativan for him to take before his radiotherapy treatments.   It is unfortunate that we cannot start his radiotherapy today but the patient has multiple medical issues and his gangrenous foot presents an urgent surgical necessity.  -----------------------------------  Eppie Gibson, MD

## 2019-05-04 NOTE — Anesthesia Preprocedure Evaluation (Signed)
Anesthesia Evaluation  Patient identified by MRN, date of birth, ID band Patient awake    Reviewed: Allergy & Precautions, NPO status , Patient's Chart, lab work & pertinent test results  Airway Mallampati: II  TM Distance: >3 FB     Dental  (+) Dental Advidsory Given   Pulmonary pneumonia, Current Smoker, former smoker,    breath sounds clear to auscultation       Cardiovascular + Peripheral Vascular Disease   Rhythm:Regular Rate:Normal     Neuro/Psych    GI/Hepatic negative GI ROS, Neg liver ROS,   Endo/Other  diabetes, Type 2, Oral Hypoglycemic Agents  Renal/GU      Musculoskeletal  (+) Arthritis ,   Abdominal   Peds  Hematology  (+) anemia ,   Anesthesia Other Findings   Reproductive/Obstetrics                             Anesthesia Physical  Anesthesia Plan  ASA: III  Anesthesia Plan: General   Post-op Pain Management:  Regional for Post-op pain   Induction: Intravenous  PONV Risk Score and Plan: 2 and Ondansetron, Dexamethasone and Midazolam  Airway Management Planned: LMA  Additional Equipment:   Intra-op Plan:   Post-operative Plan: Extubation in OR  Informed Consent: I have reviewed the patients History and Physical, chart, labs and discussed the procedure including the risks, benefits and alternatives for the proposed anesthesia with the patient or authorized representative who has indicated his/her understanding and acceptance.     Dental advisory given and Dental Advisory Given  Plan Discussed with: CRNA and Anesthesiologist  Anesthesia Plan Comments:         Anesthesia Quick Evaluation

## 2019-05-04 NOTE — Plan of Care (Signed)
  Problem: Skin Integrity: Goal: Risk for impaired skin integrity will decrease Outcome: Progressing   Problem: Pain Managment: Goal: General experience of comfort will improve Outcome: Progressing   Problem: Safety: Goal: Ability to remain free from injury will improve Outcome: Progressing

## 2019-05-04 NOTE — Anesthesia Procedure Notes (Signed)
Anesthesia Regional Block: Adductor canal block   Pre-Anesthetic Checklist: ,, timeout performed, Correct Patient, Correct Site, Correct Laterality, Correct Procedure, Correct Position, site marked, Risks and benefits discussed,  Surgical consent,  Pre-op evaluation,  At surgeon's request and post-op pain management  Laterality: Right  Prep: chloraprep       Needles:  Injection technique: Single-shot  Needle Type: Echogenic Needle     Needle Length: 9cm  Needle Gauge: 21     Additional Needles:   Procedures:,,,, ultrasound used (permanent image in chart),,,,  Narrative:  Start time: 05/04/2019 12:46 PM End time: 05/04/2019 12:51 PM Injection made incrementally with aspirations every 5 mL.  Performed by: Personally  Anesthesiologist: Suzette Battiest, MD

## 2019-05-04 NOTE — Transfer of Care (Signed)
Immediate Anesthesia Transfer of Care Note  Patient: [name]  Procedure(s) Performed: RIGHT BELOW KNEE AMPUTATION (Right Knee)  Patient Location: PACU  Anesthesia Type:General and Regional  Level of Consciousness: awake, alert  and oriented  Airway & Oxygen Therapy: Patient Spontanous Breathing and Patient connected to nasal cannula oxygen  Post-op Assessment: Report given to RN and Post -op Vital signs reviewed and stable  Post vital signs: Reviewed and stable  Last Vitals:  Vitals Value Taken Time  BP    Temp    Pulse    Resp    SpO2      Last Pain:  Vitals:   05/04/19 1000  TempSrc:   PainSc: 7          Complications: No apparent anesthesia complications
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
## 2019-05-04 NOTE — Anesthesia Postprocedure Evaluation (Signed)
Anesthesia Post Note  Patient: [name]  Procedure(s) Performed: RIGHT BELOW KNEE AMPUTATION (Right Knee)     Patient location during evaluation: PACU Anesthesia Type: General Level of consciousness: awake and alert Pain management: pain level controlled Vital Signs Assessment: post-procedure vital signs reviewed and stable Respiratory status: spontaneous breathing, nonlabored ventilation, respiratory function stable and patient connected to nasal cannula oxygen Cardiovascular status: blood pressure returned to baseline and stable Postop Assessment: no apparent nausea or vomiting Anesthetic complications: no    Last Vitals:  Vitals:   05/04/19 1507 05/04/19 1624  BP: 102/64 121/75  Pulse: 76 80  Resp: (!) 22   Temp:  36.4 C  SpO2: 100% 99%    Last Pain:  Vitals:   05/04/19 1624  TempSrc: Oral  PainSc:                  [name]

## 2019-05-04 NOTE — Anesthesia Procedure Notes (Signed)
Anesthesia Regional Block: Popliteal block   Pre-Anesthetic Checklist: ,, timeout performed, Correct Patient, Correct Site, Correct Laterality, Correct Procedure, Correct Position, site marked, Risks and benefits discussed,  Surgical consent,  Pre-op evaluation,  At surgeon's request and post-op pain management  Laterality: Right  Prep: chloraprep       Needles:  Injection technique: Single-shot  Needle Type: Echogenic Needle     Needle Length: 9cm  Needle Gauge: 21     Additional Needles:   Procedures:,,,, ultrasound used (permanent image in chart),,,,  Narrative:  Start time: 05/04/2019 12:40 PM End time: 05/04/2019 12:46 PM Injection made incrementally with aspirations every 5 mL.  Performed by: Personally  Anesthesiologist: Suzette Battiest, MD

## 2019-05-04 NOTE — Anesthesia Procedure Notes (Signed)
Procedure Name: LMA Insertion Date/Time: 05/04/2019 1:45 PM Performed by: Clearnce Sorrel, CRNA Pre-anesthesia Checklist: Patient identified, Emergency Drugs available, Suction available, Patient being monitored and Timeout performed Patient Re-evaluated:Patient Re-evaluated prior to induction Oxygen Delivery Method: Circle system utilized Preoxygenation: Pre-oxygenation with 100% oxygen Induction Type: IV induction LMA: LMA inserted LMA Size: 4.0 Number of attempts: 1 Placement Confirmation: positive ETCO2 and breath sounds checked- equal and bilateral Tube secured with: Tape Dental Injury: Teeth and Oropharynx as per pre-operative assessment

## 2019-05-04 NOTE — Op Note (Signed)
   Date of Surgery: 05/04/2019  INDICATIONS: Mr. Kendall is a 64 y.o.-year-old male who patient is a 63 year old gentleman who is over 5 months status post transmetatarsal amputation.  Patient has had progressive gangrenous dehiscence of the wound.  Patient does not have foot salvage options available and he presents at this time for transtibial amputation.[name] Kitchen  PREOPERATIVE DIAGNOSIS: Gangrenous transmetatarsal amputation right foot  POSTOPERATIVE DIAGNOSIS: Same.  PROCEDURE: Transtibial amputation Application of Prevena wound VAC  SURGEON: Sharol Given, M.D.  ANESTHESIA:  general  IV FLUIDS AND URINE: See anesthesia.  ESTIMATED BLOOD LOSS: Minimal mL.  COMPLICATIONS: None.  DESCRIPTION OF PROCEDURE: The patient was brought to the operating room and underwent a general anesthetic. After adequate levels of anesthesia were obtained patient's lower extremity was prepped using DuraPrep draped into a sterile field. A timeout was called. The foot was draped out of the sterile field with impervious stockinette. A transverse incision was made 11 cm distal to the tibial tubercle. This curved proximally and a large posterior flap was created. The tibia was transected 1 cm proximal to the skin incision. The fibula was transected just proximal to the tibial incision. The tibia was beveled anteriorly. A large posterior flap was created. The sciatic nerve was pulled cut and allowed to retract. The vascular bundles were suture ligated with 2-0 silk. The deep and superficial fascial layers were closed using #1 Vicryl. The skin was closed using staples and 2-0 nylon. The wound was covered with a Prevena wound VAC. There was a good suction fit. A prosthetic shrinker was applied. Patient was extubated taken to the PACU in stable condition.   DISCHARGE PLANNING:  Antibiotic duration: 23-hour antibiotics  Weightbearing: Nonweightbearing on the right  Pain medication: Opioid pathway  Dressing care/ Wound VAC:  Continue wound VAC for 1 week after discharge  Discharge to: Anticipate discharge to skilled nursing patient request [facility]  Follow-up: In the office 1 week post operative.  Meridee Score, MD [place] 2:30 PM

## 2019-05-05 ENCOUNTER — Other Ambulatory Visit: Payer: Self-pay | Admitting: *Deleted

## 2019-05-05 ENCOUNTER — Encounter (HOSPITAL_COMMUNITY): Payer: Self-pay | Admitting: Orthopedic Surgery

## 2019-05-05 ENCOUNTER — Ambulatory Visit: Admission: RE | Admit: 2019-05-05 | Payer: Medicare Other | Source: Ambulatory Visit

## 2019-05-05 LAB — GLUCOSE, CAPILLARY
Glucose-Capillary: 132 mg/dL — ABNORMAL HIGH (ref 70–99)
Glucose-Capillary: 168 mg/dL — ABNORMAL HIGH (ref 70–99)
Glucose-Capillary: 100 mg/dL — ABNORMAL HIGH (ref 70–99)

## 2019-05-05 NOTE — Progress Notes (Signed)
The proposed treatment discussed in cancer conference 05/05/19 is for discussion purpose only and is not a binding recommendation.  The patient was not physically examined nor present for their treatment options.  Therefore, final treatment plans cannot be decided.   Will update Dr. Isidore Moos on discussion.

## 2019-05-05 NOTE — Progress Notes (Signed)
Patient ID: [name], male   DOB: Jul 29, 1956, 63 y.o.   MRN: [id number] Postoperative day 1 right transtibial amputation.  [facility].  There is no drainage in the wound VAC canister.  Plan for stump shrinker and limb protector from [facility].

## 2019-05-05 NOTE — Progress Notes (Signed)
Orthopedic Tech Progress Note Patient Details:  [name] 1956-05-20 [id number]  [facility] called this morning asking about patients weight and would be out later today to apply stump shrinker  Patient ID: [name], male   DOB: Apr 08, 1956, 63 y.o.   MRN: [id number]   Janit Pagan 05/05/2019, 8:07 AM

## 2019-05-05 NOTE — Plan of Care (Signed)

## 2019-05-05 NOTE — Evaluation (Signed)
Physical Therapy Evaluation Patient Details Name: [name] MRN: [id number] DOB: 1956/06/02 Today's Date: 05/05/2019   History of Present Illness  63 y.o. 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 Pt now s/p R BKA.  Clinical Impression  Pt demonstrates deficits in gait, functional mobility, balance, endurance, safety awareness. Pt a limited participant in evaluation, declining to perform any OOB activity on this date 2/2 fatigue and being to soon after surgery, despite PT education on the importance of early mobilization. Pt participated in HEP, and performs bed mobility independently at this time (supine-long sitting). PT provides education on limb protector, only needed for OOB activity and not required in bed. Pt demonstrates full ROM in RLE, as well a functional ROM and strength in LLE. Pt will benefit from continued PT POC to progress upon mobility deficits and reduce falls risk.    Follow Up Recommendations SNF(pt without assistance at home)    Equipment Recommendations  None recommended by PT    Recommendations for Other Services       Precautions / Restrictions Precautions Precautions: Fall Required Braces or Orthoses: Other Brace Other Brace: limb protector for all OOB activity Restrictions Weight Bearing Restrictions: Yes RLE Weight Bearing: Non weight bearing      Mobility  Bed Mobility Overal bed mobility: Modified Independent Bed Mobility: Supine to Sit     Supine to sit: Modified independent (Device/Increase time)(supine to long sitting, 4 reps, use of bed rails intermitten)        Transfers Overall transfer level: (pt declined attempts at OOB mobility)                  Ambulation/Gait                Stairs            Wheelchair Mobility    Modified [place] (Stroke Patients Only)       Balance Overall balance assessment: Needs  assistance Sitting-balance support: Single extremity supported;Feet supported(long sitting in bed) Sitting balance-Leahy Scale: Good     Standing balance support: (pt declined attempts at standing)                                 Pertinent Vitals/Pain Pain Assessment: Faces Faces Pain Scale: Hurts a little bit Pain Location: RLE Pain Descriptors / Indicators: Aching Pain Intervention(s): Limited activity within patient's tolerance    Home Living Family/patient expects to be discharged to:: Private residence Living Arrangements: Spouse/significant other Available Help at Discharge: Family;Available PRN/intermittently(spouse caring for 53 y.o. mother) Type of Home: House Home Access: Level entry     Home Layout: One level Home Equipment: Walker - 2 wheels;Cane - single point;Crutches      Prior Function Level of Independence: Independent with assistive device(s)               Hand Dominance   Dominant Hand: Right    Extremity/Trunk Assessment   Upper Extremity Assessment Upper Extremity Assessment: Overall WFL for tasks assessed    Lower Extremity Assessment Lower Extremity Assessment: Overall WFL for tasks assessed    Cervical / Trunk Assessment Cervical / Trunk Assessment: Normal  Communication   Communication: No difficulties  Cognition Arousal/Alertness: Awake/alert Behavior During Therapy: WFL for tasks assessed/performed Overall Cognitive Status: Within Functional Limits for tasks assessed  General Comments      Exercises Amputee Exercises Quad Sets: AROM;5 reps;Both Knee Flexion: AROM;5 reps;Right Knee Extension: AROM;5 reps;Right Straight Leg Raises: AROM;5 reps;Right   Assessment/Plan    PT Assessment Patient needs continued PT services  PT Problem List Decreased activity tolerance;Decreased balance;Decreased mobility;Decreased knowledge of use of DME;Decreased safety  awareness;Decreased knowledge of precautions;Pain       PT Treatment Interventions DME instruction;Gait training;Functional mobility training;Therapeutic activities;Therapeutic exercise;Balance training;Neuromuscular re-education    PT Goals (Current goals can be found in the Care Plan section)  Acute Rehab PT Goals Patient Stated Goal: To go to rehab and get back to independent mobility PT Goal Formulation: With patient Time For Goal Achievement: 05/19/19 Potential to Achieve Goals: Good Additional Goals Additional Goal #1: Pt will perform HEP indepedently, 10 reps per exercise, 3-5 times per day.    Frequency Min 2X/week   Barriers to discharge        Co-evaluation               AM-PAC PT "6 Clicks" Mobility  Outcome Measure Help needed turning from your back to your side while in a flat bed without using bedrails?: None Help needed moving from lying on your back to sitting on the side of a flat bed without using bedrails?: None Help needed moving to and from a bed to a chair (including a wheelchair)?: A Little Help needed standing up from a chair using your arms (e.g., wheelchair or bedside chair)?: A Little Help needed to walk in hospital room?: A Lot Help needed climbing 3-5 steps with a railing? : Total 6 Click Score: 17    End of Session Equipment Utilized During Treatment: (none) Activity Tolerance: Patient tolerated treatment well Patient left: in bed;with call bell/phone within reach;with bed alarm set Nurse Communication: Mobility status PT Visit Diagnosis: Other abnormalities of gait and mobility (R26.89)    Time: 1194-1740 PT Time Calculation (min) (ACUTE ONLY): 23 min   Charges:   PT Evaluation $PT Eval Low Complexity: [place], PT, DPT Acute Rehabilitation Pager: [place] 05/05/2019, 2:36 PM

## 2019-05-05 NOTE — Care Management (Addendum)
CM consult acknowledged to assist with any HH/DME needs. Awaiting PT/OT eval for DCP recommendations and will continue to follow. Will need PT/OT notes for insurance auth if ST SNF is requested.   Midge Minium RN, BSN, NCM-BC, ACM-RN [phone]

## 2019-05-06 ENCOUNTER — Ambulatory Visit: Payer: Medicare Other

## 2019-05-06 LAB — GLUCOSE, CAPILLARY
Glucose-Capillary: 117 mg/dL — ABNORMAL HIGH (ref 70–99)
Glucose-Capillary: 116 mg/dL — ABNORMAL HIGH (ref 70–99)
Glucose-Capillary: 113 mg/dL — ABNORMAL HIGH (ref 70–99)
Glucose-Capillary: 105 mg/dL — ABNORMAL HIGH (ref 70–99)

## 2019-05-06 MED ORDER — OXYCODONE-ACETAMINOPHEN 5-325 MG PO TABS: 1.0000 | ORAL_TABLET | ORAL | 0 refills | Status: DC | PRN

## 2019-05-06 NOTE — Plan of Care (Signed)
  Problem: Coping: Goal: Level of anxiety will decrease Outcome: Progressing   Problem: Activity: Goal: Risk for activity intolerance will decrease Outcome: Progressing   

## 2019-05-06 NOTE — Progress Notes (Signed)
Rehab Admissions Coordinator Note:  Patient was screened by Cleatrice Burke for appropriateness for an Inpatient Acute Rehab Consult.  At this time, we are recommending [facility]. Noted pt without assist at home, limited participation with therapies and requires radiation therapy. Noted pt medically ready for d/c. No bed available also for this patient at CIR into mid week next week, therefore  pt most appropriate for SNF rehab.   Cleatrice Burke RN MSN 05/06/2019, 5:34 PM  I can be reached at [phone].

## 2019-05-06 NOTE — Care Management (Signed)
CM following for dispositional needs. Will need PT/OT eval to complete FL2 for SNF placement and in order to obtain insurance auth. PASRR has been obtained. CM will continue to follow.   Midge Minium RN, BSN, NCM-BC, ACM-RN [phone]

## 2019-05-06 NOTE — TOC Initial Note (Addendum)
Transition of Care [facility]) - Initial/Assessment Note    Patient Details  Name: [name] MRN: [id number] Date of Birth: February 19, 1956  [facility] CM/SW Contact:    Devin Minium RN, BSN, NCM-BC, ACM-RN [phone] Phone Number: 05/06/2019, 3:09 PM  Clinical Narrative:                 CM following for dispositional needs. CM spoke with the patient to discuss the POC. Patient states living at home with his spouse PTA and was independent with his ADLs. Patient is s/p R BKA and is requesting ST SNF prior to returning home to his spouse. CMS SNF Compare provided with [facility] SN[facility] selected; patient states that's the only facility he is agreeable to discharging to for rehab. FL2/PASRR complete, with referral sent. Patient will require insurance auth and a recent COVID test. CM team will continue to follow.  Addendum: 05/06/19 @ 1613-[name] RNCM-CM informed by Levada Dy ([facility] Admissions Coordinator) of patient being denied bed offer d/t radiotherapy needs and not being able to meet his needs at this time. CIR screening to eval for appropriateness. CM updated patient and spouse, [name]. Referral will be faxed to [facility] SNF per spouse request. CMS SNF Compare list sent to patients spouse. CM team will continue to follow.     Expected Discharge Plan: Skilled Nursing Facility Barriers to Discharge: Insurance Authorization, SNF Pending bed offer   Patient Goals and CMS Choice Patient states their goals for this hospitalization and ongoing recovery are:: "to rehab then go home" CMS Medicare.gov Compare Post Acute Care list provided to:: Patient Choice offered to / list presented to : Patient  Expected Discharge Plan and Services Expected Discharge Plan: [place] In-house Referral: Clinical Social Work Discharge Planning Services: CM Consult Post Acute Care Choice: [place] arrangements for the past 2 months: [place] Expected Discharge Date: 05/06/19               DME Arranged: N/A DME Agency: NA       HH Arranged: NA [place] Agency: NA        Prior Living Arrangements/Services Living arrangements for the past 2 months: Single Family Home Lives with:: Self, Spouse Patient language and need for interpreter reviewed:: Yes Do you feel safe going back to the place where you live?: Yes      Need for Family Participation in Patient Care: Yes (Comment) Care giver support system in place?: No (comment) Current home services: DME Criminal Activity/Legal Involvement Pertinent to Current Situation/Hospitalization: No - Comment as needed  Activities of Daily Living Home Assistive Devices/Equipment: Cane (specify quad or straight), CBG Meter ADL Screening (condition at time of admission) Patient's cognitive ability adequate to safely complete daily activities?: Yes Is the patient deaf or have difficulty hearing?: Yes Does the patient have difficulty seeing, even when wearing glasses/contacts?: No Does the patient have difficulty concentrating, remembering, or making decisions?: No Patient able to express need for assistance with ADLs?: Yes Does the patient have difficulty dressing or bathing?: No Independently performs ADLs?: Yes (appropriate for developmental age) Does the patient have difficulty walking or climbing stairs?: Yes Weakness of Legs: None Weakness of Arms/Hands: None  Permission Sought/Granted Permission sought to share information with : Case Manager, Customer service manager Permission granted to share information with : Yes, Verbal Permission Granted     Permission granted to share info w AGENCY: [facility]        Emotional Assessment  Attitude/Demeanor/Rapport: Gracious Affect (typically observed): Pleasant, Appropriate Orientation: : Oriented to Self, Oriented to Place, Oriented to  Time, Oriented to Situation Alcohol / Substance Use: Not Applicable Psych  Involvement: No (comment)  Admission diagnosis:  Gangrene Right Foot Patient Active Problem List   Diagnosis Date Noted  . Below-knee amputation of right lower extremity ([place]) 05/04/2019  . 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 Cancer of upper lobe of left lung ([place]) 02/16/2019  . Neck mass 02/03/2019  . Mass of left lung 01/26/2019  . Lung cancer ([place]) 01/24/2019  . Malignancy ([place]) 01/23/2019  . Status post transmetatarsal amputation of right foot ([place]) 11/12/2018  . Dehiscence of amputation stump ([place])   . Status post transmetatarsal amputation of foot, right ([place]) 09/22/2018  . Status post surgery 06/25/2018  . Type 2 diabetes mellitus ([place]) 06/19/2018  . Sepsis ([place]) 06/19/2018  . Back pain 06/19/2018  . Former smoker 06/19/2018  . Gangrene of right foot ([place])   . Hyperglycemia   . Dehydration 07/07/2012  . Syncope 07/07/2012  . Hypotension 07/07/2012  . Leukocytosis 07/07/2012  . Hyperkalemia 07/07/2012   PCP:  Patient, No Pcp Per Pharmacy:   [facility], [place] [address] Phone: [phone] Fax: [phone]     Social Determinants of Health (SDOH) Interventions    Readmission Risk Interventions No flowsheet data found.

## 2019-05-06 NOTE — Evaluation (Addendum)
Occupational Therapy Evaluation Patient Details Name: [name] MRN: [id number] DOB: 05-31-1956 Today's Date: 05/06/2019    History of Present Illness 63 y.o. 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 Pt now s/p R BKA.   Clinical Impression   This 63 yo male admitted and underwent above presents to acute OT with increased pain, decreased mobility, impulsivity, and decreased balance all affecting his safety and independence with basic ADLs. He will benefit from acute OT with follow up at SNF to get to a Mod I level (W/C) to go home with his wife.    Follow Up Recommendations  CIR;Supervision/Assistance - 24 hour    Equipment Recommendations  Other (comment)(TBD at next venue)       Precautions / Restrictions Precautions Precautions: Fall Required Braces or Orthoses: Other Brace Other Brace: limb protector for all OOB activity Restrictions Weight Bearing Restrictions: Yes RLE Weight Bearing: Non weight bearing      Mobility Bed Mobility Overal bed mobility: Needs Assistance Bed Mobility: Supine to Sit;Sit to Supine     Supine to sit: Supervision;HOB elevated Sit to supine: Supervision;HOB elevated   General bed mobility comments: Due to safety  Transfers Overall transfer level: Needs assistance Equipment used: Rolling walker (2 wheeled) Transfers: Sit to/from Stand Sit to Stand: +2 physical assistance;Min assist         General transfer comment: Decreased safety with sit<>stand with RW and pt moves fast thus with increased likelihood for falling    Balance Overall balance assessment: Needs assistance Sitting-balance support: No upper extremity supported Sitting balance-Leahy Scale: Good     Standing balance support: Bilateral upper extremity supported Standing balance-Leahy Scale: Poor Standing balance comment: reliant on RW                           ADL either  performed or assessed with clinical judgement   ADL Overall ADL's : Needs assistance/impaired Eating/Feeding: Independent;Sitting   Grooming: Set up;Sitting   Upper Body Bathing: Set up;Sitting   Lower Body Bathing: Minimal assistance;Sit to/from stand   Upper Body Dressing : Set up;Sitting   Lower Body Dressing: Minimal assistance;Sit to/from stand Lower Body Dressing Details (indicate cue type and reason): Pt was able to don his limb protector with only VCs for sliding residual limb a little further down into it Toilet Transfer: Minimal assistance;+2 for safety/equipment;RW Toilet Transfer Details (indicate cue type and reason): Bed>5 hops foreward then 5 back>bed Toileting- Clothing Manipulation and Hygiene: Minimal assistance;Sit to/from stand;+2 for physical assistance               Vision Patient Visual Report: No change from baseline              Pertinent Vitals/Pain Pain Assessment: 0-10 Pain Score: 10-Worst pain ever Pain Location: RLE when he started to ambulate Pain Descriptors / Indicators: Stabbing;Throbbing;Shooting;Sharp Pain Intervention(s): Monitored during session;Repositioned;RN gave pain meds during session;Patient requesting pain meds-RN notified     Hand Dominance  right   Extremity/Trunk Assessment Upper Extremity Assessment Upper Extremity Assessment: Overall WFL for tasks assessed           Communication  No issues   Cognition Arousal/Alertness: Awake/alert Behavior During Therapy: WFL for tasks assessed/performed Overall Cognitive Status: No family/caregiver present to determine baseline cognitive functioning  General Comments: pt impulsive and decreased safety awareness when up on foot and sit<>stand transfers              [place] expects to be discharged to:: Skilled nursing facility                                                 OT  Problem List: Decreased range of motion;Decreased activity tolerance;Impaired balance (sitting and/or standing);Pain      OT Treatment/Interventions: Self-care/ADL training;DME and/or AE instruction;Patient/family education;Balance training    OT Goals(Current goals can be found in the care plan section) Acute Rehab OT Goals Patient Stated Goal: to go to rehab and then home OT Goal Formulation: With patient Time For Goal Achievement: 05/20/19 Potential to Achieve Goals: Good ADL Goals Pt Will Perform Grooming: with set-up;with supervision;sitting Pt Will Perform Lower Body Bathing: with set-up;with supervision;sit to/from stand Pt Will Perform Lower Body Dressing: with set-up;with supervision;sit to/from stand Pt Will Transfer to Toilet: with supervision;ambulating;bedside commode(over toilet) Pt Will Perform Toileting - Clothing Manipulation and hygiene: with supervision;sit to/from stand  OT Frequency: Min 2X/week   Barriers to D/C: Decreased caregiver support  reports wife cannot help him at home          AM-PAC OT "6 Clicks" Daily Activity     Outcome Measure Help from another person eating meals?: None Help from another person taking care of personal grooming?: A Little Help from another person toileting, which includes using toliet, bedpan, or urinal?: A Little Help from another person bathing (including washing, rinsing, drying)?: A Little Help from another person to put on and taking off regular upper body clothing?: A Little Help from another person to put on and taking off regular lower body clothing?: A Little 6 Click Score: 19   End of Session Equipment Utilized During Treatment: Gait belt;Rolling walker(limb protector) Nurse Communication: Patient requests pain meds  Activity Tolerance: Patient limited by pain Patient left: in bed;with call bell/phone within reach;with bed alarm set  OT Visit Diagnosis: Unsteadiness on feet (R26.81);Other abnormalities of gait and  mobility (R26.89);Other symptoms and signs involving cognitive function;Pain Pain - Right/Left: Right Pain - part of body: Leg                Time: 1315-1330 OT Time Calculation (min): 15 min Charges:  OT General Charges $OT Visit: 1 Visit OT Evaluation $OT Eval Moderate Complexity: 1 Mod  [place], OTR/L Acute [employer] Pager [phone] Office [phone]     Almon Register 05/06/2019, 4:05 PM

## 2019-05-06 NOTE — Progress Notes (Signed)
Physical Therapy Treatment Patient Details Name: [name] MRN: [id number] DOB: 30-Jan-1956 Today's Date: 05/06/2019    History of Present Illness 63 y.o. 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 Pt now s/p R BKA.    PT Comments    Pt supine in bed and slightly agitated.  He performed transfers and short bout of gt training with min assistance.  He presents with poor safety and impulsivity.  Plan for SNF remains appropriate to improve strength and function before returning home to private residence,  Plan for continue strengthening next session to review HEP that was given this pm      Follow Up Recommendations  SNF(pt has no assistance at home.)     Equipment Recommendations  None recommended by PT    Recommendations for Other Services       Precautions / Restrictions Precautions Precautions: Fall Required Braces or Orthoses: Other Brace Other Brace: limb protector for all OOB activity Restrictions Weight Bearing Restrictions: Yes RLE Weight Bearing: Non weight bearing    Mobility  Bed Mobility Overal bed mobility: Needs Assistance Bed Mobility: Supine to Sit;Sit to Supine     Supine to sit: Supervision;HOB elevated Sit to supine: Supervision;HOB elevated   General bed mobility comments: Due to safety  Transfers Overall transfer level: Needs assistance Equipment used: Rolling walker (2 wheeled) Transfers: Sit to/from Stand Sit to Stand: +2 physical assistance;Min assist         General transfer comment: Decreased safety with sit<>stand with RW and pt moves fast thus with increased likelihood for falling  Ambulation/Gait Ambulation/Gait assistance: Min assist Gait Distance (Feet): 12 Feet(58ft forward and 89ft backward) Assistive device: Rolling walker (2 wheeled) Gait Pattern/deviations: Step-to pattern(hop to pattern)         Stairs             Wheelchair Mobility     Modified Rankin (Stroke Patients Only)       Balance Overall balance assessment: Needs assistance Sitting-balance support: No upper extremity supported Sitting balance-Leahy Scale: Good     Standing balance support: Bilateral upper extremity supported Standing balance-Leahy Scale: Poor Standing balance comment: reliant on RW                            Cognition Arousal/Alertness: Awake/alert Behavior During Therapy: WFL for tasks assessed/performed Overall Cognitive Status: No family/caregiver present to determine baseline cognitive functioning                                 General Comments: pt impulsive and decreased safety awareness when up on foot and sit<>stand transfers      Exercises      General Comments        Pertinent Vitals/Pain Pain Assessment: 0-10 Pain Score: 10-Worst pain ever Pain Location: RLE when he started to ambulate Pain Descriptors / Indicators: Stabbing;Throbbing;Shooting;Sharp Pain Intervention(s): Monitored during session;Repositioned;RN gave pain meds during session;Patient requesting pain meds-RN notified    Home Living Family/patient expects to be discharged to:: Skilled nursing facility                    Prior Function            PT Goals (current goals can now be found in the care plan section) Acute Rehab PT Goals Patient Stated Goal: to  go to rehab and then home PT Goal Formulation: With patient Potential to Achieve Goals: Good Additional Goals Additional Goal #1: Pt will perform HEP indepedently, 10 reps per exercise, 3-5 times per day. Progress towards PT goals: Progressing toward goals    Frequency    Min 2X/week      PT Plan Current plan remains appropriate    Co-evaluation PT/OT/SLP Co-Evaluation/Treatment: Yes Reason for Co-Treatment: Complexity of the patient's impairments (multi-system involvement) PT goals addressed during session: Mobility/safety with mobility OT  goals addressed during session: ADL's and self-care      AM-PAC PT "6 Clicks" Mobility   Outcome Measure  Help needed turning from your back to your side while in a flat bed without using bedrails?: None Help needed moving from lying on your back to sitting on the side of a flat bed without using bedrails?: None Help needed moving to and from a bed to a chair (including a wheelchair)?: A Little Help needed standing up from a chair using your arms (e.g., wheelchair or bedside chair)?: A Little Help needed to walk in hospital room?: A Little Help needed climbing 3-5 steps with a railing? : A Lot 6 Click Score: 19    End of Session Equipment Utilized During Treatment: Gait belt Activity Tolerance: Patient tolerated treatment well Patient left: in bed;with call bell/phone within reach;with bed alarm set Nurse Communication: Mobility status PT Visit Diagnosis: Other abnormalities of gait and mobility (R26.89)     Time: 7948-0165 PT Time Calculation (min) (ACUTE ONLY): 36 min  Charges:  $Therapeutic Activity: 8-22 mins                     Governor Rooks, PTA Acute Rehabilitation Services Pager [phone] Office [phone]     Aryannah Mohon Eli Hose 05/06/2019, 2:21 PM

## 2019-05-06 NOTE — Discharge Summary (Signed)
Discharge Diagnoses:  Active Problems:   Gangrene of right foot ([place])   Below-knee amputation of right lower extremity ([place])   Surgeries: Procedure(s): RIGHT BELOW KNEE AMPUTATION on 05/04/2019    Consultants:   Discharged Condition: Improved  Hospital Course: [name] is an 63 y.o. male who was admitted 05/04/2019 with a chief complaint of non healing ulceration/gangrene of right foot, with a final diagnosis of Gangrene Right Foot.  Patient was brought to the operating room on 05/04/2019 and underwent Procedure(s): RIGHT BELOW KNEE AMPUTATION.    Patient was given perioperative antibiotics:  Anti-infectives (From admission, onward)   Start     Dose/Rate Route Frequency Ordered Stop   05/04/19 1630  ceFAZolin (ANCEF) IVPB 1 g/50 mL premix     1 g 100 mL/hr over 30 Minutes Intravenous Every 6 hours 05/04/19 1628 05/05/19 0554   05/04/19 0915  ceFAZolin (ANCEF) IVPB 2g/100 mL premix     2 g 200 mL/hr over 30 Minutes Intravenous To Short Stay 05/04/19 0910 05/04/19 1352    .  Patient was given sequential compression devices, early ambulation, and aspirin for DVT prophylaxis.  Recent vital signs:  Patient Vitals for the past 24 hrs:  BP Temp Temp src Pulse Resp SpO2  05/06/19 0813 135/86 98.1 F (36.7 C) Oral 77 18 95 %  05/06/19 0334 (!) 128/93 (!) 97.5 F (36.4 C) Oral 86 16 98 %  05/05/19 2055 124/90 99.1 F (37.3 C) Oral 81 14 97 %  05/05/19 1554 119/77 98.3 F (36.8 C) Oral 74 17 97 %  .  Recent laboratory studies: No results found.  Discharge Medications:   Allergies as of 05/06/2019      Reactions   Adhesive [tape] Other (See Comments)   CAUSES BLISTERS- PLEASE USE PAPER TAPE!!   Bactrim Ds [sulfamethoxazole-trimethoprim] Nausea And Vomiting   Trental [pentoxifylline] Nausea And Vomiting      Medication List    TAKE these medications   aspirin 81 MG EC tablet Take 1 tablet (81 mg total) by mouth daily.   clopidogrel 75 MG tablet Commonly known as:  PLAVIX Take 1 tablet (75 mg total) by mouth daily with breakfast.   gabapentin 100 MG capsule Commonly known as: NEURONTIN Take 1-2 capsules (100-200 mg total) by mouth See admin instructions. Take 100 mg by mouth in the morning and 200 mg at bedtime   gabapentin 300 MG capsule Commonly known as: NEURONTIN Take 1 capsule (300 mg total) by mouth 3 (three) times daily. 3 times a day when necessary neuropathy pain   glipiZIDE 5 MG tablet Commonly known as: GLUCOTROL Take 2 tablets (10 mg total) by mouth daily before breakfast.   LORazepam 0.5 MG tablet Commonly known as: ATIVAN Take 1-2 tablets PO, PRN anxiety, 30 minutes before radiotherapy.   oxyCODONE-acetaminophen 5-325 MG tablet Commonly known as: Percocet Take 1 tablet by mouth every 4 (four) hours as needed for moderate pain or severe pain.   rosuvastatin 10 MG tablet Commonly known as: CRESTOR Take 1 tablet (10 mg total) by mouth daily at 6 PM.       Diagnostic Studies: Dg Chest 2 View  Result Date: 04/19/2019 CLINICAL DATA:  63 year old male with preop video bronchoscopy. Ongoing cough. Left upper lobe nodule. EXAM: CHEST - 2 VIEW COMPARISON:  Chest CT dated 01/23/2019 and PET dated 02/01/2019 FINDINGS: Left upper lobe nodular density measuring approximately 2 cm corresponding to the hypermetabolic nodule seen on the prior PET-CT. The lungs are otherwise clear. No  pleural effusion or pneumothorax. The cardiac silhouette is within normal limits. Atherosclerotic calcification of the aorta. No acute osseous pathology. IMPRESSION: Left upper lobe nodule. No acute cardiopulmonary process. Electronically Signed   By: Anner Crete M.D.   On: 04/19/2019 16:25   Dg C-arm Bronchoscopy  Result Date: 04/21/2019 C-ARM BRONCHOSCOPY: Fluoroscopy was utilized by the requesting physician.  No radiographic interpretation.    Patient benefited maximally from their hospital stay and there were no complications.     Disposition:  Discharge disposition: 03-Skilled Nursing Facility      Discharge Instructions    Call MD / Call 911   Complete by: As directed    If you experience chest pain or shortness of breath, CALL 911 and be transported to the hospital emergency room.  If you develope a fever above 101 F, pus (white drainage) or increased drainage or redness at the wound, or calf pain, call your surgeon's office.   Care order/instruction:   Complete by: As directed    Place Prevena VAC at DC and instruct patient and SNF facility to keep plugged into wall outlet as much as possible to keep machine charged.   Constipation Prevention   Complete by: As directed    Drink plenty of fluids.  Prune juice may be helpful.  You may use a stool softener, such as Colace (over the counter) 100 mg twice a day.  Use MiraLax (over the counter) for constipation as needed.   Diet - low sodium heart healthy   Complete by: As directed    Increase activity slowly as tolerated   Complete by: As directed      Follow-up Information    [name], MD In 1 week.   Specialty: Orthopedic Surgery Contact information: [place] [address] [phone]            Signed: Ulysees Barns 05/06/2019, 8:27 AM  [facility] [phone]

## 2019-05-06 NOTE — Discharge Instructions (Signed)
Keep Prevena VAC machine plugged into wall outlet as much as possible to keep machine charged.   Follow up in Dr. Jess Barters office in 1 week.

## 2019-05-06 NOTE — NC FL2 (Signed)
[place] MEDICAID FL2 LEVEL OF CARE SCREENING TOOL     IDENTIFICATION  Patient Name: [name] Birthdate: January 17, 1956 Sex: male Admission Date (Current Location): 05/04/2019  [facility] and [place] Number:  Herbalist and Address:  [facility] [place] [address] [place], [place] [address]      Provider Number: [id number]  Attending Physician Name and Address:  [name], MD  Relative Name and Phone Number:  [name] [phone]    Current Level of Care: Hospital Recommended Level of Care: [place] Prior Approval Number:    Date Approved/Denied:   PASRR Number: [id number]  Discharge Plan: SNF    Current Diagnoses: Patient Active Problem List   Diagnosis Date Noted  . Below-knee amputation of right lower extremity ([place]) 05/04/2019  . 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 Cancer of upper lobe of left lung ([place]) 02/16/2019  . Neck mass 02/03/2019  . Mass of left lung 01/26/2019  . Lung cancer ([place]) 01/24/2019  . Malignancy ([place]) 01/23/2019  . Status post transmetatarsal amputation of right foot ([place]) 11/12/2018  . Dehiscence of amputation stump ([place])   . Status post transmetatarsal amputation of foot, right ([place]) 09/22/2018  . Status post surgery 06/25/2018  . Type 2 diabetes mellitus ([place]) 06/19/2018  . Sepsis ([place]) 06/19/2018  . Back pain 06/19/2018  . Former smoker 06/19/2018  . Gangrene of right foot ([place])   . Hyperglycemia   . Dehydration 07/07/2012  . Syncope 07/07/2012  . Hypotension 07/07/2012  . Leukocytosis 07/07/2012  . Hyperkalemia 07/07/2012    Orientation RESPIRATION BLADDER Height & Weight     Self, Time, Situation, Place  Normal Continent Weight: 72.1 kg Height:  5' 8.5" (174 cm)  BEHAVIORAL SYMPTOMS/MOOD NEUROLOGICAL BOWEL NUTRITION STATUS  Other (Comment)(N/A) (N/A) Continent Diet(Carb Modified)   AMBULATORY STATUS COMMUNICATION OF NEEDS Skin   Limited Assist Verbally Surgical wounds(Right Foot; wound vac intact)                       Personal Care Assistance Level of Assistance  Bathing, Dressing, Feeding Bathing Assistance: Limited assistance Feeding assistance: Limited assistance Dressing Assistance: Limited assistance     Functional Limitations Info  Sight, Hearing, Speech Sight Info: Adequate Hearing Info: Adequate Speech Info: Adequate    SPECIAL CARE FACTORS FREQUENCY  PT (By licensed PT), OT (By licensed OT)     PT Frequency: 3x/week OT Frequency: 3x/week            Contractures Contractures Info: Not present    Additional Factors Info  Code Status, Allergies, Insulin Sliding Scale Code Status Info: Full Code Allergies Info: Adhesive Tape, Bactrim, Trental   Insulin Sliding Scale Info: 3 times daily with meals       Current Medications (05/06/2019):  This is the current hospital active medication list Current Facility-Administered Medications  Medication Dose Route Frequency Provider Last Rate Last Dose  . 0.9 %  sodium chloride infusion   Intravenous Continuous [name], MD 10 mL/hr at 05/04/19 1638    . acetaminophen (TYLENOL) tablet 325-650 mg  325-650 mg Oral Q6H PRN [name], MD      . aspirin EC tablet 81 mg  81 mg Oral Daily [name], MD   81 mg at 05/06/19 0908  . bisacodyl (DULCOLAX) suppository 10 mg  10 mg Rectal Daily PRN Meridee Score  V, MD      . clopidogrel (PLAVIX) tablet 75 mg  75 mg Oral Q breakfast [name], MD   75 mg at 05/06/19 0900  . docusate sodium (COLACE) capsule 100 mg  100 mg Oral BID [name], MD   100 mg at 05/06/19 0908  . gabapentin (NEURONTIN) capsule 300 mg  300 mg Oral TID [name], MD   300 mg at 05/06/19 0907  . glipiZIDE (GLUCOTROL) tablet 10 mg  10 mg Oral QAC breakfast [name], MD   10 mg at 05/06/19 0900  . HYDROmorphone (DILAUDID) injection 0.5-1 mg  0.5-1 mg  Intravenous Q4H PRN [name], MD   1 mg at 05/06/19 1330  . insulin aspart (novoLOG) injection 0-15 Units  0-15 Units Subcutaneous TID WC [name], MD   3 Units at 05/05/19 1255  . LORazepam (ATIVAN) tablet 0.5 mg  0.5 mg Oral Q6H PRN [name], MD   0.5 mg at 05/05/19 2102  . magnesium citrate solution 1 Bottle  1 Bottle Oral Once PRN [name], MD      . methocarbamol (ROBAXIN) tablet 500 mg  500 mg Oral Q6H PRN [name], MD   500 mg at 05/06/19 0908   Or  . methocarbamol (ROBAXIN) 500 mg in dextrose 5 % 50 mL IVPB  500 mg Intravenous Q6H PRN [name], MD      . metoCLOPramide (REGLAN) tablet 5-10 mg  5-10 mg Oral Q8H PRN [name], MD       Or  . metoCLOPramide (REGLAN) injection 5-10 mg  5-10 mg Intravenous Q8H PRN [name], MD      . ondansetron [facility]) tablet 4 mg  4 mg Oral Q6H PRN [name], MD       Or  . ondansetron [facility]) injection 4 mg  4 mg Intravenous Q6H PRN [name], MD      . oxyCODONE (Oxy IR/ROXICODONE) immediate release tablet 10-15 mg  10-15 mg Oral Q4H PRN [name], MD   15 mg at 05/06/19 1207  . oxyCODONE (Oxy IR/ROXICODONE) immediate release tablet 5-10 mg  5-10 mg Oral Q4H PRN [name], MD   10 mg at 05/05/19 1657  . polyethylene glycol (MIRALAX / GLYCOLAX) packet 17 g  17 g Oral Daily PRN [name], MD         Discharge Medications: Please see discharge summary for a list of discharge medications.  Relevant Imaging Results:  Relevant Lab Results:   Additional Information SSN: [id number]; Prevena wound VAC intact  Midge Minium RN, BSN, NCM-BC, ACM-RN [phone]

## 2019-05-06 NOTE — Progress Notes (Signed)
Subjective: 2 Days Post-Op Procedure(s) (LRB): RIGHT BELOW KNEE AMPUTATION (Right) Patient reports pain as mild and moderate.   Reports he is due to start radiation therapy to the right parotid area next week following his recent resection for SCCA of the parotid.  Also recent dx of non small cell lung cancer LUL.  Objective: Vital signs in last 24 hours: Temp:  [97.5 F (36.4 C)-99.1 F (37.3 C)] 97.5 F (36.4 C) (10/02 0334) Pulse Rate:  [74-86] 86 (10/02 0334) Resp:  [14-17] 16 (10/02 0334) BP: (119-128)/(77-93) 128/93 (10/02 0334) SpO2:  [97 %-98 %] 98 % (10/02 0334)  Intake/Output from previous day: 10/01 0701 - 10/02 0700 In: 720 [P.O.:720] Out: 1800 [Urine:1800] Intake/Output this shift: No intake/output data recorded.  Recent Labs    05/04/19 1001  HGB 11.4*   Recent Labs    05/04/19 1001  WBC 14.2*  RBC 3.67*  HCT 34.5*  PLT 370   Recent Labs    05/04/19 1001  NA 140  K 4.1  CL 103  CO2 27  BUN 8  CREATININE 0.74  GLUCOSE 176*  CALCIUM 8.6*   No results for input(s): LABPT, INR in the last 72 hours.  Right transtibial amputation site with VAC dressing in place and functioning well. No drainage in VAC canister.    Assessment/Plan: 2 Days Post-Op Procedure(s) (LRB): RIGHT BELOW KNEE AMPUTATION (Right) S/P Right BKA- POD#2- continue Prevena VAC at DC Ready for DC to SNF-rehab. Percocet for pain and prescription placed on paper chart.  Follow up in the office in 1 week.    Erlinda Hong, PA-C 05/06/2019, 8:12 AM  [facility] [phone]

## 2019-05-06 NOTE — Progress Notes (Signed)
PT Cancellation Note  Patient Details Name: [name] MRN: [id number] DOB: August 04, 1956   Cancelled Treatment:    Reason Eval/Treat Not Completed: (P) Other (comment);Patient declined, no reason specified(Pt reports, "Why yall come in here so damn early?  If I can't get any rest how am I suppose to heal?"  PTA educated on the benefits and importance of mobility, he asked if therapist could return later this pm.  Will f/u per POC as time permits.)   Cristela Blue 05/06/2019, 8:46 AM Governor Rooks, PTA Acute Rehabilitation Services Pager [phone] Office [phone]

## 2019-05-07 LAB — GLUCOSE, CAPILLARY
Glucose-Capillary: 107 mg/dL — ABNORMAL HIGH (ref 70–99)
Glucose-Capillary: 95 mg/dL (ref 70–99)
Glucose-Capillary: 99 mg/dL (ref 70–99)
Glucose-Capillary: 124 mg/dL — ABNORMAL HIGH (ref 70–99)
Glucose-Capillary: 126 mg/dL — ABNORMAL HIGH (ref 70–99)
Glucose-Capillary: 122 mg/dL — ABNORMAL HIGH (ref 70–99)

## 2019-05-07 NOTE — TOC Progression Note (Signed)
Transition of Care [facility]) - Progression Note    Patient Details  Name: ZYAN COBY MRN: [id number] Date of Birth: April 06, 1956  Transition of Care [facility]) CM/SW [place], [place] Phone Number: 05/07/2019, 11:40 AM  Clinical Narrative:    CSW f/u with SNF consult. At this time [facility] has not responded in the hub; have sent therapy notes through. [facility] does not have beds available and are recommending SNF placement at this time.   Continue to follow, of note- pt insurance company is closed on the weekend.    Expected Discharge Plan: [place] Barriers to Discharge: Ship broker, SNF Pending bed offer  Expected Discharge Plan and Services Expected Discharge Plan: [place] In-house Referral: Clinical Social Work Discharge Planning Services: CM Consult Post Acute Care Choice: [place] arrangements for the past 2 months: Single Family Home Expected Discharge Date: 05/06/19               DME Arranged: N/A DME Agency: NA HH Arranged: NA HH Agency: NA   Social Determinants of Health (SDOH) Interventions    Readmission Risk Interventions No flowsheet data found.

## 2019-05-07 NOTE — Progress Notes (Signed)
Patient ID: [name], male   DOB: February 22, 1956, 63 y.o.   MRN: [id number] Patient without complaints this morning.  Plan for discharge to skilled nursing early this next week.  No drainage in the wound VAC canister.

## 2019-05-08 LAB — GLUCOSE, CAPILLARY
Glucose-Capillary: 112 mg/dL — ABNORMAL HIGH (ref 70–99)
Glucose-Capillary: 113 mg/dL — ABNORMAL HIGH (ref 70–99)
Glucose-Capillary: 158 mg/dL — ABNORMAL HIGH (ref 70–99)
Glucose-Capillary: 114 mg/dL — ABNORMAL HIGH (ref 70–99)
Glucose-Capillary: 182 mg/dL — ABNORMAL HIGH (ref 70–99)

## 2019-05-08 NOTE — Progress Notes (Signed)
Patient ID: [name], male   DOB: 13-Jul-1956, 63 y.o.   MRN: [id number] Patient states he occasionally has some shooting neuropathic pain.  No drainage in the wound VAC canister.  [name] discharge to skilled nursing Monday or Tuesday.

## 2019-05-08 NOTE — Plan of Care (Signed)
  Problem: Pain Managment: Goal: General experience of comfort will improve Outcome: Progressing   Problem: Safety: Goal: Ability to remain free from injury will improve Outcome: Progressing   Problem: Skin Integrity: Goal: Risk for impaired skin integrity will decrease Outcome: Progressing   

## 2019-05-08 NOTE — Care Management Important Message (Signed)
Important Message  Patient Details  Name: USTIN CRUICKSHANK MRN: [id number] Date of Birth: 04-03-1956   Medicare Important Message Given:  Yes     Erenest Rasher, RN 05/08/2019, 10:18 AM

## 2019-05-09 ENCOUNTER — Telehealth: Payer: Self-pay | Admitting: *Deleted

## 2019-05-09 ENCOUNTER — Ambulatory Visit: Payer: Medicare Other

## 2019-05-09 LAB — GLUCOSE, CAPILLARY
Glucose-Capillary: 122 mg/dL — ABNORMAL HIGH (ref 70–99)
Glucose-Capillary: 129 mg/dL — ABNORMAL HIGH (ref 70–99)
Glucose-Capillary: 123 mg/dL — ABNORMAL HIGH (ref 70–99)
Glucose-Capillary: 127 mg/dL — ABNORMAL HIGH (ref 70–99)

## 2019-05-09 LAB — SURGICAL PATHOLOGY

## 2019-05-09 NOTE — Telephone Encounter (Signed)
Oncology Nurse Navigator Documentation  In follow-up to this morning's call from pt wife re transportation to/from [facility] [place], LVMM confirming [place] able to coordinate, will do so upon his DC from [facility] today.  [name], RN, BSN Head & Neck Oncology Nurse [place] at [place] [phone]

## 2019-05-09 NOTE — Progress Notes (Signed)
Patient ID: [name], male   DOB: 09-04-1955, 63 y.o.   MRN: [id number] Patient states he still has occasional pain.  No drainage in the wound VAC canister.  Plan for discharge to skilled nursing.

## 2019-05-09 NOTE — Progress Notes (Signed)
Physical Therapy Treatment Patient Details Name: [name] MRN: [id number] DOB: 06/05/1956 Today's Date: 05/09/2019    History of Present Illness 63 y.o. 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 Pt now s/p R BKA.    PT Comments    Pt tolerance for OOB activity limited by pain and fatigue. Pt is very self-limiting, requiring max encouragement to participate in OOB mobility during session. Pt requesting bucket for bedside commode although ambulating sufficient distances to utilize regular commode in bathroom, without need for physical assistance. Pt will benefit from continued acute PT services to improve activity tolerance and reduce falls risk.   Follow Up Recommendations  SNF     Equipment Recommendations  None recommended by PT    Recommendations for Other Services       Precautions / Restrictions Precautions Precautions: Fall Required Braces or Orthoses: Other Brace Other Brace: limb protector for all OOB activity Restrictions Weight Bearing Restrictions: Yes RLE Weight Bearing: Non weight bearing    Mobility  Bed Mobility Overal bed mobility: Modified Independent Bed Mobility: Supine to Sit     Supine to sit: Modified independent (Device/Increase time)        Transfers Overall transfer level: Needs assistance Equipment used: Rolling walker (2 wheeled) Transfers: Sit to/from Stand Sit to Stand: Supervision            Ambulation/Gait Ambulation/Gait assistance: Supervision Gait Distance (Feet): 20 Feet Assistive device: Rolling walker (2 wheeled) Gait Pattern/deviations: (hop to pattern with reduced step length) Gait velocity: decreased Gait velocity interpretation: <1.8 ft/sec, indicate of risk for recurrent falls     Stairs             Wheelchair Mobility    Modified Rankin (Stroke Patients Only)       Balance Overall balance assessment: Needs  assistance Sitting-balance support: No upper extremity supported;Feet supported Sitting balance-Leahy Scale: Good     Standing balance support: Bilateral upper extremity supported Standing balance-Leahy Scale: Fair                              Cognition Arousal/Alertness: Awake/alert Behavior During Therapy: WFL for tasks assessed/performed Overall Cognitive Status: No family/caregiver present to determine baseline cognitive functioning                                 General Comments: pt impulsive, requires encouragement to participate in PT      Exercises      General Comments        Pertinent Vitals/Pain Pain Assessment: Faces Faces Pain Scale: Hurts little more Pain Location: RLE when he started to ambulate Pain Descriptors / Indicators: Stabbing;Throbbing;Shooting;Sharp Pain Intervention(s): Limited activity within patient's tolerance    Home Living                      Prior Function            PT Goals (current goals can now be found in the care plan section) Acute Rehab PT Goals Patient Stated Goal: to go to rehab and then home Progress towards PT goals: Progressing toward goals    Frequency    Min 2X/week      PT Plan Current plan remains appropriate    Co-evaluation  AM-PAC PT "6 Clicks" Mobility   Outcome Measure  Help needed turning from your back to your side while in a flat bed without using bedrails?: None Help needed moving from lying on your back to sitting on the side of a flat bed without using bedrails?: None Help needed moving to and from a bed to a chair (including a wheelchair)?: None Help needed standing up from a chair using your arms (e.g., wheelchair or bedside chair)?: None Help needed to walk in hospital room?: None Help needed climbing 3-5 steps with a railing? : A Lot 6 Click Score: 22    End of Session Equipment Utilized During Treatment: Gait belt Activity  Tolerance: Patient tolerated treatment well Patient left: in bed;with call bell/phone within reach Nurse Communication: Mobility status PT Visit Diagnosis: Other abnormalities of gait and mobility (R26.89)     Time: 8242-3536 PT Time Calculation (min) (ACUTE ONLY): 24 min  Charges:  $Gait Training: 8-22 mins $Therapeutic Activity: 8-22 mins                     Zenaida Niece, PT, DPT Acute Rehabilitation Pager: [phone]    Zenaida Niece 05/09/2019, 3:30 PM

## 2019-05-09 NOTE — Discharge Summary (Signed)
Discharge Diagnoses:  Active Problems:   Gangrene of right foot ([place])   Below-knee amputation of right lower extremity ([place])   Surgeries: Procedure(s): RIGHT BELOW KNEE AMPUTATION on 05/04/2019    Consultants:   Discharged Condition: Improved  Hospital Course: [name] is an 63 y.o. male who was admitted 05/04/2019 with a chief complaint of gangrene right foot, with a final diagnosis of Gangrene Right Foot.  Patient was brought to the operating room on 05/04/2019 and underwent Procedure(s): RIGHT BELOW KNEE AMPUTATION.    Patient was given perioperative antibiotics:  Anti-infectives (From admission, onward)   Start     Dose/Rate Route Frequency Ordered Stop   05/04/19 1630  ceFAZolin (ANCEF) IVPB 1 g/50 mL premix     1 g 100 mL/hr over 30 Minutes Intravenous Every 6 hours 05/04/19 1628 05/05/19 0554   05/04/19 0915  ceFAZolin (ANCEF) IVPB 2g/100 mL premix     2 g 200 mL/hr over 30 Minutes Intravenous To Short Stay 05/04/19 0910 05/04/19 1352    .  Patient was given sequential compression devices, early ambulation, and aspirin for DVT prophylaxis.  Recent vital signs:  Patient Vitals for the past 24 hrs:  BP Temp Temp src Pulse Resp SpO2  05/09/19 0323 117/85 98 F (36.7 C) Oral 74 - 96 %  05/08/19 1942 136/84 97.6 F (36.4 C) Oral 79 18 99 %  05/08/19 1628 (!) 129/94 97.6 F (36.4 C) Oral 80 16 100 %  05/08/19 0800 119/84 97.8 F (36.6 C) Oral 77 16 96 %  .  Recent laboratory studies: No results found.  Discharge Medications:   Allergies as of 05/09/2019      Reactions   Adhesive [tape] Other (See Comments)   CAUSES BLISTERS- PLEASE USE PAPER TAPE!!   Bactrim Ds [sulfamethoxazole-trimethoprim] Nausea And Vomiting   Trental [pentoxifylline] Nausea And Vomiting      Medication List    TAKE these medications   aspirin 81 MG EC tablet Take 1 tablet (81 mg total) by mouth daily.   clopidogrel 75 MG tablet Commonly known as: PLAVIX Take 1 tablet (75 mg total)  by mouth daily with breakfast.   gabapentin 100 MG capsule Commonly known as: NEURONTIN Take 1-2 capsules (100-200 mg total) by mouth See admin instructions. Take 100 mg by mouth in the morning and 200 mg at bedtime   gabapentin 300 MG capsule Commonly known as: NEURONTIN Take 1 capsule (300 mg total) by mouth 3 (three) times daily. 3 times a day when necessary neuropathy pain   glipiZIDE 5 MG tablet Commonly known as: GLUCOTROL Take 2 tablets (10 mg total) by mouth daily before breakfast.   LORazepam 0.5 MG tablet Commonly known as: ATIVAN Take 1-2 tablets PO, PRN anxiety, 30 minutes before radiotherapy.   oxyCODONE-acetaminophen 5-325 MG tablet Commonly known as: Percocet Take 1 tablet by mouth every 4 (four) hours as needed for moderate pain or severe pain.   rosuvastatin 10 MG tablet Commonly known as: CRESTOR Take 1 tablet (10 mg total) by mouth daily at 6 PM.       Diagnostic Studies: Dg Chest 2 View  Result Date: 04/19/2019 CLINICAL DATA:  63 year old male with preop video bronchoscopy. Ongoing cough. Left upper lobe nodule. EXAM: CHEST - 2 VIEW COMPARISON:  Chest CT dated 01/23/2019 and PET dated 02/01/2019 FINDINGS: Left upper lobe nodular density measuring approximately 2 cm corresponding to the hypermetabolic nodule seen on the prior PET-CT. The lungs are otherwise clear. No pleural effusion or pneumothorax.  The cardiac silhouette is within normal limits. Atherosclerotic calcification of the aorta. No acute osseous pathology. IMPRESSION: Left upper lobe nodule. No acute cardiopulmonary process. Electronically Signed   By: Anner Crete M.D.   On: 04/19/2019 16:25   Dg C-arm Bronchoscopy  Result Date: 04/21/2019 C-ARM BRONCHOSCOPY: Fluoroscopy was utilized by the requesting physician.  No radiographic interpretation.    Patient benefited maximally from their hospital stay and there were no complications.     Disposition: Discharge disposition: 03-Skilled Nursing  Facility      Discharge Instructions    Call MD / Call 911   Complete by: As directed    If you experience chest pain or shortness of breath, CALL 911 and be transported to the hospital emergency room.  If you develope a fever above 101 F, pus (white drainage) or increased drainage or redness at the wound, or calf pain, call your surgeon's office.   Call MD / Call 911   Complete by: As directed    If you experience chest pain or shortness of breath, CALL 911 and be transported to the hospital emergency room.  If you develope a fever above 101 F, pus (white drainage) or increased drainage or redness at the wound, or calf pain, call your surgeon's office.   Care order/instruction:   Complete by: As directed    Place Prevena VAC at DC and instruct patient and SNF facility to keep plugged into wall outlet as much as possible to keep machine charged.   Constipation Prevention   Complete by: As directed    Drink plenty of fluids.  Prune juice may be helpful.  You may use a stool softener, such as Colace (over the counter) 100 mg twice a day.  Use MiraLax (over the counter) for constipation as needed.   Constipation Prevention   Complete by: As directed    Drink plenty of fluids.  Prune juice may be helpful.  You may use a stool softener, such as Colace (over the counter) 100 mg twice a day.  Use MiraLax (over the counter) for constipation as needed.   Diet - low sodium heart healthy   Complete by: As directed    Diet - low sodium heart healthy   Complete by: As directed    Increase activity slowly as tolerated   Complete by: As directed    Increase activity slowly as tolerated   Complete by: As directed      Follow-up Information    [name], MD In 1 week.   Specialty: Orthopedic Surgery Contact information: [address] [place] [address] [phone]            Signed: [name] 05/09/2019, 6:47 AM

## 2019-05-10 ENCOUNTER — Telehealth: Payer: Self-pay | Admitting: *Deleted

## 2019-05-10 ENCOUNTER — Encounter: Payer: Self-pay | Admitting: *Deleted

## 2019-05-10 ENCOUNTER — Ambulatory Visit
Admission: RE | Admit: 2019-05-10 | Discharge: 2019-05-10 | Disposition: A | Discharge status: Home / Self Care | Payer: Medicare Other | Source: Ambulatory Visit | Attending: Radiation Oncology | Admitting: Radiation Oncology

## 2019-05-10 ENCOUNTER — Telehealth: Payer: Self-pay

## 2019-05-10 DIAGNOSIS — C3412 Malignant neoplasm of upper lobe, left bronchus or lung: Secondary | ICD-10-CM | POA: Insufficient documentation

## 2019-05-10 DIAGNOSIS — C07 Malignant neoplasm of parotid gland: Secondary | ICD-10-CM | POA: Insufficient documentation

## 2019-05-10 DIAGNOSIS — Z510 Encounter for antineoplastic radiation therapy: Secondary | ICD-10-CM | POA: Insufficient documentation

## 2019-05-10 LAB — NOVEL CORONAVIRUS, NAA (HOSP ORDER, SEND-OUT TO REF LAB; TAT 18-24 HRS): SARS-CoV-2, NAA: NOT DETECTED

## 2019-05-10 LAB — GLUCOSE, CAPILLARY
Glucose-Capillary: 110 mg/dL — ABNORMAL HIGH (ref 70–99)
Glucose-Capillary: 93 mg/dL (ref 70–99)
Glucose-Capillary: 159 mg/dL — ABNORMAL HIGH (ref 70–99)
Glucose-Capillary: 117 mg/dL — ABNORMAL HIGH (ref 70–99)
Glucose-Capillary: 116 mg/dL — ABNORMAL HIGH (ref 70–99)

## 2019-05-10 MED ORDER — LORAZEPAM 0.5 MG PO TABS
0.5000 mg | ORAL_TABLET | Freq: Once | ORAL | Status: AC
  Administered 2019-05-10: 0.5 mg via ORAL
  Filled 2019-05-10: qty 1

## 2019-05-10 NOTE — Progress Notes (Signed)
Patient ID: [name], male   DOB: May 12, 1956, 63 y.o.   MRN: [id number] Plan for discharge to skilled nursing today.  No change in patient's status from discharge summary dictated yesterday.  Prescriptions for Percocet are on the chart.  Discharge with the portable Praveena wound VAC pump.  No drainage of the wound VAC canister.

## 2019-05-10 NOTE — Care Management (Addendum)
Awaiting Angela ([facility] Admissions Coordinator) to confirm acceptance once patients Oncology transportation has been determined, and the facility has been assured adequate COVID testing is provided by the transportation service. CM updated patients spouse, Joycelyn Schmid, and will continue to follow. COVID test is still pending.  Addendum: 05/10/19 @ 1211-Rozina Pointer RNCM-Call received from [place] ([employer]); the facility will accept patient and coordinate transportation to [facility] for the patients treatments. Tentative transition to [facility] SNF on 05/11/19 pending insurance auth. PTAR will be arranged.   Midge Minium RN, BSN, NCM-BC, ACM-RN [phone]

## 2019-05-10 NOTE — Telephone Encounter (Signed)
I spoke with Mr. [place] nurse Louie Casa today. He is aware that Mr. Pietsch will need to be transferred by [facility] tomorrow at [facility] for his radiation treatment. He plans to pass this information onto the next shift, and [facility] will be called by dayshift tomorrow morning. I have also called [facility] to inform them of the impending transfer of Mr. Martindale in the morning.

## 2019-05-10 NOTE — Progress Notes (Signed)
Oncology Nurse Navigator Documentation  To provide support, encouragement and care continuity, met with [name] during his [facility] RT. He managed to complete tmt s/p with some difficulty but voiced understanding tomorrow's tmt will take less time because set-up approval not needed. He voiced understanding tomorrow's tmt will be in the morning to facilitate return to [facility] and DC to [facility]. RN [name] to coordinate [facility] for morning tmt.  [name], RN, BSN Head & Neck Oncology Nurse [place] at [place] [phone]

## 2019-05-10 NOTE — Telephone Encounter (Signed)
Oncology Nurse Navigator Documentation  Spoke with bedside RN Louie Casa, confirmed [place] for Mr. Sandy to arrive via [place] when able.  Explained will likely need [facility] transport tomorrow for RT prior to DC to [facility].  Provided verbal order from Dr. Eppie Gibson for lorazepam 0.5 mg 1-2 tabs PRN for anxiety to be given 30 mins before radiation therapy.  Provide my contact # for future needs.  Spoke with Mr. Manthei, provided update on [facility] placement, [facility] transport to [place] this afternoon to start RT, probable transport again tomorrow morning to [facility] prior to transfer to [facility].  Informed him verbal order give to RN Louie Casa for lorazepam.  He voiced understanding.  [name], RN, BSN Head & Neck Oncology Nurse [place] at [place] [phone]

## 2019-05-10 NOTE — Telephone Encounter (Addendum)
Oncology Nurse Navigator Documentation  Faxed to [facility] admissions mgr, appt schedule for Mr. Stripling future appts.  Notification of successful fax transmission received.  Subsequently spoke with her to provided estimated on-site time for tmts, including Monday PUTs.  [name], RN, BSN Head & Neck Oncology Nurse [place] at [place] [phone]

## 2019-05-10 NOTE — Plan of Care (Signed)

## 2019-05-10 NOTE — Telephone Encounter (Signed)
Oncology Nurse Navigator Documentation  In follow-up to this morning's conversation with [facility] Admissions mgr re [facility]'s transportation services inability to meet COVID testing requirement for Mr [place]'s transport for RT appts, rec'd VMM indicating [facility] will accept his placement and arrange transportation.  Transfer expected tomorrow.  Dr. Isidore Moos provided verbal update.  She requested [facility] transport today to start RT, RN Sharee Pimple requested.  [facility] 4 RTT notified of probable late arrival.  [name], Therapist, sports, BSN Head & Neck Oncology Nurse [place] at [place] [phone]

## 2019-05-10 NOTE — Progress Notes (Signed)
Contacted pt's bedside RN at [facility] to inquire pt's stability and availability for transfer via [facility] for radiation treatment. Bedside RN states pt stable and that he will contact [facility] for transfer for treatment only for treatment time of 1320. This RN contacted [facility] for courtesy notification of impending call requesting transfer for treatment only. Loma Sousa, RN BSN

## 2019-05-10 NOTE — Care Management Important Message (Signed)
Important Message  Patient Details  Name: [name] MRN: [id number] Date of Birth: [birth year]   Medicare Important Message Given:  Yes     [name] 05/10/2019, 4:20 PM

## 2019-05-10 NOTE — Telephone Encounter (Signed)
A user error has taken place: encounter opened in error, closed for administrative reasons.

## 2019-05-11 ENCOUNTER — Ambulatory Visit
Admission: RE | Admit: 2019-05-11 | Discharge: 2019-05-11 | Disposition: A | Discharge status: Home / Self Care | Payer: Medicare Other | Source: Ambulatory Visit | Attending: Radiation Oncology | Admitting: Radiation Oncology

## 2019-05-11 DIAGNOSIS — C770 Secondary and unspecified malignant neoplasm of lymph nodes of head, face and neck: Secondary | ICD-10-CM

## 2019-05-11 LAB — GLUCOSE, CAPILLARY
Glucose-Capillary: 107 mg/dL — ABNORMAL HIGH (ref 70–99)
Glucose-Capillary: 95 mg/dL (ref 70–99)
Glucose-Capillary: 122 mg/dL — ABNORMAL HIGH (ref 70–99)
Glucose-Capillary: 175 mg/dL — ABNORMAL HIGH (ref 70–99)

## 2019-05-11 MED ORDER — LORAZEPAM 0.5 MG PO TABS
0.5000 mg | ORAL_TABLET | Freq: Once | ORAL | Status: AC
  Administered 2019-05-11: 0.5 mg via ORAL
  Filled 2019-05-11: qty 1

## 2019-05-11 NOTE — Plan of Care (Signed)
  Problem: Education: Goal: Knowledge of General Education information will improve Description: Including pain rating scale, medication(s)/side effects and non-pharmacologic comfort measures Outcome: Progressing   Problem: Nutrition: Goal: Adequate nutrition will be maintained Outcome: Progressing   Problem: Coping: Goal: Level of anxiety will decrease Outcome: Progressing   

## 2019-05-11 NOTE — Progress Notes (Signed)
A user error has taken place: encounter opened in error, closed for administrative reasons.

## 2019-05-11 NOTE — Plan of Care (Signed)
  Problem: Pain Managment: Goal: General experience of comfort will improve Outcome: Progressing   

## 2019-05-11 NOTE — Progress Notes (Signed)
PT Cancellation Note  Patient Details Name: [name] MRN: [id number] DOB: 1955/08/28   Cancelled Treatment:    Reason Eval/Treat Not Completed: Patient declined, no reason specified. Pt very frustrated and became very upset and tearful. Pt had recently worked with OT and currently eating his dinner, not willing to work with PT at this time. PT will continue to f/u with pt acutely as available.    [place] 05/11/2019, 4:46 PM

## 2019-05-11 NOTE — Care Management (Addendum)
Insurance auth still pending for [facility]. Need updated OT notes for insurance auth. OT eval for imminent discharge ordered for an eval today. PTAR will be arranged once Josem Kaufmann is received. CM team will continue to follow.  Addendum: 05/11/19 @ 1502-Shayne Diguglielmo RNCM-CM paged OT to discuss eval for insurance auth with no response.    Addendum: 05/11/19 @ 1619-Latham Kinzler RNCM-OT eval complete with notes faxed to [facility] for insurance auth. Hopefully Josem Kaufmann will be obtained for discharge to SNF on 05/12/19. DC Summary will need to be updated.   Midge Minium RN, BSN, NCM-BC, ACM-RN [phone]
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
## 2019-05-12 ENCOUNTER — Telehealth: Payer: Self-pay

## 2019-05-12 ENCOUNTER — Ambulatory Visit
Admission: RE | Admit: 2019-05-12 | Discharge: 2019-05-12 | Disposition: A | Discharge status: Home / Self Care | Payer: Medicare Other | Source: Ambulatory Visit | Attending: Radiation Oncology | Admitting: Radiation Oncology

## 2019-05-12 LAB — GLUCOSE, CAPILLARY
Glucose-Capillary: 132 mg/dL — ABNORMAL HIGH (ref 70–99)
Glucose-Capillary: 105 mg/dL — ABNORMAL HIGH (ref 70–99)

## 2019-05-12 MED ORDER — LORAZEPAM 1 MG PO TABS
1.0000 mg | ORAL_TABLET | Freq: Once | ORAL | Status: AC
  Administered 2019-05-12: 1 mg via ORAL
  Filled 2019-05-12: qty 1

## 2019-05-12 NOTE — Progress Notes (Signed)
[facility] unable to transport pt to [facility] center. PTAR arranged for transport there and back. Jennifer over at [facility] made aware.

## 2019-05-12 NOTE — Plan of Care (Signed)
  Problem: Pain Managment: Goal: General experience of comfort will improve Outcome: Progressing   Problem: Elimination: Goal: Will not experience complications related to bowel motility Outcome: Progressing Goal: Will not experience complications related to urinary retention Outcome: Progressing

## 2019-05-12 NOTE — Progress Notes (Signed)
PT Cancellation Note  Patient Details Name: [name] MRN: [id number] DOB: [birth year]   Cancelled Treatment:    Reason Eval/Treat Not Completed: Patient declined, no reason specified(pt stated he is awaiting transport in an hour for radiation and adamantly refused any mobility at this time stating he was too agitated/anxious about awaiting treatment)   Itzae Miralles B Keion Neels 05/12/2019, 12:18 PM  Elwyn Reach, PT Acute Rehabilitation Services Pager: [phone] Office: [phone]

## 2019-05-12 NOTE — Care Management (Addendum)
CM received a call from Elmer Sow) concerning the patients insurance auth for [facility]. Per Manuela Schwartz, patient will need a peer-to-peer for insurance approval by 1330 today by Dr. Sharol Given, per the Medical Director. Dr. Sharol Given will need to call [phone]; option 5. CM informed Dr. Sharol Given via Epic chat.  Addendum: 05/12/19 @ 1125-Keoni Risinger RNCM-Peer-to-peer was completed by Dr. Sharol Given, with insurance auth for [facility] approved. PTAR will be arranged for transport once the patient has returned from his radiation treatment.   Midge Minium RN, BSN, NCM-BC, ACM-RN [phone]

## 2019-05-12 NOTE — TOC Transition Note (Addendum)
Transition of Care [facility]) - CM/SW Discharge Note   Patient Details  Name: [name] MRN: [id number] Date of Birth: 1955/11/13  [facility]) CM/SW Contact:  Midge Minium RN, BSN, NCM-BC, ACM-RN [phone] Phone Number: 05/12/2019, 11:51 AM   Clinical Narrative:    Patient is medically stable to transition to [facility] with insurance approval received. CM will arranged PTAR once the patient has returned from his radiation treatment.   Please call report to: [phone]; Room 305  PTAR arranged for 5:30p  Final next level of care: Skilled Nursing Facility Barriers to Discharge: No Barriers Identified   Patient Goals and CMS Choice Patient states their goals for this hospitalization and ongoing recovery are:: "to rehab then go home" CMS Medicare.gov Compare Post Acute Care list provided to:: Patient Choice offered to / list presented to : Patient  Discharge Placement              Patient chooses bed at: [facility] Patient to be transferred to facility by: [facility] Name of family member notified: Patient/Margaret (spouse) Patient and family notified of of transfer: 05/12/19  Discharge Plan and Services In-house Referral: Clinical Social Work Discharge Planning Services: [employer] Consult Post Acute Care Choice: [place]          DME Arranged: N/A DME Agency: NA       HH Arranged: NA HH Agency: NA        Social Determinants of Health (SDOH) Interventions     Readmission Risk Interventions No flowsheet data found.

## 2019-05-12 NOTE — Progress Notes (Signed)
Patient ID: [name], male   DOB: 09-Oct-1955, 63 y.o.   MRN: [id number] Still awaiting insurance authorization for discharge to skilled nursing.  The wound VAC is functioning well no drainage patient has full range of motion of his knee he has no complaints he is continuing to improve with his therapy.  Plan for discharge today no change in the discharge summary.

## 2019-05-12 NOTE — Telephone Encounter (Signed)
I spoke to the charge nurse on [place] today regarding [name] radiation therapy that is scheduled for 1:20 today. I asked that they call [facility] and have a ride scheduled for him from [facility] to [place] center. I also asked that he be given 1 mg of ativan before he leaves to help with anxiety during radiation treatment. He had a difficult time with treatment yesterday and stayed at the [facility] for several hours until he was able to complete his treatment after given 1 mg of ativan po while here. She voiced her understanding and told me she would notify his nurse. I also called [facility] to update them regarding his need for transportation today.

## 2019-05-13 ENCOUNTER — Ambulatory Visit
Admission: RE | Admit: 2019-05-13 | Discharge: 2019-05-13 | Disposition: A | Discharge status: Home / Self Care | Payer: Medicare Other | Source: Ambulatory Visit | Attending: Radiation Oncology | Admitting: Radiation Oncology

## 2019-05-13 ENCOUNTER — Other Ambulatory Visit: Payer: Self-pay

## 2019-05-13 DIAGNOSIS — C3412 Malignant neoplasm of upper lobe, left bronchus or lung: Secondary | ICD-10-CM | POA: Diagnosis not present

## 2019-05-13 DIAGNOSIS — Z510 Encounter for antineoplastic radiation therapy: Secondary | ICD-10-CM | POA: Diagnosis not present

## 2019-05-13 DIAGNOSIS — C07 Malignant neoplasm of parotid gland: Secondary | ICD-10-CM | POA: Diagnosis present

## 2019-05-16 ENCOUNTER — Ambulatory Visit
Admission: RE | Admit: 2019-05-16 | Discharge: 2019-05-16 | Disposition: A | Discharge status: Home / Self Care | Payer: Medicare Other | Source: Ambulatory Visit | Attending: Radiation Oncology | Admitting: Radiation Oncology

## 2019-05-16 ENCOUNTER — Other Ambulatory Visit: Payer: Self-pay

## 2019-05-16 ENCOUNTER — Encounter: Payer: Self-pay | Admitting: Orthopedic Surgery

## 2019-05-16 ENCOUNTER — Ambulatory Visit (INDEPENDENT_AMBULATORY_CARE_PROVIDER_SITE_OTHER): Payer: Medicare Other | Admitting: Orthopedic Surgery

## 2019-05-16 VITALS — Ht 68.5 in | Wt 159.0 lb

## 2019-05-16 DIAGNOSIS — Z89511 Acquired absence of right leg below knee: Secondary | ICD-10-CM

## 2019-05-16 DIAGNOSIS — S88111A Complete traumatic amputation at level between knee and ankle, right lower leg, initial encounter: Secondary | ICD-10-CM

## 2019-05-16 DIAGNOSIS — C07 Malignant neoplasm of parotid gland: Secondary | ICD-10-CM | POA: Diagnosis not present

## 2019-05-16 MED ORDER — SONAFINE EX EMUL
1.0000 "application " | Freq: Once | CUTANEOUS | Status: AC
  Administered 2019-05-16: 1 via TOPICAL

## 2019-05-16 NOTE — Progress Notes (Signed)
Pt here for patient teaching.  Pt given Radiation and You booklet, Managing Acute Radiation Side Effects for Head and Neck Cancer handout, skin care instructions and Sonafine.  Reviewed areas of pertinence such as fatigue, hair loss, mouth changes, skin changes, throat changes, earaches and taste changes . Pt able to give teach back of to pat skin, use unscented/gentle soap and drink plenty of water,apply Sonafine bid, avoid applying anything to skin within 4 hours of treatment and to use an electric razor if they must shave. Pt verbalizes understanding of information given and will contact nursing with any questions or concerns.     Http://rtanswers.org/treatmentinformation/whattoexpect/index

## 2019-05-17 ENCOUNTER — Encounter: Payer: Self-pay | Admitting: Orthopedic Surgery

## 2019-05-17 ENCOUNTER — Other Ambulatory Visit: Payer: Self-pay

## 2019-05-17 ENCOUNTER — Ambulatory Visit
Admission: RE | Admit: 2019-05-17 | Discharge: 2019-05-17 | Disposition: A | Discharge status: Home / Self Care | Payer: Medicare Other | Source: Ambulatory Visit | Attending: Radiation Oncology | Admitting: Radiation Oncology

## 2019-05-17 DIAGNOSIS — C07 Malignant neoplasm of parotid gland: Secondary | ICD-10-CM | POA: Diagnosis not present
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
## 2019-05-18 ENCOUNTER — Other Ambulatory Visit: Payer: Self-pay

## 2019-05-18 ENCOUNTER — Ambulatory Visit
Admission: RE | Admit: 2019-05-18 | Discharge: 2019-05-18 | Disposition: A | Discharge status: Home / Self Care | Payer: Medicare Other | Source: Ambulatory Visit | Attending: Radiation Oncology | Admitting: Radiation Oncology

## 2019-05-18 DIAGNOSIS — C07 Malignant neoplasm of parotid gland: Secondary | ICD-10-CM | POA: Diagnosis not present

## 2019-05-19 ENCOUNTER — Ambulatory Visit
Admission: RE | Admit: 2019-05-19 | Discharge: 2019-05-19 | Disposition: A | Discharge status: Home / Self Care | Payer: Medicare Other | Source: Ambulatory Visit | Attending: Radiation Oncology | Admitting: Radiation Oncology

## 2019-05-19 ENCOUNTER — Other Ambulatory Visit: Payer: Self-pay

## 2019-05-19 DIAGNOSIS — C07 Malignant neoplasm of parotid gland: Secondary | ICD-10-CM | POA: Diagnosis not present

## 2019-05-20 ENCOUNTER — Ambulatory Visit
Admission: RE | Admit: 2019-05-20 | Discharge: 2019-05-20 | Disposition: A | Discharge status: Home / Self Care | Payer: Medicare Other | Source: Ambulatory Visit | Attending: Radiation Oncology | Admitting: Radiation Oncology

## 2019-05-20 ENCOUNTER — Other Ambulatory Visit: Payer: Self-pay

## 2019-05-20 DIAGNOSIS — C07 Malignant neoplasm of parotid gland: Secondary | ICD-10-CM | POA: Diagnosis not present

## 2019-05-23 ENCOUNTER — Other Ambulatory Visit: Payer: Self-pay

## 2019-05-23 ENCOUNTER — Ambulatory Visit
Admission: RE | Admit: 2019-05-23 | Discharge: 2019-05-23 | Disposition: A | Discharge status: Home / Self Care | Payer: Medicare Other | Source: Ambulatory Visit | Attending: Radiation Oncology | Admitting: Radiation Oncology

## 2019-05-23 DIAGNOSIS — C07 Malignant neoplasm of parotid gland: Secondary | ICD-10-CM | POA: Diagnosis not present

## 2019-05-23 DIAGNOSIS — C770 Secondary and unspecified malignant neoplasm of lymph nodes of head, face and neck: Secondary | ICD-10-CM

## 2019-05-23 MED ORDER — ACETAMINOPHEN 325 MG PO TABS
975.0000 mg | ORAL_TABLET | Freq: Once | ORAL | Status: AC
  Administered 2019-05-23: 975 mg via ORAL
  Filled 2019-05-23: qty 3

## 2019-05-24 ENCOUNTER — Other Ambulatory Visit: Payer: Self-pay

## 2019-05-24 ENCOUNTER — Ambulatory Visit
Admission: RE | Admit: 2019-05-24 | Discharge: 2019-05-24 | Disposition: A | Discharge status: Home / Self Care | Payer: Medicare Other | Source: Ambulatory Visit | Attending: Radiation Oncology | Admitting: Radiation Oncology

## 2019-05-24 DIAGNOSIS — C07 Malignant neoplasm of parotid gland: Secondary | ICD-10-CM | POA: Diagnosis not present

## 2019-05-25 ENCOUNTER — Other Ambulatory Visit: Payer: Self-pay

## 2019-05-25 ENCOUNTER — Ambulatory Visit (INDEPENDENT_AMBULATORY_CARE_PROVIDER_SITE_OTHER): Payer: Medicare Other | Admitting: Family

## 2019-05-25 ENCOUNTER — Encounter: Payer: Self-pay | Admitting: Family

## 2019-05-25 ENCOUNTER — Ambulatory Visit
Admission: RE | Admit: 2019-05-25 | Discharge: 2019-05-25 | Disposition: A | Discharge status: Home / Self Care | Payer: Medicare Other | Source: Ambulatory Visit | Attending: Radiation Oncology | Admitting: Radiation Oncology

## 2019-05-25 VITALS — Ht 68.5 in | Wt 159.0 lb

## 2019-05-25 DIAGNOSIS — S88111A Complete traumatic amputation at level between knee and ankle, right lower leg, initial encounter: Secondary | ICD-10-CM

## 2019-05-25 DIAGNOSIS — C07 Malignant neoplasm of parotid gland: Secondary | ICD-10-CM | POA: Diagnosis not present

## 2019-05-25 DIAGNOSIS — Z89511 Acquired absence of right leg below knee: Secondary | ICD-10-CM
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
## 2019-05-26 ENCOUNTER — Ambulatory Visit
Admission: RE | Admit: 2019-05-26 | Discharge: 2019-05-26 | Disposition: A | Discharge status: Home / Self Care | Payer: Medicare Other | Source: Ambulatory Visit | Attending: Radiation Oncology | Admitting: Radiation Oncology

## 2019-05-26 ENCOUNTER — Other Ambulatory Visit: Payer: Self-pay

## 2019-05-26 DIAGNOSIS — C07 Malignant neoplasm of parotid gland: Secondary | ICD-10-CM | POA: Diagnosis not present

## 2019-05-27 ENCOUNTER — Ambulatory Visit
Admission: RE | Admit: 2019-05-27 | Discharge: 2019-05-27 | Disposition: A | Discharge status: Home / Self Care | Payer: Medicare Other | Source: Ambulatory Visit | Attending: Radiation Oncology | Admitting: Radiation Oncology

## 2019-05-27 ENCOUNTER — Other Ambulatory Visit: Payer: Self-pay

## 2019-05-27 DIAGNOSIS — C07 Malignant neoplasm of parotid gland: Secondary | ICD-10-CM | POA: Diagnosis not present

## 2019-05-30 ENCOUNTER — Encounter (HOSPITAL_COMMUNITY): Payer: Self-pay

## 2019-05-30 ENCOUNTER — Other Ambulatory Visit: Payer: Self-pay

## 2019-05-30 ENCOUNTER — Emergency Department (HOSPITAL_COMMUNITY)
Admission: EM | Admit: 2019-05-30 | Discharge: 2019-05-30 | Disposition: A | Discharge status: Home / Self Care | Payer: Medicare Other | Attending: Emergency Medicine | Admitting: Emergency Medicine

## 2019-05-30 ENCOUNTER — Ambulatory Visit
Admission: RE | Admit: 2019-05-30 | Discharge: 2019-05-30 | Disposition: A | Discharge status: Home / Self Care | Payer: Medicare Other | Source: Ambulatory Visit | Attending: Radiation Oncology | Admitting: Radiation Oncology

## 2019-05-30 DIAGNOSIS — E119 Type 2 diabetes mellitus without complications: Secondary | ICD-10-CM | POA: Insufficient documentation

## 2019-05-30 DIAGNOSIS — R5383 Other fatigue: Secondary | ICD-10-CM | POA: Diagnosis not present

## 2019-05-30 DIAGNOSIS — F17210 Nicotine dependence, cigarettes, uncomplicated: Secondary | ICD-10-CM | POA: Insufficient documentation

## 2019-05-30 DIAGNOSIS — Z7984 Long term (current) use of oral hypoglycemic drugs: Secondary | ICD-10-CM | POA: Diagnosis not present

## 2019-05-30 DIAGNOSIS — I959 Hypotension, unspecified: Secondary | ICD-10-CM | POA: Insufficient documentation

## 2019-05-30 DIAGNOSIS — Z79899 Other long term (current) drug therapy: Secondary | ICD-10-CM | POA: Diagnosis not present

## 2019-05-30 DIAGNOSIS — E860 Dehydration: Secondary | ICD-10-CM | POA: Insufficient documentation

## 2019-05-30 DIAGNOSIS — Z7982 Long term (current) use of aspirin: Secondary | ICD-10-CM | POA: Insufficient documentation

## 2019-05-30 LAB — COMPREHENSIVE METABOLIC PANEL
ALT: 17 U/L (ref 0–44)
AST: 17 U/L (ref 15–41)
Albumin: 3.5 g/dL (ref 3.5–5.0)
Alkaline Phosphatase: 78 U/L (ref 38–126)
Anion gap: 8 (ref 5–15)
BUN: 16 mg/dL (ref 8–23)
CO2: 26 mmol/L (ref 22–32)
Calcium: 8.9 mg/dL (ref 8.9–10.3)
Chloride: 102 mmol/L (ref 98–111)
Creatinine, Ser: 0.6 mg/dL — ABNORMAL LOW (ref 0.61–1.24)
GFR calc Af Amer: >60 mL/min (ref >60)
GFR calc non Af Amer: >60 mL/min (ref >60)
Glucose, Bld: 114 mg/dL — ABNORMAL HIGH (ref 70–99)
Potassium: 4 mmol/L (ref 3.5–5.1)
Sodium: 136 mmol/L (ref 135–145)
Total Bilirubin: 0.4 mg/dL (ref 0.3–1.2)
Total Protein: 6.2 g/dL — ABNORMAL LOW (ref 6.5–8.1)

## 2019-05-30 LAB — CBC WITH DIFFERENTIAL/PLATELET
Abs Immature Granulocytes: 0.03 10*3/uL (ref 0.00–0.07)
Basophils Absolute: 0.1 10*3/uL (ref 0.0–0.1)
Basophils Relative: 1 %
Eosinophils Absolute: 0.4 10*3/uL (ref 0.0–0.5)
Eosinophils Relative: 5 %
HCT: 32.8 % — ABNORMAL LOW (ref 39.0–52.0)
Hemoglobin: 10.6 g/dL — ABNORMAL LOW (ref 13.0–17.0)
Immature Granulocytes: 0 %
Lymphocytes Relative: 13 %
Lymphs Abs: 1 10*3/uL (ref 0.7–4.0)
MCH: 30.4 pg (ref 26.0–34.0)
MCHC: 32.3 g/dL (ref 30.0–36.0)
MCV: 94 fL (ref 80.0–100.0)
Monocytes Absolute: 0.8 10*3/uL (ref 0.1–1.0)
Monocytes Relative: 11 %
Neutro Abs: 5.1 10*3/uL (ref 1.7–7.7)
Neutrophils Relative %: 70 %
Platelets: 162 10*3/uL (ref 150–400)
RBC: 3.49 MIL/uL — ABNORMAL LOW (ref 4.22–5.81)
RDW: 15.3 % (ref 11.5–15.5)
WBC: 7.3 10*3/uL (ref 4.0–10.5)
nRBC: 0 % (ref 0.0–0.2)

## 2019-05-30 LAB — LACTIC ACID, PLASMA: Lactic Acid, Venous: 1.8 mmol/L (ref 0.5–1.9)

## 2019-05-30 LAB — PROTIME-INR
INR: 0.9 (ref 0.8–1.2)
Prothrombin Time: 11.7 seconds (ref 11.4–15.2)

## 2019-05-30 MED ORDER — LACTATED RINGERS IV BOLUS
1000.0000 mL | Freq: Once | INTRAVENOUS | Status: AC
  Administered 2019-05-30: 1000 mL via INTRAVENOUS

## 2019-05-30 MED ORDER — SODIUM CHLORIDE 0.9 % IV BOLUS: 1000.0000 mL | Freq: Once | INTRAVENOUS | Status: DC

## 2019-05-30 NOTE — Progress Notes (Signed)
[name] presented for Devin weekly undertreat appointment with Dr. Isidore Moos for Devin current radiation therapy to Devin right parotid and thorax region. He has received 15 of 33 fractions. Upon presentation Devin vital signs were as follows: BP 75/63, HR 86, R 18, and oxygen saturation 95 on RA.  He is very sleepy, but arousable and alert and oriented. He reports eating and drinking well since being discharged from [name] home on Saturday, but he is a poor historian and cannot tell me exactly how much and what he has had. Dr. Isidore Moos requested I call symptom management for evaluation and they recommended Emergency Department evaluation. I provided the above information to the ED charge nurse and they accepted him. I called and informed [name] and she met [place] in the [place] lobby and presented with [place] to the Emergency Room. Report was provided to the accepting RN.
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
## 2019-05-30 NOTE — Discharge Instructions (Signed)
Please go to your previously scheduled radiation appointment.  If the meantime you develop fever, chest pain, difficulty breathing or other concerns symptom, please return to ER for reassessment.

## 2019-05-30 NOTE — ED Triage Notes (Signed)
Patient arrived from cancer center. Patient is from home, wife at bedside, AOx4 and ambulatory with device / wheelchair. Patient has cancer and was sent over due to having hypotension, 70/50. Patient recently had BKA and is actively receiving radiation treatment in lungs, and side of neck/head.

## 2019-05-31 ENCOUNTER — Ambulatory Visit
Admission: RE | Admit: 2019-05-31 | Discharge: 2019-05-31 | Disposition: A | Discharge status: Home / Self Care | Payer: Medicare Other | Source: Ambulatory Visit | Attending: Radiation Oncology | Admitting: Radiation Oncology

## 2019-05-31 DIAGNOSIS — C07 Malignant neoplasm of parotid gland: Secondary | ICD-10-CM | POA: Diagnosis not present

## 2019-06-01 ENCOUNTER — Other Ambulatory Visit: Payer: Self-pay

## 2019-06-01 ENCOUNTER — Ambulatory Visit
Admission: RE | Admit: 2019-06-01 | Discharge: 2019-06-01 | Disposition: A | Discharge status: Home / Self Care | Payer: Medicare Other | Source: Ambulatory Visit | Attending: Radiation Oncology | Admitting: Radiation Oncology

## 2019-06-01 DIAGNOSIS — C07 Malignant neoplasm of parotid gland: Secondary | ICD-10-CM | POA: Diagnosis not present

## 2019-06-02 ENCOUNTER — Telehealth: Payer: Self-pay | Admitting: Orthopedic Surgery

## 2019-06-02 ENCOUNTER — Ambulatory Visit
Admission: RE | Admit: 2019-06-02 | Discharge: 2019-06-02 | Disposition: A | Discharge status: Home / Self Care | Payer: Medicare Other | Source: Ambulatory Visit | Attending: Radiation Oncology | Admitting: Radiation Oncology

## 2019-06-02 ENCOUNTER — Other Ambulatory Visit: Payer: Self-pay

## 2019-06-02 DIAGNOSIS — C07 Malignant neoplasm of parotid gland: Secondary | ICD-10-CM | POA: Diagnosis not present

## 2019-06-02 NOTE — Telephone Encounter (Signed)
I called [employer] and spoke to Stanton about patient's medication reconciliation. She wanted [place] to know that she has not been able to contact patient. I informed her that patient medication list will be updated upon arrival next week.

## 2019-06-02 NOTE — Telephone Encounter (Signed)
Olivia Mackie from [facility] lmom requesting a call from Dr Jess Barters assistant regarding patient.

## 2019-06-03 ENCOUNTER — Other Ambulatory Visit: Payer: Self-pay

## 2019-06-03 ENCOUNTER — Ambulatory Visit
Admission: RE | Admit: 2019-06-03 | Discharge: 2019-06-03 | Disposition: A | Discharge status: Home / Self Care | Payer: Medicare Other | Source: Ambulatory Visit | Attending: Radiation Oncology | Admitting: Radiation Oncology

## 2019-06-03 DIAGNOSIS — C07 Malignant neoplasm of parotid gland: Secondary | ICD-10-CM | POA: Diagnosis not present

## 2019-06-06 ENCOUNTER — Other Ambulatory Visit: Payer: Self-pay

## 2019-06-06 ENCOUNTER — Ambulatory Visit
Admission: RE | Admit: 2019-06-06 | Discharge: 2019-06-06 | Disposition: A | Discharge status: Home / Self Care | Payer: Medicare Other | Source: Ambulatory Visit | Attending: Radiation Oncology | Admitting: Radiation Oncology

## 2019-06-06 ENCOUNTER — Other Ambulatory Visit: Payer: Self-pay | Admitting: Radiation Oncology

## 2019-06-06 DIAGNOSIS — Z510 Encounter for antineoplastic radiation therapy: Secondary | ICD-10-CM | POA: Insufficient documentation

## 2019-06-06 DIAGNOSIS — C3412 Malignant neoplasm of upper lobe, left bronchus or lung: Secondary | ICD-10-CM | POA: Insufficient documentation

## 2019-06-06 DIAGNOSIS — C07 Malignant neoplasm of parotid gland: Secondary | ICD-10-CM | POA: Diagnosis not present

## 2019-06-06 DIAGNOSIS — C770 Secondary and unspecified malignant neoplasm of lymph nodes of head, face and neck: Secondary | ICD-10-CM

## 2019-06-06 MED ORDER — LIDOCAINE VISCOUS HCL 2 % MT SOLN: OROMUCOSAL | 5 refills | Status: DC

## 2019-06-07 ENCOUNTER — Ambulatory Visit
Admission: RE | Admit: 2019-06-07 | Discharge: 2019-06-07 | Disposition: A | Discharge status: Home / Self Care | Payer: Medicare Other | Source: Ambulatory Visit | Attending: Radiation Oncology | Admitting: Radiation Oncology

## 2019-06-07 ENCOUNTER — Other Ambulatory Visit: Payer: Self-pay

## 2019-06-07 DIAGNOSIS — C07 Malignant neoplasm of parotid gland: Secondary | ICD-10-CM | POA: Diagnosis not present

## 2019-06-08 ENCOUNTER — Ambulatory Visit
Admission: RE | Admit: 2019-06-08 | Discharge: 2019-06-08 | Disposition: A | Discharge status: Home / Self Care | Payer: Medicare Other | Source: Ambulatory Visit | Attending: Radiation Oncology | Admitting: Radiation Oncology

## 2019-06-08 ENCOUNTER — Ambulatory Visit (INDEPENDENT_AMBULATORY_CARE_PROVIDER_SITE_OTHER): Payer: Medicare Other | Admitting: Family

## 2019-06-08 ENCOUNTER — Other Ambulatory Visit: Payer: Self-pay

## 2019-06-08 ENCOUNTER — Encounter: Payer: Self-pay | Admitting: Family

## 2019-06-08 VITALS — Ht 68.0 in | Wt 158.0 lb

## 2019-06-08 DIAGNOSIS — Z89511 Acquired absence of right leg below knee: Secondary | ICD-10-CM

## 2019-06-08 DIAGNOSIS — C07 Malignant neoplasm of parotid gland: Secondary | ICD-10-CM | POA: Diagnosis not present

## 2019-06-08 DIAGNOSIS — S88111A Complete traumatic amputation at level between knee and ankle, right lower leg, initial encounter: Secondary | ICD-10-CM

## 2019-06-08 MED ORDER — SERTRALINE HCL 25 MG PO TABS: 25.0000 mg | ORAL_TABLET | Freq: Every day | ORAL | 0 refills | Status: DC

## 2019-06-08 MED ORDER — GABAPENTIN 300 MG PO CAPS: 300.0000 mg | ORAL_CAPSULE | Freq: Two times a day (BID) | ORAL | 3 refills | Status: AC
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
## 2019-06-09 ENCOUNTER — Other Ambulatory Visit: Payer: Self-pay

## 2019-06-09 ENCOUNTER — Ambulatory Visit
Admission: RE | Admit: 2019-06-09 | Discharge: 2019-06-09 | Disposition: A | Discharge status: Home / Self Care | Payer: Medicare Other | Source: Ambulatory Visit | Attending: Radiation Oncology | Admitting: Radiation Oncology

## 2019-06-09 DIAGNOSIS — C07 Malignant neoplasm of parotid gland: Secondary | ICD-10-CM | POA: Diagnosis not present

## 2019-06-10 ENCOUNTER — Ambulatory Visit
Admission: RE | Admit: 2019-06-10 | Discharge: 2019-06-10 | Disposition: A | Discharge status: Home / Self Care | Payer: Medicare Other | Source: Ambulatory Visit | Attending: Radiation Oncology | Admitting: Radiation Oncology

## 2019-06-10 ENCOUNTER — Other Ambulatory Visit: Payer: Self-pay

## 2019-06-10 DIAGNOSIS — C07 Malignant neoplasm of parotid gland: Secondary | ICD-10-CM | POA: Diagnosis not present

## 2019-06-13 ENCOUNTER — Ambulatory Visit
Admission: RE | Admit: 2019-06-13 | Discharge: 2019-06-13 | Disposition: A | Discharge status: Home / Self Care | Payer: Medicare Other | Source: Ambulatory Visit | Attending: Radiation Oncology | Admitting: Radiation Oncology

## 2019-06-13 ENCOUNTER — Other Ambulatory Visit: Payer: Self-pay

## 2019-06-13 DIAGNOSIS — C07 Malignant neoplasm of parotid gland: Secondary | ICD-10-CM | POA: Diagnosis not present

## 2019-06-14 ENCOUNTER — Ambulatory Visit
Admission: RE | Admit: 2019-06-14 | Discharge: 2019-06-14 | Disposition: A | Discharge status: Home / Self Care | Payer: Medicare Other | Source: Ambulatory Visit | Attending: Radiation Oncology | Admitting: Radiation Oncology

## 2019-06-14 ENCOUNTER — Other Ambulatory Visit: Payer: Self-pay

## 2019-06-14 DIAGNOSIS — C07 Malignant neoplasm of parotid gland: Secondary | ICD-10-CM | POA: Diagnosis not present

## 2019-06-15 ENCOUNTER — Ambulatory Visit: Payer: Medicare Other

## 2019-06-15 ENCOUNTER — Other Ambulatory Visit: Payer: Self-pay

## 2019-06-15 ENCOUNTER — Ambulatory Visit
Admission: RE | Admit: 2019-06-15 | Discharge: 2019-06-15 | Disposition: A | Discharge status: Home / Self Care | Payer: Medicare Other | Source: Ambulatory Visit | Attending: Radiation Oncology | Admitting: Radiation Oncology

## 2019-06-15 DIAGNOSIS — C07 Malignant neoplasm of parotid gland: Secondary | ICD-10-CM | POA: Diagnosis not present

## 2019-06-16 ENCOUNTER — Other Ambulatory Visit: Payer: Self-pay

## 2019-06-16 ENCOUNTER — Ambulatory Visit: Payer: Medicare Other

## 2019-06-16 DIAGNOSIS — C07 Malignant neoplasm of parotid gland: Secondary | ICD-10-CM | POA: Diagnosis not present

## 2019-06-17 ENCOUNTER — Other Ambulatory Visit: Payer: Self-pay | Admitting: Radiation Oncology

## 2019-06-17 ENCOUNTER — Other Ambulatory Visit: Payer: Self-pay

## 2019-06-17 ENCOUNTER — Ambulatory Visit: Payer: Medicare Other

## 2019-06-17 DIAGNOSIS — C07 Malignant neoplasm of parotid gland: Secondary | ICD-10-CM | POA: Diagnosis not present

## 2019-06-17 MED ORDER — LORAZEPAM 0.5 MG PO TABS: ORAL_TABLET | ORAL | 0 refills | Status: DC

## 2019-06-20 ENCOUNTER — Other Ambulatory Visit: Payer: Self-pay

## 2019-06-20 ENCOUNTER — Ambulatory Visit
Admission: RE | Admit: 2019-06-20 | Discharge: 2019-06-20 | Disposition: A | Discharge status: Home / Self Care | Payer: Medicare Other | Source: Ambulatory Visit | Attending: Radiation Oncology | Admitting: Radiation Oncology

## 2019-06-20 DIAGNOSIS — C07 Malignant neoplasm of parotid gland: Secondary | ICD-10-CM | POA: Diagnosis not present

## 2019-06-21 ENCOUNTER — Other Ambulatory Visit: Payer: Self-pay

## 2019-06-21 ENCOUNTER — Ambulatory Visit
Admission: RE | Admit: 2019-06-21 | Discharge: 2019-06-21 | Disposition: A | Discharge status: Home / Self Care | Payer: Medicare Other | Source: Ambulatory Visit | Attending: Radiation Oncology | Admitting: Radiation Oncology

## 2019-06-21 DIAGNOSIS — C07 Malignant neoplasm of parotid gland: Secondary | ICD-10-CM | POA: Diagnosis not present

## 2019-06-22 ENCOUNTER — Other Ambulatory Visit: Payer: Self-pay

## 2019-06-22 ENCOUNTER — Ambulatory Visit
Admission: RE | Admit: 2019-06-22 | Discharge: 2019-06-22 | Disposition: A | Discharge status: Home / Self Care | Payer: Medicare Other | Source: Ambulatory Visit | Attending: Radiation Oncology | Admitting: Radiation Oncology

## 2019-06-22 ENCOUNTER — Ambulatory Visit: Payer: Medicare Other

## 2019-06-22 DIAGNOSIS — C07 Malignant neoplasm of parotid gland: Secondary | ICD-10-CM | POA: Diagnosis not present

## 2019-06-23 ENCOUNTER — Other Ambulatory Visit: Payer: Self-pay

## 2019-06-23 ENCOUNTER — Ambulatory Visit
Admission: RE | Admit: 2019-06-23 | Discharge: 2019-06-23 | Disposition: A | Discharge status: Home / Self Care | Payer: Medicare Other | Source: Ambulatory Visit | Attending: Radiation Oncology | Admitting: Radiation Oncology

## 2019-06-23 ENCOUNTER — Encounter: Payer: Self-pay | Admitting: *Deleted

## 2019-06-23 ENCOUNTER — Encounter: Payer: Self-pay | Admitting: Radiation Oncology

## 2019-06-23 DIAGNOSIS — C07 Malignant neoplasm of parotid gland: Secondary | ICD-10-CM | POA: Diagnosis not present

## 2019-06-27 NOTE — Progress Notes (Signed)
Oncology Nurse Navigator Documentation  Met with patient after final RT to offer support and to celebrate end of radiation treatment.   Provided verbal/written post-RT guidance:  Importance of keeping all follow-up appts, provided him Epic calendar noted 12/4 follow-up with Dr. Isidore Moos.  Importance of protecting treatment area from sun.  Continuation of Sonafine application 2-3 times daily, application of abx ointment to areas of raw skin; when supply of Sonafine exhausted transition to OTC lotion with vitamin E. Explained my role as navigator will continue for several more months, I will be calling or joining him during follow-up visits.   He agreed to call me with needs/concerns.    [name], RN, BSN Head & Neck Oncology [facility] [place] [phone]

## 2019-07-08 ENCOUNTER — Ambulatory Visit
Admission: RE | Admit: 2019-07-08 | Discharge: 2019-07-08 | Disposition: A | Discharge status: Home / Self Care | Payer: Medicare Other | Source: Ambulatory Visit | Attending: Radiation Oncology | Admitting: Radiation Oncology

## 2019-07-08 NOTE — Progress Notes (Signed)
  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 He reported fatigue, decreased appetite, dry mouth, thick saliva, throat tightness, and taste changes. He denied pain and mouth ulcers. He experienced moist desquamation over her ear and dry desquamation over her right neck as well as blisters to his lips.  Plan: The patient will follow-up with radiation oncology in 2 weeks.  ________________________________________________   Eppie Gibson, MD   This document serves as a record of services personally performed by Eppie Gibson, MD. It was created on her behalf by Wilburn Mylar, a trained medical scribe. The creation of this record is based on the scribe's personal observations and the provider's statements to them. This document has been checked and approved by the attending provider.

## 2019-07-15 ENCOUNTER — Encounter: Payer: Self-pay | Admitting: Radiation Oncology

## 2019-07-15 ENCOUNTER — Ambulatory Visit
Admission: RE | Admit: 2019-07-15 | Discharge: 2019-07-15 | Disposition: A | Discharge status: Home / Self Care | Payer: Medicare Other | Source: Ambulatory Visit | Attending: Radiation Oncology | Admitting: Radiation Oncology

## 2019-07-15 DIAGNOSIS — C770 Secondary and unspecified malignant neoplasm of lymph nodes of head, face and neck: Secondary | ICD-10-CM

## 2019-07-15 DIAGNOSIS — C3412 Malignant neoplasm of upper lobe, left bronchus or lung: Secondary | ICD-10-CM

## 2019-07-15 DIAGNOSIS — C07 Malignant neoplasm of parotid gland: Secondary | ICD-10-CM

## 2019-07-15 NOTE — Progress Notes (Signed)
Radiation Oncology         [phone] ________________________________  Name: [name] MRN: [id number]  Date: 07/15/2019  DOB: [birth year]  Follow-Up Telephone Note by telephone as patient was unable to access MyChart video during pandemic precautions  CC: Patient, No Pcp Per  Nicholas Lose, MD  Diagnosis and Prior Radiotherapy:       ICD-10-CM   1. 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 Cancer of upper lobe of left lung (HCC)  C34.12 NM PET Image Restag (PS) Skull Base To Thigh  3. Cancer of parotid gland ([place])  C07 NM PET Image Restag (PS) Skull Base To Thigh    05/10/2019 through 06/23/2019 Site Technique Total Dose (Gy) Dose per Fx (Gy) Completed Fx Beam Energies  Head & neck: HN_Rt_parotid IMRT 66/66 2 33/33 6X  Thorax: Chest_L_lung IMRT 66/66 2 33/33 6X   CHIEF COMPLAINT:  Here for follow-up and surveillance of left lung and right periparotid cancer  Narrative:    He is in [place] visiting his son. He is feeling better each week.  He states right right ear is still sore and scabbed. Dry scabbing.  Post operative swelling still present.  Still swelling in throat, some phlegm, both improving.    "Nothing else" is bothering him. I spoke with his wife, too, and she confirms he is doing relatively well.   ALLERGIES:  is allergic to adhesive [tape]; bactrim ds [sulfamethoxazole-trimethoprim]; and trental [pentoxifylline].  Meds: Current Outpatient Medications  Medication Sig Dispense Refill  . aspirin 81 MG EC tablet Take 1 tablet (81 mg total) by mouth daily. 30 tablet 3  . gabapentin (NEURONTIN) 300 MG capsule Take 1 capsule (300 mg total) by mouth 2 (two) times daily. 60 capsule 3  . glipiZIDE (GLUCOTROL) 5 MG tablet Take 2 tablets (10 mg total) by mouth daily before breakfast. (Patient taking differently: Take 5 mg by mouth daily before breakfast. ) 30 tablet 3  . HYDROcodone-acetaminophen  (NORCO/VICODIN) 5-325 MG tablet Take 1 tablet by mouth every 8 (eight) hours as needed for moderate pain.    [name] Kitchen lidocaine (XYLOCAINE) 2 % solution Swish and spit 67mL every 2 hours PRN, or apply to mucosa with finger. 100 mL 5  . LORazepam (ATIVAN) 0.5 MG tablet Take 1-2 tablets PO, PRN anxiety, 30 minutes before radiotherapy. 4 tablet 0  . sertraline (ZOLOFT) 25 MG tablet Take 1 tablet (25 mg total) by mouth daily. 90 tablet 0   No current facility-administered medications for this encounter.    Physical Findings: The patient is in no acute distress. Patient is alert and oriented. Wt Readings from Last 3 Encounters:  06/08/19 158 lb (71.7 kg)  05/30/19 158 lb 15.2 oz (72.1 kg)  05/25/19 159 lb (72.1 kg)    vitals were not taken for this visit. .    Lab Findings: Lab Results  Component Value Date   WBC 7.3 05/30/2019   HGB 10.6 (L) 05/30/2019   HCT 32.8 (L) 05/30/2019   MCV 94.0 05/30/2019   PLT 162 05/30/2019    Lab Results  Component Value Date   TSH 1.039 07/08/2012    Radiographic Findings: No results found.  Impression/Plan:    1) Cancer status: Healing from radiotherapy to right neck and lung  2) Nutritional Status: No complaints, encouraged to push nutrition for healing  3) Risk Factors: The patient has been educated about risk factors including alcohol and  tobacco abuse; they understand that avoidance of alcohol and tobacco is important to prevent recurrences as well as other cancers  4) Swallowing: No issues  5)  Thyroid function: Unlikely to be affected by radiotherapy Lab Results  Component Value Date   TSH 1.039 07/08/2012    6) Other: Skin is still irritated.  I recommended putting Neosporin on any open areas and vitamin E lotion in the other parts of the face and neck that were exposed to radiation  7) Follow-up in end of February with PET scan for restaging. The patient was encouraged to call with any issues or questions before then.  This encounter  was provided by telemedicine platform by telephone as patient was unable to access MyChart video during pandemic precautions The patient has given verbal consent for this type of encounter and has been advised to only accept a meeting of this type in a secure network environment. The time spent during this encounter was 8 minutes. The attendants for this meeting include Eppie Gibson  and [name].  During the encounter, Eppie Gibson was located at [facility] Radiation Oncology Department.  ZYMIERE TROSTLE was located at his son's home.   _____________________________________   Eppie Gibson, MD   This document serves as a record of services personally performed by Eppie Gibson, MD. It was created on her behalf by Wilburn Mylar, a trained medical scribe. The creation of this record is based on the scribe's personal observations and the provider's statements to them. This document has been checked and approved by the attending provider.

## 2019-07-26 ENCOUNTER — Other Ambulatory Visit: Payer: Self-pay

## 2019-07-26 ENCOUNTER — Encounter: Payer: Self-pay | Admitting: Orthopedic Surgery

## 2019-07-26 ENCOUNTER — Ambulatory Visit: Payer: Medicare Other | Admitting: Family

## 2019-07-26 ENCOUNTER — Ambulatory Visit (INDEPENDENT_AMBULATORY_CARE_PROVIDER_SITE_OTHER): Payer: Medicare Other | Admitting: Orthopedic Surgery

## 2019-07-26 VITALS — Ht 68.0 in | Wt 158.0 lb

## 2019-07-26 DIAGNOSIS — Z89511 Acquired absence of right leg below knee: Secondary | ICD-10-CM

## 2019-07-26 DIAGNOSIS — S88111A Complete traumatic amputation at level between knee and ankle, right lower leg, initial encounter: Secondary | ICD-10-CM
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
## 2019-08-11 ENCOUNTER — Ambulatory Visit (INDEPENDENT_AMBULATORY_CARE_PROVIDER_SITE_OTHER): Payer: Medicare Other | Admitting: Physical Therapy

## 2019-08-11 ENCOUNTER — Encounter: Payer: Self-pay | Admitting: Physical Therapy

## 2019-08-11 ENCOUNTER — Other Ambulatory Visit: Payer: Self-pay

## 2019-08-11 DIAGNOSIS — G8929 Other chronic pain: Secondary | ICD-10-CM

## 2019-08-11 DIAGNOSIS — R293 Abnormal posture: Secondary | ICD-10-CM | POA: Diagnosis not present

## 2019-08-11 DIAGNOSIS — M6281 Muscle weakness (generalized): Secondary | ICD-10-CM

## 2019-08-11 DIAGNOSIS — R296 Repeated falls: Secondary | ICD-10-CM

## 2019-08-11 DIAGNOSIS — R2681 Unsteadiness on feet: Secondary | ICD-10-CM | POA: Diagnosis not present

## 2019-08-11 DIAGNOSIS — M25561 Pain in right knee: Secondary | ICD-10-CM

## 2019-08-11 DIAGNOSIS — R2689 Other abnormalities of gait and mobility: Secondary | ICD-10-CM

## 2019-08-11 DIAGNOSIS — M545 Low back pain, unspecified: Secondary | ICD-10-CM
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
## 2019-08-17 ENCOUNTER — Encounter: Payer: Medicare Other | Admitting: Physical Therapy

## 2019-09-06 ENCOUNTER — Encounter: Payer: Medicare Other | Admitting: Physical Therapy

## 2019-09-08 ENCOUNTER — Encounter: Payer: Medicare Other | Admitting: Physical Therapy

## 2019-09-13 ENCOUNTER — Encounter: Payer: Medicare Other | Admitting: Physical Therapy

## 2019-09-15 ENCOUNTER — Encounter: Payer: Medicare Other | Admitting: Physical Therapy

## 2019-09-20 ENCOUNTER — Other Ambulatory Visit: Payer: Self-pay

## 2019-09-20 ENCOUNTER — Ambulatory Visit (INDEPENDENT_AMBULATORY_CARE_PROVIDER_SITE_OTHER): Payer: Medicare Other | Admitting: Physical Therapy

## 2019-09-20 ENCOUNTER — Encounter: Payer: Self-pay | Admitting: Physical Therapy

## 2019-09-20 DIAGNOSIS — R293 Abnormal posture: Secondary | ICD-10-CM

## 2019-09-20 DIAGNOSIS — R2681 Unsteadiness on feet: Secondary | ICD-10-CM

## 2019-09-20 DIAGNOSIS — M25561 Pain in right knee: Secondary | ICD-10-CM

## 2019-09-20 DIAGNOSIS — M6281 Muscle weakness (generalized): Secondary | ICD-10-CM | POA: Diagnosis not present

## 2019-09-20 DIAGNOSIS — R2689 Other abnormalities of gait and mobility: Secondary | ICD-10-CM | POA: Diagnosis not present

## 2019-09-20 DIAGNOSIS — G8929 Other chronic pain: Secondary | ICD-10-CM

## 2019-09-20 DIAGNOSIS — R296 Repeated falls: Secondary | ICD-10-CM
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
## 2019-09-22 ENCOUNTER — Encounter: Payer: Medicare Other | Admitting: Physical Therapy

## 2019-09-26 ENCOUNTER — Telehealth: Payer: Self-pay | Admitting: *Deleted

## 2019-09-26 NOTE — Telephone Encounter (Signed)
CALLED PATIENT'S WIFE- [name] TO INFORM THAT PET HASN'T BEEN PRE-AUTH AND THEREFORE CANNOT BE SCHEDULED UNTIL PRE- AUTH IS OBTAINED, AFTER PET IS DONE THEN I WILL RESCHEDULE FU APPT. PATIENT'S WIFE VERIFIED UNDERSTANDING THIS

## 2019-09-27 ENCOUNTER — Ambulatory Visit: Payer: Medicare Other | Admitting: Radiation Oncology

## 2019-10-03 ENCOUNTER — Other Ambulatory Visit: Payer: Self-pay

## 2019-10-03 ENCOUNTER — Ambulatory Visit (INDEPENDENT_AMBULATORY_CARE_PROVIDER_SITE_OTHER): Payer: Medicare Other | Admitting: Physical Therapy

## 2019-10-03 ENCOUNTER — Encounter: Payer: Self-pay | Admitting: Physical Therapy

## 2019-10-03 DIAGNOSIS — M6281 Muscle weakness (generalized): Secondary | ICD-10-CM | POA: Diagnosis not present

## 2019-10-03 DIAGNOSIS — R2681 Unsteadiness on feet: Secondary | ICD-10-CM

## 2019-10-03 DIAGNOSIS — R296 Repeated falls: Secondary | ICD-10-CM

## 2019-10-03 DIAGNOSIS — R293 Abnormal posture: Secondary | ICD-10-CM

## 2019-10-03 DIAGNOSIS — R2689 Other abnormalities of gait and mobility: Secondary | ICD-10-CM | POA: Diagnosis not present

## 2019-10-03 DIAGNOSIS — M25561 Pain in right knee: Secondary | ICD-10-CM

## 2019-10-03 DIAGNOSIS — G8929 Other chronic pain: Secondary | ICD-10-CM
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
## 2019-10-04 ENCOUNTER — Ambulatory Visit (INDEPENDENT_AMBULATORY_CARE_PROVIDER_SITE_OTHER): Payer: Medicare Other | Admitting: Physical Therapy

## 2019-10-04 ENCOUNTER — Encounter: Payer: Self-pay | Admitting: Physical Therapy

## 2019-10-04 DIAGNOSIS — R296 Repeated falls: Secondary | ICD-10-CM

## 2019-10-04 DIAGNOSIS — M6281 Muscle weakness (generalized): Secondary | ICD-10-CM

## 2019-10-04 DIAGNOSIS — R2689 Other abnormalities of gait and mobility: Secondary | ICD-10-CM

## 2019-10-04 DIAGNOSIS — R2681 Unsteadiness on feet: Secondary | ICD-10-CM

## 2019-10-04 DIAGNOSIS — M25561 Pain in right knee: Secondary | ICD-10-CM

## 2019-10-04 DIAGNOSIS — R293 Abnormal posture: Secondary | ICD-10-CM | POA: Diagnosis not present

## 2019-10-04 DIAGNOSIS — M545 Low back pain, unspecified: Secondary | ICD-10-CM

## 2019-10-04 DIAGNOSIS — G8929 Other chronic pain: Secondary | ICD-10-CM
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
## 2019-10-06 ENCOUNTER — Encounter: Payer: Medicare Other | Admitting: Physical Therapy

## 2019-10-10 ENCOUNTER — Encounter: Payer: Self-pay | Admitting: Physical Therapy

## 2019-10-10 ENCOUNTER — Other Ambulatory Visit: Payer: Self-pay

## 2019-10-10 ENCOUNTER — Ambulatory Visit (INDEPENDENT_AMBULATORY_CARE_PROVIDER_SITE_OTHER): Payer: Medicare Other | Admitting: Physical Therapy

## 2019-10-10 DIAGNOSIS — M6281 Muscle weakness (generalized): Secondary | ICD-10-CM | POA: Diagnosis not present

## 2019-10-10 DIAGNOSIS — R2689 Other abnormalities of gait and mobility: Secondary | ICD-10-CM

## 2019-10-10 DIAGNOSIS — R293 Abnormal posture: Secondary | ICD-10-CM

## 2019-10-10 DIAGNOSIS — R296 Repeated falls: Secondary | ICD-10-CM

## 2019-10-10 DIAGNOSIS — R2681 Unsteadiness on feet: Secondary | ICD-10-CM | POA: Diagnosis not present

## 2019-10-10 DIAGNOSIS — M25561 Pain in right knee: Secondary | ICD-10-CM

## 2019-10-10 DIAGNOSIS — M545 Low back pain, unspecified: Secondary | ICD-10-CM

## 2019-10-10 DIAGNOSIS — G8929 Other chronic pain: Secondary | ICD-10-CM
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
## 2019-10-11 ENCOUNTER — Encounter: Payer: Medicare Other | Admitting: Physical Therapy

## 2019-10-13 ENCOUNTER — Encounter: Payer: Medicare Other | Admitting: Physical Therapy

## 2019-10-17 ENCOUNTER — Encounter: Payer: Self-pay | Admitting: Physical Therapy

## 2019-10-17 ENCOUNTER — Ambulatory Visit (INDEPENDENT_AMBULATORY_CARE_PROVIDER_SITE_OTHER): Payer: Medicare Other | Admitting: Physical Therapy

## 2019-10-17 ENCOUNTER — Telehealth: Payer: Self-pay | Admitting: *Deleted

## 2019-10-17 ENCOUNTER — Other Ambulatory Visit: Payer: Self-pay

## 2019-10-17 DIAGNOSIS — M545 Low back pain, unspecified: Secondary | ICD-10-CM

## 2019-10-17 DIAGNOSIS — M6281 Muscle weakness (generalized): Secondary | ICD-10-CM

## 2019-10-17 DIAGNOSIS — G8929 Other chronic pain: Secondary | ICD-10-CM

## 2019-10-17 DIAGNOSIS — R2681 Unsteadiness on feet: Secondary | ICD-10-CM | POA: Diagnosis not present

## 2019-10-17 DIAGNOSIS — R2689 Other abnormalities of gait and mobility: Secondary | ICD-10-CM

## 2019-10-17 DIAGNOSIS — R293 Abnormal posture: Secondary | ICD-10-CM

## 2019-10-17 DIAGNOSIS — M25561 Pain in right knee: Secondary | ICD-10-CM | POA: Diagnosis not present

## 2019-10-17 DIAGNOSIS — R296 Repeated falls: Secondary | ICD-10-CM

## 2019-10-17 NOTE — Telephone Encounter (Signed)
CALLED PATIENT TO INFORM OF PET SCAN FOR 10-26-19 - ARRIVAL TIME - 9:30 AM @ [facility], PATIENT TO BE NPO- 6 HRS. PRIOR TO TEST, PATIENT TO AVOID CARBS LAST MEAL PRIOR TO TEST, PATIENT TO RECEIVE RESULTS FROM DR. SQUIRE ON 10-28-19 @ 11:20 AM, SPOKE WITH PATIENT'S WIFE- [name] AND SHE IS AWARE OF THESE APPTS.
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
## 2019-10-18 ENCOUNTER — Encounter: Payer: Medicare Other | Admitting: Physical Therapy

## 2019-10-20 ENCOUNTER — Encounter: Payer: Medicare Other | Admitting: Physical Therapy

## 2019-10-24 ENCOUNTER — Encounter: Payer: Medicare Other | Admitting: Physical Therapy

## 2019-10-25 ENCOUNTER — Encounter: Payer: Medicare Other | Admitting: Physical Therapy

## 2019-10-26 ENCOUNTER — Other Ambulatory Visit: Payer: Self-pay

## 2019-10-26 ENCOUNTER — Encounter (HOSPITAL_COMMUNITY)
Admission: RE | Admit: 2019-10-26 | Discharge: 2019-10-26 | Disposition: A | Discharge status: Home / Self Care | Payer: Medicare Other | Source: Ambulatory Visit | Attending: Radiation Oncology | Admitting: Radiation Oncology

## 2019-10-26 DIAGNOSIS — C770 Secondary and unspecified malignant neoplasm of lymph nodes of head, face and neck: Secondary | ICD-10-CM | POA: Diagnosis present

## 2019-10-26 DIAGNOSIS — C3412 Malignant neoplasm of upper lobe, left bronchus or lung: Secondary | ICD-10-CM | POA: Diagnosis present

## 2019-10-26 DIAGNOSIS — C07 Malignant neoplasm of parotid gland: Secondary | ICD-10-CM | POA: Diagnosis present

## 2019-10-26 LAB — GLUCOSE, CAPILLARY: Glucose-Capillary: 129 mg/dL — ABNORMAL HIGH (ref 70–99)

## 2019-10-26 MED ORDER — FLUDEOXYGLUCOSE F - 18 (FDG) INJECTION
7.1700 | Freq: Once | INTRAVENOUS | Status: AC | PRN
  Administered 2019-10-26: 7.17 via INTRAVENOUS

## 2019-10-26 NOTE — Progress Notes (Signed)
[name] presents for follow up of radiation completed 06/23/2019 to his right parotid area and left lung.    Pain issues, if any: He reports occasional pain to the inside of his right ear.  Using a feeding tube?: No Weight changes, if any:  Wt Readings from Last 3 Encounters:  10/28/19 148 lb 12.8 oz (67.5 kg)  07/26/19 158 lb (71.7 kg)  06/08/19 158 lb (71.7 kg)   Swallowing issues, if any: He reports thick saliva and decreased appetite which has decreased his food intake. He tells me that he is drinking a gallon and a half of water daily.  Smoking or chewing tobacco? He is smoking 5 cigarettes daily, and marijuana daily for appetite stimulate.  Using fluoride trays daily? N/A Last ENT visit was on: He last saw Dr. Constance Holster on 05/26/19.  Other notable issues, if any:  He is here today to receive results of a PET scan completed on 10/26/19.    BP (!) 87/66 (BP Location: Right Arm)   Pulse 83   Temp 98.9 F (37.2 C) (Tympanic)   Resp 20   Wt 148 lb 12.8 oz (67.5 kg)   SpO2 97%   BMI 22.62 kg/m

## 2019-10-27 ENCOUNTER — Encounter: Payer: Medicare Other | Admitting: Physical Therapy

## 2019-10-28 ENCOUNTER — Other Ambulatory Visit: Payer: Self-pay

## 2019-10-28 ENCOUNTER — Ambulatory Visit
Admission: RE | Admit: 2019-10-28 | Discharge: 2019-10-28 | Disposition: A | Discharge status: Home / Self Care | Payer: Medicare Other | Source: Ambulatory Visit | Attending: Radiation Oncology | Admitting: Radiation Oncology

## 2019-10-28 ENCOUNTER — Encounter: Payer: Self-pay | Admitting: Radiation Oncology

## 2019-10-28 VITALS — BP 87/66 | HR 83 | Temp 98.9°F | Resp 20 | Wt 148.8 lb

## 2019-10-28 DIAGNOSIS — Z923 Personal history of irradiation: Secondary | ICD-10-CM | POA: Diagnosis not present

## 2019-10-28 DIAGNOSIS — Z7984 Long term (current) use of oral hypoglycemic drugs: Secondary | ICD-10-CM | POA: Insufficient documentation

## 2019-10-28 DIAGNOSIS — C3412 Malignant neoplasm of upper lobe, left bronchus or lung: Secondary | ICD-10-CM | POA: Diagnosis not present

## 2019-10-28 DIAGNOSIS — H9201 Otalgia, right ear: Secondary | ICD-10-CM | POA: Insufficient documentation

## 2019-10-28 DIAGNOSIS — C07 Malignant neoplasm of parotid gland: Secondary | ICD-10-CM | POA: Diagnosis not present

## 2019-10-28 DIAGNOSIS — Z79899 Other long term (current) drug therapy: Secondary | ICD-10-CM | POA: Insufficient documentation

## 2019-10-28 DIAGNOSIS — Z7982 Long term (current) use of aspirin: Secondary | ICD-10-CM | POA: Diagnosis not present

## 2019-10-28 DIAGNOSIS — C3490 Malignant neoplasm of unspecified part of unspecified bronchus or lung: Secondary | ICD-10-CM

## 2019-10-28 NOTE — Progress Notes (Addendum)
Radiation Oncology         [phone] ________________________________  Name: [name] MRN: [id number]  Date: 10/28/2019  DOB: 16-Oct-1955  Follow-Up note in person  CC: Patient, No Pcp Per  Nicholas Lose, MD  Diagnosis and Prior Radiotherapy:       ICD-10-CM   1. Metastatic non-small cell lung cancer ([place])  C34.90 Ambulatory referral to Oncology  2. Cancer of parotid gland ([place])  C07   3. Cancer of upper lobe of left lung ([place])  C34.12     05/10/2019 through 06/23/2019 Site Technique Total Dose (Gy) Dose per Fx (Gy) Completed Fx Beam Energies  Head & neck: HN_Rt_parotid IMRT 66/66 2 33/33 6X  [place]: Chest_L_lung IMRT 66/66 2 33/33 6X   CHIEF COMPLAINT:  Here for follow-up and surveillance of left lung and right periparotid cancer  Narrative:    The patient was seen today in my clinic with his significant other.  Overall he feels well but he does report some new headaches which have been present over the past few days.  He also reports left hip pain.  He notes a little bit of swelling (where his neck surgery was done at the scars) and some right ear pain.   Swallowing issues, if any: He reports thick saliva and decreased appetite which has decreased his food intake.  Smoking or chewing tobacco? He is smoking 5 cigarettes daily, and marijuana daily for appetite stimulant.  Using fluoride trays daily? N/A Last ENT visit was on: He last saw Dr. Constance Holster on 05/26/19.  Other notable issues, if any:  He is here today to receive results of a PET scan    ALLERGIES:  is allergic to adhesive [tape]; bactrim ds [sulfamethoxazole-trimethoprim]; and trental [pentoxifylline].  Meds: Current Outpatient Medications  Medication Sig Dispense Refill  . aspirin 81 MG EC tablet Take 1 tablet (81 mg total) by mouth daily. 30 tablet 3  . gabapentin (NEURONTIN) 300 MG capsule Take 1 capsule (300 mg total) by mouth 2 (two) times daily. 60 capsule 3  . glipiZIDE (GLUCOTROL) 5 MG tablet Take 2  tablets (10 mg total) by mouth daily before breakfast. (Patient taking differently: Take 5 mg by mouth daily before breakfast. ) 30 tablet 3  . sertraline (ZOLOFT) 25 MG tablet Take 1 tablet (25 mg total) by mouth daily. 90 tablet 0  . HYDROcodone-acetaminophen (NORCO/VICODIN) 5-325 MG tablet Take 1 tablet by mouth every 8 (eight) hours as needed for moderate pain.    [name] Kitchen LORazepam (ATIVAN) 0.5 MG tablet Take 1-2 tablets PO, PRN anxiety, 30 minutes before radiotherapy. (Patient not taking: Reported on 10/28/2019) 4 tablet 0   No current facility-administered medications for this encounter.    Physical Findings: The patient is in no acute distress. Patient is alert and oriented. Wt Readings from Last 3 Encounters:  10/28/19 148 lb 12.8 oz (67.5 kg)  07/26/19 158 lb (71.7 kg)  06/08/19 158 lb (71.7 kg)    weight is 148 lb 12.8 oz (67.5 kg). His tympanic temperature is 98.9 F (37.2 C). His blood pressure is 87/66 (abnormal) and his pulse is 83. His respiration is 20 and oxygen saturation is 97%. [name] Kitchen   He is sitting in a wheelchair in no acute distress Mild swelling at surgical scars of right neck. No palpable lymphadenopathy in the periauricular regions or the neck bilaterally. Prosthesis of lower extremity noted    Lab Findings: Lab Results  Component Value Date   WBC 7.3 05/30/2019   HGB  10.6 (L) 05/30/2019   HCT 32.8 (L) 05/30/2019   MCV 94.0 05/30/2019   PLT 162 05/30/2019    Lab Results  Component Value Date   TSH 1.039 07/08/2012    Radiographic Findings: I personally reviewed the images with the patient and his significant other NM PET Image Restag (PS) Skull Base To Thigh  Result Date: 10/26/2019 CLINICAL DATA:  Subsequent treatment strategy for head neck cancer and history of non-small cell lung cancer. EXAM: NUCLEAR MEDICINE PET SKULL BASE TO THIGH TECHNIQUE: 7.17 mCi F-18 FDG was injected intravenously. Full-ring PET imaging was performed from the skull base to thigh  after the radiotracer. CT data was obtained and used for attenuation correction and anatomic localization. Fasting blood glucose: 129 mg/dl COMPARISON:  02/01/2019 FINDINGS: Mediastinal blood pool activity: SUV max 2.73 Liver activity: SUV max NA NECK: No hypermetabolic lymph nodes in the neck. Area of hypermetabolism in the pre-auricular region is no longer visualized. No finding above blood pool in this location Incidental CT findings: Signs of carotid calcification bilaterally. CHEST: Hypermetabolic nodules in the chest with new areas of hypermetabolism in the right chest as compared to the previous study. (Image 75, series 3 and series 4): Spiculated area previously shown to be hypermetabolic measuring approximately 2.2 cm as compared to 2.1 cm (SUVmax = 6.2) previously 14.7. Bandlike areas extending towards the mediastinum in the peripheral left chest. Scattered areas of nodularity throughout the left chest have developed since the prior study, for instance on image 40 of series 9 and image 43 of series 9 6-7 mm nodules in the peripheral left chest in the lower lobe and in the left upper lobe. (Image 95, series 4): Right infrahilar nodule contiguous with bronchovascular structures measures 1.4 x 1.1 cm (SUVmax = 6.1) previously no hypermetabolic activity in this location. New juxta diaphragmatic nodule in the right lung base 1.4 x 1.2 cm (image 104, series 4) (SUVmax = 4.4) (Image 91, series 4) 7 mm nodule in the right upper lobe (SUVmax = 1.9) Mediastinal activity has resolved. Incidental CT findings: Calcified atheromatous plaque in the thoracic aorta. No signs of pericardial effusion. ABDOMEN/PELVIS: New intense FDG uptake in the left adrenal gland (image 121 of series 4 and series 3 with minimal thickening on the CT image, approximately 9 mm thickness (SUVmax = 5.8 right adrenal is normal.) Incidental CT findings: Signs of cholecystectomy and ventral hernia repair. Spleen, pancreas and kidneys with  unremarkable CT appearance. Urinary bladder moderately distended. SKELETON: Costovertebral activity to the left of the spine in the upper chest is associated with a small lytic focus in the posterolateral left T4 vertebral body and pedicle showing increased FDG uptake. This area was not present previously and corresponds with a maximum SUV of 3.5. New focus of intense FDG uptake in the right hemi sacrum along the right S2 neural foramen (image 175, series 4) (SUVmax = 5.2) asymmetry of soft tissue within the neural foramen at this level measuring 9 mm. Incidental CT findings: Spinal degenerative changes. IMPRESSION: Pre-auricular mass with resolution of FDG uptake in no measurable lesion at the site. Diminished FDG uptake in left upper lobe mass and resolution of mediastinal uptake with surrounding ground-glass in the left upper lobe potentially related to post treatment changes but with new areas of uptake in the right chest, adrenal gland and bones suspicious for metastatic disease as described. Uptake of uncertain significance about the right hip, increased since the prior exam though following the pattern of the joint capsule. Could relate  to worsening of inflammation in the setting of degenerative change or tendinopathy. Correlate with any clinical signs of worsening pain or signs of infection. Electronically Signed   By: Zetta Bills M.D.   On: 10/26/2019 16:03    Impression/Plan: History of parotid cancer and lung cancer; parotid cancer was previously treated with surgery and then radiation and lung cancer was treated definitively with radiation.  Patient was initially adamant about not pursuing systemic therapy.  While the areas that have been targeted with radiation show good local regional control, unfortunately he has new areas of hypermetabolic activity consistent with metastatic disease to the left adrenal gland, bilateral lungs, and possibly the skeleton  I will order an MRI of his brain due  to new headaches and new evidence of metastatic disease this early.  I suspect the metastatic disease is from his known lung cancer. I will also refer him back to medical oncology.  We talked about potential treatment options with systemic therapy.  He seems open minded about pursuing these.  I will see him back on a as needed basis and contact him with the results of his brain MRI.   On date of service, in total, I spent 25 minutes on this encounter.  Patient was seen in person.  _____________________________________   Eppie Gibson, MD

## 2019-10-31 ENCOUNTER — Telehealth: Payer: Self-pay | Admitting: Internal Medicine

## 2019-10-31 ENCOUNTER — Encounter: Payer: Medicare Other | Admitting: Physical Therapy

## 2019-10-31 ENCOUNTER — Other Ambulatory Visit: Payer: Self-pay | Admitting: Radiation Therapy

## 2019-10-31 DIAGNOSIS — C3490 Malignant neoplasm of unspecified part of unspecified bronchus or lung: Secondary | ICD-10-CM

## 2019-10-31 NOTE — Telephone Encounter (Signed)
Received a new pt referral from Dr. Isidore Moos for metastatic lung cancer. Pt has been scheduled to see Dr. Julien Nordmann on 4/5 at 2:15pm and labs at 1:45pm. Appt date and time has been given to the pt's wife.

## 2019-11-01 ENCOUNTER — Encounter: Payer: Medicare Other | Admitting: Physical Therapy

## 2019-11-03 ENCOUNTER — Other Ambulatory Visit: Payer: Self-pay | Admitting: Medical Oncology

## 2019-11-03 ENCOUNTER — Encounter: Payer: Medicare Other | Admitting: Physical Therapy

## 2019-11-03 DIAGNOSIS — R911 Solitary pulmonary nodule: Secondary | ICD-10-CM

## 2019-11-07 ENCOUNTER — Inpatient Hospital Stay: Payer: Medicare Other

## 2019-11-07 ENCOUNTER — Encounter: Payer: Self-pay | Admitting: Internal Medicine

## 2019-11-07 ENCOUNTER — Other Ambulatory Visit: Payer: Self-pay

## 2019-11-07 ENCOUNTER — Inpatient Hospital Stay: Payer: Medicare Other | Attending: Internal Medicine | Admitting: Internal Medicine

## 2019-11-07 VITALS — BP 98/64 | HR 81 | Temp 98.5°F | Resp 17 | Ht 68.0 in | Wt 140.5 lb

## 2019-11-07 DIAGNOSIS — Z7984 Long term (current) use of oral hypoglycemic drugs: Secondary | ICD-10-CM | POA: Insufficient documentation

## 2019-11-07 DIAGNOSIS — Z923 Personal history of irradiation: Secondary | ICD-10-CM | POA: Insufficient documentation

## 2019-11-07 DIAGNOSIS — C7951 Secondary malignant neoplasm of bone: Secondary | ICD-10-CM | POA: Diagnosis not present

## 2019-11-07 DIAGNOSIS — Z7189 Other specified counseling: Secondary | ICD-10-CM

## 2019-11-07 DIAGNOSIS — Z87891 Personal history of nicotine dependence: Secondary | ICD-10-CM

## 2019-11-07 DIAGNOSIS — C3492 Malignant neoplasm of unspecified part of left bronchus or lung: Secondary | ICD-10-CM

## 2019-11-07 DIAGNOSIS — Z79899 Other long term (current) drug therapy: Secondary | ICD-10-CM | POA: Diagnosis not present

## 2019-11-07 DIAGNOSIS — Z85818 Personal history of malignant neoplasm of other sites of lip, oral cavity, and pharynx: Secondary | ICD-10-CM | POA: Insufficient documentation

## 2019-11-07 DIAGNOSIS — Z5111 Encounter for antineoplastic chemotherapy: Secondary | ICD-10-CM

## 2019-11-07 DIAGNOSIS — C3412 Malignant neoplasm of upper lobe, left bronchus or lung: Secondary | ICD-10-CM | POA: Diagnosis not present

## 2019-11-07 DIAGNOSIS — E119 Type 2 diabetes mellitus without complications: Secondary | ICD-10-CM | POA: Insufficient documentation

## 2019-11-07 DIAGNOSIS — R911 Solitary pulmonary nodule: Secondary | ICD-10-CM

## 2019-11-07 DIAGNOSIS — C770 Secondary and unspecified malignant neoplasm of lymph nodes of head, face and neck: Secondary | ICD-10-CM

## 2019-11-07 LAB — CBC WITH DIFFERENTIAL (CANCER CENTER ONLY)
Abs Immature Granulocytes: 0.02 10*3/uL (ref 0.00–0.07)
Basophils Absolute: 0.1 10*3/uL (ref 0.0–0.1)
Basophils Relative: 1 %
Eosinophils Absolute: 0.3 10*3/uL (ref 0.0–0.5)
Eosinophils Relative: 4 %
HCT: 38.3 % — ABNORMAL LOW (ref 39.0–52.0)
Hemoglobin: 12.9 g/dL — ABNORMAL LOW (ref 13.0–17.0)
Immature Granulocytes: 0 %
Lymphocytes Relative: 16 %
Lymphs Abs: 1.1 10*3/uL (ref 0.7–4.0)
MCH: 31.3 pg (ref 26.0–34.0)
MCHC: 33.7 g/dL (ref 30.0–36.0)
MCV: 93 fL (ref 80.0–100.0)
Monocytes Absolute: 0.6 10*3/uL (ref 0.1–1.0)
Monocytes Relative: 8 %
Neutro Abs: 5.1 10*3/uL (ref 1.7–7.7)
Neutrophils Relative %: 71 %
Platelet Count: 301 10*3/uL (ref 150–400)
RBC: 4.12 MIL/uL — ABNORMAL LOW (ref 4.22–5.81)
RDW: 14.2 % (ref 11.5–15.5)
WBC Count: 7.3 10*3/uL (ref 4.0–10.5)
nRBC: 0 % (ref 0.0–0.2)

## 2019-11-07 LAB — CMP (CANCER CENTER ONLY)
ALT: <6 U/L (ref 0–44)
AST: <6 U/L — ABNORMAL LOW (ref 15–41)
Albumin: 3.3 g/dL — ABNORMAL LOW (ref 3.5–5.0)
Alkaline Phosphatase: 91 U/L (ref 38–126)
Anion gap: 8 (ref 5–15)
BUN: 7 mg/dL — ABNORMAL LOW (ref 8–23)
CO2: 29 mmol/L (ref 22–32)
Calcium: 9 mg/dL (ref 8.9–10.3)
Chloride: 102 mmol/L (ref 98–111)
Creatinine: 0.77 mg/dL (ref 0.61–1.24)
GFR, Est AFR Am: >60 mL/min (ref >60)
GFR, Estimated: >60 mL/min (ref >60)
Glucose, Bld: 180 mg/dL — ABNORMAL HIGH (ref 70–99)
Potassium: 3.5 mmol/L (ref 3.5–5.1)
Sodium: 139 mmol/L (ref 135–145)
Total Bilirubin: 0.4 mg/dL (ref 0.3–1.2)
Total Protein: 6.3 g/dL — ABNORMAL LOW (ref 6.5–8.1)
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
## 2019-11-07 NOTE — Patient Instructions (Signed)
Steps to Quit Smoking Smoking tobacco is the leading cause of preventable death. It can affect almost every organ in the body. Smoking puts you and people around you at risk for many serious, long-lasting (chronic) diseases. Quitting smoking can be hard, but it is one of the best things that you can do for your health. It is never too late to quit. How do I get ready to quit? When you decide to quit smoking, make a plan to help you succeed. Before you quit:  Pick a date to quit. Set a date within the next 2 weeks to give you time to prepare.  Write down the reasons why you are quitting. Keep this list in places where you will see it often.  Tell your family, friends, and co-workers that you are quitting. Their support is important.  Talk with your doctor about the choices that may help you quit.  Find out if your health insurance will pay for these treatments.  Know the people, places, things, and activities that make you want to smoke (triggers). Avoid them. What first steps can I take to quit smoking?  Throw away all cigarettes at home, at work, and in your car.  Throw away the things that you use when you smoke, such as ashtrays and lighters.  Clean your car. Make sure to empty the ashtray.  Clean your home, including curtains and carpets. What can I do to help me quit smoking? Talk with your doctor about taking medicines and seeing a counselor at the same time. You are more likely to succeed when you do both.  If you are pregnant or breastfeeding, talk with your doctor about counseling or other ways to quit smoking. Do not take medicine to help you quit smoking unless your doctor tells you to do so. To quit smoking: Quit right away  Quit smoking totally, instead of slowly cutting back on how much you smoke over a period of time.  Go to counseling. You are more likely to quit if you go to counseling sessions regularly. Take medicine You may take medicines to help you quit. Some  medicines need a prescription, and some you can buy over-the-counter. Some medicines may contain a drug called nicotine to replace the nicotine in cigarettes. Medicines may:  Help you to stop having the desire to smoke (cravings).  Help to stop the problems that come when you stop smoking (withdrawal symptoms). Your doctor may ask you to use:  Nicotine patches, gum, or lozenges.  Nicotine inhalers or sprays.  Non-nicotine medicine that is taken by mouth. Find resources Find resources and other ways to help you quit smoking and remain smoke-free after you quit. These resources are most helpful when you use them often. They include:  Online chats with a counselor.  Phone quitlines.  Printed self-help materials.  Support groups or group counseling.  Text messaging programs.  Mobile phone apps. Use apps on your mobile phone or tablet that can help you stick to your quit plan. There are many free apps for mobile phones and tablets as well as websites. Examples include Quit Guide from the CDC and smokefree.gov  What things can I do to make it easier to quit?   Talk to your family and friends. Ask them to support and encourage you.  Call a phone quitline (1-800-QUIT-NOW), reach out to support groups, or work with a counselor.  Ask people who smoke to not smoke around you.  Avoid places that make you want to smoke,   such as: ? Bars. ? Parties. ? Smoke-break areas at work.  Spend time with people who do not smoke.  Lower the stress in your life. Stress can make you want to smoke. Try these things to help your stress: ? Getting regular exercise. ? Doing deep-breathing exercises. ? Doing yoga. ? Meditating. ? Doing a body scan. To do this, close your eyes, focus on one area of your body at a time from head to toe. Notice which parts of your body are tense. Try to relax the muscles in those areas. How will I feel when I quit smoking? Day 1 to 3 weeks Within the first 24 hours,  you may start to have some problems that come from quitting tobacco. These problems are very bad 2-3 days after you quit, but they do not often last for more than 2-3 weeks. You may get these symptoms:  Mood swings.  Feeling restless, nervous, angry, or annoyed.  Trouble concentrating.  Dizziness.  Strong desire for high-sugar foods and nicotine.  Weight gain.  Trouble pooping (constipation).  Feeling like you may vomit (nausea).  Coughing or a sore throat.  Changes in how the medicines that you take for other issues work in your body.  Depression.  Trouble sleeping (insomnia). Week 3 and afterward After the first 2-3 weeks of quitting, you may start to notice more positive results, such as:  Better sense of smell and taste.  Less coughing and sore throat.  Slower heart rate.  Lower blood pressure.  Clearer skin.  Better breathing.  Fewer sick days. Quitting smoking can be hard. Do not give up if you fail the first time. Some people need to try a few times before they succeed. Do your best to stick to your quit plan, and talk with your doctor if you have any questions or concerns. Summary  Smoking tobacco is the leading cause of preventable death. Quitting smoking can be hard, but it is one of the best things that you can do for your health.  When you decide to quit smoking, make a plan to help you succeed.  Quit smoking right away, not slowly over a period of time.  When you start quitting, seek help from your doctor, family, or friends. This information is not intended to replace advice given to you by your health care provider. Make sure you discuss any questions you have with your health care provider. Document Revised: 04/15/2019 Document Reviewed: 10/09/2018 Elsevier Patient Education  2020 Elsevier Inc.  

## 2019-11-09 ENCOUNTER — Telehealth: Payer: Self-pay | Admitting: Internal Medicine

## 2019-11-09 NOTE — Telephone Encounter (Signed)
Scheduled per los. Called and spoke with patients wife. Confirmed appt

## 2019-11-09 NOTE — Telephone Encounter (Signed)
Patient will be out of town week of 4/19 as requested in los.  Scheduled for following week per wifes request

## 2019-11-10 ENCOUNTER — Other Ambulatory Visit: Payer: Self-pay

## 2019-11-10 ENCOUNTER — Ambulatory Visit
Admission: RE | Admit: 2019-11-10 | Discharge: 2019-11-10 | Disposition: A | Discharge status: Home / Self Care | Payer: Medicare Other | Source: Ambulatory Visit | Attending: Radiation Oncology | Admitting: Radiation Oncology

## 2019-11-10 DIAGNOSIS — C3490 Malignant neoplasm of unspecified part of unspecified bronchus or lung: Secondary | ICD-10-CM

## 2019-11-10 MED ORDER — GADOBENATE DIMEGLUMINE 529 MG/ML IV SOLN
13.0000 mL | Freq: Once | INTRAVENOUS | Status: AC | PRN
  Administered 2019-11-10: 13 mL via INTRAVENOUS

## 2019-11-16 ENCOUNTER — Telehealth: Payer: Self-pay | Admitting: Radiation Therapy

## 2019-11-16 NOTE — Telephone Encounter (Signed)
Per Dr. Pearlie Oyster request, I called to share the results of [name] recent brain MRI. I spoke with his wife, and she was very happy for the call. I shared that although it was an incomplete study, since he could not finish it, did not show any obvious evidence of metastatic disease and if he develops new severe headaches or neurologic issues, he should let his doctors know.   Mont Dutton R.T.(R)(T) Radiation Special Procedures Navigator

## 2019-11-28 ENCOUNTER — Encounter: Payer: Self-pay | Admitting: *Deleted

## 2019-11-28 NOTE — Progress Notes (Signed)
Oncology Nurse Navigator Documentation  Oncology Nurse Navigator Flowsheets 11/28/2019  Navigator Location [facility]  Referral Date to RadOnc/MedOnc -  Navigator Encounter Type Telephone/per Dr. Julien Nordmann, I called patient and spoke with his wife.  I asked if they were planning on coming to the appt on 4/29.  I was told they are coming.  I printed [facility] report and gave to Dr. Julien Nordmann.   Telephone Outgoing Call  Treatment Initiated Date -  Patient Visit Type -  Treatment Phase Pre-Tx/Tx Discussion  Barriers/Navigation Needs Coordination of Care;Education  Education Other  Interventions Coordination of Care;Education  Acuity Level 2-Minimal Needs (1-2 Barriers Identified)  Coordination of Care Other  Education Method Verbal  Time Spent with Patient 30
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
## 2019-12-01 ENCOUNTER — Other Ambulatory Visit: Payer: Self-pay

## 2019-12-01 ENCOUNTER — Encounter: Payer: Self-pay | Admitting: Physician Assistant

## 2019-12-01 ENCOUNTER — Inpatient Hospital Stay (HOSPITAL_BASED_OUTPATIENT_CLINIC_OR_DEPARTMENT_OTHER): Payer: Medicare Other | Admitting: Physician Assistant

## 2019-12-01 ENCOUNTER — Other Ambulatory Visit: Payer: Self-pay | Admitting: Physician Assistant

## 2019-12-01 ENCOUNTER — Inpatient Hospital Stay: Payer: Medicare Other

## 2019-12-01 VITALS — BP 102/73 | HR 88 | Temp 98.0°F | Resp 18 | Ht 68.0 in | Wt 148.8 lb

## 2019-12-01 DIAGNOSIS — C3492 Malignant neoplasm of unspecified part of left bronchus or lung: Secondary | ICD-10-CM

## 2019-12-01 DIAGNOSIS — Z7189 Other specified counseling: Secondary | ICD-10-CM

## 2019-12-01 DIAGNOSIS — C3412 Malignant neoplasm of upper lobe, left bronchus or lung: Secondary | ICD-10-CM | POA: Diagnosis not present

## 2019-12-01 LAB — CMP (CANCER CENTER ONLY)
ALT: <6 U/L (ref 0–44)
AST: 10 U/L — ABNORMAL LOW (ref 15–41)
Albumin: 3.1 g/dL — ABNORMAL LOW (ref 3.5–5.0)
Alkaline Phosphatase: 92 U/L (ref 38–126)
Anion gap: 6 (ref 5–15)
BUN: 6 mg/dL — ABNORMAL LOW (ref 8–23)
CO2: 30 mmol/L (ref 22–32)
Calcium: 8.6 mg/dL — ABNORMAL LOW (ref 8.9–10.3)
Chloride: 104 mmol/L (ref 98–111)
Creatinine: 0.7 mg/dL (ref 0.61–1.24)
GFR, Est AFR Am: >60 mL/min (ref >60)
GFR, Estimated: >60 mL/min (ref >60)
Glucose, Bld: 152 mg/dL — ABNORMAL HIGH (ref 70–99)
Potassium: 4.4 mmol/L (ref 3.5–5.1)
Sodium: 140 mmol/L (ref 135–145)
Total Bilirubin: 0.3 mg/dL (ref 0.3–1.2)
Total Protein: 5.7 g/dL — ABNORMAL LOW (ref 6.5–8.1)

## 2019-12-01 LAB — CBC WITH DIFFERENTIAL (CANCER CENTER ONLY)
Abs Immature Granulocytes: 0.03 10*3/uL (ref 0.00–0.07)
Basophils Absolute: 0.1 10*3/uL (ref 0.0–0.1)
Basophils Relative: 1 %
Eosinophils Absolute: 0.5 10*3/uL (ref 0.0–0.5)
Eosinophils Relative: 6 %
HCT: 34 % — ABNORMAL LOW (ref 39.0–52.0)
Hemoglobin: 11.3 g/dL — ABNORMAL LOW (ref 13.0–17.0)
Immature Granulocytes: 0 %
Lymphocytes Relative: 14 %
Lymphs Abs: 1.1 10*3/uL (ref 0.7–4.0)
MCH: 31.7 pg (ref 26.0–34.0)
MCHC: 33.2 g/dL (ref 30.0–36.0)
MCV: 95.2 fL (ref 80.0–100.0)
Monocytes Absolute: 0.6 10*3/uL (ref 0.1–1.0)
Monocytes Relative: 8 %
Neutro Abs: 5.3 10*3/uL (ref 1.7–7.7)
Neutrophils Relative %: 71 %
Platelet Count: 209 10*3/uL (ref 150–400)
RBC: 3.57 MIL/uL — ABNORMAL LOW (ref 4.22–5.81)
RDW: 14.8 % (ref 11.5–15.5)
WBC Count: 7.5 10*3/uL (ref 4.0–10.5)
nRBC: 0 % (ref 0.0–0.2)

## 2019-12-01 NOTE — Patient Instructions (Signed)
Summary:  -There are two main categories of lung cancer, they are named based on the size of the cancer cell. One is called Non-Small cell lung cancer. The other type is Small Cell Lung Cancer -The sample (biopsy) that they took of your tumor was consistent with a subtype of Non-small cell lung cancer called Adenocarcinoma. This is the most common type of lung cancer.  -We covered a lot of important information at your appointment today regarding what the treatment plan is moving forward. Here are the the main points that were discussed at your office visit with [name] today:  -The treatment that you will receive consists of two chemotherapy drugs, called Carboplatin and Alimta (also called Pemetrexed) and one immunotherapy drug called Keytruda (pembrolizumab).  -We are planning on starting your treatment next week on ___ but before your start your treatment, I would like you to attend a Chemotherapy Education Class. This involves having you sit down with one of our nurse educators. She will discuss with your one-on-one more details about your treatment as well as general information about resources here at the cancer center.  -Your treatment will be given once every 3 weeks. We will check your labs once a week for the first ~5 treatments just to make sure that important components of your blood are in an acceptable range -For the first 12 weeks, you will be on 3 medications. After 12 weeks the treatment goes to just two drugs.  -We will get a CT scan after 3 treatments to check on the progress of treatment -Without treatment, average life expectancy is 6 months vs. average 2 year with treatment -Side effects of treatment can include nausea, vomiting, liver dysfunction, kidney dysfunction, fatigue, etc.   Medications:  -I have sent a few important medication prescriptions to your pharmacy.  -Compazine was sent to your pharmacy. This medication is for nausea. You may take this every 6 hours as needed if you  feel nauseous.  -I have also sent a prescription for 1 mg of folic acid to your pharmacy. We need you to take 1 tablet every day.  -We will administer vitamin B12 every 9 weeks while you are here in the clinic. You have received your first dose today.   Follow up:  -Call [name] regardless with whatever you decide so we can make the appropriate arrangements for what you decide.   -If you need to reach [name] at any time, the main office number to the cancer center is [phone], when you call, ask to speak to either Cassie's or Dr. Worthy Flank nurse.

## 2019-12-08 ENCOUNTER — Other Ambulatory Visit: Payer: Self-pay | Admitting: Physician Assistant

## 2019-12-08 ENCOUNTER — Other Ambulatory Visit: Payer: Self-pay | Admitting: Internal Medicine

## 2019-12-08 ENCOUNTER — Telehealth: Payer: Self-pay | Admitting: Medical Oncology

## 2019-12-08 ENCOUNTER — Telehealth: Payer: Self-pay | Admitting: Internal Medicine

## 2019-12-08 DIAGNOSIS — C3492 Malignant neoplasm of unspecified part of left bronchus or lung: Secondary | ICD-10-CM

## 2019-12-08 DIAGNOSIS — Z5111 Encounter for antineoplastic chemotherapy: Secondary | ICD-10-CM

## 2019-12-08 DIAGNOSIS — Z7189 Other specified counseling: Secondary | ICD-10-CM

## 2019-12-08 MED ORDER — PROCHLORPERAZINE MALEATE 10 MG PO TABS
10.0000 mg | ORAL_TABLET | Freq: Four times a day (QID) | ORAL | 2 refills | Status: DC | PRN
Start: 2019-12-08 — End: 2022-01-28

## 2019-12-08 MED ORDER — FOLIC ACID 1 MG PO TABS: 1.0000 mg | ORAL_TABLET | Freq: Every day | ORAL | 2 refills | Status: DC

## 2019-12-08 NOTE — Telephone Encounter (Signed)
Done. Please arrange for the B12 injection and I will send Folic acid and compazine to his pharmacy

## 2019-12-08 NOTE — Telephone Encounter (Signed)
Pt wants to proceed with chemotherapy.

## 2019-12-08 NOTE — Progress Notes (Signed)
START ON PATHWAY REGIMEN - Non-Small Cell Lung     A cycle is every 21 days:     Pembrolizumab      Pemetrexed      Carboplatin   **Always confirm dose/schedule in your pharmacy ordering system**  Patient Characteristics: Stage IV Metastatic, Nonsquamous, Initial Chemotherapy/Immunotherapy, PS = 0, 1, ALK Rearrangement Negative and ROS1 Rearrangement Negative and NTRK Gene Fusion?Negative and RET Gene Fusion?Negative and EGFR Mutation Negative/Non?Sensitizing, PD-L1  Expression Positive 1-49% (TPS) / Negative / Not Tested / Awaiting Test Results and Immunotherapy Candidate Therapeutic Status: Stage IV Metastatic Histology: Nonsquamous Cell ROS1 Rearrangement Status: Negative Other Mutations/Biomarkers: No Other Actionable Mutations NTRK Gene Fusion Status: Negative PD-L1 Expression Status: Quantity Not Sufficient Chemotherapy/Immunotherapy LOT: Initial Chemotherapy/Immunotherapy Molecular Targeted Therapy: Not Appropriate MET Exon 14 Mutation Status: Negative RET Gene Fusion Status: Negative ALK Rearrangement Status: Negative EGFR Mutation Status: Negative/Wild Type BRAF [id number] Mutation Status: Negative ECOG Performance Status: 1 Biomarker Assessment Status Confirmation: All Genomic Markers Negative or Only MET+ or BRAF+ Immunotherapy Candidate Status: Candidate for Immunotherapy Intent of Therapy: Non-Curative / Palliative Intent, Discussed with Patient 

## 2019-12-08 NOTE — Telephone Encounter (Signed)
Scheduled appt for inj - wife aware. Still waiting on add on for chemo to be scheduled. Pt wife aware we will call her back

## 2019-12-08 NOTE — Telephone Encounter (Signed)
Scheduling - Called wife -instructed about picking up antiemetics, appts requested  for vit b 12 injection for tomorrow or Monday , appt for chemo class next week and chemo starting next Thursday. I told her to expect a call from a scheduler.

## 2019-12-09 ENCOUNTER — Ambulatory Visit: Payer: Medicare Other

## 2019-12-09 ENCOUNTER — Telehealth: Payer: Self-pay | Admitting: Internal Medicine

## 2019-12-09 NOTE — Telephone Encounter (Signed)
Scheduled appt per 5/6 sch message - pt wife is aware of treatment appt added

## 2019-12-09 NOTE — Progress Notes (Signed)
Pharmacist Chemotherapy Monitoring - Initial Assessment    Anticipated start date: 12/15/2019   Regimen:  . Are orders appropriate based on the patient's diagnosis, regimen, and cycle? Yes . Does the plan date match the patient's scheduled date? Yes . Is the sequencing of drugs appropriate? Yes . Are the premedications appropriate for the patient's regimen? Yes . Prior Authorization for treatment is: Approved o If applicable, is the correct biosimilar selected based on the patient's insurance? not applicable  Organ Function and Labs: [name] Kitchen Are dose adjustments needed based on the patient's renal function, hepatic function, or hematologic function? No . Are appropriate labs ordered prior to the start of patient's treatment? Yes - baseline TSH ordered  . Other organ system assessment, if indicated: N/A . The following baseline labs, if indicated, have been ordered: pembrolizumab: baseline TSH +/- T4  Dose Assessment: . Are the drug doses appropriate? Yes . Are the following correct: o Drug concentrations Yes o IV fluid compatible with drug Yes o Administration routes Yes o Timing of therapy Yes . If applicable, does the patient have documented access for treatment and/or plans for port-a-cath placement? Patient does not have a port-a-cath at this time . If applicable, have lifetime cumulative doses been properly documented and assessed? not applicable Lifetime Dose Tracking  No doses have been documented on this patient for the following tracked chemicals: Doxorubicin, Epirubicin, Idarubicin, Daunorubicin, Mitoxantrone, Bleomycin, Oxaliplatin, Carboplatin, Liposomal Doxorubicin  o   Toxicity Monitoring/Prevention: . The patient has the following take home antiemetics prescribed: Prochlorperazine . The patient has the following take home medications prescribed: B12 for pemetrexed and folic acid for pemetrexed . Medication allergies and previous infusion related reactions, if applicable,  have been reviewed and addressed. Yes . The patient's current medication list has been assessed for drug-drug interactions with their chemotherapy regimen. no significant drug-drug interactions were identified on review.  Order Review: . Are the treatment plan orders signed? Yes . Is the patient scheduled to see a provider prior to their treatment? Yes  I verify that I have reviewed each item in the above checklist and answered each question accordingly.  Britt Boozer 12/09/2019 2:02 PM

## 2019-12-12 ENCOUNTER — Ambulatory Visit: Payer: Medicare Other

## 2019-12-13 ENCOUNTER — Encounter: Payer: Self-pay | Admitting: Internal Medicine

## 2019-12-13 ENCOUNTER — Other Ambulatory Visit: Payer: Self-pay

## 2019-12-13 ENCOUNTER — Inpatient Hospital Stay: Payer: Medicare Other

## 2019-12-13 ENCOUNTER — Inpatient Hospital Stay: Payer: Medicare Other | Attending: Internal Medicine

## 2019-12-13 DIAGNOSIS — C7951 Secondary malignant neoplasm of bone: Secondary | ICD-10-CM | POA: Insufficient documentation

## 2019-12-13 DIAGNOSIS — R531 Weakness: Secondary | ICD-10-CM | POA: Diagnosis not present

## 2019-12-13 DIAGNOSIS — C3492 Malignant neoplasm of unspecified part of left bronchus or lung: Secondary | ICD-10-CM

## 2019-12-13 DIAGNOSIS — C7971 Secondary malignant neoplasm of right adrenal gland: Secondary | ICD-10-CM | POA: Diagnosis not present

## 2019-12-13 DIAGNOSIS — Z7984 Long term (current) use of oral hypoglycemic drugs: Secondary | ICD-10-CM | POA: Insufficient documentation

## 2019-12-13 DIAGNOSIS — Z5111 Encounter for antineoplastic chemotherapy: Secondary | ICD-10-CM | POA: Insufficient documentation

## 2019-12-13 DIAGNOSIS — F17210 Nicotine dependence, cigarettes, uncomplicated: Secondary | ICD-10-CM | POA: Insufficient documentation

## 2019-12-13 DIAGNOSIS — R5383 Other fatigue: Secondary | ICD-10-CM | POA: Diagnosis not present

## 2019-12-13 DIAGNOSIS — Z79899 Other long term (current) drug therapy: Secondary | ICD-10-CM | POA: Insufficient documentation

## 2019-12-13 DIAGNOSIS — E119 Type 2 diabetes mellitus without complications: Secondary | ICD-10-CM | POA: Diagnosis not present

## 2019-12-13 DIAGNOSIS — C3412 Malignant neoplasm of upper lobe, left bronchus or lung: Secondary | ICD-10-CM | POA: Diagnosis present

## 2019-12-13 MED ORDER — CYANOCOBALAMIN 1000 MCG/ML IJ SOLN
INTRAMUSCULAR | Status: AC
  Filled 2019-12-13: qty 1

## 2019-12-13 MED ORDER — CYANOCOBALAMIN 1000 MCG/ML IJ SOLN
1000.0000 ug | Freq: Once | INTRAMUSCULAR | Status: AC
  Administered 2019-12-13: 1000 ug via INTRAMUSCULAR

## 2019-12-13 NOTE — Progress Notes (Signed)
My card with information regarding grant was given to patient due to me being on a call with another patient. It has my contact name and number for any financial questions or concerns.
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
## 2019-12-14 ENCOUNTER — Other Ambulatory Visit: Payer: Self-pay | Admitting: *Deleted

## 2019-12-14 DIAGNOSIS — C3492 Malignant neoplasm of unspecified part of left bronchus or lung: Secondary | ICD-10-CM

## 2019-12-15 ENCOUNTER — Other Ambulatory Visit: Payer: Self-pay | Admitting: Physician Assistant

## 2019-12-15 ENCOUNTER — Other Ambulatory Visit: Payer: Self-pay

## 2019-12-15 ENCOUNTER — Inpatient Hospital Stay: Payer: Medicare Other

## 2019-12-15 ENCOUNTER — Inpatient Hospital Stay (HOSPITAL_BASED_OUTPATIENT_CLINIC_OR_DEPARTMENT_OTHER): Payer: Medicare Other | Admitting: Physician Assistant

## 2019-12-15 ENCOUNTER — Ambulatory Visit (HOSPITAL_BASED_OUTPATIENT_CLINIC_OR_DEPARTMENT_OTHER): Payer: Medicare Other | Admitting: Medical

## 2019-12-15 VITALS — BP 105/74 | HR 82 | Temp 98.0°F | Resp 17 | Ht 68.0 in | Wt 139.7 lb

## 2019-12-15 DIAGNOSIS — R634 Abnormal weight loss: Secondary | ICD-10-CM | POA: Diagnosis not present

## 2019-12-15 DIAGNOSIS — Z5112 Encounter for antineoplastic immunotherapy: Secondary | ICD-10-CM | POA: Diagnosis not present

## 2019-12-15 DIAGNOSIS — C3492 Malignant neoplasm of unspecified part of left bronchus or lung: Secondary | ICD-10-CM

## 2019-12-15 DIAGNOSIS — C3412 Malignant neoplasm of upper lobe, left bronchus or lung: Secondary | ICD-10-CM

## 2019-12-15 DIAGNOSIS — Z5111 Encounter for antineoplastic chemotherapy: Secondary | ICD-10-CM | POA: Diagnosis not present

## 2019-12-15 LAB — CBC WITH DIFFERENTIAL (CANCER CENTER ONLY)
Abs Immature Granulocytes: 0.04 10*3/uL (ref 0.00–0.07)
Basophils Absolute: 0.1 10*3/uL (ref 0.0–0.1)
Basophils Relative: 1 %
Eosinophils Absolute: 0.4 10*3/uL (ref 0.0–0.5)
Eosinophils Relative: 4 %
HCT: 39.8 % (ref 39.0–52.0)
Hemoglobin: 13.4 g/dL (ref 13.0–17.0)
Immature Granulocytes: 0 %
Lymphocytes Relative: 11 %
Lymphs Abs: 1.2 10*3/uL (ref 0.7–4.0)
MCH: 32.2 pg (ref 26.0–34.0)
MCHC: 33.7 g/dL (ref 30.0–36.0)
MCV: 95.7 fL (ref 80.0–100.0)
Monocytes Absolute: 0.7 10*3/uL (ref 0.1–1.0)
Monocytes Relative: 7 %
Neutro Abs: 7.9 10*3/uL — ABNORMAL HIGH (ref 1.7–7.7)
Neutrophils Relative %: 77 %
Platelet Count: 258 10*3/uL (ref 150–400)
RBC: 4.16 MIL/uL — ABNORMAL LOW (ref 4.22–5.81)
RDW: 14.8 % (ref 11.5–15.5)
WBC Count: 10.3 10*3/uL (ref 4.0–10.5)
nRBC: 0 % (ref 0.0–0.2)

## 2019-12-15 LAB — CMP (CANCER CENTER ONLY)
ALT: 7 U/L (ref 0–44)
AST: 10 U/L — ABNORMAL LOW (ref 15–41)
Albumin: 3.4 g/dL — ABNORMAL LOW (ref 3.5–5.0)
Alkaline Phosphatase: 102 U/L (ref 38–126)
Anion gap: 11 (ref 5–15)
BUN: 7 mg/dL — ABNORMAL LOW (ref 8–23)
CO2: 24 mmol/L (ref 22–32)
Calcium: 9.1 mg/dL (ref 8.9–10.3)
Chloride: 106 mmol/L (ref 98–111)
Creatinine: 0.72 mg/dL (ref 0.61–1.24)
GFR, Est AFR Am: >60 mL/min (ref >60)
GFR, Estimated: >60 mL/min (ref >60)
Glucose, Bld: 137 mg/dL — ABNORMAL HIGH (ref 70–99)
Potassium: 4.1 mmol/L (ref 3.5–5.1)
Sodium: 141 mmol/L (ref 135–145)
Total Bilirubin: 0.3 mg/dL (ref 0.3–1.2)
Total Protein: 6.5 g/dL (ref 6.5–8.1)

## 2019-12-15 LAB — TSH: TSH: 1.777 u[IU]/mL (ref 0.320–4.118)

## 2019-12-15 MED ORDER — PALONOSETRON HCL INJECTION 0.25 MG/5ML
0.2500 mg | Freq: Once | INTRAVENOUS | Status: AC
  Administered 2019-12-15: 0.25 mg via INTRAVENOUS

## 2019-12-15 MED ORDER — SODIUM CHLORIDE 0.9 % IV SOLN
476.5000 mg | Freq: Once | INTRAVENOUS | Status: AC
  Administered 2019-12-15: 480 mg via INTRAVENOUS
  Filled 2019-12-15: qty 48

## 2019-12-15 MED ORDER — LIDOCAINE-PRILOCAINE 2.5-2.5 % EX CREA: 1.0000 "application " | TOPICAL_CREAM | CUTANEOUS | 2 refills | Status: DC | PRN

## 2019-12-15 MED ORDER — SODIUM CHLORIDE 0.9 % IV SOLN
500.0000 mg/m2 | Freq: Once | INTRAVENOUS | Status: AC
  Administered 2019-12-15: 900 mg via INTRAVENOUS
  Filled 2019-12-15: qty 16

## 2019-12-15 MED ORDER — SODIUM CHLORIDE 0.9 % IV SOLN
Freq: Once | INTRAVENOUS | Status: AC
  Filled 2019-12-15: qty 250

## 2019-12-15 MED ORDER — SODIUM CHLORIDE 0.9 % IV SOLN
200.0000 mg | Freq: Once | INTRAVENOUS | Status: AC
  Administered 2019-12-15: 200 mg via INTRAVENOUS
  Filled 2019-12-15: qty 8

## 2019-12-15 MED ORDER — SODIUM CHLORIDE 0.9 % IV SOLN
10.0000 mg | Freq: Once | INTRAVENOUS | Status: AC
  Administered 2019-12-15: 10 mg via INTRAVENOUS
  Filled 2019-12-15: qty 10

## 2019-12-15 MED ORDER — PALONOSETRON HCL INJECTION 0.25 MG/5ML
INTRAVENOUS | Status: AC
  Filled 2019-12-15: qty 5

## 2019-12-15 MED ORDER — SODIUM CHLORIDE 0.9 % IV SOLN
150.0000 mg | Freq: Once | INTRAVENOUS | Status: AC
  Administered 2019-12-15: 150 mg via INTRAVENOUS
  Filled 2019-12-15: qty 150

## 2019-12-15 NOTE — Progress Notes (Signed)
1156: Pt reports severe pain at his IV site during Carboplatin infusion. He then reported feeling shaky and dizzy. Infusion paused. BP 112/81 HR 84 RR 20 Temp 98.0. Sandi Mealy PA called to bedside. Normal saline hung to gravity. IV infusing well. No swelling or redness noted.   1200 Pt reported that he is feeling better and denies pain. Normal saline running concurrently w/ carboplatin now. Warm compress on IV site.

## 2019-12-15 NOTE — Patient Instructions (Addendum)
[facility] Discharge Instructions for Patients Receiving Chemotherapy  Today you received the following chemotherapy agents Pembrolizumab Beryle Flock), Pemetrexed (Alimta) and Carboplatin  To help prevent nausea and vomiting after your treatment, we encourage you to take your nausea medication as directed.   If you develop nausea and vomiting that is not controlled by your nausea medication, call the clinic.   BELOW ARE SYMPTOMS THAT SHOULD BE REPORTED IMMEDIATELY:  *FEVER GREATER THAN 100.5 F  *CHILLS WITH OR WITHOUT FEVER  NAUSEA AND VOMITING THAT IS NOT CONTROLLED WITH YOUR NAUSEA MEDICATION  *UNUSUAL SHORTNESS OF BREATH  *UNUSUAL BRUISING OR BLEEDING  TENDERNESS IN MOUTH AND THROAT WITH OR WITHOUT PRESENCE OF ULCERS  *URINARY PROBLEMS  *BOWEL PROBLEMS  UNUSUAL RASH Items with * indicate a potential emergency and should be followed up as soon as possible.  Feel free to call the clinic should you have any questions or concerns. The clinic phone number is [phone].  Please show the [place] at check-in to the Emergency Department and triage nurse.  Pembrolizumab injection What is this medicine? PEMBROLIZUMAB (pem broe liz ue mab) is a monoclonal antibody. It is used to treat certain types of cancer. This medicine may be used for other purposes; ask your health care provider or pharmacist if you have questions. COMMON BRAND NAME(S): Keytruda What should I tell my health care provider before I take this medicine? They need to know if you have any of these conditions:  diabetes  immune system problems  inflammatory bowel disease  liver disease  lung or breathing disease  lupus  received or scheduled to receive an organ transplant or a stem-cell transplant that uses donor stem cells  an unusual or allergic reaction to pembrolizumab, other medicines, foods, dyes, or preservatives  pregnant or trying to get  pregnant  breast-feeding How should I use this medicine? This medicine is for infusion into a vein. It is given by a health care professional in a hospital or clinic setting. A special MedGuide will be given to you before each treatment. Be sure to read this information carefully each time. Talk to your pediatrician regarding the use of this medicine in children. While this drug may be prescribed for children as young as 6 months for selected conditions, precautions do apply. Overdosage: If you think you have taken too much of this medicine contact a poison control center or emergency room at once. NOTE: This medicine is only for you. Do not share this medicine with others. What if I miss a dose? It is important not to miss your dose. Call your doctor or health care professional if you are unable to keep an appointment. What may interact with this medicine? Interactions have not been studied. Give your health care provider a list of all the medicines, herbs, non-prescription drugs, or dietary supplements you use. Also tell them if you smoke, drink alcohol, or use illegal drugs. Some items may interact with your medicine. This list may not describe all possible interactions. Give your health care provider a list of all the medicines, herbs, non-prescription drugs, or dietary supplements you use. Also tell them if you smoke, drink alcohol, or use illegal drugs. Some items may interact with your medicine. What should I watch for while using this medicine? Your condition will be monitored carefully while you are receiving this medicine. You may need blood work done while you are taking this medicine. Do not become pregnant while taking this medicine or for 4 months  after stopping it. Women should inform their doctor if they wish to become pregnant or think they might be pregnant. There is a potential for serious side effects to an unborn child. Talk to your health care professional or pharmacist for  more information. Do not breast-feed an infant while taking this medicine or for 4 months after the last dose. What side effects may I notice from receiving this medicine? Side effects that you should report to your doctor or health care professional as soon as possible:  allergic reactions like skin rash, itching or hives, swelling of the face, lips, or tongue  bloody or black, tarry  breathing problems  changes in vision  chest pain  chills  confusion  constipation  cough  diarrhea  dizziness or feeling faint or lightheaded  fast or irregular heartbeat  fever  flushing  joint pain  low blood counts - this medicine may decrease the number of white blood cells, red blood cells and platelets. You may be at increased risk for infections and bleeding.  muscle pain  muscle weakness  pain, tingling, numbness in the hands or feet  persistent headache  redness, blistering, peeling or loosening of the skin, including inside the mouth  signs and symptoms of high blood sugar such as dizziness; dry mouth; dry skin; fruity breath; nausea; stomach pain; increased hunger or thirst; increased urination  signs and symptoms of kidney injury like trouble passing urine or change in the amount of urine  signs and symptoms of liver injury like dark urine, light-colored stools, loss of appetite, nausea, right upper belly pain, yellowing of the eyes or skin  sweating  swollen lymph nodes  weight loss Side effects that usually do not require medical attention (report to your doctor or health care professional if they continue or are bothersome):  decreased appetite  hair loss  muscle pain  tiredness This list may not describe all possible side effects. Call your doctor for medical advice about side effects. You may report side effects to FDA at 1-800-FDA-1088. Where should I keep my medicine? This drug is given in a hospital or clinic and will not be stored at home. NOTE:  This sheet is a summary. It may not cover all possible information. If you have questions about this medicine, talk to your doctor, pharmacist, or health care provider.  2020 Elsevier/Gold Standard (2019-05-27 18:07:58)  Pemetrexed injection What is this medicine? PEMETREXED (PEM e TREX ed) is a chemotherapy drug used to treat lung cancers like non-small cell lung cancer and mesothelioma. It may also be used to treat other cancers. This medicine may be used for other purposes; ask your health care provider or pharmacist if you have questions. COMMON BRAND NAME(S): Alimta What should I tell my health care provider before I take this medicine? They need to know if you have any of these conditions:  infection (especially a virus infection such as chickenpox, cold sores, or herpes)  kidney disease  low blood counts, like low white cell, platelet, or red cell counts  lung or breathing disease, like asthma  radiation therapy  an unusual or allergic reaction to pemetrexed, other medicines, foods, dyes, or preservative  pregnant or trying to get pregnant  breast-feeding How should I use this medicine? This drug is given as an infusion into a vein. It is administered in a hospital or clinic by a specially trained health care professional. Talk to your pediatrician regarding the use of this medicine in children. Special care may be  needed. Overdosage: If you think you have taken too much of this medicine contact a poison control center or emergency room at once. NOTE: This medicine is only for you. Do not share this medicine with others. What if I miss a dose? It is important not to miss your dose. Call your doctor or health care professional if you are unable to keep an appointment. What may interact with this medicine? This medicine may interact with the following medications:  Ibuprofen This list may not describe all possible interactions. Give your health care provider a list of all  the medicines, herbs, non-prescription drugs, or dietary supplements you use. Also tell them if you smoke, drink alcohol, or use illegal drugs. Some items may interact with your medicine. What should I watch for while using this medicine? Visit your doctor for checks on your progress. This drug may make you feel generally unwell. This is not uncommon, as chemotherapy can affect healthy cells as well as cancer cells. Report any side effects. Continue your course of treatment even though you feel ill unless your doctor tells you to stop. In some cases, you may be given additional medicines to help with side effects. Follow all directions for their use. Call your doctor or health care professional for advice if you get a fever, chills or sore throat, or other symptoms of a cold or flu. Do not treat yourself. This drug decreases your body's ability to fight infections. Try to avoid being around people who are sick. This medicine may increase your risk to bruise or bleed. Call your doctor or health care professional if you notice any unusual bleeding. Be careful brushing and flossing your teeth or using a toothpick because you may get an infection or bleed more easily. If you have any dental work done, tell your dentist you are receiving this medicine. Avoid taking products that contain aspirin, acetaminophen, ibuprofen, naproxen, or ketoprofen unless instructed by your doctor. These medicines may hide a fever. Call your doctor or health care professional if you get diarrhea or mouth sores. Do not treat yourself. To protect your kidneys, drink water or other fluids as directed while you are taking this medicine. Do not become pregnant while taking this medicine or for 6 months after stopping it. Women should inform their doctor if they wish to become pregnant or think they might be pregnant. Men should not father a child while taking this medicine and for 3 months after stopping it. This may interfere with the  ability to father a child. You should talk to your doctor or health care professional if you are concerned about your fertility. There is a potential for serious side effects to an unborn child. Talk to your health care professional or pharmacist for more information. Do not breast-feed an infant while taking this medicine or for 1 week after stopping it. What side effects may I notice from receiving this medicine? Side effects that you should report to your doctor or health care professional as soon as possible:  allergic reactions like skin rash, itching or hives, swelling of the face, lips, or tongue  breathing problems  redness, blistering, peeling or loosening of the skin, including inside the mouth  signs and symptoms of bleeding such as bloody or black, tarry stools; red or dark-brown urine; spitting up blood or brown material that looks like coffee grounds; red spots on the skin; unusual bruising or bleeding from the eye, gums, or nose  signs and symptoms of infection like fever  or chills; cough; sore throat; pain or trouble passing urine  signs and symptoms of kidney injury like trouble passing urine or change in the amount of urine  signs and symptoms of liver injury like dark yellow or brown urine; general ill feeling or flu-like symptoms; light-colored stools; loss of appetite; nausea; right upper belly pain; unusually weak or tired; yellowing of the eyes or skin Side effects that usually do not require medical attention (report to your doctor or health care professional if they continue or are bothersome):  constipation  mouth sores  nausea, vomiting  unusually weak or tired This list may not describe all possible side effects. Call your doctor for medical advice about side effects. You may report side effects to FDA at 1-800-FDA-1088. Where should I keep my medicine? This drug is given in a hospital or clinic and will not be stored at home. NOTE: This sheet is a summary. It  may not cover all possible information. If you have questions about this medicine, talk to your doctor, pharmacist, or health care provider.  2020 Elsevier/Gold Standard (2017-09-09 16:11:33)  Carboplatin injection What is this medicine? CARBOPLATIN (KAR boe pla tin) is a chemotherapy drug. It targets fast dividing cells, like cancer cells, and causes these cells to die. This medicine is used to treat ovarian cancer and many other cancers. This medicine may be used for other purposes; ask your health care provider or pharmacist if you have questions. COMMON BRAND NAME(S): Paraplatin What should I tell my health care provider before I take this medicine? They need to know if you have any of these conditions:  blood disorders  hearing problems  kidney disease  recent or ongoing radiation therapy  an unusual or allergic reaction to carboplatin, cisplatin, other chemotherapy, other medicines, foods, dyes, or preservatives  pregnant or trying to get pregnant  breast-feeding How should I use this medicine? This drug is usually given as an infusion into a vein. It is administered in a hospital or clinic by a specially trained health care professional. Talk to your pediatrician regarding the use of this medicine in children. Special care may be needed. Overdosage: If you think you have taken too much of this medicine contact a poison control center or emergency room at once. NOTE: This medicine is only for you. Do not share this medicine with others. What if I miss a dose? It is important not to miss a dose. Call your doctor or health care professional if you are unable to keep an appointment. What may interact with this medicine?  medicines for seizures  medicines to increase blood counts like filgrastim, pegfilgrastim, sargramostim  some antibiotics like amikacin, gentamicin, neomycin, streptomycin, tobramycin  vaccines Talk to your doctor or health care professional before taking  any of these medicines:  acetaminophen  aspirin  ibuprofen  ketoprofen  naproxen This list may not describe all possible interactions. Give your health care provider a list of all the medicines, herbs, non-prescription drugs, or dietary supplements you use. Also tell them if you smoke, drink alcohol, or use illegal drugs. Some items may interact with your medicine. What should I watch for while using this medicine? Your condition will be monitored carefully while you are receiving this medicine. You will need important blood work done while you are taking this medicine. This drug may make you feel generally unwell. This is not uncommon, as chemotherapy can affect healthy cells as well as cancer cells. Report any side effects. Continue your course of treatment  even though you feel ill unless your doctor tells you to stop. In some cases, you may be given additional medicines to help with side effects. Follow all directions for their use. Call your doctor or health care professional for advice if you get a fever, chills or sore throat, or other symptoms of a cold or flu. Do not treat yourself. This drug decreases your body's ability to fight infections. Try to avoid being around people who are sick. This medicine may increase your risk to bruise or bleed. Call your doctor or health care professional if you notice any unusual bleeding. Be careful brushing and flossing your teeth or using a toothpick because you may get an infection or bleed more easily. If you have any dental work done, tell your dentist you are receiving this medicine. Avoid taking products that contain aspirin, acetaminophen, ibuprofen, naproxen, or ketoprofen unless instructed by your doctor. These medicines may hide a fever. Do not become pregnant while taking this medicine. Women should inform their doctor if they wish to become pregnant or think they might be pregnant. There is a potential for serious side effects to an unborn  child. Talk to your health care professional or pharmacist for more information. Do not breast-feed an infant while taking this medicine. What side effects may I notice from receiving this medicine? Side effects that you should report to your doctor or health care professional as soon as possible:  allergic reactions like skin rash, itching or hives, swelling of the face, lips, or tongue  signs of infection - fever or chills, cough, sore throat, pain or difficulty passing urine  signs of decreased platelets or bleeding - bruising, pinpoint red spots on the skin, black, tarry stools, nosebleeds  signs of decreased red blood cells - unusually weak or tired, fainting spells, lightheadedness  breathing problems  changes in hearing  changes in vision  chest pain  high blood pressure  low blood counts - This drug may decrease the number of white blood cells, red blood cells and platelets. You may be at increased risk for infections and bleeding.  nausea and vomiting  pain, swelling, redness or irritation at the injection site  pain, tingling, numbness in the hands or feet  problems with balance, talking, walking  trouble passing urine or change in the amount of urine Side effects that usually do not require medical attention (report to your doctor or health care professional if they continue or are bothersome):  hair loss  loss of appetite  metallic taste in the mouth or changes in taste This list may not describe all possible side effects. Call your doctor for medical advice about side effects. You may report side effects to FDA at 1-800-FDA-1088. Where should I keep my medicine? This drug is given in a hospital or clinic and will not be stored at home. NOTE: This sheet is a summary. It may not cover all possible information. If you have questions about this medicine, talk to your doctor, pharmacist, or health care provider.  2020 Elsevier/Gold Standard (2007-10-26  14:38:05)

## 2019-12-16 ENCOUNTER — Telehealth: Payer: Self-pay | Admitting: *Deleted

## 2019-12-16 ENCOUNTER — Telehealth: Payer: Self-pay | Admitting: Physician Assistant

## 2019-12-16 ENCOUNTER — Other Ambulatory Visit: Payer: Self-pay | Admitting: Physician Assistant

## 2019-12-16 DIAGNOSIS — R12 Heartburn: Secondary | ICD-10-CM

## 2019-12-16 MED ORDER — OMEPRAZOLE 20 MG PO CPDR: 20.0000 mg | DELAYED_RELEASE_CAPSULE | Freq: Every day | ORAL | 1 refills | Status: DC

## 2019-12-16 NOTE — Telephone Encounter (Signed)
Chemo Call back taken care of by Desk RN.

## 2019-12-16 NOTE — Telephone Encounter (Signed)
Scheduled per los. Called and left msg. Mailed printout  °

## 2019-12-16 NOTE — Telephone Encounter (Signed)
-----   Message from Scot Dock, RN sent at 12/15/2019  1:35 PM EDT ----- Regarding: 1st time chemo follow up Dr. Julien Nordmann pt

## 2019-12-16 NOTE — Progress Notes (Signed)
The patient was seen in the infusion room today when he reported pain in his left posterior forearm at the site of his IV.  He had already received Keytruda and Alimta was currently receiving carboplatin when he reported pain.  The area was examined.  There was no evidence of infiltration.  The patient's IV was patent.  He he pack was placed to the area and the patient was restarted with carboplatin but was given concurrent normal saline at the time.  He reported no further issues with discomfort.  He would like to have a port placed.  Sandi Mealy, MHS, PA-C Physician Assistant

## 2019-12-16 NOTE — Telephone Encounter (Signed)
Received vm message from patient stating hje had his first treatment yesterday of Carbo/alimta and [place]. He is experiencing significant heartburn last and today. He is requesting a prescription for this  to be called in to his pharmacy  ([place] [facility])  Otherwise he feels pretty well.  Thank you

## 2019-12-22 ENCOUNTER — Inpatient Hospital Stay: Payer: Medicare Other | Admitting: Internal Medicine

## 2019-12-22 ENCOUNTER — Inpatient Hospital Stay (HOSPITAL_BASED_OUTPATIENT_CLINIC_OR_DEPARTMENT_OTHER): Payer: Medicare Other | Admitting: Physician Assistant

## 2019-12-22 ENCOUNTER — Encounter: Payer: Self-pay | Admitting: Physician Assistant

## 2019-12-22 ENCOUNTER — Inpatient Hospital Stay: Payer: Medicare Other

## 2019-12-22 ENCOUNTER — Other Ambulatory Visit: Payer: Self-pay

## 2019-12-22 ENCOUNTER — Other Ambulatory Visit: Payer: Self-pay | Admitting: Radiology

## 2019-12-22 VITALS — BP 109/75 | HR 84 | Temp 97.5°F | Resp 18 | Ht 68.0 in | Wt 145.4 lb

## 2019-12-22 DIAGNOSIS — C3492 Malignant neoplasm of unspecified part of left bronchus or lung: Secondary | ICD-10-CM | POA: Diagnosis not present

## 2019-12-22 DIAGNOSIS — R630 Anorexia: Secondary | ICD-10-CM

## 2019-12-22 DIAGNOSIS — Z5111 Encounter for antineoplastic chemotherapy: Secondary | ICD-10-CM | POA: Diagnosis not present

## 2019-12-22 LAB — CBC WITH DIFFERENTIAL (CANCER CENTER ONLY)
Abs Immature Granulocytes: 0.03 10*3/uL (ref 0.00–0.07)
Basophils Absolute: 0 10*3/uL (ref 0.0–0.1)
Basophils Relative: 0 %
Eosinophils Absolute: 0.5 10*3/uL (ref 0.0–0.5)
Eosinophils Relative: 8 %
HCT: 37.5 % — ABNORMAL LOW (ref 39.0–52.0)
Hemoglobin: 12.3 g/dL — ABNORMAL LOW (ref 13.0–17.0)
Immature Granulocytes: 1 %
Lymphocytes Relative: 21 %
Lymphs Abs: 1.2 10*3/uL (ref 0.7–4.0)
MCH: 31.6 pg (ref 26.0–34.0)
MCHC: 32.8 g/dL (ref 30.0–36.0)
MCV: 96.4 fL (ref 80.0–100.0)
Monocytes Absolute: 0.3 10*3/uL (ref 0.1–1.0)
Monocytes Relative: 5 %
Neutro Abs: 3.8 10*3/uL (ref 1.7–7.7)
Neutrophils Relative %: 65 %
Platelet Count: 155 10*3/uL (ref 150–400)
RBC: 3.89 MIL/uL — ABNORMAL LOW (ref 4.22–5.81)
RDW: 14.2 % (ref 11.5–15.5)
WBC Count: 5.8 10*3/uL (ref 4.0–10.5)
nRBC: 0 % (ref 0.0–0.2)

## 2019-12-22 LAB — CMP (CANCER CENTER ONLY)
ALT: 8 U/L (ref 0–44)
AST: 9 U/L — ABNORMAL LOW (ref 15–41)
Albumin: 3.5 g/dL (ref 3.5–5.0)
Alkaline Phosphatase: 90 U/L (ref 38–126)
Anion gap: 8 (ref 5–15)
BUN: 16 mg/dL (ref 8–23)
CO2: 30 mmol/L (ref 22–32)
Calcium: 9.2 mg/dL (ref 8.9–10.3)
Chloride: 100 mmol/L (ref 98–111)
Creatinine: 0.74 mg/dL (ref 0.61–1.24)
GFR, Est AFR Am: >60 mL/min (ref >60)
GFR, Estimated: >60 mL/min (ref >60)
Glucose, Bld: 179 mg/dL — ABNORMAL HIGH (ref 70–99)
Potassium: 4.1 mmol/L (ref 3.5–5.1)
Sodium: 138 mmol/L (ref 135–145)
Total Bilirubin: 0.3 mg/dL (ref 0.3–1.2)
Total Protein: 6.5 g/dL (ref 6.5–8.1)

## 2019-12-22 LAB — TSH: TSH: 1.683 u[IU]/mL (ref 0.320–4.118)

## 2019-12-22 MED ORDER — MEGESTROL ACETATE 625 MG/5ML PO SUSP: 625.0000 mg | Freq: Every day | ORAL | 0 refills | Status: DC
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
## 2019-12-23 ENCOUNTER — Ambulatory Visit (HOSPITAL_COMMUNITY)
Admission: RE | Admit: 2019-12-23 | Discharge: 2019-12-23 | Disposition: A | Discharge status: Home / Self Care | Payer: Medicare Other | Source: Ambulatory Visit | Attending: Physician Assistant | Admitting: Physician Assistant

## 2019-12-23 ENCOUNTER — Encounter (HOSPITAL_COMMUNITY): Payer: Self-pay

## 2019-12-23 ENCOUNTER — Ambulatory Visit (HOSPITAL_COMMUNITY)
Admission: RE | Admit: 2019-12-23 | Discharge: 2019-12-23 | Disposition: A | Discharge status: Home / Self Care | Payer: Medicare Other | Source: Ambulatory Visit | Attending: Internal Medicine | Admitting: Internal Medicine

## 2019-12-23 ENCOUNTER — Telehealth: Payer: Self-pay | Admitting: Physician Assistant

## 2019-12-23 ENCOUNTER — Other Ambulatory Visit: Payer: Self-pay | Admitting: Physician Assistant

## 2019-12-23 DIAGNOSIS — E1151 Type 2 diabetes mellitus with diabetic peripheral angiopathy without gangrene: Secondary | ICD-10-CM | POA: Diagnosis not present

## 2019-12-23 DIAGNOSIS — C3492 Malignant neoplasm of unspecified part of left bronchus or lung: Secondary | ICD-10-CM

## 2019-12-23 DIAGNOSIS — Z7984 Long term (current) use of oral hypoglycemic drugs: Secondary | ICD-10-CM | POA: Insufficient documentation

## 2019-12-23 DIAGNOSIS — Z7982 Long term (current) use of aspirin: Secondary | ICD-10-CM | POA: Insufficient documentation

## 2019-12-23 DIAGNOSIS — Z79899 Other long term (current) drug therapy: Secondary | ICD-10-CM | POA: Insufficient documentation

## 2019-12-23 DIAGNOSIS — C7951 Secondary malignant neoplasm of bone: Secondary | ICD-10-CM | POA: Insufficient documentation

## 2019-12-23 DIAGNOSIS — Z89411 Acquired absence of right great toe: Secondary | ICD-10-CM | POA: Diagnosis not present

## 2019-12-23 DIAGNOSIS — C07 Malignant neoplasm of parotid gland: Secondary | ICD-10-CM | POA: Insufficient documentation

## 2019-12-23 DIAGNOSIS — F17210 Nicotine dependence, cigarettes, uncomplicated: Secondary | ICD-10-CM | POA: Insufficient documentation

## 2019-12-23 DIAGNOSIS — C7802 Secondary malignant neoplasm of left lung: Secondary | ICD-10-CM | POA: Insufficient documentation

## 2019-12-23 HISTORY — PX: IR IMAGING GUIDED PORT INSERTION: IMG5740

## 2019-12-23 LAB — CBC WITH DIFFERENTIAL/PLATELET
Abs Immature Granulocytes: 0.02 10*3/uL (ref 0.00–0.07)
Basophils Absolute: 0 10*3/uL (ref 0.0–0.1)
Basophils Relative: 0 %
Eosinophils Absolute: 0.3 10*3/uL (ref 0.0–0.5)
Eosinophils Relative: 5 %
HCT: 35.7 % — ABNORMAL LOW (ref 39.0–52.0)
Hemoglobin: 12 g/dL — ABNORMAL LOW (ref 13.0–17.0)
Immature Granulocytes: 0 %
Lymphocytes Relative: 16 %
Lymphs Abs: 0.8 10*3/uL (ref 0.7–4.0)
MCH: 32.2 pg (ref 26.0–34.0)
MCHC: 33.6 g/dL (ref 30.0–36.0)
MCV: 95.7 fL (ref 80.0–100.0)
Monocytes Absolute: 0.4 10*3/uL (ref 0.1–1.0)
Monocytes Relative: 7 %
Neutro Abs: 3.7 10*3/uL (ref 1.7–7.7)
Neutrophils Relative %: 72 %
Platelets: 152 10*3/uL (ref 150–400)
RBC: 3.73 MIL/uL — ABNORMAL LOW (ref 4.22–5.81)
RDW: 13.9 % (ref 11.5–15.5)
WBC: 5.2 10*3/uL (ref 4.0–10.5)
nRBC: 0 % (ref 0.0–0.2)

## 2019-12-23 LAB — GLUCOSE, CAPILLARY: Glucose-Capillary: 145 mg/dL — ABNORMAL HIGH (ref 70–99)

## 2019-12-23 LAB — PROTIME-INR
INR: 0.9 (ref 0.8–1.2)
Prothrombin Time: 12.1 seconds (ref 11.4–15.2)

## 2019-12-23 MED ORDER — MIDAZOLAM HCL 2 MG/2ML IJ SOLN
INTRAMUSCULAR | Status: AC | PRN
  Administered 2019-12-23 (×3): 1 mg via INTRAVENOUS

## 2019-12-23 MED ORDER — LIDOCAINE-EPINEPHRINE (PF) 1 %-1:200000 IJ SOLN
INTRAMUSCULAR | Status: AC | PRN
  Administered 2019-12-23: 10 mL

## 2019-12-23 MED ORDER — HEPARIN SOD (PORK) LOCK FLUSH 100 UNIT/ML IV SOLN
INTRAVENOUS | Status: AC
  Filled 2019-12-23: qty 5

## 2019-12-23 MED ORDER — FENTANYL CITRATE (PF) 100 MCG/2ML IJ SOLN
INTRAMUSCULAR | Status: AC | PRN
  Administered 2019-12-23 (×2): 50 ug via INTRAVENOUS

## 2019-12-23 MED ORDER — FENTANYL CITRATE (PF) 100 MCG/2ML IJ SOLN
INTRAMUSCULAR | Status: AC
  Filled 2019-12-23: qty 2

## 2019-12-23 MED ORDER — SODIUM CHLORIDE 0.9 % IV SOLN: INTRAVENOUS | Status: DC

## 2019-12-23 MED ORDER — LIDOCAINE-EPINEPHRINE 1 %-1:100000 IJ SOLN
INTRAMUSCULAR | Status: AC
  Filled 2019-12-23: qty 1

## 2019-12-23 MED ORDER — MIDAZOLAM HCL 2 MG/2ML IJ SOLN
INTRAMUSCULAR | Status: AC
  Filled 2019-12-23: qty 4

## 2019-12-23 MED ORDER — CEFAZOLIN SODIUM-DEXTROSE 2-4 GM/100ML-% IV SOLN
INTRAVENOUS | Status: AC
  Administered 2019-12-23: 2 g via INTRAVENOUS
  Filled 2019-12-23: qty 100

## 2019-12-23 MED ORDER — CEFAZOLIN SODIUM-DEXTROSE 2-4 GM/100ML-% IV SOLN: 2.0000 g | INTRAVENOUS | Status: AC

## 2019-12-23 MED ORDER — HEPARIN SOD (PORK) LOCK FLUSH 100 UNIT/ML IV SOLN
INTRAVENOUS | Status: AC | PRN
  Administered 2019-12-23: 500 [IU] via INTRAVENOUS

## 2019-12-23 NOTE — Discharge Instructions (Signed)
Please call Interventional Radiology clinic [phone] with urgent needs or concerns. Call 911 for chest pain, shortness of breath or emergengy.  You may remove your dressing and shower tomorrow. Do not submerge in tub.  Keep site clean and dry. May replace dressing with bandaid as necessary.  DO NOT use EMLA cream for 2 weeks after port placement as this cream will remove surgical glue on your incision.   Moderate Conscious Sedation, Adult, Care After These instructions provide you with information about caring for yourself after your procedure. Your health care provider may also give you more specific instructions. Your treatment has been planned according to current medical practices, but problems sometimes occur. Call your health care provider if you have any problems or questions after your procedure. What can I expect after the procedure? After your procedure, it is common:  To feel sleepy for several hours.  To feel clumsy and have poor balance for several hours.  To have poor judgment for several hours.  To vomit if you eat too soon. Follow these instructions at home: For at least 24 hours after the procedure:  Do not: ? Participate in activities where you could fall or become injured. ? Drive. ? Use heavy machinery. ? Drink alcohol. ? Take sleeping pills or medicines that cause drowsiness. ? Make important decisions or sign legal documents. ? Take care of children on your own.  Rest. Eating and drinking  Follow the diet recommended by your health care provider.  If you vomit: ? Drink water, juice, or soup when you can drink without vomiting. ? Make sure you have little or no nausea before eating solid foods. General instructions  Have a responsible adult stay with you until you are awake and alert.  Take over-the-counter and prescription medicines only as told by your health care provider.  If you smoke, do not smoke without supervision.  Keep all follow-up  visits as told by your health care provider. This is important. Contact a health care provider if:  You keep feeling nauseous or you keep vomiting.  You feel light-headed.  You develop a rash.  You have a fever. Get help right away if:  You have trouble breathing. This information is not intended to replace advice given to you by your health care provider. Make sure you discuss any questions you have with your health care provider. Document Revised: 07/03/2017 Document Reviewed: 11/10/2015 Elsevier Patient Education  [place] Insertion, Care After This sheet gives you information about how to care for yourself after your procedure. Your health care provider may also give you more specific instructions. If you have problems or questions, contact your health care provider. What can I expect after the procedure? After the procedure, it is common to have:  Discomfort at the port insertion site.  Bruising on the skin over the port. This should improve over 3-4 days. Follow these instructions at home: [facility] care  After your port is placed, you will get a manufacturer's information card. The card has information about your port. Keep this card with you at all times.  Take care of the port as told by your health care provider. Ask your health care provider if you or a family member can get training for taking care of the port at home. A home health care nurse may also take care of the port.  Make sure to remember what type of port you have. Incision care   Follow instructions from your health  care provider about how to take care of your port insertion site. Make sure you: ? Wash your hands with soap and water before and after you change your bandage (dressing). If soap and water are not available, use hand sanitizer. ? Change your dressing as told by your health care provider. ? Leave stitches (sutures), skin glue, or adhesive strips in place. These skin  closures may need to stay in place for 2 weeks or longer. If adhesive strip edges start to loosen and curl up, you may trim the loose edges. Do not remove adhesive strips completely unless your health care provider tells you to do that.  Check your port insertion site every day for signs of infection. Check for: ? Redness, swelling, or pain. ? Fluid or blood. ? Warmth. ? Pus or a bad smell. Activity  Return to your normal activities as told by your health care provider. Ask your health care provider what activities are safe for you.  Do not lift anything that is heavier than 10 lb (4.5 kg), or the limit that you are told, until your health care provider says that it is safe. General instructions  Take over-the-counter and prescription medicines only as told by your health care provider.  Do not take baths, swim, or use a hot tub until your health care provider approves. Ask your health care provider if you may take showers. You may only be allowed to take sponge baths.  Do not drive for 24 hours if you were given a sedative during your procedure.  Wear a medical alert bracelet in case of an emergency. This will tell any health care providers that you have a port.  Keep all follow-up visits as told by your health care provider. This is important. Contact a health care provider if:  You cannot flush your port with saline as directed, or you cannot draw blood from the port.  You have a fever or chills.  You have redness, swelling, or pain around your port insertion site.  You have fluid or blood coming from your port insertion site.  Your port insertion site feels warm to the touch.  You have pus or a bad smell coming from the port insertion site. Get help right away if:  You have chest pain or shortness of breath.  You have bleeding from your port that you cannot control. Summary  Take care of the port as told by your health care provider. Keep the manufacturer's information  card with you at all times.  Change your dressing as told by your health care provider.  Contact a health care provider if you have a fever or chills or if you have redness, swelling, or pain around your port insertion site.  Keep all follow-up visits as told by your health care provider. This information is not intended to replace advice given to you by your health care provider. Make sure you discuss any questions you have with your health care provider. Document Revised: 02/16/2018 Document Reviewed: 02/16/2018 Elsevier Patient Education  [place].

## 2019-12-23 NOTE — Procedures (Signed)
Interventional Radiology Procedure Note  Procedure: RT IJ POWER PORT  Complications: None  Estimated Blood Loss: MIN  Findings: TIP SVCRA       

## 2019-12-23 NOTE — Telephone Encounter (Signed)
Scheduled appts per 5/20 los. Rescheduled appts per pt's spouses request for later appts. Spouse confirmed appt dates and times.
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
## 2019-12-28 ENCOUNTER — Telehealth: Payer: Self-pay | Admitting: Medical Oncology

## 2019-12-28 NOTE — Telephone Encounter (Signed)
Cannot afford Megace for appetite stimulation. Per Cassie, I told wife that Megace is all that can be safely prescribed for pt. due to drug interactions.   I sent message to Financial Assistance staff to see if pt qualifies for drug card at [facility] .   Pt coming tomorrow for labs. Wife will check in with them tomorrow.

## 2019-12-29 ENCOUNTER — Other Ambulatory Visit: Payer: Self-pay | Admitting: Medical Oncology

## 2019-12-29 ENCOUNTER — Inpatient Hospital Stay: Payer: Medicare Other

## 2019-12-29 ENCOUNTER — Other Ambulatory Visit: Payer: Medicare Other

## 2019-12-29 ENCOUNTER — Telehealth: Payer: Self-pay | Admitting: Medical Oncology

## 2019-12-29 ENCOUNTER — Telehealth: Payer: Self-pay | Admitting: General Practice

## 2019-12-29 DIAGNOSIS — Z5111 Encounter for antineoplastic chemotherapy: Secondary | ICD-10-CM | POA: Diagnosis not present

## 2019-12-29 DIAGNOSIS — R630 Anorexia: Secondary | ICD-10-CM

## 2019-12-29 DIAGNOSIS — C3492 Malignant neoplasm of unspecified part of left bronchus or lung: Secondary | ICD-10-CM

## 2019-12-29 LAB — CBC WITH DIFFERENTIAL (CANCER CENTER ONLY)
Abs Immature Granulocytes: 0.01 10*3/uL (ref 0.00–0.07)
Basophils Absolute: 0 10*3/uL (ref 0.0–0.1)
Basophils Relative: 0 %
Eosinophils Absolute: 0.3 10*3/uL (ref 0.0–0.5)
Eosinophils Relative: 7 %
HCT: 33.5 % — ABNORMAL LOW (ref 39.0–52.0)
Hemoglobin: 11.3 g/dL — ABNORMAL LOW (ref 13.0–17.0)
Immature Granulocytes: 0 %
Lymphocytes Relative: 18 %
Lymphs Abs: 0.9 10*3/uL (ref 0.7–4.0)
MCH: 32 pg (ref 26.0–34.0)
MCHC: 33.7 g/dL (ref 30.0–36.0)
MCV: 94.9 fL (ref 80.0–100.0)
Monocytes Absolute: 0.5 10*3/uL (ref 0.1–1.0)
Monocytes Relative: 11 %
Neutro Abs: 3.1 10*3/uL (ref 1.7–7.7)
Neutrophils Relative %: 64 %
Platelet Count: 96 10*3/uL — ABNORMAL LOW (ref 150–400)
RBC: 3.53 MIL/uL — ABNORMAL LOW (ref 4.22–5.81)
RDW: 13.9 % (ref 11.5–15.5)
WBC Count: 4.9 10*3/uL (ref 4.0–10.5)
nRBC: 0 % (ref 0.0–0.2)

## 2019-12-29 LAB — CMP (CANCER CENTER ONLY)
ALT: 9 U/L (ref 0–44)
AST: 16 U/L (ref 15–41)
Albumin: 3.5 g/dL (ref 3.5–5.0)
Alkaline Phosphatase: 78 U/L (ref 38–126)
Anion gap: 10 (ref 5–15)
BUN: 13 mg/dL (ref 8–23)
CO2: 28 mmol/L (ref 22–32)
Calcium: 8.8 mg/dL — ABNORMAL LOW (ref 8.9–10.3)
Chloride: 101 mmol/L (ref 98–111)
Creatinine: 0.71 mg/dL (ref 0.61–1.24)
GFR, Est AFR Am: >60 mL/min (ref >60)
GFR, Estimated: >60 mL/min (ref >60)
Glucose, Bld: 216 mg/dL — ABNORMAL HIGH (ref 70–99)
Potassium: 4.2 mmol/L (ref 3.5–5.1)
Sodium: 139 mmol/L (ref 135–145)
Total Bilirubin: 0.4 mg/dL (ref 0.3–1.2)
Total Protein: 6.4 g/dL — ABNORMAL LOW (ref 6.5–8.1)

## 2019-12-29 MED ORDER — MEGESTROL ACETATE 625 MG/5ML PO SUSP
625.0000 mg | Freq: Every day | ORAL | 0 refills | Status: DC
Start: 2019-12-29 — End: 2020-03-06

## 2019-12-29 NOTE — Progress Notes (Signed)
Pharmacist Chemotherapy Monitoring - Follow Up Assessment    I verify that I have reviewed each item in the below checklist:  . Regimen for the patient is scheduled for the appropriate day and plan matches scheduled date. [name] Kitchen Appropriate non-routine labs are ordered dependent on drug ordered. . If applicable, additional medications reviewed and ordered per protocol based on lifetime cumulative doses and/or treatment regimen.   Plan for follow-up and/or issues identified: No . I-vent associated with next due treatment: No . MD and/or nursing notified: No  Britt Boozer 12/29/2019 11:33 AM

## 2019-12-29 NOTE — Telephone Encounter (Signed)
Pt appt today at 1315.

## 2019-12-29 NOTE — Telephone Encounter (Signed)
[facility] CSW Progress Notes  Call to wife at request of Red Christians, Estate manager/land agent.  Per wife, family cannot afford cost of Megace as prescribed under terms of his Medicare policy.  Per wife, "insurance denied it", but cannot be more specific than that.  Financial Advocate asked CSW to determine if there are any financial assistance resources available to help w this cost.  To CSW knowledge, there are no resources available for medication assistance for Megace.  CSW did contact [facility] - per pharmacy, there is a generic version which may be available at lower cost.  Treatment team advised of possible options.  Edwyna Shell, LCSW Clinical Social Worker Phone:  [phone] Cell:  [phone]

## 2020-01-03 ENCOUNTER — Encounter: Payer: Self-pay | Admitting: Physical Therapy

## 2020-01-03 NOTE — Therapy (Signed)
[facility] Physical Therapy [address] [place], [place], [address] Phone: [phone]   Fax:  [phone]  Patient Details  Name: [name] MRN: [id number] Date of Birth: Dec 26, 1955 Referring Provider:  Bevely Palmer Persons, [place]  Encounter Date: 01/03/2020  PHYSICAL THERAPY DISCHARGE SUMMARY  Visits from Start of Care: 6  08/11/2019 to 10/17/2019  Current functional level related to goals / functional outcomes: Patient did not return for scheduled appointments so unable to check long term goals.  PT Short Term Goals - 10/17/19 1737      PT SHORT TERM GOAL #1   Title  Patient verbalizes and demonstrates understanding of initial HEP. (all STGs target date: 10/13/2019)    Baseline  MET 10/17/2019    Time  4    Period  Weeks    Status  Achieved    Target Date  10/13/19      PT SHORT TERM GOAL #2   Title  Patient demonstrates proper donning of prosthesis with supervision only & verbalizes proper cleaning.    Baseline  MET 10/17/2019    Time  4    Period  Weeks    Status  Achieved    Target Date  10/13/19      PT SHORT TERM GOAL #3   Title  Patient tolerates wearing prosthesis >10 hrs total / day without skin integrity issues.    Baseline  MET 10/17/2019    Time  4    Period  Weeks    Status  Achieved    Target Date  10/13/19      PT SHORT TERM GOAL #4   Title  Standing balance with prosthesis: static without UE support with 30 seconds first attempt and reaches 10" with UE support safely.    Baseline  MET 10/17/2019    Time  4    Period  Weeks    Status  Achieved    Target Date  10/13/19      PT SHORT TERM GOAL #5   Title  Patient ambulates 100' with RW & prosthesis with supervision.    Baseline  MET 10/17/2019    Time  4    Period  Weeks    Status  Achieved    Target Date  10/13/19      PT SHORT TERM GOAL #6   Title  Patient negotiates ramps & curbs with RW & prosthesis with minA and verbal cues.    Baseline  MET 10/17/2019    Time  4    Period   Weeks    Status  Achieved    Target Date  10/13/19         Remaining deficits: Patient was dependent on rollator walker for gait. He has history of low back pain & high fall risk requiring BUE support for safe prosthetic gait.    Education / Equipment: Prosthetic care & HEP  Plan: Patient agrees to discharge.  Patient goals were not met. Patient is being discharged due to the patient's request.  ?????         Jamey Reas PT, DPT 01/03/2020, 10:50 AM  [facility] [address] [place], [place], [address] Phone: [phone]   Fax:  [phone]

## 2020-01-05 ENCOUNTER — Inpatient Hospital Stay: Payer: Medicare Other

## 2020-01-05 ENCOUNTER — Inpatient Hospital Stay: Payer: Medicare Other | Attending: Internal Medicine | Admitting: Internal Medicine

## 2020-01-05 ENCOUNTER — Encounter: Payer: Self-pay | Admitting: Internal Medicine

## 2020-01-05 ENCOUNTER — Other Ambulatory Visit: Payer: Medicare Other

## 2020-01-05 ENCOUNTER — Other Ambulatory Visit: Payer: Self-pay

## 2020-01-05 VITALS — BP 104/75 | HR 66 | Temp 97.5°F | Resp 16 | Ht 68.0 in | Wt 144.6 lb

## 2020-01-05 DIAGNOSIS — Z5111 Encounter for antineoplastic chemotherapy: Secondary | ICD-10-CM | POA: Diagnosis present

## 2020-01-05 DIAGNOSIS — Z5112 Encounter for antineoplastic immunotherapy: Secondary | ICD-10-CM | POA: Insufficient documentation

## 2020-01-05 DIAGNOSIS — C3492 Malignant neoplasm of unspecified part of left bronchus or lung: Secondary | ICD-10-CM

## 2020-01-05 DIAGNOSIS — C3412 Malignant neoplasm of upper lobe, left bronchus or lung: Secondary | ICD-10-CM

## 2020-01-05 DIAGNOSIS — Z79899 Other long term (current) drug therapy: Secondary | ICD-10-CM | POA: Diagnosis not present

## 2020-01-05 DIAGNOSIS — R918 Other nonspecific abnormal finding of lung field: Secondary | ICD-10-CM

## 2020-01-05 LAB — CMP (CANCER CENTER ONLY)
ALT: 7 U/L (ref 0–44)
AST: 9 U/L — ABNORMAL LOW (ref 15–41)
Albumin: 3.6 g/dL (ref 3.5–5.0)
Alkaline Phosphatase: 92 U/L (ref 38–126)
Anion gap: 9 (ref 5–15)
BUN: 9 mg/dL (ref 8–23)
CO2: 25 mmol/L (ref 22–32)
Calcium: 9.6 mg/dL (ref 8.9–10.3)
Chloride: 105 mmol/L (ref 98–111)
Creatinine: 0.81 mg/dL (ref 0.61–1.24)
GFR, Est AFR Am: >60 mL/min (ref >60)
GFR, Estimated: >60 mL/min (ref >60)
Glucose, Bld: 138 mg/dL — ABNORMAL HIGH (ref 70–99)
Potassium: 4.5 mmol/L (ref 3.5–5.1)
Sodium: 139 mmol/L (ref 135–145)
Total Bilirubin: <0.2 mg/dL — ABNORMAL LOW (ref 0.3–1.2)
Total Protein: 6.8 g/dL (ref 6.5–8.1)

## 2020-01-05 LAB — CBC WITH DIFFERENTIAL (CANCER CENTER ONLY)
Abs Immature Granulocytes: 0.09 10*3/uL — ABNORMAL HIGH (ref 0.00–0.07)
Basophils Absolute: 0.1 10*3/uL (ref 0.0–0.1)
Basophils Relative: 1 %
Eosinophils Absolute: 0.1 10*3/uL (ref 0.0–0.5)
Eosinophils Relative: 2 %
HCT: 37.3 % — ABNORMAL LOW (ref 39.0–52.0)
Hemoglobin: 12.4 g/dL — ABNORMAL LOW (ref 13.0–17.0)
Immature Granulocytes: 1 %
Lymphocytes Relative: 21 %
Lymphs Abs: 1.5 10*3/uL (ref 0.7–4.0)
MCH: 32.3 pg (ref 26.0–34.0)
MCHC: 33.2 g/dL (ref 30.0–36.0)
MCV: 97.1 fL (ref 80.0–100.0)
Monocytes Absolute: 0.9 10*3/uL (ref 0.1–1.0)
Monocytes Relative: 13 %
Neutro Abs: 4.3 10*3/uL (ref 1.7–7.7)
Neutrophils Relative %: 62 %
Platelet Count: 302 10*3/uL (ref 150–400)
RBC: 3.84 MIL/uL — ABNORMAL LOW (ref 4.22–5.81)
RDW: 15.1 % (ref 11.5–15.5)
WBC Count: 6.9 10*3/uL (ref 4.0–10.5)
nRBC: 0 % (ref 0.0–0.2)

## 2020-01-05 MED ORDER — SODIUM CHLORIDE 0.9 % IV SOLN
500.0000 mg/m2 | Freq: Once | INTRAVENOUS | Status: AC
  Administered 2020-01-05: 900 mg via INTRAVENOUS
  Filled 2020-01-05: qty 16

## 2020-01-05 MED ORDER — SODIUM CHLORIDE 0.9 % IV SOLN
150.0000 mg | Freq: Once | INTRAVENOUS | Status: AC
  Administered 2020-01-05: 150 mg via INTRAVENOUS
  Filled 2020-01-05: qty 150

## 2020-01-05 MED ORDER — SODIUM CHLORIDE 0.9 % IV SOLN
10.0000 mg | Freq: Once | INTRAVENOUS | Status: AC
  Administered 2020-01-05: 10 mg via INTRAVENOUS
  Filled 2020-01-05: qty 10

## 2020-01-05 MED ORDER — HEPARIN SOD (PORK) LOCK FLUSH 100 UNIT/ML IV SOLN
500.0000 [IU] | Freq: Once | INTRAVENOUS | Status: AC | PRN
  Administered 2020-01-05: 500 [IU]
  Filled 2020-01-05: qty 5

## 2020-01-05 MED ORDER — PALONOSETRON HCL INJECTION 0.25 MG/5ML
INTRAVENOUS | Status: AC
  Filled 2020-01-05: qty 5

## 2020-01-05 MED ORDER — SODIUM CHLORIDE 0.9 % IV SOLN
200.0000 mg | Freq: Once | INTRAVENOUS | Status: AC
  Administered 2020-01-05: 200 mg via INTRAVENOUS
  Filled 2020-01-05: qty 8

## 2020-01-05 MED ORDER — SODIUM CHLORIDE 0.9% FLUSH
10.0000 mL | INTRAVENOUS | Status: DC | PRN
  Administered 2020-01-05: 10 mL
  Filled 2020-01-05: qty 10

## 2020-01-05 MED ORDER — SODIUM CHLORIDE 0.9 % IV SOLN
Freq: Once | INTRAVENOUS | Status: AC
  Filled 2020-01-05: qty 250

## 2020-01-05 MED ORDER — SODIUM CHLORIDE 0.9% FLUSH
10.0000 mL | INTRAVENOUS | Status: DC | PRN
  Filled 2020-01-05: qty 10

## 2020-01-05 MED ORDER — PALONOSETRON HCL INJECTION 0.25 MG/5ML
0.2500 mg | Freq: Once | INTRAVENOUS | Status: AC
  Administered 2020-01-05: 0.25 mg via INTRAVENOUS

## 2020-01-05 MED ORDER — SODIUM CHLORIDE 0.9 % IV SOLN
476.5000 mg | Freq: Once | INTRAVENOUS | Status: AC
  Administered 2020-01-05: 480 mg via INTRAVENOUS
  Filled 2020-01-05: qty 48
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
## 2020-01-05 NOTE — Patient Instructions (Signed)
[facility] Discharge Instructions for Patients Receiving Chemotherapy  Today you received the following chemotherapy agents Keytruda, Alimta, and Carboplatin  To help prevent nausea and vomiting after your treatment, we encourage you to take your nausea medication as directed.  If you develop nausea and vomiting that is not controlled by your nausea medication, call the clinic.   BELOW ARE SYMPTOMS THAT SHOULD BE REPORTED IMMEDIATELY:  *FEVER GREATER THAN 100.5 F  *CHILLS WITH OR WITHOUT FEVER  NAUSEA AND VOMITING THAT IS NOT CONTROLLED WITH YOUR NAUSEA MEDICATION  *UNUSUAL SHORTNESS OF BREATH  *UNUSUAL BRUISING OR BLEEDING  TENDERNESS IN MOUTH AND THROAT WITH OR WITHOUT PRESENCE OF ULCERS  *URINARY PROBLEMS  *BOWEL PROBLEMS  UNUSUAL RASH Items with * indicate a potential emergency and should be followed up as soon as possible.  Feel free to call the clinic should you have any questions or concerns. The clinic phone number is [phone].  Please show the CHEMO ALERT CARD at check-in to the Emergency Department and triage nurse.   

## 2020-01-05 NOTE — Patient Instructions (Signed)
Steps to Quit Smoking Smoking tobacco is the leading cause of preventable death. It can affect almost every organ in the body. Smoking puts you and people around you at risk for many serious, long-lasting (chronic) diseases. Quitting smoking can be hard, but it is one of the best things that you can do for your health. It is never too late to quit. How do I get ready to quit? When you decide to quit smoking, make a plan to help you succeed. Before you quit:  Pick a date to quit. Set a date within the next 2 weeks to give you time to prepare.  Write down the reasons why you are quitting. Keep this list in places where you will see it often.  Tell your family, friends, and co-workers that you are quitting. Their support is important.  Talk with your doctor about the choices that may help you quit.  Find out if your health insurance will pay for these treatments.  Know the people, places, things, and activities that make you want to smoke (triggers). Avoid them. What first steps can I take to quit smoking?  Throw away all cigarettes at home, at work, and in your car.  Throw away the things that you use when you smoke, such as ashtrays and lighters.  Clean your car. Make sure to empty the ashtray.  Clean your home, including curtains and carpets. What can I do to help me quit smoking? Talk with your doctor about taking medicines and seeing a counselor at the same time. You are more likely to succeed when you do both.  If you are pregnant or breastfeeding, talk with your doctor about counseling or other ways to quit smoking. Do not take medicine to help you quit smoking unless your doctor tells you to do so. To quit smoking: Quit right away  Quit smoking totally, instead of slowly cutting back on how much you smoke over a period of time.  Go to counseling. You are more likely to quit if you go to counseling sessions regularly. Take medicine You may take medicines to help you quit. Some  medicines need a prescription, and some you can buy over-the-counter. Some medicines may contain a drug called nicotine to replace the nicotine in cigarettes. Medicines may:  Help you to stop having the desire to smoke (cravings).  Help to stop the problems that come when you stop smoking (withdrawal symptoms). Your doctor may ask you to use:  Nicotine patches, gum, or lozenges.  Nicotine inhalers or sprays.  Non-nicotine medicine that is taken by mouth. Find resources Find resources and other ways to help you quit smoking and remain smoke-free after you quit. These resources are most helpful when you use them often. They include:  Online chats with a counselor.  Phone quitlines.  Printed self-help materials.  Support groups or group counseling.  Text messaging programs.  Mobile phone apps. Use apps on your mobile phone or tablet that can help you stick to your quit plan. There are many free apps for mobile phones and tablets as well as websites. Examples include Quit Guide from the CDC and smokefree.gov  What things can I do to make it easier to quit?   Talk to your family and friends. Ask them to support and encourage you.  Call a phone quitline (1-800-QUIT-NOW), reach out to support groups, or work with a counselor.  Ask people who smoke to not smoke around you.  Avoid places that make you want to smoke,   such as: ? Bars. ? Parties. ? Smoke-break areas at work.  Spend time with people who do not smoke.  Lower the stress in your life. Stress can make you want to smoke. Try these things to help your stress: ? Getting regular exercise. ? Doing deep-breathing exercises. ? Doing yoga. ? Meditating. ? Doing a body scan. To do this, close your eyes, focus on one area of your body at a time from head to toe. Notice which parts of your body are tense. Try to relax the muscles in those areas. How will I feel when I quit smoking? Day 1 to 3 weeks Within the first 24 hours,  you may start to have some problems that come from quitting tobacco. These problems are very bad 2-3 days after you quit, but they do not often last for more than 2-3 weeks. You may get these symptoms:  Mood swings.  Feeling restless, nervous, angry, or annoyed.  Trouble concentrating.  Dizziness.  Strong desire for high-sugar foods and nicotine.  Weight gain.  Trouble pooping (constipation).  Feeling like you may vomit (nausea).  Coughing or a sore throat.  Changes in how the medicines that you take for other issues work in your body.  Depression.  Trouble sleeping (insomnia). Week 3 and afterward After the first 2-3 weeks of quitting, you may start to notice more positive results, such as:  Better sense of smell and taste.  Less coughing and sore throat.  Slower heart rate.  Lower blood pressure.  Clearer skin.  Better breathing.  Fewer sick days. Quitting smoking can be hard. Do not give up if you fail the first time. Some people need to try a few times before they succeed. Do your best to stick to your quit plan, and talk with your doctor if you have any questions or concerns. Summary  Smoking tobacco is the leading cause of preventable death. Quitting smoking can be hard, but it is one of the best things that you can do for your health.  When you decide to quit smoking, make a plan to help you succeed.  Quit smoking right away, not slowly over a period of time.  When you start quitting, seek help from your doctor, family, or friends. This information is not intended to replace advice given to you by your health care provider. Make sure you discuss any questions you have with your health care provider. Document Revised: 04/15/2019 Document Reviewed: 10/09/2018 Elsevier Patient Education  2020 Elsevier Inc.  

## 2020-01-12 ENCOUNTER — Other Ambulatory Visit: Payer: Self-pay

## 2020-01-12 ENCOUNTER — Inpatient Hospital Stay: Payer: Medicare Other

## 2020-01-12 ENCOUNTER — Other Ambulatory Visit: Payer: Medicare Other

## 2020-01-12 DIAGNOSIS — C3492 Malignant neoplasm of unspecified part of left bronchus or lung: Secondary | ICD-10-CM

## 2020-01-12 DIAGNOSIS — Z5112 Encounter for antineoplastic immunotherapy: Secondary | ICD-10-CM | POA: Diagnosis not present

## 2020-01-12 DIAGNOSIS — Z95828 Presence of other vascular implants and grafts: Secondary | ICD-10-CM | POA: Insufficient documentation

## 2020-01-12 LAB — CBC WITH DIFFERENTIAL (CANCER CENTER ONLY)
Abs Immature Granulocytes: 0.03 10*3/uL (ref 0.00–0.07)
Basophils Absolute: 0 10*3/uL (ref 0.0–0.1)
Basophils Relative: 0 %
Eosinophils Absolute: 0 10*3/uL (ref 0.0–0.5)
Eosinophils Relative: 1 %
HCT: 35.1 % — ABNORMAL LOW (ref 39.0–52.0)
Hemoglobin: 12 g/dL — ABNORMAL LOW (ref 13.0–17.0)
Immature Granulocytes: 1 %
Lymphocytes Relative: 21 %
Lymphs Abs: 1.1 10*3/uL (ref 0.7–4.0)
MCH: 32.2 pg (ref 26.0–34.0)
MCHC: 34.2 g/dL (ref 30.0–36.0)
MCV: 94.1 fL (ref 80.0–100.0)
Monocytes Absolute: 0.2 10*3/uL (ref 0.1–1.0)
Monocytes Relative: 5 %
Neutro Abs: 3.7 10*3/uL (ref 1.7–7.7)
Neutrophils Relative %: 72 %
Platelet Count: 156 10*3/uL (ref 150–400)
RBC: 3.73 MIL/uL — ABNORMAL LOW (ref 4.22–5.81)
RDW: 14.5 % (ref 11.5–15.5)
WBC Count: 5.1 10*3/uL (ref 4.0–10.5)
nRBC: 0 % (ref 0.0–0.2)

## 2020-01-12 LAB — CMP (CANCER CENTER ONLY)
ALT: 6 U/L (ref 0–44)
AST: 9 U/L — ABNORMAL LOW (ref 15–41)
Albumin: 3.5 g/dL (ref 3.5–5.0)
Alkaline Phosphatase: 75 U/L (ref 38–126)
Anion gap: 8 (ref 5–15)
BUN: 24 mg/dL — ABNORMAL HIGH (ref 8–23)
CO2: 24 mmol/L (ref 22–32)
Calcium: 9.1 mg/dL (ref 8.9–10.3)
Chloride: 104 mmol/L (ref 98–111)
Creatinine: 0.87 mg/dL (ref 0.61–1.24)
GFR, Est AFR Am: >60 mL/min (ref >60)
GFR, Estimated: >60 mL/min (ref >60)
Glucose, Bld: 179 mg/dL — ABNORMAL HIGH (ref 70–99)
Potassium: 4.3 mmol/L (ref 3.5–5.1)
Sodium: 136 mmol/L (ref 135–145)
Total Bilirubin: 0.3 mg/dL (ref 0.3–1.2)
Total Protein: 6.2 g/dL — ABNORMAL LOW (ref 6.5–8.1)

## 2020-01-12 LAB — TSH: TSH: 0.688 u[IU]/mL (ref 0.320–4.118)

## 2020-01-12 MED ORDER — HEPARIN SOD (PORK) LOCK FLUSH 100 UNIT/ML IV SOLN
500.0000 [IU] | Freq: Once | INTRAVENOUS | Status: AC
  Administered 2020-01-12: 500 [IU]
  Filled 2020-01-12: qty 5

## 2020-01-12 MED ORDER — SODIUM CHLORIDE 0.9% FLUSH
10.0000 mL | Freq: Once | INTRAVENOUS | Status: AC
  Administered 2020-01-12: 10 mL
  Filled 2020-01-12: qty 10

## 2020-01-17 LAB — GUARDANT 360

## 2020-01-18 ENCOUNTER — Telehealth: Payer: Self-pay | Admitting: Medical Oncology

## 2020-01-18 NOTE — Telephone Encounter (Signed)
OK 

## 2020-01-18 NOTE — Telephone Encounter (Addendum)
No Energy   "asoluteluy exhausted.He can go from bedroom to living room and is totally exhausted".   The megace is helping his appetite so he is eating.   6/3-Cycle #2-Keytruda ,alimta and carboplatin. Labs tomorrow.

## 2020-01-19 ENCOUNTER — Inpatient Hospital Stay: Payer: Medicare Other

## 2020-01-19 ENCOUNTER — Other Ambulatory Visit: Payer: Medicare Other

## 2020-01-19 ENCOUNTER — Other Ambulatory Visit: Payer: Self-pay

## 2020-01-19 DIAGNOSIS — Z5112 Encounter for antineoplastic immunotherapy: Secondary | ICD-10-CM | POA: Diagnosis not present

## 2020-01-19 DIAGNOSIS — C3492 Malignant neoplasm of unspecified part of left bronchus or lung: Secondary | ICD-10-CM

## 2020-01-19 LAB — CMP (CANCER CENTER ONLY)
ALT: 10 U/L (ref 0–44)
AST: 11 U/L — ABNORMAL LOW (ref 15–41)
Albumin: 3.5 g/dL (ref 3.5–5.0)
Alkaline Phosphatase: 79 U/L (ref 38–126)
Anion gap: 9 (ref 5–15)
BUN: 10 mg/dL (ref 8–23)
CO2: 25 mmol/L (ref 22–32)
Calcium: 8.9 mg/dL (ref 8.9–10.3)
Chloride: 103 mmol/L (ref 98–111)
Creatinine: 0.77 mg/dL (ref 0.61–1.24)
GFR, Est AFR Am: >60 mL/min (ref >60)
GFR, Estimated: >60 mL/min (ref >60)
Glucose, Bld: 184 mg/dL — ABNORMAL HIGH (ref 70–99)
Potassium: 4.7 mmol/L (ref 3.5–5.1)
Sodium: 137 mmol/L (ref 135–145)
Total Bilirubin: <0.2 mg/dL — ABNORMAL LOW (ref 0.3–1.2)
Total Protein: 6 g/dL — ABNORMAL LOW (ref 6.5–8.1)

## 2020-01-19 LAB — CBC WITH DIFFERENTIAL (CANCER CENTER ONLY)
Abs Immature Granulocytes: 0.05 10*3/uL (ref 0.00–0.07)
Basophils Absolute: 0 10*3/uL (ref 0.0–0.1)
Basophils Relative: 0 %
Eosinophils Absolute: 0.1 10*3/uL (ref 0.0–0.5)
Eosinophils Relative: 1 %
HCT: 32 % — ABNORMAL LOW (ref 39.0–52.0)
Hemoglobin: 11.1 g/dL — ABNORMAL LOW (ref 13.0–17.0)
Immature Granulocytes: 1 %
Lymphocytes Relative: 25 %
Lymphs Abs: 1.6 10*3/uL (ref 0.7–4.0)
MCH: 32.7 pg (ref 26.0–34.0)
MCHC: 34.7 g/dL (ref 30.0–36.0)
MCV: 94.4 fL (ref 80.0–100.0)
Monocytes Absolute: 0.9 10*3/uL (ref 0.1–1.0)
Monocytes Relative: 13 %
Neutro Abs: 3.9 10*3/uL (ref 1.7–7.7)
Neutrophils Relative %: 60 %
Platelet Count: 136 10*3/uL — ABNORMAL LOW (ref 150–400)
RBC: 3.39 MIL/uL — ABNORMAL LOW (ref 4.22–5.81)
RDW: 15.3 % (ref 11.5–15.5)
WBC Count: 6.4 10*3/uL (ref 4.0–10.5)
nRBC: 0 % (ref 0.0–0.2)

## 2020-01-20 NOTE — Telephone Encounter (Signed)
Pt notified that his labs are good . She said pt feels okay to day.
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
## 2020-01-26 ENCOUNTER — Inpatient Hospital Stay: Payer: Medicare Other

## 2020-01-26 ENCOUNTER — Ambulatory Visit: Payer: Medicare Other | Admitting: Physician Assistant

## 2020-01-26 ENCOUNTER — Ambulatory Visit: Payer: Medicare Other

## 2020-01-26 ENCOUNTER — Inpatient Hospital Stay (HOSPITAL_BASED_OUTPATIENT_CLINIC_OR_DEPARTMENT_OTHER): Payer: Medicare Other | Admitting: Physician Assistant

## 2020-01-26 ENCOUNTER — Other Ambulatory Visit: Payer: Medicare Other

## 2020-01-26 ENCOUNTER — Other Ambulatory Visit: Payer: Self-pay

## 2020-01-26 VITALS — BP 103/71 | HR 78 | Temp 97.7°F | Resp 18 | Ht 68.0 in | Wt 150.4 lb

## 2020-01-26 DIAGNOSIS — C3492 Malignant neoplasm of unspecified part of left bronchus or lung: Secondary | ICD-10-CM

## 2020-01-26 DIAGNOSIS — Z5111 Encounter for antineoplastic chemotherapy: Secondary | ICD-10-CM

## 2020-01-26 DIAGNOSIS — Z5112 Encounter for antineoplastic immunotherapy: Secondary | ICD-10-CM | POA: Diagnosis not present

## 2020-01-26 DIAGNOSIS — R5383 Other fatigue: Secondary | ICD-10-CM | POA: Diagnosis not present

## 2020-01-26 DIAGNOSIS — C3412 Malignant neoplasm of upper lobe, left bronchus or lung: Secondary | ICD-10-CM

## 2020-01-26 DIAGNOSIS — Z95828 Presence of other vascular implants and grafts: Secondary | ICD-10-CM

## 2020-01-26 LAB — CMP (CANCER CENTER ONLY)
ALT: 13 U/L (ref 0–44)
AST: 14 U/L — ABNORMAL LOW (ref 15–41)
Albumin: 3.3 g/dL — ABNORMAL LOW (ref 3.5–5.0)
Alkaline Phosphatase: 65 U/L (ref 38–126)
Anion gap: 9 (ref 5–15)
BUN: 9 mg/dL (ref 8–23)
CO2: 24 mmol/L (ref 22–32)
Calcium: 8.8 mg/dL — ABNORMAL LOW (ref 8.9–10.3)
Chloride: 106 mmol/L (ref 98–111)
Creatinine: 0.75 mg/dL (ref 0.61–1.24)
GFR, Est AFR Am: >60 mL/min (ref >60)
GFR, Estimated: >60 mL/min (ref >60)
Glucose, Bld: 107 mg/dL — ABNORMAL HIGH (ref 70–99)
Potassium: 4.3 mmol/L (ref 3.5–5.1)
Sodium: 139 mmol/L (ref 135–145)
Total Bilirubin: <0.2 mg/dL — ABNORMAL LOW (ref 0.3–1.2)
Total Protein: 6 g/dL — ABNORMAL LOW (ref 6.5–8.1)

## 2020-01-26 LAB — CBC WITH DIFFERENTIAL (CANCER CENTER ONLY)
Abs Immature Granulocytes: 0.19 10*3/uL — ABNORMAL HIGH (ref 0.00–0.07)
Basophils Absolute: 0.1 10*3/uL (ref 0.0–0.1)
Basophils Relative: 1 %
Eosinophils Absolute: 0.1 10*3/uL (ref 0.0–0.5)
Eosinophils Relative: 2 %
HCT: 33.3 % — ABNORMAL LOW (ref 39.0–52.0)
Hemoglobin: 11.4 g/dL — ABNORMAL LOW (ref 13.0–17.0)
Immature Granulocytes: 3 %
Lymphocytes Relative: 27 %
Lymphs Abs: 1.6 10*3/uL (ref 0.7–4.0)
MCH: 33.8 pg (ref 26.0–34.0)
MCHC: 34.2 g/dL (ref 30.0–36.0)
MCV: 98.8 fL (ref 80.0–100.0)
Monocytes Absolute: 0.7 10*3/uL (ref 0.1–1.0)
Monocytes Relative: 12 %
Neutro Abs: 3.2 10*3/uL (ref 1.7–7.7)
Neutrophils Relative %: 55 %
Platelet Count: 249 10*3/uL (ref 150–400)
RBC: 3.37 MIL/uL — ABNORMAL LOW (ref 4.22–5.81)
RDW: 17.4 % — ABNORMAL HIGH (ref 11.5–15.5)
WBC Count: 5.8 10*3/uL (ref 4.0–10.5)
nRBC: 0 % (ref 0.0–0.2)

## 2020-01-26 MED ORDER — SODIUM CHLORIDE 0.9% FLUSH
10.0000 mL | INTRAVENOUS | Status: DC | PRN
  Administered 2020-01-26: 10 mL
  Filled 2020-01-26: qty 10

## 2020-01-26 MED ORDER — PALONOSETRON HCL INJECTION 0.25 MG/5ML
INTRAVENOUS | Status: AC
  Filled 2020-01-26: qty 5

## 2020-01-26 MED ORDER — PALONOSETRON HCL INJECTION 0.25 MG/5ML
0.2500 mg | Freq: Once | INTRAVENOUS | Status: AC
  Administered 2020-01-26: 0.25 mg via INTRAVENOUS

## 2020-01-26 MED ORDER — CYANOCOBALAMIN 1000 MCG/ML IJ SOLN
INTRAMUSCULAR | Status: AC
  Filled 2020-01-26: qty 1

## 2020-01-26 MED ORDER — SODIUM CHLORIDE 0.9 % IV SOLN
500.0000 mg/m2 | Freq: Once | INTRAVENOUS | Status: AC
  Administered 2020-01-26: 900 mg via INTRAVENOUS
  Filled 2020-01-26: qty 16

## 2020-01-26 MED ORDER — SODIUM CHLORIDE 0.9 % IV SOLN
200.0000 mg | Freq: Once | INTRAVENOUS | Status: AC
  Administered 2020-01-26: 200 mg via INTRAVENOUS
  Filled 2020-01-26: qty 8

## 2020-01-26 MED ORDER — SODIUM CHLORIDE 0.9% FLUSH
10.0000 mL | Freq: Once | INTRAVENOUS | Status: AC
  Administered 2020-01-26: 10 mL
  Filled 2020-01-26: qty 10

## 2020-01-26 MED ORDER — CYANOCOBALAMIN 1000 MCG/ML IJ SOLN
1000.0000 ug | Freq: Once | INTRAMUSCULAR | Status: AC
  Administered 2020-01-26: 1000 ug via INTRAMUSCULAR

## 2020-01-26 MED ORDER — HEPARIN SOD (PORK) LOCK FLUSH 100 UNIT/ML IV SOLN
500.0000 [IU] | Freq: Once | INTRAVENOUS | Status: AC | PRN
  Administered 2020-01-26: 500 [IU]
  Filled 2020-01-26: qty 5

## 2020-01-26 MED ORDER — SODIUM CHLORIDE 0.9 % IV SOLN
Freq: Once | INTRAVENOUS | Status: AC
  Filled 2020-01-26: qty 250

## 2020-01-26 MED ORDER — SODIUM CHLORIDE 0.9 % IV SOLN
476.5000 mg | Freq: Once | INTRAVENOUS | Status: AC
  Administered 2020-01-26: 480 mg via INTRAVENOUS
  Filled 2020-01-26: qty 48

## 2020-01-26 MED ORDER — SODIUM CHLORIDE 0.9 % IV SOLN
10.0000 mg | Freq: Once | INTRAVENOUS | Status: AC
  Administered 2020-01-26: 10 mg via INTRAVENOUS
  Filled 2020-01-26: qty 10

## 2020-01-26 MED ORDER — SODIUM CHLORIDE 0.9 % IV SOLN
150.0000 mg | Freq: Once | INTRAVENOUS | Status: AC
  Administered 2020-01-26: 150 mg via INTRAVENOUS
  Filled 2020-01-26: qty 150

## 2020-01-26 NOTE — Patient Instructions (Signed)
Thrombophlebitis Thrombophlebitis is a condition in which a blood clot forms in a vein. This can happen in your arms or legs, or in the area between your neck and groin (torso). When this condition happens in a vein that is close to the surface of the body (superficial thrombophlebitis), it is usually not serious.However, when the condition happens in a vein that is deep inside the body (deep vein thrombosis, DVT), it can cause serious problems. What are the causes? This condition may be caused by:  Damage to a vein.  Inflammation of the veins.  A condition that causes blood to clot more easily.  Reduced blood flow through the veins. What increases the risk? The following factors may make you more likely to develop this condition:  Having a condition that makes blood thicker or more likely to clot.  Having an infection.  Having major surgery.  Experiencing a traumatic injury or a broken bone.  Having a catheter in a vein (central line).  Having a condition in which valves in the veins do not work properly, causing blood to collect (pool) in the veins (chronic venous insufficiency).  An inactive (sedentary) lifestyle.  Pregnancy or having recently given birth.  Cancer.  Older age, especially being 13 or older.  Obesity.  Smoking.  Taking medicines that contain estrogen, such as birth control pills.  Having varicose veins.  Using drugs that are injected into the veins (intravenous, IV). What are the signs or symptoms? The main symptoms of this condition are:  Swelling and pain in an arm or leg. If the affected vein is in the leg, you may feel pain while standing or walking.  Warmth or redness in an arm or leg. Other symptoms include:  Low-grade fever.  Muscle aches.  A bulging vein (venous distension). In some cases, there are no symptoms. How is this diagnosed? This condition may be diagnosed based on:  Your symptoms and medical history.  A physical exam.   Tests, such as: ? Blood tests. ? A test that uses sound waves to make images (ultrasound). How is this treated? Treatment depends on how severe the condition is and which area of the body is affected. Treatment may include:  Applying a warm compress or heating pad to affected areas.  Wearing compression stockings to help prevent blood clots and reduce swelling in your legs.  Raising (elevating) the affected arm or leg above the level of your heart.  Medicines, such as: ? Anti-inflammatory medicines, such as ibuprofen. ? Blood thinners (anticoagulants), such as heparin. ? Antibiotic medicine, if you have an infection.  Removing an IV that may be causing the problem. In rare cases, surgery may be needed to:  Remove a damaged section of a vein.  Place a filter in a large vein to catch blood clots before they reach the lungs. Follow these instructions at home: Medicines  Take over-the-counter and prescription medicines only as told by your health care provider.  If you were prescribed an antibiotic, take it as told by your health care provider. Do not stop using the antibiotic even if you feel better. Managing pain, stiffness, and swelling   If directed, put heat on the affected area as often as told by your health care provider. Use the heat source that your health care provider recommends, such as a moist heat pack or a heating pad. ? Place a towel between your skin and the heat source. ? Leave the heat on for 20-30 minutes. ? Remove the heat  if your skin turns bright red. This is especially important if you are not able to feel pain, heat, or cold. You may have a greater risk of getting burned.  Elevate the affected area above the level of your heart while you are sitting or lying down. Activity  Return to your normal activities as told by your health care provider. Ask your health care provider what activities are safe for you.  Avoid sitting or lying down for long  periods. If possible, stand up and walk around regularly. If you are taking blood thinners:  Take your medicine exactly as told, at the same time every day.  Avoid activities that could cause injury or bruising, and follow instructions about how to prevent falls.  Wear a medical alert bracelet or carry a card that lists what medicines you take. General instructions  Drink enough fluid to keep your urine pale yellow.  Wear compression stockings as told by your health care provider.  Do not use any products that contain nicotine or tobacco, such as cigarettes and e-cigarettes. If you need help quitting, ask your health care provider.  Keep all follow-up visits as told by your health care provider. This is important. Contact a health care provider if:  You miss a dose of your blood thinner, if applicable.  Your symptoms do not improve.  You have unusual bruising.  You have nausea, vomiting, or diarrhea that lasts for more than one day. Get help right away if:  You have any of these problems: ? New or worse pain, swelling, or redness in an arm or leg. ? Numbness or tingling in an arm or leg. ? Shortness of breath. ? Chest pain. ? Severe pain in your abdomen. ? Fast breathing. ? A fast or irregular heartbeat. ? Blood in your vomit, stool, or urine. ? A severe headache or confusion. ? A cut that does not stop bleeding.  You feel light-headed or dizzy.  You cough up blood.  You have a serious fall or accident, or you hit your head. These symptoms may represent a serious problem that is an emergency. Do not wait to see if the symptoms will go away. Get medical help right away. Call your local emergency services (911 in the [place] Do not drive yourself to the hospital. Summary  Thrombophlebitis is a condition in which a blood clot forms in a vein. This can happen in a vein close to the surface of the body or a vein deep inside the body.  This condition can cause serious  problems when it happens in a vein deep inside the body (deep vein thrombosis, DVT).  The main symptom of this condition is swelling and pain around the affected vein.  Treatment may include warm compresses, anti-inflammatory medicines, or blood thinners. This information is not intended to replace advice given to you by your health care provider. Make sure you discuss any questions you have with your health care provider. Document Revised: 11/10/2018 Document Reviewed: 01/14/2017 Elsevier Patient Education  [place].

## 2020-01-26 NOTE — Patient Instructions (Signed)
[facility] Discharge Instructions for Patients Receiving Chemotherapy  Today you received the following chemotherapy agents Keytruda, Alimta, and Carboplatin  To help prevent nausea and vomiting after your treatment, we encourage you to take your nausea medication as directed.  If you develop nausea and vomiting that is not controlled by your nausea medication, call the clinic.   BELOW ARE SYMPTOMS THAT SHOULD BE REPORTED IMMEDIATELY:  *FEVER GREATER THAN 100.5 F  *CHILLS WITH OR WITHOUT FEVER  NAUSEA AND VOMITING THAT IS NOT CONTROLLED WITH YOUR NAUSEA MEDICATION  *UNUSUAL SHORTNESS OF BREATH  *UNUSUAL BRUISING OR BLEEDING  TENDERNESS IN MOUTH AND THROAT WITH OR WITHOUT PRESENCE OF ULCERS  *URINARY PROBLEMS  *BOWEL PROBLEMS  UNUSUAL RASH Items with * indicate a potential emergency and should be followed up as soon as possible.  Feel free to call the clinic should you have any questions or concerns. The clinic phone number is [phone].  Please show the CHEMO ALERT CARD at check-in to the Emergency Department and triage nurse.   

## 2020-02-02 ENCOUNTER — Inpatient Hospital Stay: Payer: Medicare Other

## 2020-02-02 ENCOUNTER — Other Ambulatory Visit: Payer: Self-pay

## 2020-02-02 ENCOUNTER — Inpatient Hospital Stay: Payer: Medicare Other | Attending: Internal Medicine

## 2020-02-02 DIAGNOSIS — C3492 Malignant neoplasm of unspecified part of left bronchus or lung: Secondary | ICD-10-CM

## 2020-02-02 DIAGNOSIS — C3412 Malignant neoplasm of upper lobe, left bronchus or lung: Secondary | ICD-10-CM | POA: Insufficient documentation

## 2020-02-02 DIAGNOSIS — Z5111 Encounter for antineoplastic chemotherapy: Secondary | ICD-10-CM | POA: Insufficient documentation

## 2020-02-02 DIAGNOSIS — Z5112 Encounter for antineoplastic immunotherapy: Secondary | ICD-10-CM | POA: Insufficient documentation

## 2020-02-02 DIAGNOSIS — Z79899 Other long term (current) drug therapy: Secondary | ICD-10-CM | POA: Diagnosis not present

## 2020-02-02 DIAGNOSIS — Z95828 Presence of other vascular implants and grafts: Secondary | ICD-10-CM

## 2020-02-02 LAB — CMP (CANCER CENTER ONLY)
ALT: 13 U/L (ref 0–44)
AST: 13 U/L — ABNORMAL LOW (ref 15–41)
Albumin: 3.4 g/dL — ABNORMAL LOW (ref 3.5–5.0)
Alkaline Phosphatase: 69 U/L (ref 38–126)
Anion gap: 8 (ref 5–15)
BUN: 13 mg/dL (ref 8–23)
CO2: 24 mmol/L (ref 22–32)
Calcium: 8.7 mg/dL — ABNORMAL LOW (ref 8.9–10.3)
Chloride: 102 mmol/L (ref 98–111)
Creatinine: 0.74 mg/dL (ref 0.61–1.24)
GFR, Est AFR Am: >60 mL/min (ref >60)
GFR, Estimated: >60 mL/min (ref >60)
Glucose, Bld: 179 mg/dL — ABNORMAL HIGH (ref 70–99)
Potassium: 3.7 mmol/L (ref 3.5–5.1)
Sodium: 134 mmol/L — ABNORMAL LOW (ref 135–145)
Total Bilirubin: 0.3 mg/dL (ref 0.3–1.2)
Total Protein: 6.1 g/dL — ABNORMAL LOW (ref 6.5–8.1)

## 2020-02-02 LAB — CBC WITH DIFFERENTIAL (CANCER CENTER ONLY)
Abs Immature Granulocytes: 0.03 10*3/uL (ref 0.00–0.07)
Basophils Absolute: 0 10*3/uL (ref 0.0–0.1)
Basophils Relative: 1 %
Eosinophils Absolute: 0.1 10*3/uL (ref 0.0–0.5)
Eosinophils Relative: 2 %
HCT: 31.1 % — ABNORMAL LOW (ref 39.0–52.0)
Hemoglobin: 11 g/dL — ABNORMAL LOW (ref 13.0–17.0)
Immature Granulocytes: 1 %
Lymphocytes Relative: 24 %
Lymphs Abs: 0.9 10*3/uL (ref 0.7–4.0)
MCH: 33.7 pg (ref 26.0–34.0)
MCHC: 35.4 g/dL (ref 30.0–36.0)
MCV: 95.4 fL (ref 80.0–100.0)
Monocytes Absolute: 0.3 10*3/uL (ref 0.1–1.0)
Monocytes Relative: 8 %
Neutro Abs: 2.6 10*3/uL (ref 1.7–7.7)
Neutrophils Relative %: 64 %
Platelet Count: 144 10*3/uL — ABNORMAL LOW (ref 150–400)
RBC: 3.26 MIL/uL — ABNORMAL LOW (ref 4.22–5.81)
RDW: 15.9 % — ABNORMAL HIGH (ref 11.5–15.5)
WBC Count: 4 10*3/uL (ref 4.0–10.5)
nRBC: 0 % (ref 0.0–0.2)

## 2020-02-02 LAB — TSH: TSH: 0.726 u[IU]/mL (ref 0.320–4.118)

## 2020-02-02 MED ORDER — HEPARIN SOD (PORK) LOCK FLUSH 100 UNIT/ML IV SOLN
500.0000 [IU] | Freq: Once | INTRAVENOUS | Status: AC
  Administered 2020-02-02: 500 [IU]
  Filled 2020-02-02: qty 5

## 2020-02-02 MED ORDER — SODIUM CHLORIDE 0.9% FLUSH
10.0000 mL | Freq: Once | INTRAVENOUS | Status: AC
  Administered 2020-02-02: 10 mL
  Filled 2020-02-02: qty 10

## 2020-02-09 ENCOUNTER — Inpatient Hospital Stay: Payer: Medicare Other

## 2020-02-09 ENCOUNTER — Other Ambulatory Visit: Payer: Self-pay

## 2020-02-09 ENCOUNTER — Ambulatory Visit (HOSPITAL_COMMUNITY)
Admission: RE | Admit: 2020-02-09 | Discharge: 2020-02-09 | Disposition: A | Discharge status: Home / Self Care | Payer: Medicare Other | Source: Ambulatory Visit | Attending: Physician Assistant | Admitting: Physician Assistant

## 2020-02-09 DIAGNOSIS — C3492 Malignant neoplasm of unspecified part of left bronchus or lung: Secondary | ICD-10-CM | POA: Insufficient documentation

## 2020-02-09 DIAGNOSIS — Z95828 Presence of other vascular implants and grafts: Secondary | ICD-10-CM

## 2020-02-09 DIAGNOSIS — Z5112 Encounter for antineoplastic immunotherapy: Secondary | ICD-10-CM | POA: Diagnosis not present

## 2020-02-09 LAB — CBC WITH DIFFERENTIAL (CANCER CENTER ONLY)
Abs Immature Granulocytes: 0.02 10*3/uL (ref 0.00–0.07)
Basophils Absolute: 0 10*3/uL (ref 0.0–0.1)
Basophils Relative: 0 %
Eosinophils Absolute: 0.1 10*3/uL (ref 0.0–0.5)
Eosinophils Relative: 2 %
HCT: 31 % — ABNORMAL LOW (ref 39.0–52.0)
Hemoglobin: 10.7 g/dL — ABNORMAL LOW (ref 13.0–17.0)
Immature Granulocytes: 0 %
Lymphocytes Relative: 18 %
Lymphs Abs: 1.1 10*3/uL (ref 0.7–4.0)
MCH: 33.8 pg (ref 26.0–34.0)
MCHC: 34.5 g/dL (ref 30.0–36.0)
MCV: 97.8 fL (ref 80.0–100.0)
Monocytes Absolute: 0.5 10*3/uL (ref 0.1–1.0)
Monocytes Relative: 9 %
Neutro Abs: 4.1 10*3/uL (ref 1.7–7.7)
Neutrophils Relative %: 71 %
Platelet Count: 112 10*3/uL — ABNORMAL LOW (ref 150–400)
RBC: 3.17 MIL/uL — ABNORMAL LOW (ref 4.22–5.81)
RDW: 16.7 % — ABNORMAL HIGH (ref 11.5–15.5)
WBC Count: 5.8 10*3/uL (ref 4.0–10.5)
nRBC: 0 % (ref 0.0–0.2)

## 2020-02-09 LAB — CMP (CANCER CENTER ONLY)
ALT: 14 U/L (ref 0–44)
AST: 11 U/L — ABNORMAL LOW (ref 15–41)
Albumin: 3.4 g/dL — ABNORMAL LOW (ref 3.5–5.0)
Alkaline Phosphatase: 77 U/L (ref 38–126)
Anion gap: 9 (ref 5–15)
BUN: 20 mg/dL (ref 8–23)
CO2: 23 mmol/L (ref 22–32)
Calcium: 8.9 mg/dL (ref 8.9–10.3)
Chloride: 106 mmol/L (ref 98–111)
Creatinine: 0.71 mg/dL (ref 0.61–1.24)
GFR, Est AFR Am: >60 mL/min (ref >60)
GFR, Estimated: >60 mL/min (ref >60)
Glucose, Bld: 138 mg/dL — ABNORMAL HIGH (ref 70–99)
Potassium: 4.3 mmol/L (ref 3.5–5.1)
Sodium: 138 mmol/L (ref 135–145)
Total Bilirubin: <0.2 mg/dL — ABNORMAL LOW (ref 0.3–1.2)
Total Protein: 6.4 g/dL — ABNORMAL LOW (ref 6.5–8.1)

## 2020-02-09 MED ORDER — SODIUM CHLORIDE (PF) 0.9 % IJ SOLN
INTRAMUSCULAR | Status: AC
  Filled 2020-02-09: qty 50

## 2020-02-09 MED ORDER — IOHEXOL 300 MG/ML  SOLN
100.0000 mL | Freq: Once | INTRAMUSCULAR | Status: AC | PRN
  Administered 2020-02-09: 100 mL via INTRAVENOUS

## 2020-02-09 MED ORDER — HEPARIN SOD (PORK) LOCK FLUSH 100 UNIT/ML IV SOLN
INTRAVENOUS | Status: AC
  Filled 2020-02-09: qty 5

## 2020-02-09 MED ORDER — SODIUM CHLORIDE 0.9% FLUSH
10.0000 mL | Freq: Once | INTRAVENOUS | Status: AC
  Administered 2020-02-09: 10 mL
  Filled 2020-02-09: qty 10

## 2020-02-09 NOTE — Progress Notes (Signed)
Pt left accessed for CT

## 2020-02-16 ENCOUNTER — Other Ambulatory Visit: Payer: Self-pay

## 2020-02-16 ENCOUNTER — Inpatient Hospital Stay: Payer: Medicare Other

## 2020-02-16 ENCOUNTER — Inpatient Hospital Stay (HOSPITAL_BASED_OUTPATIENT_CLINIC_OR_DEPARTMENT_OTHER): Payer: Medicare Other | Admitting: Internal Medicine

## 2020-02-16 ENCOUNTER — Encounter: Payer: Self-pay | Admitting: Internal Medicine

## 2020-02-16 VITALS — BP 92/71 | HR 94 | Temp 97.9°F | Resp 18 | Ht 68.0 in | Wt 144.6 lb

## 2020-02-16 DIAGNOSIS — C3492 Malignant neoplasm of unspecified part of left bronchus or lung: Secondary | ICD-10-CM

## 2020-02-16 DIAGNOSIS — C3412 Malignant neoplasm of upper lobe, left bronchus or lung: Secondary | ICD-10-CM

## 2020-02-16 DIAGNOSIS — Z5112 Encounter for antineoplastic immunotherapy: Secondary | ICD-10-CM

## 2020-02-16 DIAGNOSIS — Z5111 Encounter for antineoplastic chemotherapy: Secondary | ICD-10-CM | POA: Diagnosis not present

## 2020-02-16 DIAGNOSIS — Z95828 Presence of other vascular implants and grafts: Secondary | ICD-10-CM

## 2020-02-16 LAB — CBC WITH DIFFERENTIAL (CANCER CENTER ONLY)
Abs Immature Granulocytes: 0.05 10*3/uL (ref 0.00–0.07)
Basophils Absolute: 0.1 10*3/uL (ref 0.0–0.1)
Basophils Relative: 1 %
Eosinophils Absolute: 0.2 10*3/uL (ref 0.0–0.5)
Eosinophils Relative: 4 %
HCT: 32 % — ABNORMAL LOW (ref 39.0–52.0)
Hemoglobin: 10.9 g/dL — ABNORMAL LOW (ref 13.0–17.0)
Immature Granulocytes: 1 %
Lymphocytes Relative: 21 %
Lymphs Abs: 1.2 10*3/uL (ref 0.7–4.0)
MCH: 33.1 pg (ref 26.0–34.0)
MCHC: 34.1 g/dL (ref 30.0–36.0)
MCV: 97.3 fL (ref 80.0–100.0)
Monocytes Absolute: 0.8 10*3/uL (ref 0.1–1.0)
Monocytes Relative: 14 %
Neutro Abs: 3.5 10*3/uL (ref 1.7–7.7)
Neutrophils Relative %: 59 %
Platelet Count: 225 10*3/uL (ref 150–400)
RBC: 3.29 MIL/uL — ABNORMAL LOW (ref 4.22–5.81)
RDW: 17.6 % — ABNORMAL HIGH (ref 11.5–15.5)
WBC Count: 5.8 10*3/uL (ref 4.0–10.5)
nRBC: 0 % (ref 0.0–0.2)

## 2020-02-16 LAB — CMP (CANCER CENTER ONLY)
ALT: 14 U/L (ref 0–44)
AST: 19 U/L (ref 15–41)
Albumin: 3.5 g/dL (ref 3.5–5.0)
Alkaline Phosphatase: 72 U/L (ref 38–126)
Anion gap: 11 (ref 5–15)
BUN: 8 mg/dL (ref 8–23)
CO2: 24 mmol/L (ref 22–32)
Calcium: 9.3 mg/dL (ref 8.9–10.3)
Chloride: 103 mmol/L (ref 98–111)
Creatinine: 0.72 mg/dL (ref 0.61–1.24)
GFR, Est AFR Am: >60 mL/min (ref >60)
GFR, Estimated: >60 mL/min (ref >60)
Glucose, Bld: 138 mg/dL — ABNORMAL HIGH (ref 70–99)
Potassium: 4.2 mmol/L (ref 3.5–5.1)
Sodium: 138 mmol/L (ref 135–145)
Total Bilirubin: 0.3 mg/dL (ref 0.3–1.2)
Total Protein: 6.6 g/dL (ref 6.5–8.1)

## 2020-02-16 MED ORDER — SODIUM CHLORIDE 0.9% FLUSH
10.0000 mL | INTRAVENOUS | Status: DC | PRN
  Administered 2020-02-16: 10 mL
  Filled 2020-02-16: qty 10

## 2020-02-16 MED ORDER — SODIUM CHLORIDE 0.9 % IV SOLN
10.0000 mg | Freq: Once | INTRAVENOUS | Status: AC
  Administered 2020-02-16: 10 mg via INTRAVENOUS
  Filled 2020-02-16: qty 10

## 2020-02-16 MED ORDER — SODIUM CHLORIDE 0.9 % IV SOLN
476.5000 mg | Freq: Once | INTRAVENOUS | Status: AC
  Administered 2020-02-16: 480 mg via INTRAVENOUS
  Filled 2020-02-16: qty 48

## 2020-02-16 MED ORDER — SODIUM CHLORIDE 0.9 % IV SOLN
200.0000 mg | Freq: Once | INTRAVENOUS | Status: AC
  Administered 2020-02-16: 200 mg via INTRAVENOUS
  Filled 2020-02-16: qty 8

## 2020-02-16 MED ORDER — SODIUM CHLORIDE 0.9 % IV SOLN
500.0000 mg/m2 | Freq: Once | INTRAVENOUS | Status: AC
  Administered 2020-02-16: 900 mg via INTRAVENOUS
  Filled 2020-02-16: qty 20

## 2020-02-16 MED ORDER — SODIUM CHLORIDE 0.9% FLUSH
10.0000 mL | Freq: Once | INTRAVENOUS | Status: AC
  Administered 2020-02-16: 10 mL
  Filled 2020-02-16: qty 10

## 2020-02-16 MED ORDER — SODIUM CHLORIDE 0.9 % IV SOLN
150.0000 mg | Freq: Once | INTRAVENOUS | Status: AC
  Administered 2020-02-16: 150 mg via INTRAVENOUS
  Filled 2020-02-16: qty 150

## 2020-02-16 MED ORDER — PALONOSETRON HCL INJECTION 0.25 MG/5ML
0.2500 mg | Freq: Once | INTRAVENOUS | Status: AC
  Administered 2020-02-16: 0.25 mg via INTRAVENOUS

## 2020-02-16 MED ORDER — HEPARIN SOD (PORK) LOCK FLUSH 100 UNIT/ML IV SOLN
500.0000 [IU] | Freq: Once | INTRAVENOUS | Status: AC | PRN
  Administered 2020-02-16: 500 [IU]
  Filled 2020-02-16: qty 5

## 2020-02-16 MED ORDER — PALONOSETRON HCL INJECTION 0.25 MG/5ML
INTRAVENOUS | Status: AC
  Filled 2020-02-16: qty 5

## 2020-02-16 MED ORDER — SODIUM CHLORIDE 0.9 % IV SOLN
Freq: Once | INTRAVENOUS | Status: AC
  Filled 2020-02-16: qty 250
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
## 2020-02-16 NOTE — Patient Instructions (Signed)
[facility] Discharge Instructions for Patients Receiving Chemotherapy  Today you received the following chemotherapy agents: Keytruda/Alimta/Carboplatin.  To help prevent nausea and vomiting after your treatment, we encourage you to take your nausea medication as directed.   If you develop nausea and vomiting that is not controlled by your nausea medication, call the clinic.   BELOW ARE SYMPTOMS THAT SHOULD BE REPORTED IMMEDIATELY:  *FEVER GREATER THAN 100.5 F  *CHILLS WITH OR WITHOUT FEVER  NAUSEA AND VOMITING THAT IS NOT CONTROLLED WITH YOUR NAUSEA MEDICATION  *UNUSUAL SHORTNESS OF BREATH  *UNUSUAL BRUISING OR BLEEDING  TENDERNESS IN MOUTH AND THROAT WITH OR WITHOUT PRESENCE OF ULCERS  *URINARY PROBLEMS  *BOWEL PROBLEMS  UNUSUAL RASH Items with * indicate a potential emergency and should be followed up as soon as possible.  Feel free to call the clinic should you have any questions or concerns. The clinic phone number is [phone].  Please show the [facility] at check-in to the Emergency Department and triage nurse.

## 2020-02-22 ENCOUNTER — Telehealth: Payer: Self-pay | Admitting: Internal Medicine

## 2020-02-22 ENCOUNTER — Telehealth: Payer: Self-pay | Admitting: Medical Oncology

## 2020-02-22 NOTE — Telephone Encounter (Signed)
Scheduled appt per 7/21 sch msg - pt aware

## 2020-02-22 NOTE — Telephone Encounter (Signed)
Schedule message sent for labs tomorrow and the next 2 weeks.

## 2020-02-23 ENCOUNTER — Inpatient Hospital Stay: Payer: Medicare Other

## 2020-02-23 ENCOUNTER — Other Ambulatory Visit: Payer: Self-pay

## 2020-02-23 VITALS — BP 102/57 | HR 94 | Temp 98.2°F | Resp 18

## 2020-02-23 DIAGNOSIS — Z5112 Encounter for antineoplastic immunotherapy: Secondary | ICD-10-CM | POA: Diagnosis not present

## 2020-02-23 DIAGNOSIS — C3492 Malignant neoplasm of unspecified part of left bronchus or lung: Secondary | ICD-10-CM

## 2020-02-23 DIAGNOSIS — Z95828 Presence of other vascular implants and grafts: Secondary | ICD-10-CM

## 2020-02-23 LAB — CBC WITH DIFFERENTIAL (CANCER CENTER ONLY)
Abs Immature Granulocytes: 0.01 10*3/uL (ref 0.00–0.07)
Basophils Absolute: 0 10*3/uL (ref 0.0–0.1)
Basophils Relative: 1 %
Eosinophils Absolute: 0.1 10*3/uL (ref 0.0–0.5)
Eosinophils Relative: 3 %
HCT: 28.3 % — ABNORMAL LOW (ref 39.0–52.0)
Hemoglobin: 10 g/dL — ABNORMAL LOW (ref 13.0–17.0)
Immature Granulocytes: 0 %
Lymphocytes Relative: 29 %
Lymphs Abs: 0.7 10*3/uL (ref 0.7–4.0)
MCH: 34.4 pg — ABNORMAL HIGH (ref 26.0–34.0)
MCHC: 35.3 g/dL (ref 30.0–36.0)
MCV: 97.3 fL (ref 80.0–100.0)
Monocytes Absolute: 0.2 10*3/uL (ref 0.1–1.0)
Monocytes Relative: 11 %
Neutro Abs: 1.3 10*3/uL — ABNORMAL LOW (ref 1.7–7.7)
Neutrophils Relative %: 56 %
Platelet Count: 144 10*3/uL — ABNORMAL LOW (ref 150–400)
RBC: 2.91 MIL/uL — ABNORMAL LOW (ref 4.22–5.81)
RDW: 16.6 % — ABNORMAL HIGH (ref 11.5–15.5)
WBC Count: 2.3 10*3/uL — ABNORMAL LOW (ref 4.0–10.5)
nRBC: 0 % (ref 0.0–0.2)

## 2020-02-23 LAB — CMP (CANCER CENTER ONLY)
ALT: 14 U/L (ref 0–44)
AST: 17 U/L (ref 15–41)
Albumin: 3.5 g/dL (ref 3.5–5.0)
Alkaline Phosphatase: 69 U/L (ref 38–126)
Anion gap: 8 (ref 5–15)
BUN: 6 mg/dL — ABNORMAL LOW (ref 8–23)
CO2: 28 mmol/L (ref 22–32)
Calcium: 8.8 mg/dL — ABNORMAL LOW (ref 8.9–10.3)
Chloride: 102 mmol/L (ref 98–111)
Creatinine: 0.45 mg/dL — ABNORMAL LOW (ref 0.61–1.24)
GFR, Est AFR Am: >60 mL/min (ref >60)
GFR, Estimated: >60 mL/min (ref >60)
Glucose, Bld: 132 mg/dL — ABNORMAL HIGH (ref 70–99)
Potassium: 3.8 mmol/L (ref 3.5–5.1)
Sodium: 138 mmol/L (ref 135–145)
Total Bilirubin: 0.5 mg/dL (ref 0.3–1.2)
Total Protein: 6.3 g/dL — ABNORMAL LOW (ref 6.5–8.1)

## 2020-02-23 MED ORDER — HEPARIN SOD (PORK) LOCK FLUSH 100 UNIT/ML IV SOLN
500.0000 [IU] | Freq: Once | INTRAVENOUS | Status: AC
  Administered 2020-02-23: 500 [IU]
  Filled 2020-02-23: qty 5

## 2020-02-23 MED ORDER — SODIUM CHLORIDE 0.9% FLUSH
10.0000 mL | Freq: Once | INTRAVENOUS | Status: AC
  Administered 2020-02-23: 10 mL
  Filled 2020-02-23: qty 10

## 2020-02-23 NOTE — Patient Instructions (Signed)

## 2020-02-24 LAB — TSH: TSH: 0.911 u[IU]/mL (ref 0.320–4.118)

## 2020-03-01 ENCOUNTER — Inpatient Hospital Stay: Payer: Medicare Other

## 2020-03-01 ENCOUNTER — Other Ambulatory Visit: Payer: Self-pay

## 2020-03-01 DIAGNOSIS — Z95828 Presence of other vascular implants and grafts: Secondary | ICD-10-CM

## 2020-03-01 DIAGNOSIS — C3492 Malignant neoplasm of unspecified part of left bronchus or lung: Secondary | ICD-10-CM

## 2020-03-01 DIAGNOSIS — Z5112 Encounter for antineoplastic immunotherapy: Secondary | ICD-10-CM | POA: Diagnosis not present

## 2020-03-01 LAB — CBC WITH DIFFERENTIAL (CANCER CENTER ONLY)
Abs Immature Granulocytes: 0.01 10*3/uL (ref 0.00–0.07)
Basophils Absolute: 0 10*3/uL (ref 0.0–0.1)
Basophils Relative: 0 %
Eosinophils Absolute: 0.1 10*3/uL (ref 0.0–0.5)
Eosinophils Relative: 2 %
HCT: 26.6 % — ABNORMAL LOW (ref 39.0–52.0)
Hemoglobin: 9.3 g/dL — ABNORMAL LOW (ref 13.0–17.0)
Immature Granulocytes: 0 %
Lymphocytes Relative: 24 %
Lymphs Abs: 1.1 10*3/uL (ref 0.7–4.0)
MCH: 35.1 pg — ABNORMAL HIGH (ref 26.0–34.0)
MCHC: 35 g/dL (ref 30.0–36.0)
MCV: 100.4 fL — ABNORMAL HIGH (ref 80.0–100.0)
Monocytes Absolute: 0.7 10*3/uL (ref 0.1–1.0)
Monocytes Relative: 15 %
Neutro Abs: 2.7 10*3/uL (ref 1.7–7.7)
Neutrophils Relative %: 59 %
Platelet Count: 115 10*3/uL — ABNORMAL LOW (ref 150–400)
RBC: 2.65 MIL/uL — ABNORMAL LOW (ref 4.22–5.81)
RDW: 17.2 % — ABNORMAL HIGH (ref 11.5–15.5)
WBC Count: 4.7 10*3/uL (ref 4.0–10.5)
nRBC: 0 % (ref 0.0–0.2)

## 2020-03-01 LAB — CMP (CANCER CENTER ONLY)
ALT: 7 U/L (ref 0–44)
AST: 9 U/L — ABNORMAL LOW (ref 15–41)
Albumin: 3.4 g/dL — ABNORMAL LOW (ref 3.5–5.0)
Alkaline Phosphatase: 89 U/L (ref 38–126)
Anion gap: 9 (ref 5–15)
BUN: 9 mg/dL (ref 8–23)
CO2: 23 mmol/L (ref 22–32)
Calcium: 9.5 mg/dL (ref 8.9–10.3)
Chloride: 106 mmol/L (ref 98–111)
Creatinine: 0.79 mg/dL (ref 0.61–1.24)
GFR, Est AFR Am: >60 mL/min (ref >60)
GFR, Estimated: >60 mL/min (ref >60)
Glucose, Bld: 213 mg/dL — ABNORMAL HIGH (ref 70–99)
Potassium: 4.3 mmol/L (ref 3.5–5.1)
Sodium: 138 mmol/L (ref 135–145)
Total Bilirubin: <0.2 mg/dL — ABNORMAL LOW (ref 0.3–1.2)
Total Protein: 6.1 g/dL — ABNORMAL LOW (ref 6.5–8.1)

## 2020-03-01 MED ORDER — SODIUM CHLORIDE 0.9% FLUSH
10.0000 mL | Freq: Once | INTRAVENOUS | Status: AC
  Administered 2020-03-01: 10 mL
  Filled 2020-03-01: qty 10

## 2020-03-01 MED ORDER — HEPARIN SOD (PORK) LOCK FLUSH 100 UNIT/ML IV SOLN
500.0000 [IU] | Freq: Once | INTRAVENOUS | Status: AC
  Administered 2020-03-01: 500 [IU]
  Filled 2020-03-01: qty 5

## 2020-03-06 ENCOUNTER — Telehealth: Payer: Self-pay | Admitting: *Deleted

## 2020-03-06 ENCOUNTER — Other Ambulatory Visit: Payer: Self-pay | Admitting: Physician Assistant

## 2020-03-06 DIAGNOSIS — R630 Anorexia: Secondary | ICD-10-CM

## 2020-03-06 NOTE — Telephone Encounter (Signed)
Patients wife called to ask for suggestion on what he could take for allergy like symptoms.  Wanted to make sure that it was ok for him to take something OTC like Claritin or zyrtec.

## 2020-03-07 ENCOUNTER — Other Ambulatory Visit: Payer: Self-pay | Admitting: Medical Oncology

## 2020-03-07 DIAGNOSIS — C3492 Malignant neoplasm of unspecified part of left bronchus or lung: Secondary | ICD-10-CM

## 2020-03-08 ENCOUNTER — Other Ambulatory Visit: Payer: Self-pay

## 2020-03-08 ENCOUNTER — Inpatient Hospital Stay (HOSPITAL_BASED_OUTPATIENT_CLINIC_OR_DEPARTMENT_OTHER): Payer: Medicare Other | Admitting: Internal Medicine

## 2020-03-08 ENCOUNTER — Inpatient Hospital Stay: Payer: Medicare Other

## 2020-03-08 ENCOUNTER — Inpatient Hospital Stay: Payer: Medicare Other | Attending: Internal Medicine

## 2020-03-08 ENCOUNTER — Encounter: Payer: Self-pay | Admitting: Internal Medicine

## 2020-03-08 VITALS — BP 105/68 | HR 74 | Temp 97.3°F | Resp 16 | Ht 68.0 in | Wt 152.0 lb

## 2020-03-08 DIAGNOSIS — Z5111 Encounter for antineoplastic chemotherapy: Secondary | ICD-10-CM | POA: Insufficient documentation

## 2020-03-08 DIAGNOSIS — C3492 Malignant neoplasm of unspecified part of left bronchus or lung: Secondary | ICD-10-CM | POA: Diagnosis not present

## 2020-03-08 DIAGNOSIS — Z5112 Encounter for antineoplastic immunotherapy: Secondary | ICD-10-CM | POA: Insufficient documentation

## 2020-03-08 DIAGNOSIS — Z79899 Other long term (current) drug therapy: Secondary | ICD-10-CM | POA: Diagnosis not present

## 2020-03-08 DIAGNOSIS — C3412 Malignant neoplasm of upper lobe, left bronchus or lung: Secondary | ICD-10-CM

## 2020-03-08 LAB — CBC WITH DIFFERENTIAL (CANCER CENTER ONLY)
Abs Immature Granulocytes: 0.44 10*3/uL — ABNORMAL HIGH (ref 0.00–0.07)
Basophils Absolute: 0.1 10*3/uL (ref 0.0–0.1)
Basophils Relative: 1 %
Eosinophils Absolute: 0.2 10*3/uL (ref 0.0–0.5)
Eosinophils Relative: 3 %
HCT: 29.2 % — ABNORMAL LOW (ref 39.0–52.0)
Hemoglobin: 9.8 g/dL — ABNORMAL LOW (ref 13.0–17.0)
Immature Granulocytes: 7 %
Lymphocytes Relative: 27 %
Lymphs Abs: 1.8 10*3/uL (ref 0.7–4.0)
MCH: 34.9 pg — ABNORMAL HIGH (ref 26.0–34.0)
MCHC: 33.6 g/dL (ref 30.0–36.0)
MCV: 103.9 fL — ABNORMAL HIGH (ref 80.0–100.0)
Monocytes Absolute: 1.1 10*3/uL — ABNORMAL HIGH (ref 0.1–1.0)
Monocytes Relative: 16 %
Neutro Abs: 3.2 10*3/uL (ref 1.7–7.7)
Neutrophils Relative %: 46 %
Platelet Count: 232 10*3/uL (ref 150–400)
RBC: 2.81 MIL/uL — ABNORMAL LOW (ref 4.22–5.81)
RDW: 20.8 % — ABNORMAL HIGH (ref 11.5–15.5)
WBC Count: 6.7 10*3/uL (ref 4.0–10.5)
nRBC: 3.9 % — ABNORMAL HIGH (ref 0.0–0.2)

## 2020-03-08 LAB — CMP (CANCER CENTER ONLY)
ALT: 7 U/L (ref 0–44)
AST: 11 U/L — ABNORMAL LOW (ref 15–41)
Albumin: 3.3 g/dL — ABNORMAL LOW (ref 3.5–5.0)
Alkaline Phosphatase: 76 U/L (ref 38–126)
Anion gap: 8 (ref 5–15)
BUN: 11 mg/dL (ref 8–23)
CO2: 26 mmol/L (ref 22–32)
Calcium: 8.9 mg/dL (ref 8.9–10.3)
Chloride: 105 mmol/L (ref 98–111)
Creatinine: 0.7 mg/dL (ref 0.61–1.24)
GFR, Est AFR Am: >60 mL/min (ref >60)
GFR, Estimated: >60 mL/min (ref >60)
Glucose, Bld: 121 mg/dL — ABNORMAL HIGH (ref 70–99)
Potassium: 4.5 mmol/L (ref 3.5–5.1)
Sodium: 139 mmol/L (ref 135–145)
Total Bilirubin: <0.2 mg/dL — ABNORMAL LOW (ref 0.3–1.2)
Total Protein: 6 g/dL — ABNORMAL LOW (ref 6.5–8.1)

## 2020-03-08 MED ORDER — PROCHLORPERAZINE MALEATE 10 MG PO TABS
ORAL_TABLET | ORAL | Status: AC
  Filled 2020-03-08: qty 1

## 2020-03-08 MED ORDER — SODIUM CHLORIDE 0.9 % IV SOLN
200.0000 mg | Freq: Once | INTRAVENOUS | Status: AC
  Administered 2020-03-08: 200 mg via INTRAVENOUS
  Filled 2020-03-08: qty 8

## 2020-03-08 MED ORDER — PROCHLORPERAZINE MALEATE 10 MG PO TABS
10.0000 mg | ORAL_TABLET | Freq: Once | ORAL | Status: AC
  Administered 2020-03-08: 10 mg via ORAL

## 2020-03-08 MED ORDER — SODIUM CHLORIDE 0.9 % IV SOLN
Freq: Once | INTRAVENOUS | Status: AC
  Filled 2020-03-08: qty 250

## 2020-03-08 MED ORDER — HEPARIN SOD (PORK) LOCK FLUSH 100 UNIT/ML IV SOLN
500.0000 [IU] | Freq: Once | INTRAVENOUS | Status: AC | PRN
  Administered 2020-03-08: 500 [IU]
  Filled 2020-03-08: qty 5

## 2020-03-08 MED ORDER — SODIUM CHLORIDE 0.9 % IV SOLN
500.0000 mg/m2 | Freq: Once | INTRAVENOUS | Status: AC
  Administered 2020-03-08: 900 mg via INTRAVENOUS
  Filled 2020-03-08: qty 20

## 2020-03-08 MED ORDER — LEVOCETIRIZINE DIHYDROCHLORIDE 5 MG PO TABS: 5.0000 mg | ORAL_TABLET | Freq: Every evening | ORAL | 0 refills | Status: DC

## 2020-03-08 MED ORDER — SODIUM CHLORIDE 0.9% FLUSH
10.0000 mL | INTRAVENOUS | Status: DC | PRN
  Administered 2020-03-08: 10 mL
  Filled 2020-03-08: qty 10

## 2020-03-08 NOTE — Patient Instructions (Signed)
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
## 2020-03-08 NOTE — Patient Instructions (Signed)
[facility] Discharge Instructions for Patients Receiving Chemotherapy  Today you received the following chemotherapy agents Keytruda; Alimta  To help prevent nausea and vomiting after your treatment, we encourage you to take your nausea medication as directed   If you develop nausea and vomiting that is not controlled by your nausea medication, call the clinic.   BELOW ARE SYMPTOMS THAT SHOULD BE REPORTED IMMEDIATELY:  *FEVER GREATER THAN 100.5 F  *CHILLS WITH OR WITHOUT FEVER  NAUSEA AND VOMITING THAT IS NOT CONTROLLED WITH YOUR NAUSEA MEDICATION  *UNUSUAL SHORTNESS OF BREATH  *UNUSUAL BRUISING OR BLEEDING  TENDERNESS IN MOUTH AND THROAT WITH OR WITHOUT PRESENCE OF ULCERS  *URINARY PROBLEMS  *BOWEL PROBLEMS  UNUSUAL RASH Items with * indicate a potential emergency and should be followed up as soon as possible.  Feel free to call the clinic should you have any questions or concerns. The clinic phone number is [phone].  Please show the CHEMO ALERT CARD at check-in to the Emergency Department and triage nurse.   

## 2020-03-08 NOTE — Patient Instructions (Signed)
Steps to Quit Smoking Smoking tobacco is the leading cause of preventable death. It can affect almost every organ in the body. Smoking puts you and people around you at risk for many serious, long-lasting (chronic) diseases. Quitting smoking can be hard, but it is one of the best things that you can do for your health. It is never too late to quit. How do I get ready to quit? When you decide to quit smoking, make a plan to help you succeed. Before you quit:  Pick a date to quit. Set a date within the next 2 weeks to give you time to prepare.  Write down the reasons why you are quitting. Keep this list in places where you will see it often.  Tell your family, friends, and co-workers that you are quitting. Their support is important.  Talk with your doctor about the choices that may help you quit.  Find out if your health insurance will pay for these treatments.  Know the people, places, things, and activities that make you want to smoke (triggers). Avoid them. What first steps can I take to quit smoking?  Throw away all cigarettes at home, at work, and in your car.  Throw away the things that you use when you smoke, such as ashtrays and lighters.  Clean your car. Make sure to empty the ashtray.  Clean your home, including curtains and carpets. What can I do to help me quit smoking? Talk with your doctor about taking medicines and seeing a counselor at the same time. You are more likely to succeed when you do both.  If you are pregnant or breastfeeding, talk with your doctor about counseling or other ways to quit smoking. Do not take medicine to help you quit smoking unless your doctor tells you to do so. To quit smoking: Quit right away  Quit smoking totally, instead of slowly cutting back on how much you smoke over a period of time.  Go to counseling. You are more likely to quit if you go to counseling sessions regularly. Take medicine You may take medicines to help you quit. Some  medicines need a prescription, and some you can buy over-the-counter. Some medicines may contain a drug called nicotine to replace the nicotine in cigarettes. Medicines may:  Help you to stop having the desire to smoke (cravings).  Help to stop the problems that come when you stop smoking (withdrawal symptoms). Your doctor may ask you to use:  Nicotine patches, gum, or lozenges.  Nicotine inhalers or sprays.  Non-nicotine medicine that is taken by mouth. Find resources Find resources and other ways to help you quit smoking and remain smoke-free after you quit. These resources are most helpful when you use them often. They include:  Online chats with a counselor.  Phone quitlines.  Printed self-help materials.  Support groups or group counseling.  Text messaging programs.  Mobile phone apps. Use apps on your mobile phone or tablet that can help you stick to your quit plan. There are many free apps for mobile phones and tablets as well as websites. Examples include Quit Guide from the CDC and smokefree.gov  What things can I do to make it easier to quit?   Talk to your family and friends. Ask them to support and encourage you.  Call a phone quitline (1-800-QUIT-NOW), reach out to support groups, or work with a counselor.  Ask people who smoke to not smoke around you.  Avoid places that make you want to smoke,   such as: ? Bars. ? Parties. ? Smoke-break areas at work.  Spend time with people who do not smoke.  Lower the stress in your life. Stress can make you want to smoke. Try these things to help your stress: ? Getting regular exercise. ? Doing deep-breathing exercises. ? Doing yoga. ? Meditating. ? Doing a body scan. To do this, close your eyes, focus on one area of your body at a time from head to toe. Notice which parts of your body are tense. Try to relax the muscles in those areas. How will I feel when I quit smoking? Day 1 to 3 weeks Within the first 24 hours,  you may start to have some problems that come from quitting tobacco. These problems are very bad 2-3 days after you quit, but they do not often last for more than 2-3 weeks. You may get these symptoms:  Mood swings.  Feeling restless, nervous, angry, or annoyed.  Trouble concentrating.  Dizziness.  Strong desire for high-sugar foods and nicotine.  Weight gain.  Trouble pooping (constipation).  Feeling like you may vomit (nausea).  Coughing or a sore throat.  Changes in how the medicines that you take for other issues work in your body.  Depression.  Trouble sleeping (insomnia). Week 3 and afterward After the first 2-3 weeks of quitting, you may start to notice more positive results, such as:  Better sense of smell and taste.  Less coughing and sore throat.  Slower heart rate.  Lower blood pressure.  Clearer skin.  Better breathing.  Fewer sick days. Quitting smoking can be hard. Do not give up if you fail the first time. Some people need to try a few times before they succeed. Do your best to stick to your quit plan, and talk with your doctor if you have any questions or concerns. Summary  Smoking tobacco is the leading cause of preventable death. Quitting smoking can be hard, but it is one of the best things that you can do for your health.  When you decide to quit smoking, make a plan to help you succeed.  Quit smoking right away, not slowly over a period of time.  When you start quitting, seek help from your doctor, family, or friends. This information is not intended to replace advice given to you by your health care provider. Make sure you discuss any questions you have with your health care provider. Document Revised: 04/15/2019 Document Reviewed: 10/09/2018 Elsevier Patient Education  2020 Elsevier Inc.  

## 2020-03-09 ENCOUNTER — Telehealth: Payer: Self-pay | Admitting: Internal Medicine

## 2020-03-09 NOTE — Telephone Encounter (Signed)
Scheduled per los. Called and left msg. Mailed printout  °

## 2020-03-29 ENCOUNTER — Inpatient Hospital Stay: Payer: Medicare Other

## 2020-03-29 ENCOUNTER — Other Ambulatory Visit: Payer: Self-pay

## 2020-03-29 ENCOUNTER — Inpatient Hospital Stay (HOSPITAL_BASED_OUTPATIENT_CLINIC_OR_DEPARTMENT_OTHER): Payer: Medicare Other | Admitting: Internal Medicine

## 2020-03-29 ENCOUNTER — Encounter: Payer: Self-pay | Admitting: Internal Medicine

## 2020-03-29 VITALS — BP 107/74 | HR 72 | Temp 97.7°F | Resp 17 | Ht 68.0 in | Wt 151.7 lb

## 2020-03-29 DIAGNOSIS — C3492 Malignant neoplasm of unspecified part of left bronchus or lung: Secondary | ICD-10-CM

## 2020-03-29 DIAGNOSIS — C3412 Malignant neoplasm of upper lobe, left bronchus or lung: Secondary | ICD-10-CM | POA: Diagnosis not present

## 2020-03-29 DIAGNOSIS — Z5112 Encounter for antineoplastic immunotherapy: Secondary | ICD-10-CM

## 2020-03-29 DIAGNOSIS — Z5111 Encounter for antineoplastic chemotherapy: Secondary | ICD-10-CM

## 2020-03-29 DIAGNOSIS — C801 Malignant (primary) neoplasm, unspecified: Secondary | ICD-10-CM | POA: Diagnosis not present

## 2020-03-29 DIAGNOSIS — Z95828 Presence of other vascular implants and grafts: Secondary | ICD-10-CM

## 2020-03-29 DIAGNOSIS — C3490 Malignant neoplasm of unspecified part of unspecified bronchus or lung: Secondary | ICD-10-CM

## 2020-03-29 LAB — TSH: TSH: 0.258 u[IU]/mL — ABNORMAL LOW (ref 0.320–4.118)

## 2020-03-29 LAB — CMP (CANCER CENTER ONLY)
ALT: <6 U/L (ref 0–44)
AST: 9 U/L — ABNORMAL LOW (ref 15–41)
Albumin: 3.2 g/dL — ABNORMAL LOW (ref 3.5–5.0)
Alkaline Phosphatase: 71 U/L (ref 38–126)
Anion gap: 5 (ref 5–15)
BUN: 8 mg/dL (ref 8–23)
CO2: 24 mmol/L (ref 22–32)
Calcium: 9.3 mg/dL (ref 8.9–10.3)
Chloride: 107 mmol/L (ref 98–111)
Creatinine: 0.8 mg/dL (ref 0.61–1.24)
GFR, Est AFR Am: >60 mL/min (ref >60)
GFR, Estimated: >60 mL/min (ref >60)
Glucose, Bld: 221 mg/dL — ABNORMAL HIGH (ref 70–99)
Potassium: 4 mmol/L (ref 3.5–5.1)
Sodium: 136 mmol/L (ref 135–145)
Total Bilirubin: 0.2 mg/dL — ABNORMAL LOW (ref 0.3–1.2)
Total Protein: 6.3 g/dL — ABNORMAL LOW (ref 6.5–8.1)

## 2020-03-29 LAB — CBC WITH DIFFERENTIAL (CANCER CENTER ONLY)
Abs Immature Granulocytes: 0.13 10*3/uL — ABNORMAL HIGH (ref 0.00–0.07)
Basophils Absolute: 0.1 10*3/uL (ref 0.0–0.1)
Basophils Relative: 1 %
Eosinophils Absolute: 0.2 10*3/uL (ref 0.0–0.5)
Eosinophils Relative: 3 %
HCT: 27.9 % — ABNORMAL LOW (ref 39.0–52.0)
Hemoglobin: 9.3 g/dL — ABNORMAL LOW (ref 13.0–17.0)
Immature Granulocytes: 1 %
Lymphocytes Relative: 17 %
Lymphs Abs: 1.5 10*3/uL (ref 0.7–4.0)
MCH: 36.2 pg — ABNORMAL HIGH (ref 26.0–34.0)
MCHC: 33.3 g/dL (ref 30.0–36.0)
MCV: 108.6 fL — ABNORMAL HIGH (ref 80.0–100.0)
Monocytes Absolute: 0.9 10*3/uL (ref 0.1–1.0)
Monocytes Relative: 10 %
Neutro Abs: 6.2 10*3/uL (ref 1.7–7.7)
Neutrophils Relative %: 68 %
Platelet Count: 300 10*3/uL (ref 150–400)
RBC: 2.57 MIL/uL — ABNORMAL LOW (ref 4.22–5.81)
RDW: 18.5 % — ABNORMAL HIGH (ref 11.5–15.5)
WBC Count: 9 10*3/uL (ref 4.0–10.5)
nRBC: 0 % (ref 0.0–0.2)

## 2020-03-29 MED ORDER — SODIUM CHLORIDE 0.9 % IV SOLN
500.0000 mg/m2 | Freq: Once | INTRAVENOUS | Status: AC
  Administered 2020-03-29: 900 mg via INTRAVENOUS
  Filled 2020-03-29: qty 20

## 2020-03-29 MED ORDER — HEPARIN SOD (PORK) LOCK FLUSH 100 UNIT/ML IV SOLN
500.0000 [IU] | Freq: Once | INTRAVENOUS | Status: AC | PRN
  Administered 2020-03-29: 500 [IU]
  Filled 2020-03-29: qty 5

## 2020-03-29 MED ORDER — PROCHLORPERAZINE MALEATE 10 MG PO TABS
ORAL_TABLET | ORAL | Status: AC
  Filled 2020-03-29: qty 1

## 2020-03-29 MED ORDER — HEPARIN SOD (PORK) LOCK FLUSH 100 UNIT/ML IV SOLN
500.0000 [IU] | Freq: Once | INTRAVENOUS | Status: DC
  Filled 2020-03-29: qty 5

## 2020-03-29 MED ORDER — PROCHLORPERAZINE MALEATE 10 MG PO TABS
10.0000 mg | ORAL_TABLET | Freq: Once | ORAL | Status: AC
  Administered 2020-03-29: 10 mg via ORAL

## 2020-03-29 MED ORDER — SODIUM CHLORIDE 0.9% FLUSH
10.0000 mL | Freq: Once | INTRAVENOUS | Status: AC
  Administered 2020-03-29: 10 mL
  Filled 2020-03-29: qty 10

## 2020-03-29 MED ORDER — SODIUM CHLORIDE 0.9 % IV SOLN
Freq: Once | INTRAVENOUS | Status: AC
  Filled 2020-03-29: qty 250

## 2020-03-29 MED ORDER — CYANOCOBALAMIN 1000 MCG/ML IJ SOLN
INTRAMUSCULAR | Status: AC
  Filled 2020-03-29: qty 1

## 2020-03-29 MED ORDER — CYANOCOBALAMIN 1000 MCG/ML IJ SOLN
1000.0000 ug | Freq: Once | INTRAMUSCULAR | Status: AC
  Administered 2020-03-29: 1000 ug via INTRAMUSCULAR

## 2020-03-29 MED ORDER — SODIUM CHLORIDE 0.9% FLUSH
10.0000 mL | INTRAVENOUS | Status: DC | PRN
  Administered 2020-03-29: 10 mL
  Filled 2020-03-29: qty 10

## 2020-03-29 MED ORDER — SODIUM CHLORIDE 0.9 % IV SOLN
200.0000 mg | Freq: Once | INTRAVENOUS | Status: AC
  Administered 2020-03-29: 200 mg via INTRAVENOUS
  Filled 2020-03-29: qty 8
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
## 2020-03-29 NOTE — Patient Instructions (Signed)

## 2020-04-17 ENCOUNTER — Encounter (HOSPITAL_COMMUNITY): Payer: Self-pay

## 2020-04-17 ENCOUNTER — Other Ambulatory Visit: Payer: Self-pay

## 2020-04-17 ENCOUNTER — Inpatient Hospital Stay: Payer: Medicare Other | Attending: Internal Medicine

## 2020-04-17 ENCOUNTER — Ambulatory Visit (HOSPITAL_COMMUNITY)
Admission: RE | Admit: 2020-04-17 | Discharge: 2020-04-17 | Disposition: A | Discharge status: Home / Self Care | Payer: Medicare Other | Source: Ambulatory Visit | Attending: Internal Medicine | Admitting: Internal Medicine

## 2020-04-17 DIAGNOSIS — D649 Anemia, unspecified: Secondary | ICD-10-CM | POA: Insufficient documentation

## 2020-04-17 DIAGNOSIS — Z79899 Other long term (current) drug therapy: Secondary | ICD-10-CM | POA: Insufficient documentation

## 2020-04-17 DIAGNOSIS — C3412 Malignant neoplasm of upper lobe, left bronchus or lung: Secondary | ICD-10-CM | POA: Insufficient documentation

## 2020-04-17 DIAGNOSIS — Z8589 Personal history of malignant neoplasm of other organs and systems: Secondary | ICD-10-CM | POA: Diagnosis not present

## 2020-04-17 DIAGNOSIS — C3492 Malignant neoplasm of unspecified part of left bronchus or lung: Secondary | ICD-10-CM

## 2020-04-17 DIAGNOSIS — C3490 Malignant neoplasm of unspecified part of unspecified bronchus or lung: Secondary | ICD-10-CM | POA: Diagnosis present

## 2020-04-17 DIAGNOSIS — Z5112 Encounter for antineoplastic immunotherapy: Secondary | ICD-10-CM | POA: Diagnosis present

## 2020-04-17 DIAGNOSIS — Z95828 Presence of other vascular implants and grafts: Secondary | ICD-10-CM

## 2020-04-17 MED ORDER — SODIUM CHLORIDE 0.9% FLUSH
10.0000 mL | Freq: Once | INTRAVENOUS | Status: AC
  Administered 2020-04-17: 10 mL
  Filled 2020-04-17: qty 10

## 2020-04-17 MED ORDER — IOHEXOL 300 MG/ML  SOLN
100.0000 mL | Freq: Once | INTRAMUSCULAR | Status: AC | PRN
  Administered 2020-04-17: 100 mL via INTRAVENOUS

## 2020-04-17 MED ORDER — HEPARIN SOD (PORK) LOCK FLUSH 100 UNIT/ML IV SOLN
INTRAVENOUS | Status: AC
  Filled 2020-04-17: qty 5

## 2020-04-17 MED ORDER — HEPARIN SOD (PORK) LOCK FLUSH 100 UNIT/ML IV SOLN
500.0000 [IU] | Freq: Once | INTRAVENOUS | Status: AC
  Administered 2020-04-17: 500 [IU] via INTRAVENOUS

## 2020-04-17 NOTE — Patient Instructions (Signed)

## 2020-04-19 ENCOUNTER — Inpatient Hospital Stay: Payer: Medicare Other

## 2020-04-19 ENCOUNTER — Inpatient Hospital Stay (HOSPITAL_BASED_OUTPATIENT_CLINIC_OR_DEPARTMENT_OTHER): Payer: Medicare Other | Admitting: Internal Medicine

## 2020-04-19 ENCOUNTER — Encounter: Payer: Self-pay | Admitting: Internal Medicine

## 2020-04-19 ENCOUNTER — Other Ambulatory Visit: Payer: Self-pay

## 2020-04-19 VITALS — BP 102/58 | HR 97 | Temp 97.8°F | Resp 17 | Ht 68.0 in | Wt 147.2 lb

## 2020-04-19 DIAGNOSIS — C3492 Malignant neoplasm of unspecified part of left bronchus or lung: Secondary | ICD-10-CM

## 2020-04-19 DIAGNOSIS — C3412 Malignant neoplasm of upper lobe, left bronchus or lung: Secondary | ICD-10-CM | POA: Diagnosis not present

## 2020-04-19 DIAGNOSIS — Z95828 Presence of other vascular implants and grafts: Secondary | ICD-10-CM

## 2020-04-19 DIAGNOSIS — Z5112 Encounter for antineoplastic immunotherapy: Secondary | ICD-10-CM | POA: Diagnosis not present

## 2020-04-19 LAB — TSH: TSH: 1.181 u[IU]/mL (ref 0.320–4.118)

## 2020-04-19 LAB — CMP (CANCER CENTER ONLY)
ALT: 10 U/L (ref 0–44)
AST: 16 U/L (ref 15–41)
Albumin: 3.6 g/dL (ref 3.5–5.0)
Alkaline Phosphatase: 90 U/L (ref 38–126)
Anion gap: 7 (ref 5–15)
BUN: 6 mg/dL — ABNORMAL LOW (ref 8–23)
CO2: 25 mmol/L (ref 22–32)
Calcium: 9.2 mg/dL (ref 8.9–10.3)
Chloride: 107 mmol/L (ref 98–111)
Creatinine: 0.72 mg/dL (ref 0.61–1.24)
GFR, Est AFR Am: >60 mL/min (ref >60)
GFR, Estimated: >60 mL/min (ref >60)
Glucose, Bld: 110 mg/dL — ABNORMAL HIGH (ref 70–99)
Potassium: 3.9 mmol/L (ref 3.5–5.1)
Sodium: 139 mmol/L (ref 135–145)
Total Bilirubin: 0.3 mg/dL (ref 0.3–1.2)
Total Protein: 6.8 g/dL (ref 6.5–8.1)

## 2020-04-19 LAB — CBC WITH DIFFERENTIAL (CANCER CENTER ONLY)
Abs Immature Granulocytes: 0.05 10*3/uL (ref 0.00–0.07)
Basophils Absolute: 0.1 10*3/uL (ref 0.0–0.1)
Basophils Relative: 1 %
Eosinophils Absolute: 0.3 10*3/uL (ref 0.0–0.5)
Eosinophils Relative: 3 %
HCT: 31.2 % — ABNORMAL LOW (ref 39.0–52.0)
Hemoglobin: 10.5 g/dL — ABNORMAL LOW (ref 13.0–17.0)
Immature Granulocytes: 1 %
Lymphocytes Relative: 18 %
Lymphs Abs: 1.5 10*3/uL (ref 0.7–4.0)
MCH: 37.2 pg — ABNORMAL HIGH (ref 26.0–34.0)
MCHC: 33.7 g/dL (ref 30.0–36.0)
MCV: 110.6 fL — ABNORMAL HIGH (ref 80.0–100.0)
Monocytes Absolute: 1 10*3/uL (ref 0.1–1.0)
Monocytes Relative: 12 %
Neutro Abs: 5.7 10*3/uL (ref 1.7–7.7)
Neutrophils Relative %: 65 %
Platelet Count: 235 10*3/uL (ref 150–400)
RBC: 2.82 MIL/uL — ABNORMAL LOW (ref 4.22–5.81)
RDW: 15.2 % (ref 11.5–15.5)
WBC Count: 8.6 10*3/uL (ref 4.0–10.5)
nRBC: 0 % (ref 0.0–0.2)

## 2020-04-19 MED ORDER — SODIUM CHLORIDE 0.9 % IV SOLN
Freq: Once | INTRAVENOUS | Status: AC
  Filled 2020-04-19: qty 250

## 2020-04-19 MED ORDER — SODIUM CHLORIDE 0.9% FLUSH
10.0000 mL | INTRAVENOUS | Status: DC | PRN
  Filled 2020-04-19: qty 10

## 2020-04-19 MED ORDER — SODIUM CHLORIDE 0.9 % IV SOLN
200.0000 mg | Freq: Once | INTRAVENOUS | Status: AC
  Administered 2020-04-19: 200 mg via INTRAVENOUS
  Filled 2020-04-19: qty 8

## 2020-04-19 MED ORDER — HEPARIN SOD (PORK) LOCK FLUSH 100 UNIT/ML IV SOLN
500.0000 [IU] | Freq: Once | INTRAVENOUS | Status: DC | PRN
  Filled 2020-04-19: qty 5

## 2020-04-19 MED ORDER — SODIUM CHLORIDE 0.9% FLUSH
10.0000 mL | Freq: Once | INTRAVENOUS | Status: AC
  Administered 2020-04-19: 10 mL
  Filled 2020-04-19: qty 10
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
## 2020-04-19 NOTE — Patient Instructions (Signed)
[facility] Discharge Instructions for Patients Receiving Chemotherapy  Today you received the following chemotherapy agents:  Keytruda.  To help prevent nausea and vomiting after your treatment, we encourage you to take your nausea medication as directed.   If you develop nausea and vomiting that is not controlled by your nausea medication, call the clinic.   BELOW ARE SYMPTOMS THAT SHOULD BE REPORTED IMMEDIATELY:  *FEVER GREATER THAN 100.5 F  *CHILLS WITH OR WITHOUT FEVER  NAUSEA AND VOMITING THAT IS NOT CONTROLLED WITH YOUR NAUSEA MEDICATION  *UNUSUAL SHORTNESS OF BREATH  *UNUSUAL BRUISING OR BLEEDING  TENDERNESS IN MOUTH AND THROAT WITH OR WITHOUT PRESENCE OF ULCERS  *URINARY PROBLEMS  *BOWEL PROBLEMS  UNUSUAL RASH Items with * indicate a potential emergency and should be followed up as soon as possible.  Feel free to call the clinic should you have any questions or concerns. The clinic phone number is [phone].  Please show the CHEMO ALERT CARD at check-in to the Emergency Department and triage nurse.    

## 2020-04-20 ENCOUNTER — Telehealth: Payer: Self-pay | Admitting: Internal Medicine

## 2020-04-20 NOTE — Telephone Encounter (Signed)
Scheduled per los. Called and spoke with patients wife. Confirmed appts 

## 2020-04-23 ENCOUNTER — Telehealth: Payer: Self-pay | Admitting: Medical Oncology

## 2020-04-23 NOTE — Telephone Encounter (Addendum)
Pain and discomfort  at port a cath site since infusion last thursday. He reports that he experiences pain in his R chest when he coughs  and the pain radiates to under his arm and up toward neck. It aches all the time , but he only has sharp pains when he takes a deep breath.  Appt scheduled for tomorrow in [facility].

## 2020-04-24 ENCOUNTER — Other Ambulatory Visit: Payer: Self-pay

## 2020-04-24 ENCOUNTER — Ambulatory Visit (HOSPITAL_COMMUNITY)
Admission: RE | Admit: 2020-04-24 | Discharge: 2020-04-24 | Disposition: A | Discharge status: Home / Self Care | Payer: Medicare Other | Source: Ambulatory Visit | Attending: Medical | Admitting: Medical

## 2020-04-24 ENCOUNTER — Inpatient Hospital Stay (HOSPITAL_BASED_OUTPATIENT_CLINIC_OR_DEPARTMENT_OTHER): Payer: Medicare Other | Admitting: Medical

## 2020-04-24 ENCOUNTER — Telehealth: Payer: Self-pay | Admitting: Medical Oncology

## 2020-04-24 VITALS — BP 126/79 | HR 90 | Temp 98.2°F | Resp 20 | Ht 68.0 in | Wt 154.7 lb

## 2020-04-24 DIAGNOSIS — R221 Localized swelling, mass and lump, neck: Secondary | ICD-10-CM | POA: Diagnosis present

## 2020-04-24 DIAGNOSIS — R071 Chest pain on breathing: Secondary | ICD-10-CM | POA: Diagnosis not present

## 2020-04-24 DIAGNOSIS — I82C11 Acute embolism and thrombosis of right internal jugular vein: Secondary | ICD-10-CM

## 2020-04-24 DIAGNOSIS — C3492 Malignant neoplasm of unspecified part of left bronchus or lung: Secondary | ICD-10-CM | POA: Diagnosis not present

## 2020-04-24 DIAGNOSIS — Z5112 Encounter for antineoplastic immunotherapy: Secondary | ICD-10-CM | POA: Diagnosis not present

## 2020-04-24 MED ORDER — APIXABAN (ELIQUIS) VTE STARTER PACK (10MG AND 5MG): ORAL_TABLET | ORAL | 0 refills | Status: DC

## 2020-04-24 NOTE — Telephone Encounter (Signed)
Acute embolism R IJ.  I instructed wife on Eloquis precautions and if pt has increasing chest pain , SOB, dizzy ,headache to go to ED . Wife states she was instructed to stop Megace.

## 2020-04-24 NOTE — Progress Notes (Signed)
These preliminary result these preliminary results were noted.  Awaiting final report.
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
## 2020-05-10 ENCOUNTER — Inpatient Hospital Stay: Payer: Medicare Other

## 2020-05-10 ENCOUNTER — Other Ambulatory Visit: Payer: Self-pay

## 2020-05-10 ENCOUNTER — Inpatient Hospital Stay: Payer: Medicare Other | Attending: Internal Medicine | Admitting: Internal Medicine

## 2020-05-10 ENCOUNTER — Encounter: Payer: Self-pay | Admitting: Internal Medicine

## 2020-05-10 VITALS — BP 102/72 | HR 85 | Temp 97.0°F | Resp 18 | Ht 68.0 in | Wt 151.2 lb

## 2020-05-10 DIAGNOSIS — Z95828 Presence of other vascular implants and grafts: Secondary | ICD-10-CM

## 2020-05-10 DIAGNOSIS — C3412 Malignant neoplasm of upper lobe, left bronchus or lung: Secondary | ICD-10-CM | POA: Diagnosis not present

## 2020-05-10 DIAGNOSIS — C3492 Malignant neoplasm of unspecified part of left bronchus or lung: Secondary | ICD-10-CM

## 2020-05-10 DIAGNOSIS — Z5112 Encounter for antineoplastic immunotherapy: Secondary | ICD-10-CM | POA: Diagnosis present

## 2020-05-10 DIAGNOSIS — C771 Secondary and unspecified malignant neoplasm of intrathoracic lymph nodes: Secondary | ICD-10-CM | POA: Insufficient documentation

## 2020-05-10 DIAGNOSIS — Z79899 Other long term (current) drug therapy: Secondary | ICD-10-CM | POA: Diagnosis not present

## 2020-05-10 DIAGNOSIS — D649 Anemia, unspecified: Secondary | ICD-10-CM | POA: Insufficient documentation

## 2020-05-10 LAB — CBC WITH DIFFERENTIAL (CANCER CENTER ONLY)
Abs Immature Granulocytes: 0.04 10*3/uL (ref 0.00–0.07)
Basophils Absolute: 0.1 10*3/uL (ref 0.0–0.1)
Basophils Relative: 1 %
Eosinophils Absolute: 0.3 10*3/uL (ref 0.0–0.5)
Eosinophils Relative: 4 %
HCT: 27.6 % — ABNORMAL LOW (ref 39.0–52.0)
Hemoglobin: 9.2 g/dL — ABNORMAL LOW (ref 13.0–17.0)
Immature Granulocytes: 1 %
Lymphocytes Relative: 11 %
Lymphs Abs: 0.9 10*3/uL (ref 0.7–4.0)
MCH: 35.7 pg — ABNORMAL HIGH (ref 26.0–34.0)
MCHC: 33.3 g/dL (ref 30.0–36.0)
MCV: 107 fL — ABNORMAL HIGH (ref 80.0–100.0)
Monocytes Absolute: 0.7 10*3/uL (ref 0.1–1.0)
Monocytes Relative: 9 %
Neutro Abs: 5.8 10*3/uL (ref 1.7–7.7)
Neutrophils Relative %: 74 %
Platelet Count: 267 10*3/uL (ref 150–400)
RBC: 2.58 MIL/uL — ABNORMAL LOW (ref 4.22–5.81)
RDW: 13.6 % (ref 11.5–15.5)
WBC Count: 7.8 10*3/uL (ref 4.0–10.5)
nRBC: 0 % (ref 0.0–0.2)

## 2020-05-10 LAB — CMP (CANCER CENTER ONLY)
ALT: 9 U/L (ref 0–44)
AST: 14 U/L — ABNORMAL LOW (ref 15–41)
Albumin: 3 g/dL — ABNORMAL LOW (ref 3.5–5.0)
Alkaline Phosphatase: 77 U/L (ref 38–126)
Anion gap: 6 (ref 5–15)
BUN: 7 mg/dL — ABNORMAL LOW (ref 8–23)
CO2: 24 mmol/L (ref 22–32)
Calcium: 9 mg/dL (ref 8.9–10.3)
Chloride: 107 mmol/L (ref 98–111)
Creatinine: 0.67 mg/dL (ref 0.61–1.24)
GFR, Estimated: >60 mL/min (ref >60)
Glucose, Bld: 156 mg/dL — ABNORMAL HIGH (ref 70–99)
Potassium: 3.5 mmol/L (ref 3.5–5.1)
Sodium: 137 mmol/L (ref 135–145)
Total Bilirubin: <0.2 mg/dL — ABNORMAL LOW (ref 0.3–1.2)
Total Protein: 6.5 g/dL (ref 6.5–8.1)

## 2020-05-10 LAB — TSH: TSH: 0.697 u[IU]/mL (ref 0.320–4.118)

## 2020-05-10 MED ORDER — SODIUM CHLORIDE 0.9% FLUSH
10.0000 mL | Freq: Once | INTRAVENOUS | Status: AC
  Administered 2020-05-10: 10 mL
  Filled 2020-05-10: qty 10

## 2020-05-10 MED ORDER — SODIUM CHLORIDE 0.9 % IV SOLN
200.0000 mg | Freq: Once | INTRAVENOUS | Status: AC
  Administered 2020-05-10: 200 mg via INTRAVENOUS
  Filled 2020-05-10: qty 8

## 2020-05-10 MED ORDER — SODIUM CHLORIDE 0.9 % IV SOLN
Freq: Once | INTRAVENOUS | Status: AC
  Filled 2020-05-10: qty 250

## 2020-05-10 MED ORDER — SODIUM CHLORIDE 0.9% FLUSH
10.0000 mL | INTRAVENOUS | Status: DC | PRN
  Administered 2020-05-10: 10 mL
  Filled 2020-05-10: qty 10

## 2020-05-10 MED ORDER — HEPARIN SOD (PORK) LOCK FLUSH 100 UNIT/ML IV SOLN
500.0000 [IU] | Freq: Once | INTRAVENOUS | Status: AC | PRN
  Administered 2020-05-10: 500 [IU]
  Filled 2020-05-10: qty 5

## 2020-05-10 NOTE — Patient Instructions (Signed)
[facility] Discharge Instructions for Patients Receiving Chemotherapy  Today you received the following chemotherapy agents:  Keytruda.  To help prevent nausea and vomiting after your treatment, we encourage you to take your nausea medication as directed.   If you develop nausea and vomiting that is not controlled by your nausea medication, call the clinic.   BELOW ARE SYMPTOMS THAT SHOULD BE REPORTED IMMEDIATELY:  *FEVER GREATER THAN 100.5 F  *CHILLS WITH OR WITHOUT FEVER  NAUSEA AND VOMITING THAT IS NOT CONTROLLED WITH YOUR NAUSEA MEDICATION  *UNUSUAL SHORTNESS OF BREATH  *UNUSUAL BRUISING OR BLEEDING  TENDERNESS IN MOUTH AND THROAT WITH OR WITHOUT PRESENCE OF ULCERS  *URINARY PROBLEMS  *BOWEL PROBLEMS  UNUSUAL RASH Items with * indicate a potential emergency and should be followed up as soon as possible.  Feel free to call the clinic should you have any questions or concerns. The clinic phone number is [phone].  Please show the CHEMO ALERT CARD at check-in to the Emergency Department and triage nurse.    
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
## 2020-05-19 ENCOUNTER — Other Ambulatory Visit: Payer: Self-pay | Admitting: Medical

## 2020-05-19 DIAGNOSIS — I82C11 Acute embolism and thrombosis of right internal jugular vein: Secondary | ICD-10-CM

## 2020-05-22 ENCOUNTER — Other Ambulatory Visit: Payer: Self-pay | Admitting: Physician Assistant

## 2020-05-22 DIAGNOSIS — I8290 Acute embolism and thrombosis of unspecified vein: Secondary | ICD-10-CM

## 2020-05-22 MED ORDER — APIXABAN 5 MG PO TABS: 5.0000 mg | ORAL_TABLET | Freq: Two times a day (BID) | ORAL | 2 refills | Status: DC

## 2020-05-22 NOTE — Telephone Encounter (Signed)
Cassie, This came in to [facility] but sending to you for refusal or approval since it is a [facility] patient. Gardiner Rhyme, RN
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
## 2020-05-30 ENCOUNTER — Other Ambulatory Visit: Payer: Self-pay | Admitting: Internal Medicine

## 2020-05-31 ENCOUNTER — Inpatient Hospital Stay: Payer: Medicare Other

## 2020-05-31 ENCOUNTER — Other Ambulatory Visit: Payer: Medicare Other

## 2020-05-31 ENCOUNTER — Encounter: Payer: Self-pay | Admitting: Physician Assistant

## 2020-05-31 ENCOUNTER — Other Ambulatory Visit: Payer: Self-pay

## 2020-05-31 ENCOUNTER — Ambulatory Visit: Payer: Medicare Other

## 2020-05-31 ENCOUNTER — Inpatient Hospital Stay (HOSPITAL_BASED_OUTPATIENT_CLINIC_OR_DEPARTMENT_OTHER): Payer: Medicare Other | Admitting: Physician Assistant

## 2020-05-31 ENCOUNTER — Ambulatory Visit: Payer: Medicare Other | Admitting: Internal Medicine

## 2020-05-31 VITALS — BP 96/68 | HR 85 | Temp 97.2°F | Resp 18 | Ht 68.0 in | Wt 152.9 lb

## 2020-05-31 DIAGNOSIS — C3492 Malignant neoplasm of unspecified part of left bronchus or lung: Secondary | ICD-10-CM

## 2020-05-31 DIAGNOSIS — Z95828 Presence of other vascular implants and grafts: Secondary | ICD-10-CM

## 2020-05-31 DIAGNOSIS — C3412 Malignant neoplasm of upper lobe, left bronchus or lung: Secondary | ICD-10-CM

## 2020-05-31 DIAGNOSIS — I8290 Acute embolism and thrombosis of unspecified vein: Secondary | ICD-10-CM

## 2020-05-31 DIAGNOSIS — Z5112 Encounter for antineoplastic immunotherapy: Secondary | ICD-10-CM | POA: Diagnosis not present

## 2020-05-31 DIAGNOSIS — I82409 Acute embolism and thrombosis of unspecified deep veins of unspecified lower extremity: Secondary | ICD-10-CM | POA: Insufficient documentation

## 2020-05-31 LAB — CBC WITH DIFFERENTIAL (CANCER CENTER ONLY)
Abs Immature Granulocytes: 0.03 10*3/uL (ref 0.00–0.07)
Basophils Absolute: 0.1 10*3/uL (ref 0.0–0.1)
Basophils Relative: 1 %
Eosinophils Absolute: 0.3 10*3/uL (ref 0.0–0.5)
Eosinophils Relative: 4 %
HCT: 30.6 % — ABNORMAL LOW (ref 39.0–52.0)
Hemoglobin: 10.2 g/dL — ABNORMAL LOW (ref 13.0–17.0)
Immature Granulocytes: 0 %
Lymphocytes Relative: 19 %
Lymphs Abs: 1.5 10*3/uL (ref 0.7–4.0)
MCH: 34.2 pg — ABNORMAL HIGH (ref 26.0–34.0)
MCHC: 33.3 g/dL (ref 30.0–36.0)
MCV: 102.7 fL — ABNORMAL HIGH (ref 80.0–100.0)
Monocytes Absolute: 0.6 10*3/uL (ref 0.1–1.0)
Monocytes Relative: 8 %
Neutro Abs: 5.5 10*3/uL (ref 1.7–7.7)
Neutrophils Relative %: 68 %
Platelet Count: 285 10*3/uL (ref 150–400)
RBC: 2.98 MIL/uL — ABNORMAL LOW (ref 4.22–5.81)
RDW: 13.8 % (ref 11.5–15.5)
WBC Count: 8.1 10*3/uL (ref 4.0–10.5)
nRBC: 0 % (ref 0.0–0.2)

## 2020-05-31 LAB — CMP (CANCER CENTER ONLY)
ALT: <6 U/L (ref 0–44)
AST: 10 U/L — ABNORMAL LOW (ref 15–41)
Albumin: 3.2 g/dL — ABNORMAL LOW (ref 3.5–5.0)
Alkaline Phosphatase: 75 U/L (ref 38–126)
Anion gap: 10 (ref 5–15)
BUN: 8 mg/dL (ref 8–23)
CO2: 25 mmol/L (ref 22–32)
Calcium: 8.8 mg/dL — ABNORMAL LOW (ref 8.9–10.3)
Chloride: 101 mmol/L (ref 98–111)
Creatinine: 0.76 mg/dL (ref 0.61–1.24)
GFR, Estimated: >60 mL/min (ref >60)
Glucose, Bld: 221 mg/dL — ABNORMAL HIGH (ref 70–99)
Potassium: 3.5 mmol/L (ref 3.5–5.1)
Sodium: 136 mmol/L (ref 135–145)
Total Bilirubin: <0.2 mg/dL — ABNORMAL LOW (ref 0.3–1.2)
Total Protein: 6.3 g/dL — ABNORMAL LOW (ref 6.5–8.1)

## 2020-05-31 LAB — TSH: TSH: 0.784 u[IU]/mL (ref 0.320–4.118)

## 2020-05-31 MED ORDER — APIXABAN 5 MG PO TABS: 5.0000 mg | ORAL_TABLET | Freq: Two times a day (BID) | ORAL | 2 refills | Status: DC

## 2020-05-31 MED ORDER — HEPARIN SOD (PORK) LOCK FLUSH 100 UNIT/ML IV SOLN
500.0000 [IU] | Freq: Once | INTRAVENOUS | Status: AC | PRN
  Administered 2020-05-31: 500 [IU]
  Filled 2020-05-31: qty 5

## 2020-05-31 MED ORDER — SODIUM CHLORIDE 0.9% FLUSH
10.0000 mL | Freq: Once | INTRAVENOUS | Status: AC
  Administered 2020-05-31: 10 mL
  Filled 2020-05-31: qty 10

## 2020-05-31 MED ORDER — SODIUM CHLORIDE 0.9 % IV SOLN
200.0000 mg | Freq: Once | INTRAVENOUS | Status: AC
  Administered 2020-05-31: 200 mg via INTRAVENOUS
  Filled 2020-05-31: qty 8

## 2020-05-31 MED ORDER — SODIUM CHLORIDE 0.9 % IV SOLN
Freq: Once | INTRAVENOUS | Status: AC
  Filled 2020-05-31: qty 250

## 2020-05-31 MED ORDER — SODIUM CHLORIDE 0.9% FLUSH
10.0000 mL | INTRAVENOUS | Status: DC | PRN
  Administered 2020-05-31: 10 mL
  Filled 2020-05-31: qty 10

## 2020-05-31 NOTE — Patient Instructions (Signed)
[facility] Discharge Instructions for Patients Receiving Chemotherapy  Today you received the following chemotherapy agents: pembrolizumab.  To help prevent nausea and vomiting after your treatment, we encourage you to take your nausea medication as directed.   If you develop nausea and vomiting that is not controlled by your nausea medication, call the clinic.   BELOW ARE SYMPTOMS THAT SHOULD BE REPORTED IMMEDIATELY:  *FEVER GREATER THAN 100.5 F  *CHILLS WITH OR WITHOUT FEVER  NAUSEA AND VOMITING THAT IS NOT CONTROLLED WITH YOUR NAUSEA MEDICATION  *UNUSUAL SHORTNESS OF BREATH  *UNUSUAL BRUISING OR BLEEDING  TENDERNESS IN MOUTH AND THROAT WITH OR WITHOUT PRESENCE OF ULCERS  *URINARY PROBLEMS  *BOWEL PROBLEMS  UNUSUAL RASH Items with * indicate a potential emergency and should be followed up as soon as possible.  Feel free to call the clinic should you have any questions or concerns. The clinic phone number is [phone].  Please show the CHEMO ALERT CARD at check-in to the Emergency Department and triage nurse.   

## 2020-06-18 ENCOUNTER — Other Ambulatory Visit: Payer: Self-pay

## 2020-06-18 ENCOUNTER — Encounter (HOSPITAL_COMMUNITY): Payer: Self-pay

## 2020-06-18 ENCOUNTER — Ambulatory Visit (HOSPITAL_COMMUNITY)
Admission: RE | Admit: 2020-06-18 | Discharge: 2020-06-18 | Disposition: A | Discharge status: Home / Self Care | Payer: Medicare Other | Source: Ambulatory Visit | Attending: Physician Assistant | Admitting: Physician Assistant

## 2020-06-18 ENCOUNTER — Inpatient Hospital Stay: Payer: Medicare Other | Attending: Internal Medicine

## 2020-06-18 DIAGNOSIS — Z5112 Encounter for antineoplastic immunotherapy: Secondary | ICD-10-CM | POA: Diagnosis present

## 2020-06-18 DIAGNOSIS — C3492 Malignant neoplasm of unspecified part of left bronchus or lung: Secondary | ICD-10-CM

## 2020-06-18 DIAGNOSIS — C3412 Malignant neoplasm of upper lobe, left bronchus or lung: Secondary | ICD-10-CM | POA: Diagnosis present

## 2020-06-18 DIAGNOSIS — Z79899 Other long term (current) drug therapy: Secondary | ICD-10-CM | POA: Insufficient documentation

## 2020-06-18 DIAGNOSIS — C7989 Secondary malignant neoplasm of other specified sites: Secondary | ICD-10-CM | POA: Insufficient documentation

## 2020-06-18 DIAGNOSIS — Z95828 Presence of other vascular implants and grafts: Secondary | ICD-10-CM

## 2020-06-18 MED ORDER — HEPARIN SOD (PORK) LOCK FLUSH 100 UNIT/ML IV SOLN
500.0000 [IU] | Freq: Once | INTRAVENOUS | Status: AC
  Administered 2020-06-18: 500 [IU] via INTRAVENOUS

## 2020-06-18 MED ORDER — HEPARIN SOD (PORK) LOCK FLUSH 100 UNIT/ML IV SOLN
INTRAVENOUS | Status: AC
  Filled 2020-06-18: qty 5

## 2020-06-18 MED ORDER — IOHEXOL 300 MG/ML  SOLN
100.0000 mL | Freq: Once | INTRAMUSCULAR | Status: AC | PRN
  Administered 2020-06-18: 100 mL via INTRAVENOUS

## 2020-06-18 MED ORDER — SODIUM CHLORIDE 0.9% FLUSH
10.0000 mL | Freq: Once | INTRAVENOUS | Status: AC
  Administered 2020-06-18: 10 mL
  Filled 2020-06-18: qty 10

## 2020-06-18 NOTE — Patient Instructions (Signed)
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
## 2020-06-20 ENCOUNTER — Other Ambulatory Visit: Payer: Self-pay | Admitting: Physician Assistant

## 2020-06-20 DIAGNOSIS — C3492 Malignant neoplasm of unspecified part of left bronchus or lung: Secondary | ICD-10-CM

## 2020-06-21 ENCOUNTER — Inpatient Hospital Stay (HOSPITAL_BASED_OUTPATIENT_CLINIC_OR_DEPARTMENT_OTHER): Payer: Medicare Other | Admitting: Physician Assistant

## 2020-06-21 ENCOUNTER — Encounter: Payer: Self-pay | Admitting: Physician Assistant

## 2020-06-21 ENCOUNTER — Inpatient Hospital Stay: Payer: Medicare Other

## 2020-06-21 ENCOUNTER — Other Ambulatory Visit: Payer: Self-pay

## 2020-06-21 VITALS — BP 90/65 | HR 87 | Temp 97.6°F | Resp 12 | Ht 68.0 in | Wt 152.8 lb

## 2020-06-21 DIAGNOSIS — Z5112 Encounter for antineoplastic immunotherapy: Secondary | ICD-10-CM | POA: Diagnosis not present

## 2020-06-21 DIAGNOSIS — C3492 Malignant neoplasm of unspecified part of left bronchus or lung: Secondary | ICD-10-CM

## 2020-06-21 DIAGNOSIS — C3412 Malignant neoplasm of upper lobe, left bronchus or lung: Secondary | ICD-10-CM

## 2020-06-21 DIAGNOSIS — Z95828 Presence of other vascular implants and grafts: Secondary | ICD-10-CM

## 2020-06-21 LAB — CMP (CANCER CENTER ONLY)
ALT: <6 U/L (ref 0–44)
AST: 8 U/L — ABNORMAL LOW (ref 15–41)
Albumin: 3.3 g/dL — ABNORMAL LOW (ref 3.5–5.0)
Alkaline Phosphatase: 95 U/L (ref 38–126)
Anion gap: 6 (ref 5–15)
BUN: 8 mg/dL (ref 8–23)
CO2: 29 mmol/L (ref 22–32)
Calcium: 8.5 mg/dL — ABNORMAL LOW (ref 8.9–10.3)
Chloride: 103 mmol/L (ref 98–111)
Creatinine: 0.77 mg/dL (ref 0.61–1.24)
GFR, Estimated: >60 mL/min (ref >60)
Glucose, Bld: 173 mg/dL — ABNORMAL HIGH (ref 70–99)
Potassium: 3.7 mmol/L (ref 3.5–5.1)
Sodium: 138 mmol/L (ref 135–145)
Total Bilirubin: <0.2 mg/dL — ABNORMAL LOW (ref 0.3–1.2)
Total Protein: 6.1 g/dL — ABNORMAL LOW (ref 6.5–8.1)

## 2020-06-21 LAB — CBC WITH DIFFERENTIAL (CANCER CENTER ONLY)
Abs Immature Granulocytes: 0.03 10*3/uL (ref 0.00–0.07)
Basophils Absolute: 0.1 10*3/uL (ref 0.0–0.1)
Basophils Relative: 1 %
Eosinophils Absolute: 0.5 10*3/uL (ref 0.0–0.5)
Eosinophils Relative: 7 %
HCT: 33.2 % — ABNORMAL LOW (ref 39.0–52.0)
Hemoglobin: 10.6 g/dL — ABNORMAL LOW (ref 13.0–17.0)
Immature Granulocytes: 0 %
Lymphocytes Relative: 18 %
Lymphs Abs: 1.2 10*3/uL (ref 0.7–4.0)
MCH: 32.4 pg (ref 26.0–34.0)
MCHC: 31.9 g/dL (ref 30.0–36.0)
MCV: 101.5 fL — ABNORMAL HIGH (ref 80.0–100.0)
Monocytes Absolute: 0.6 10*3/uL (ref 0.1–1.0)
Monocytes Relative: 9 %
Neutro Abs: 4.4 10*3/uL (ref 1.7–7.7)
Neutrophils Relative %: 65 %
Platelet Count: 215 10*3/uL (ref 150–400)
RBC: 3.27 MIL/uL — ABNORMAL LOW (ref 4.22–5.81)
RDW: 14.1 % (ref 11.5–15.5)
WBC Count: 6.8 10*3/uL (ref 4.0–10.5)
nRBC: 0 % (ref 0.0–0.2)

## 2020-06-21 LAB — TSH: TSH: 1.102 u[IU]/mL (ref 0.320–4.118)

## 2020-06-21 MED ORDER — SODIUM CHLORIDE 0.9% FLUSH
10.0000 mL | Freq: Once | INTRAVENOUS | Status: AC
  Administered 2020-06-21: 10 mL
  Filled 2020-06-21: qty 10

## 2020-06-21 MED ORDER — SODIUM CHLORIDE 0.9 % IV SOLN
Freq: Once | INTRAVENOUS | Status: AC
  Filled 2020-06-21: qty 250

## 2020-06-21 MED ORDER — HEPARIN SOD (PORK) LOCK FLUSH 100 UNIT/ML IV SOLN
500.0000 [IU] | Freq: Once | INTRAVENOUS | Status: AC | PRN
  Administered 2020-06-21: 500 [IU]
  Filled 2020-06-21: qty 5

## 2020-06-21 MED ORDER — SODIUM CHLORIDE 0.9% FLUSH
10.0000 mL | INTRAVENOUS | Status: DC | PRN
  Administered 2020-06-21: 10 mL
  Filled 2020-06-21: qty 10

## 2020-06-21 MED ORDER — SODIUM CHLORIDE 0.9 % IV SOLN
200.0000 mg | Freq: Once | INTRAVENOUS | Status: AC
  Administered 2020-06-21: 200 mg via INTRAVENOUS
  Filled 2020-06-21: qty 8

## 2020-06-21 NOTE — Patient Instructions (Signed)
[facility] Discharge Instructions for Patients Receiving Chemotherapy  Today you received the following chemotherapy agents:  Keytruda.  To help prevent nausea and vomiting after your treatment, we encourage you to take your nausea medication as directed.   If you develop nausea and vomiting that is not controlled by your nausea medication, call the clinic.   BELOW ARE SYMPTOMS THAT SHOULD BE REPORTED IMMEDIATELY:  *FEVER GREATER THAN 100.5 F  *CHILLS WITH OR WITHOUT FEVER  NAUSEA AND VOMITING THAT IS NOT CONTROLLED WITH YOUR NAUSEA MEDICATION  *UNUSUAL SHORTNESS OF BREATH  *UNUSUAL BRUISING OR BLEEDING  TENDERNESS IN MOUTH AND THROAT WITH OR WITHOUT PRESENCE OF ULCERS  *URINARY PROBLEMS  *BOWEL PROBLEMS  UNUSUAL RASH Items with * indicate a potential emergency and should be followed up as soon as possible.  Feel free to call the clinic should you have any questions or concerns. The clinic phone number is [phone].  Please show the CHEMO ALERT CARD at check-in to the Emergency Department and triage nurse.    

## 2020-07-12 ENCOUNTER — Inpatient Hospital Stay (HOSPITAL_BASED_OUTPATIENT_CLINIC_OR_DEPARTMENT_OTHER): Payer: Medicare Other | Admitting: Internal Medicine

## 2020-07-12 ENCOUNTER — Encounter: Payer: Self-pay | Admitting: Internal Medicine

## 2020-07-12 ENCOUNTER — Inpatient Hospital Stay: Payer: Medicare Other

## 2020-07-12 ENCOUNTER — Inpatient Hospital Stay: Payer: Medicare Other | Attending: Internal Medicine

## 2020-07-12 ENCOUNTER — Other Ambulatory Visit: Payer: Self-pay

## 2020-07-12 VITALS — BP 114/83 | HR 73 | Temp 97.3°F | Resp 18 | Ht 68.0 in | Wt 149.1 lb

## 2020-07-12 DIAGNOSIS — C3412 Malignant neoplasm of upper lobe, left bronchus or lung: Secondary | ICD-10-CM | POA: Diagnosis present

## 2020-07-12 DIAGNOSIS — Z5112 Encounter for antineoplastic immunotherapy: Secondary | ICD-10-CM | POA: Diagnosis present

## 2020-07-12 DIAGNOSIS — Z79899 Other long term (current) drug therapy: Secondary | ICD-10-CM | POA: Diagnosis not present

## 2020-07-12 DIAGNOSIS — Z95828 Presence of other vascular implants and grafts: Secondary | ICD-10-CM

## 2020-07-12 DIAGNOSIS — C3492 Malignant neoplasm of unspecified part of left bronchus or lung: Secondary | ICD-10-CM

## 2020-07-12 LAB — CMP (CANCER CENTER ONLY)
ALT: <6 U/L (ref 0–44)
AST: 10 U/L — ABNORMAL LOW (ref 15–41)
Albumin: 3.4 g/dL — ABNORMAL LOW (ref 3.5–5.0)
Alkaline Phosphatase: 86 U/L (ref 38–126)
Anion gap: 8 (ref 5–15)
BUN: 5 mg/dL — ABNORMAL LOW (ref 8–23)
CO2: 27 mmol/L (ref 22–32)
Calcium: 8.9 mg/dL (ref 8.9–10.3)
Chloride: 107 mmol/L (ref 98–111)
Creatinine: 0.68 mg/dL (ref 0.61–1.24)
GFR, Estimated: >60 mL/min (ref >60)
Glucose, Bld: 107 mg/dL — ABNORMAL HIGH (ref 70–99)
Potassium: 3.8 mmol/L (ref 3.5–5.1)
Sodium: 142 mmol/L (ref 135–145)
Total Bilirubin: 0.4 mg/dL (ref 0.3–1.2)
Total Protein: 6.3 g/dL — ABNORMAL LOW (ref 6.5–8.1)

## 2020-07-12 LAB — CBC WITH DIFFERENTIAL (CANCER CENTER ONLY)
Abs Immature Granulocytes: 0.03 10*3/uL (ref 0.00–0.07)
Basophils Absolute: 0.1 10*3/uL (ref 0.0–0.1)
Basophils Relative: 2 %
Eosinophils Absolute: 0.6 10*3/uL — ABNORMAL HIGH (ref 0.0–0.5)
Eosinophils Relative: 10 %
HCT: 35.8 % — ABNORMAL LOW (ref 39.0–52.0)
Hemoglobin: 12.1 g/dL — ABNORMAL LOW (ref 13.0–17.0)
Immature Granulocytes: 1 %
Lymphocytes Relative: 19 %
Lymphs Abs: 1.2 10*3/uL (ref 0.7–4.0)
MCH: 32.6 pg (ref 26.0–34.0)
MCHC: 33.8 g/dL (ref 30.0–36.0)
MCV: 96.5 fL (ref 80.0–100.0)
Monocytes Absolute: 0.5 10*3/uL (ref 0.1–1.0)
Monocytes Relative: 9 %
Neutro Abs: 3.6 10*3/uL (ref 1.7–7.7)
Neutrophils Relative %: 59 %
Platelet Count: 213 10*3/uL (ref 150–400)
RBC: 3.71 MIL/uL — ABNORMAL LOW (ref 4.22–5.81)
RDW: 13.8 % (ref 11.5–15.5)
WBC Count: 6 10*3/uL (ref 4.0–10.5)
nRBC: 0 % (ref 0.0–0.2)

## 2020-07-12 LAB — TSH: TSH: 1.268 u[IU]/mL (ref 0.320–4.118)

## 2020-07-12 MED ORDER — SODIUM CHLORIDE 0.9% FLUSH
10.0000 mL | Freq: Once | INTRAVENOUS | Status: AC
  Administered 2020-07-12: 10 mL
  Filled 2020-07-12: qty 10

## 2020-07-12 MED ORDER — SODIUM CHLORIDE 0.9 % IV SOLN
Freq: Once | INTRAVENOUS | Status: AC
  Filled 2020-07-12: qty 250

## 2020-07-12 MED ORDER — SODIUM CHLORIDE 0.9% FLUSH
10.0000 mL | INTRAVENOUS | Status: DC | PRN
  Administered 2020-07-12: 10 mL
  Filled 2020-07-12: qty 10

## 2020-07-12 MED ORDER — HEPARIN SOD (PORK) LOCK FLUSH 100 UNIT/ML IV SOLN
500.0000 [IU] | Freq: Once | INTRAVENOUS | Status: AC | PRN
  Administered 2020-07-12: 500 [IU]
  Filled 2020-07-12: qty 5

## 2020-07-12 MED ORDER — SODIUM CHLORIDE 0.9 % IV SOLN
200.0000 mg | Freq: Once | INTRAVENOUS | Status: AC
  Administered 2020-07-12: 200 mg via INTRAVENOUS
  Filled 2020-07-12: qty 8

## 2020-07-12 NOTE — Patient Instructions (Signed)
[facility] Discharge Instructions for Patients Receiving Chemotherapy  Today you received the following chemotherapy agents: pembrolizumab.  To help prevent nausea and vomiting after your treatment, we encourage you to take your nausea medication as directed.   If you develop nausea and vomiting that is not controlled by your nausea medication, call the clinic.   BELOW ARE SYMPTOMS THAT SHOULD BE REPORTED IMMEDIATELY:  *FEVER GREATER THAN 100.5 F  *CHILLS WITH OR WITHOUT FEVER  NAUSEA AND VOMITING THAT IS NOT CONTROLLED WITH YOUR NAUSEA MEDICATION  *UNUSUAL SHORTNESS OF BREATH  *UNUSUAL BRUISING OR BLEEDING  TENDERNESS IN MOUTH AND THROAT WITH OR WITHOUT PRESENCE OF ULCERS  *URINARY PROBLEMS  *BOWEL PROBLEMS  UNUSUAL RASH Items with * indicate a potential emergency and should be followed up as soon as possible.  Feel free to call the clinic should you have any questions or concerns. The clinic phone number is [phone].  Please show the CHEMO ALERT CARD at check-in to the Emergency Department and triage nurse.   
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
## 2020-07-12 NOTE — Progress Notes (Signed)
Patient completed infusion without incident. In no visible distress at time of discharge. Assisted out of cancer center via wheelchair. AVS declined.
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
## 2020-08-02 ENCOUNTER — Inpatient Hospital Stay: Payer: Medicare Other

## 2020-08-02 ENCOUNTER — Encounter: Payer: Self-pay | Admitting: Physician Assistant

## 2020-08-02 ENCOUNTER — Other Ambulatory Visit: Payer: Self-pay

## 2020-08-02 ENCOUNTER — Inpatient Hospital Stay (HOSPITAL_BASED_OUTPATIENT_CLINIC_OR_DEPARTMENT_OTHER): Payer: Medicare Other | Admitting: Physician Assistant

## 2020-08-02 VITALS — BP 101/74 | HR 90 | Temp 98.1°F | Resp 20 | Ht 68.0 in | Wt 145.9 lb

## 2020-08-02 VITALS — BP 130/77 | HR 82 | Temp 98.5°F | Resp 18

## 2020-08-02 DIAGNOSIS — C3492 Malignant neoplasm of unspecified part of left bronchus or lung: Secondary | ICD-10-CM

## 2020-08-02 DIAGNOSIS — Z5112 Encounter for antineoplastic immunotherapy: Secondary | ICD-10-CM

## 2020-08-02 DIAGNOSIS — C3412 Malignant neoplasm of upper lobe, left bronchus or lung: Secondary | ICD-10-CM

## 2020-08-02 LAB — TSH: TSH: 2.34 u[IU]/mL (ref 0.320–4.118)

## 2020-08-02 LAB — CBC WITH DIFFERENTIAL (CANCER CENTER ONLY)
Abs Immature Granulocytes: 0.02 10*3/uL (ref 0.00–0.07)
Basophils Absolute: 0.1 10*3/uL (ref 0.0–0.1)
Basophils Relative: 1 %
Eosinophils Absolute: 0.3 10*3/uL (ref 0.0–0.5)
Eosinophils Relative: 5 %
HCT: 36.6 % — ABNORMAL LOW (ref 39.0–52.0)
Hemoglobin: 12.3 g/dL — ABNORMAL LOW (ref 13.0–17.0)
Immature Granulocytes: 0 %
Lymphocytes Relative: 16 %
Lymphs Abs: 1 10*3/uL (ref 0.7–4.0)
MCH: 32.2 pg (ref 26.0–34.0)
MCHC: 33.6 g/dL (ref 30.0–36.0)
MCV: 95.8 fL (ref 80.0–100.0)
Monocytes Absolute: 0.5 10*3/uL (ref 0.1–1.0)
Monocytes Relative: 9 %
Neutro Abs: 4.3 10*3/uL (ref 1.7–7.7)
Neutrophils Relative %: 69 %
Platelet Count: 217 10*3/uL (ref 150–400)
RBC: 3.82 MIL/uL — ABNORMAL LOW (ref 4.22–5.81)
RDW: 14.1 % (ref 11.5–15.5)
WBC Count: 6.3 10*3/uL (ref 4.0–10.5)
nRBC: 0 % (ref 0.0–0.2)

## 2020-08-02 LAB — CMP (CANCER CENTER ONLY)
ALT: 7 U/L (ref 0–44)
AST: 10 U/L — ABNORMAL LOW (ref 15–41)
Albumin: 3.5 g/dL (ref 3.5–5.0)
Alkaline Phosphatase: 83 U/L (ref 38–126)
Anion gap: 7 (ref 5–15)
BUN: 8 mg/dL (ref 8–23)
CO2: 27 mmol/L (ref 22–32)
Calcium: 8.9 mg/dL (ref 8.9–10.3)
Chloride: 104 mmol/L (ref 98–111)
Creatinine: 0.8 mg/dL (ref 0.61–1.24)
GFR, Estimated: >60 mL/min (ref >60)
Glucose, Bld: 255 mg/dL — ABNORMAL HIGH (ref 70–99)
Potassium: 3.7 mmol/L (ref 3.5–5.1)
Sodium: 138 mmol/L (ref 135–145)
Total Bilirubin: 0.3 mg/dL (ref 0.3–1.2)
Total Protein: 6.4 g/dL — ABNORMAL LOW (ref 6.5–8.1)

## 2020-08-02 MED ORDER — SODIUM CHLORIDE 0.9% FLUSH
10.0000 mL | INTRAVENOUS | Status: DC | PRN
  Administered 2020-08-02: 10 mL
  Filled 2020-08-02: qty 10

## 2020-08-02 MED ORDER — HEPARIN SOD (PORK) LOCK FLUSH 100 UNIT/ML IV SOLN
500.0000 [IU] | Freq: Once | INTRAVENOUS | Status: AC | PRN
  Administered 2020-08-02: 500 [IU]
  Filled 2020-08-02: qty 5

## 2020-08-02 MED ORDER — SODIUM CHLORIDE 0.9 % IV SOLN
200.0000 mg | Freq: Once | INTRAVENOUS | Status: AC
  Administered 2020-08-02: 200 mg via INTRAVENOUS
  Filled 2020-08-02: qty 8

## 2020-08-02 MED ORDER — SODIUM CHLORIDE 0.9 % IV SOLN
Freq: Once | INTRAVENOUS | Status: AC
  Filled 2020-08-02: qty 250

## 2020-08-02 NOTE — Patient Instructions (Signed)
[facility] Discharge Instructions for Patients Receiving Chemotherapy  Today you received the following chemotherapy agents: pembrolizumab.  To help prevent nausea and vomiting after your treatment, we encourage you to take your nausea medication as directed.   If you develop nausea and vomiting that is not controlled by your nausea medication, call the clinic.   BELOW ARE SYMPTOMS THAT SHOULD BE REPORTED IMMEDIATELY:  *FEVER GREATER THAN 100.5 F  *CHILLS WITH OR WITHOUT FEVER  NAUSEA AND VOMITING THAT IS NOT CONTROLLED WITH YOUR NAUSEA MEDICATION  *UNUSUAL SHORTNESS OF BREATH  *UNUSUAL BRUISING OR BLEEDING  TENDERNESS IN MOUTH AND THROAT WITH OR WITHOUT PRESENCE OF ULCERS  *URINARY PROBLEMS  *BOWEL PROBLEMS  UNUSUAL RASH Items with * indicate a potential emergency and should be followed up as soon as possible.  Feel free to call the clinic should you have any questions or concerns. The clinic phone number is [phone].  Please show the [place] at check-in to the Emergency Department and triage nurse.  Thank you for choosing [facility] to provide your oncology and hematology care.   Should you have questions after your visit to the [facility]), please contact this office at [phone] between 8:30 AM and 4:30 PM.  Voice mails left after 4:00 PM may not be returned until the following business day.  Calls received after 4:30 PM will be answered by an off-site Nurse Triage Line.    Prescription Refills:  Please have your pharmacy contact us directly for most prescription requests.  Contact the office directly for refills of narcotics (pain medications). Allow 48-72 hours for refills.  Appointments: Please contact the [facility] scheduling department [phone] for questions regarding [facility] appointment scheduling.  Contact the schedulers with any scheduling changes so that your appointment can be rescheduled in a  timely manner.   Central Scheduling for [facility] [phone] - Call to schedule procedures such as PET scans, CT scans, MRI, Ultrasound, etc.  To afford each patient quality time with our providers, please arrive 30 minutes before your scheduled appointment time.  If you arrive late for your appointment, you may be asked to reschedule.  We strive to give you quality time with our providers, and arriving late affects you and other patients whose appointments are after yours. If you are a no show for multiple scheduled visits, you may be dismissed from the clinic at the providers discretion.     Resources: [place] [employer] [phone] for additional information on assistance programs or assistance connecting with community support programs   [place]  [phone]: Information regarding food stamps, Medicaid, and utility assistance [employer] [place] [employer]'s shared-ride transportation service for eligible riders who have a disability that prevents them from riding the fixed route bus.   [facility] (801)[phone] Helps people with Medicare understand their rights and benefits, navigate the Medicare system, and secure the quality healthcare they deserve [employer] ([phone] [phone] Assists patients locate various types of support and financial assistance Cancer Care: 1-800-813-HOPE 773-849-6296) Provides financial assistance, online support groups, medication/co-pay assistance.   Transportation Assistance for appointments at [facility]: [facility] [phone]  Again, thank you for choosing [facility] for your care.

## 2020-08-07 ENCOUNTER — Telehealth: Payer: Self-pay | Admitting: Physician Assistant

## 2020-08-07 NOTE — Telephone Encounter (Signed)
Scheduled appts per 12/30 los. Pt to get updated appt calendar at next visit per appt notes.

## 2020-08-20 ENCOUNTER — Inpatient Hospital Stay: Payer: Medicare Other | Attending: Internal Medicine

## 2020-08-20 ENCOUNTER — Other Ambulatory Visit: Payer: Self-pay

## 2020-08-20 ENCOUNTER — Ambulatory Visit (HOSPITAL_COMMUNITY)
Admission: RE | Admit: 2020-08-20 | Discharge: 2020-08-20 | Disposition: A | Discharge status: Home / Self Care | Payer: Medicare Other | Source: Ambulatory Visit | Attending: Physician Assistant | Admitting: Physician Assistant

## 2020-08-20 ENCOUNTER — Encounter (HOSPITAL_COMMUNITY): Payer: Self-pay

## 2020-08-20 DIAGNOSIS — Z86718 Personal history of other venous thrombosis and embolism: Secondary | ICD-10-CM | POA: Insufficient documentation

## 2020-08-20 DIAGNOSIS — Z79899 Other long term (current) drug therapy: Secondary | ICD-10-CM | POA: Insufficient documentation

## 2020-08-20 DIAGNOSIS — C3492 Malignant neoplasm of unspecified part of left bronchus or lung: Secondary | ICD-10-CM | POA: Diagnosis present

## 2020-08-20 DIAGNOSIS — Z5112 Encounter for antineoplastic immunotherapy: Secondary | ICD-10-CM | POA: Diagnosis present

## 2020-08-20 DIAGNOSIS — C3412 Malignant neoplasm of upper lobe, left bronchus or lung: Secondary | ICD-10-CM | POA: Diagnosis present

## 2020-08-20 DIAGNOSIS — D649 Anemia, unspecified: Secondary | ICD-10-CM | POA: Diagnosis not present

## 2020-08-20 DIAGNOSIS — Z95828 Presence of other vascular implants and grafts: Secondary | ICD-10-CM

## 2020-08-20 DIAGNOSIS — C7989 Secondary malignant neoplasm of other specified sites: Secondary | ICD-10-CM | POA: Insufficient documentation

## 2020-08-20 MED ORDER — HEPARIN SOD (PORK) LOCK FLUSH 100 UNIT/ML IV SOLN
500.0000 [IU] | Freq: Once | INTRAVENOUS | Status: AC
  Administered 2020-08-20: 500 [IU] via INTRAVENOUS

## 2020-08-20 MED ORDER — IOHEXOL 300 MG/ML  SOLN
100.0000 mL | Freq: Once | INTRAMUSCULAR | Status: AC | PRN
  Administered 2020-08-20: 100 mL via INTRAVENOUS

## 2020-08-20 MED ORDER — SODIUM CHLORIDE 0.9% FLUSH
10.0000 mL | Freq: Once | INTRAVENOUS | Status: AC
  Administered 2020-08-20: 10 mL
  Filled 2020-08-20: qty 10

## 2020-08-20 MED ORDER — HEPARIN SOD (PORK) LOCK FLUSH 100 UNIT/ML IV SOLN
INTRAVENOUS | Status: AC
  Filled 2020-08-20: qty 5

## 2020-08-20 NOTE — Progress Notes (Signed)
Patient left accessed for CT

## 2020-08-23 ENCOUNTER — Other Ambulatory Visit: Payer: Self-pay

## 2020-08-23 ENCOUNTER — Inpatient Hospital Stay (HOSPITAL_BASED_OUTPATIENT_CLINIC_OR_DEPARTMENT_OTHER): Payer: Medicare Other | Admitting: Internal Medicine

## 2020-08-23 ENCOUNTER — Inpatient Hospital Stay: Payer: Medicare Other

## 2020-08-23 ENCOUNTER — Encounter: Payer: Self-pay | Admitting: Internal Medicine

## 2020-08-23 VITALS — BP 133/89 | HR 92 | Temp 97.2°F | Resp 18 | Ht 68.0 in | Wt 143.8 lb

## 2020-08-23 DIAGNOSIS — C3492 Malignant neoplasm of unspecified part of left bronchus or lung: Secondary | ICD-10-CM

## 2020-08-23 DIAGNOSIS — Z5112 Encounter for antineoplastic immunotherapy: Secondary | ICD-10-CM

## 2020-08-23 DIAGNOSIS — C3412 Malignant neoplasm of upper lobe, left bronchus or lung: Secondary | ICD-10-CM

## 2020-08-23 DIAGNOSIS — Z95828 Presence of other vascular implants and grafts: Secondary | ICD-10-CM

## 2020-08-23 LAB — CMP (CANCER CENTER ONLY)
ALT: <6 U/L (ref 0–44)
AST: 10 U/L — ABNORMAL LOW (ref 15–41)
Albumin: 3.8 g/dL (ref 3.5–5.0)
Alkaline Phosphatase: 93 U/L (ref 38–126)
Anion gap: 10 (ref 5–15)
BUN: 6 mg/dL — ABNORMAL LOW (ref 8–23)
CO2: 24 mmol/L (ref 22–32)
Calcium: 9.2 mg/dL (ref 8.9–10.3)
Chloride: 104 mmol/L (ref 98–111)
Creatinine: 0.72 mg/dL (ref 0.61–1.24)
GFR, Estimated: >60 mL/min (ref >60)
Glucose, Bld: 139 mg/dL — ABNORMAL HIGH (ref 70–99)
Potassium: 3.7 mmol/L (ref 3.5–5.1)
Sodium: 138 mmol/L (ref 135–145)
Total Bilirubin: 0.4 mg/dL (ref 0.3–1.2)
Total Protein: 6.8 g/dL (ref 6.5–8.1)

## 2020-08-23 LAB — CBC WITH DIFFERENTIAL (CANCER CENTER ONLY)
Abs Immature Granulocytes: 0.02 10*3/uL (ref 0.00–0.07)
Basophils Absolute: 0.1 10*3/uL (ref 0.0–0.1)
Basophils Relative: 1 %
Eosinophils Absolute: 0.3 10*3/uL (ref 0.0–0.5)
Eosinophils Relative: 4 %
HCT: 39.5 % (ref 39.0–52.0)
Hemoglobin: 13.3 g/dL (ref 13.0–17.0)
Immature Granulocytes: 0 %
Lymphocytes Relative: 20 %
Lymphs Abs: 1.3 10*3/uL (ref 0.7–4.0)
MCH: 31.6 pg (ref 26.0–34.0)
MCHC: 33.7 g/dL (ref 30.0–36.0)
MCV: 93.8 fL (ref 80.0–100.0)
Monocytes Absolute: 0.5 10*3/uL (ref 0.1–1.0)
Monocytes Relative: 8 %
Neutro Abs: 4.3 10*3/uL (ref 1.7–7.7)
Neutrophils Relative %: 67 %
Platelet Count: 220 10*3/uL (ref 150–400)
RBC: 4.21 MIL/uL — ABNORMAL LOW (ref 4.22–5.81)
RDW: 14 % (ref 11.5–15.5)
WBC Count: 6.4 10*3/uL (ref 4.0–10.5)
nRBC: 0 % (ref 0.0–0.2)

## 2020-08-23 LAB — TSH: TSH: 3.372 u[IU]/mL (ref 0.320–4.118)

## 2020-08-23 MED ORDER — HEPARIN SOD (PORK) LOCK FLUSH 100 UNIT/ML IV SOLN
500.0000 [IU] | Freq: Once | INTRAVENOUS | Status: AC | PRN
  Administered 2020-08-23: 500 [IU]
  Filled 2020-08-23: qty 5

## 2020-08-23 MED ORDER — SODIUM CHLORIDE 0.9% FLUSH
10.0000 mL | INTRAVENOUS | Status: DC | PRN
Start: 2020-08-23 — End: 2020-08-23
  Administered 2020-08-23: 10 mL
  Filled 2020-08-23: qty 10

## 2020-08-23 MED ORDER — SODIUM CHLORIDE 0.9 % IV SOLN
200.0000 mg | Freq: Once | INTRAVENOUS | Status: AC
  Administered 2020-08-23: 200 mg via INTRAVENOUS
  Filled 2020-08-23: qty 8

## 2020-08-23 MED ORDER — SODIUM CHLORIDE 0.9% FLUSH
10.0000 mL | Freq: Once | INTRAVENOUS | Status: AC
  Administered 2020-08-23: 10 mL
  Filled 2020-08-23: qty 10

## 2020-08-23 MED ORDER — SODIUM CHLORIDE 0.9 % IV SOLN
Freq: Once | INTRAVENOUS | Status: AC
  Filled 2020-08-23: qty 250

## 2020-08-23 NOTE — Patient Instructions (Signed)
[facility] Discharge Instructions for Patients Receiving Chemotherapy  Today you received the following chemotherapy agents: pembrolizumab.  To help prevent nausea and vomiting after your treatment, we encourage you to take your nausea medication as directed.   If you develop nausea and vomiting that is not controlled by your nausea medication, call the clinic.   BELOW ARE SYMPTOMS THAT SHOULD BE REPORTED IMMEDIATELY:  *FEVER GREATER THAN 100.5 F  *CHILLS WITH OR WITHOUT FEVER  NAUSEA AND VOMITING THAT IS NOT CONTROLLED WITH YOUR NAUSEA MEDICATION  *UNUSUAL SHORTNESS OF BREATH  *UNUSUAL BRUISING OR BLEEDING  TENDERNESS IN MOUTH AND THROAT WITH OR WITHOUT PRESENCE OF ULCERS  *URINARY PROBLEMS  *BOWEL PROBLEMS  UNUSUAL RASH Items with * indicate a potential emergency and should be followed up as soon as possible.  Feel free to call the clinic should you have any questions or concerns. The clinic phone number is [phone].  Please show the CHEMO ALERT CARD at check-in to the Emergency Department and triage nurse.   
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
## 2020-08-23 NOTE — Patient Instructions (Signed)
Steps to Quit Smoking Smoking tobacco is the leading cause of preventable death. It can affect almost every organ in the body. Smoking puts you and people around you at risk for many serious, long-lasting (chronic) diseases. Quitting smoking can be hard, but it is one of the best things that you can do for your health. It is never too late to quit. How do I get ready to quit? When you decide to quit smoking, make a plan to help you succeed. Before you quit:  Pick a date to quit. Set a date within the next 2 weeks to give you time to prepare.  Write down the reasons why you are quitting. Keep this list in places where you will see it often.  Tell your family, friends, and co-workers that you are quitting. Their support is important.  Talk with your doctor about the choices that may help you quit.  Find out if your health insurance will pay for these treatments.  Know the people, places, things, and activities that make you want to smoke (triggers). Avoid them. What first steps can I take to quit smoking?  Throw away all cigarettes at home, at work, and in your car.  Throw away the things that you use when you smoke, such as ashtrays and lighters.  Clean your car. Make sure to empty the ashtray.  Clean your home, including curtains and carpets. What can I do to help me quit smoking? Talk with your doctor about taking medicines and seeing a counselor at the same time. You are more likely to succeed when you do both.  If you are pregnant or breastfeeding, talk with your doctor about counseling or other ways to quit smoking. Do not take medicine to help you quit smoking unless your doctor tells you to do so. To quit smoking: Quit right away  Quit smoking totally, instead of slowly cutting back on how much you smoke over a period of time.  Go to counseling. You are more likely to quit if you go to counseling sessions regularly. Take medicine You may take medicines to help you quit. Some  medicines need a prescription, and some you can buy over-the-counter. Some medicines may contain a drug called nicotine to replace the nicotine in cigarettes. Medicines may:  Help you to stop having the desire to smoke (cravings).  Help to stop the problems that come when you stop smoking (withdrawal symptoms). Your doctor may ask you to use:  Nicotine patches, gum, or lozenges.  Nicotine inhalers or sprays.  Non-nicotine medicine that is taken by mouth. Find resources Find resources and other ways to help you quit smoking and remain smoke-free after you quit. These resources are most helpful when you use them often. They include:  Online chats with a counselor.  Phone quitlines.  Printed self-help materials.  Support groups or group counseling.  Text messaging programs.  Mobile phone apps. Use apps on your mobile phone or tablet that can help you stick to your quit plan. There are many free apps for mobile phones and tablets as well as websites. Examples include Quit Guide from the CDC and smokefree.gov   What things can I do to make it easier to quit?  Talk to your family and friends. Ask them to support and encourage you.  Call a phone quitline (1-800-QUIT-NOW), reach out to support groups, or work with a counselor.  Ask people who smoke to not smoke around you.  Avoid places that make you want to smoke,   such as: ? Bars. ? Parties. ? Smoke-break areas at work.  Spend time with people who do not smoke.  Lower the stress in your life. Stress can make you want to smoke. Try these things to help your stress: ? Getting regular exercise. ? Doing deep-breathing exercises. ? Doing yoga. ? Meditating. ? Doing a body scan. To do this, close your eyes, focus on one area of your body at a time from head to toe. Notice which parts of your body are tense. Try to relax the muscles in those areas.   How will I feel when I quit smoking? Day 1 to 3 weeks Within the first 24 hours,  you may start to have some problems that come from quitting tobacco. These problems are very bad 2-3 days after you quit, but they do not often last for more than 2-3 weeks. You may get these symptoms:  Mood swings.  Feeling restless, nervous, angry, or annoyed.  Trouble concentrating.  Dizziness.  Strong desire for high-sugar foods and nicotine.  Weight gain.  Trouble pooping (constipation).  Feeling like you may vomit (nausea).  Coughing or a sore throat.  Changes in how the medicines that you take for other issues work in your body.  Depression.  Trouble sleeping (insomnia). Week 3 and afterward After the first 2-3 weeks of quitting, you may start to notice more positive results, such as:  Better sense of smell and taste.  Less coughing and sore throat.  Slower heart rate.  Lower blood pressure.  Clearer skin.  Better breathing.  Fewer sick days. Quitting smoking can be hard. Do not give up if you fail the first time. Some people need to try a few times before they succeed. Do your best to stick to your quit plan, and talk with your doctor if you have any questions or concerns. Summary  Smoking tobacco is the leading cause of preventable death. Quitting smoking can be hard, but it is one of the best things that you can do for your health.  When you decide to quit smoking, make a plan to help you succeed.  Quit smoking right away, not slowly over a period of time.  When you start quitting, seek help from your doctor, family, or friends. This information is not intended to replace advice given to you by your health care provider. Make sure you discuss any questions you have with your health care provider. Document Revised: 04/15/2019 Document Reviewed: 10/09/2018 Elsevier Patient Education  2021 [employer]  

## 2020-08-28 ENCOUNTER — Telehealth: Payer: Self-pay | Admitting: Internal Medicine

## 2020-08-28 NOTE — Telephone Encounter (Signed)
Per 1/20 los, pt next appts already scheduled
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
## 2020-09-13 ENCOUNTER — Encounter: Payer: Self-pay | Admitting: Physician Assistant

## 2020-09-13 ENCOUNTER — Inpatient Hospital Stay: Payer: Medicare Other

## 2020-09-13 ENCOUNTER — Ambulatory Visit: Payer: Medicare Other | Admitting: Physician Assistant

## 2020-09-13 ENCOUNTER — Other Ambulatory Visit: Payer: Medicare Other

## 2020-09-13 ENCOUNTER — Other Ambulatory Visit: Payer: Self-pay

## 2020-09-13 ENCOUNTER — Inpatient Hospital Stay (HOSPITAL_BASED_OUTPATIENT_CLINIC_OR_DEPARTMENT_OTHER): Payer: Medicare Other | Admitting: Physician Assistant

## 2020-09-13 ENCOUNTER — Ambulatory Visit: Payer: Medicare Other

## 2020-09-13 ENCOUNTER — Inpatient Hospital Stay: Payer: Medicare Other | Attending: Internal Medicine

## 2020-09-13 VITALS — BP 87/62 | HR 74 | Temp 97.4°F | Resp 18 | Ht 68.0 in | Wt 148.2 lb

## 2020-09-13 DIAGNOSIS — Z79899 Other long term (current) drug therapy: Secondary | ICD-10-CM | POA: Diagnosis not present

## 2020-09-13 DIAGNOSIS — Z95828 Presence of other vascular implants and grafts: Secondary | ICD-10-CM

## 2020-09-13 DIAGNOSIS — C7989 Secondary malignant neoplasm of other specified sites: Secondary | ICD-10-CM | POA: Diagnosis not present

## 2020-09-13 DIAGNOSIS — C770 Secondary and unspecified malignant neoplasm of lymph nodes of head, face and neck: Secondary | ICD-10-CM | POA: Diagnosis not present

## 2020-09-13 DIAGNOSIS — I959 Hypotension, unspecified: Secondary | ICD-10-CM

## 2020-09-13 DIAGNOSIS — C781 Secondary malignant neoplasm of mediastinum: Secondary | ICD-10-CM | POA: Diagnosis not present

## 2020-09-13 DIAGNOSIS — C3492 Malignant neoplasm of unspecified part of left bronchus or lung: Secondary | ICD-10-CM

## 2020-09-13 DIAGNOSIS — Z5112 Encounter for antineoplastic immunotherapy: Secondary | ICD-10-CM | POA: Insufficient documentation

## 2020-09-13 DIAGNOSIS — C3412 Malignant neoplasm of upper lobe, left bronchus or lung: Secondary | ICD-10-CM | POA: Insufficient documentation

## 2020-09-13 LAB — CMP (CANCER CENTER ONLY)
ALT: 7 U/L (ref 0–44)
AST: 11 U/L — ABNORMAL LOW (ref 15–41)
Albumin: 3.6 g/dL (ref 3.5–5.0)
Alkaline Phosphatase: 94 U/L (ref 38–126)
Anion gap: 6 (ref 5–15)
BUN: 10 mg/dL (ref 8–23)
CO2: 27 mmol/L (ref 22–32)
Calcium: 8.6 mg/dL — ABNORMAL LOW (ref 8.9–10.3)
Chloride: 104 mmol/L (ref 98–111)
Creatinine: 0.76 mg/dL (ref 0.61–1.24)
GFR, Estimated: >60 mL/min (ref >60)
Glucose, Bld: 112 mg/dL — ABNORMAL HIGH (ref 70–99)
Potassium: 3.9 mmol/L (ref 3.5–5.1)
Sodium: 137 mmol/L (ref 135–145)
Total Bilirubin: 0.3 mg/dL (ref 0.3–1.2)
Total Protein: 6.3 g/dL — ABNORMAL LOW (ref 6.5–8.1)

## 2020-09-13 LAB — CBC WITH DIFFERENTIAL (CANCER CENTER ONLY)
Abs Immature Granulocytes: 0.02 10*3/uL (ref 0.00–0.07)
Basophils Absolute: 0.1 10*3/uL (ref 0.0–0.1)
Basophils Relative: 1 %
Eosinophils Absolute: 0.5 10*3/uL (ref 0.0–0.5)
Eosinophils Relative: 7 %
HCT: 36.1 % — ABNORMAL LOW (ref 39.0–52.0)
Hemoglobin: 12.2 g/dL — ABNORMAL LOW (ref 13.0–17.0)
Immature Granulocytes: 0 %
Lymphocytes Relative: 25 %
Lymphs Abs: 1.7 10*3/uL (ref 0.7–4.0)
MCH: 31.9 pg (ref 26.0–34.0)
MCHC: 33.8 g/dL (ref 30.0–36.0)
MCV: 94.3 fL (ref 80.0–100.0)
Monocytes Absolute: 0.5 10*3/uL (ref 0.1–1.0)
Monocytes Relative: 7 %
Neutro Abs: 4 10*3/uL (ref 1.7–7.7)
Neutrophils Relative %: 60 %
Platelet Count: 208 10*3/uL (ref 150–400)
RBC: 3.83 MIL/uL — ABNORMAL LOW (ref 4.22–5.81)
RDW: 14.5 % (ref 11.5–15.5)
WBC Count: 6.7 10*3/uL (ref 4.0–10.5)
nRBC: 0 % (ref 0.0–0.2)

## 2020-09-13 LAB — TSH: TSH: 2.632 u[IU]/mL (ref 0.320–4.118)

## 2020-09-13 MED ORDER — HEPARIN SOD (PORK) LOCK FLUSH 100 UNIT/ML IV SOLN
500.0000 [IU] | Freq: Once | INTRAVENOUS | Status: AC | PRN
  Administered 2020-09-13: 500 [IU]
  Filled 2020-09-13: qty 5

## 2020-09-13 MED ORDER — SODIUM CHLORIDE 0.9% FLUSH
10.0000 mL | INTRAVENOUS | Status: DC | PRN
  Administered 2020-09-13: 10 mL
  Filled 2020-09-13: qty 10

## 2020-09-13 MED ORDER — SODIUM CHLORIDE 0.9% FLUSH
10.0000 mL | Freq: Once | INTRAVENOUS | Status: AC
  Administered 2020-09-13: 10 mL
  Filled 2020-09-13: qty 10

## 2020-09-13 MED ORDER — SODIUM CHLORIDE 0.9 % IV SOLN
Freq: Once | INTRAVENOUS | Status: AC
  Filled 2020-09-13: qty 250

## 2020-09-13 MED ORDER — SONAFINE EX EMUL
1.0000 "application " | Freq: Two times a day (BID) | CUTANEOUS | Status: DC
  Administered 2020-09-13: 1 via TOPICAL
  Filled 2020-09-13: qty 1

## 2020-09-13 MED ORDER — LEVOCETIRIZINE DIHYDROCHLORIDE 5 MG PO TABS: 5.0000 mg | ORAL_TABLET | Freq: Every evening | ORAL | 0 refills | Status: DC

## 2020-09-13 MED ORDER — SODIUM CHLORIDE 0.9 % IV SOLN
200.0000 mg | Freq: Once | INTRAVENOUS | Status: AC
  Administered 2020-09-13: 200 mg via INTRAVENOUS
  Filled 2020-09-13: qty 8

## 2020-09-13 NOTE — Patient Instructions (Addendum)
[place] Discharge Instructions for Patients Receiving Chemotherapy  Today you received the following chemotherapy agents Pembrolizumab [facility]).  To help prevent nausea and vomiting after your treatment, we encourage you to take your nausea medication as prescribed.   If you develop nausea and vomiting that is not controlled by your nausea medication, call the clinic.   BELOW ARE SYMPTOMS THAT SHOULD BE REPORTED IMMEDIATELY:  *FEVER GREATER THAN 100.5 F  *CHILLS WITH OR WITHOUT FEVER  NAUSEA AND VOMITING THAT IS NOT CONTROLLED WITH YOUR NAUSEA MEDICATION  *UNUSUAL SHORTNESS OF BREATH  *UNUSUAL BRUISING OR BLEEDING  TENDERNESS IN MOUTH AND THROAT WITH OR WITHOUT PRESENCE OF ULCERS  *URINARY PROBLEMS  *BOWEL PROBLEMS  UNUSUAL RASH Items with * indicate a potential emergency and should be followed up as soon as possible.  Feel free to call the clinic should you have any questions or concerns. The clinic phone number is [phone].  Please show the [place] at check-in to the Emergency Department and triage nurse.   Rehydration, Adult Rehydration is the replacement of body fluids, salts, and minerals (electrolytes) that are lost during dehydration. Dehydration is when there is not enough water or other fluids in the body. This happens when you lose more fluids than you take in. Common causes of dehydration include:  Not drinking enough fluids. This can occur when you are ill or doing activities that require a lot of energy, especially in hot weather.  Conditions that cause loss of water or other fluids, such as diarrhea, vomiting, sweating, or urinating a lot.  Other illnesses, such as fever or infection.  Certain medicines, such as those that remove excess fluid from the body (diuretics). Symptoms of mild or moderate dehydration may include thirst, dry lips and mouth, and dizziness. Symptoms of severe dehydration may include increased heart  rate, confusion, fainting, and not urinating. For severe dehydration, you may need to get fluids through an IV at the hospital. For mild or moderate dehydration, you can usually rehydrate at home by drinking certain fluids as told by your health care provider. What are the risks? Generally, rehydration is safe. However, taking in too much fluid (overhydration) can be a problem. This is rare. Overhydration can cause an electrolyte imbalance, kidney failure, or a decrease in salt (sodium) levels in the body. Supplies needed You will need an oral rehydration solution (ORS) if your health care provider tells you to use one. This is a drink to treat dehydration. It can be found in pharmacies and retail stores. How to rehydrate Fluids Follow instructions from your health care provider for rehydration. The kind of fluid and the amount you should drink depend on your condition. In general, you should choose drinks that you prefer.  If told by your health care provider, drink an ORS. ? Make an ORS by following instructions on the package. ? Start by drinking small amounts, about  cup (120 mL) every 5-10 minutes. ? Slowly increase how much you drink until you have taken the amount recommended by your health care provider.  Drink enough clear fluids to keep your urine pale yellow. If you were told to drink an ORS, finish it first, then start slowly drinking other clear fluids. Drink fluids such as: ? Water. This includes sparkling water and flavored water. Drinking only water can lead to having too little sodium in your body (hyponatremia). Follow the advice of your health care provider. ? Water from ice chips you suck on. ? Fruit  juice with water you add to it (diluted). ? Sports drinks. ? Hot or cold herbal teas. ? Broth-based soups. ? Milk or milk products. Food Follow instructions from your health care provider about what to eat while you rehydrate. Your health care provider may recommend that you  slowly begin eating regular foods in small amounts.  Eat foods that contain a healthy balance of electrolytes, such as bananas, oranges, potatoes, tomatoes, and spinach.  Avoid foods that are greasy or contain a lot of sugar. In some cases, you may get nutrition through a feeding tube that is passed through your nose and into your stomach (nasogastric tube, or NG tube). This may be done if you have uncontrolled vomiting or diarrhea.   Beverages to avoid Certain beverages may make dehydration worse. While you rehydrate, avoid drinking alcohol.   How to tell if you are recovering from dehydration You may be recovering from dehydration if:  You are urinating more often than before you started rehydrating.  Your urine is pale yellow.  Your energy level improves.  You vomit less frequently.  You have diarrhea less frequently.  Your appetite improves or returns to normal.  You feel less dizzy or less light-headed.  Your skin tone and color start to look more normal. Follow these instructions at home:  Take over-the-counter and prescription medicines only as told by your health care provider.  Do not take sodium tablets. Doing this can lead to having too much sodium in your body (hypernatremia). Contact a health care provider if:  You continue to have symptoms of mild or moderate dehydration, such as: ? Thirst. ? Dry lips. ? Slightly dry mouth. ? Dizziness. ? Dark urine or less urine than normal. ? Muscle cramps.  You continue to vomit or have diarrhea. Get help right away if you:  Have symptoms of dehydration that get worse.  Have a fever.  Have a severe headache.  Have been vomiting and the following happens: ? Your vomiting gets worse or does not go away. ? Your vomit includes blood or green matter (bile). ? You cannot eat or drink without vomiting.  Have problems with urination or bowel movements, such as: ? Diarrhea that gets worse or does not go away. ? Blood  in your stool (feces). This may cause stool to look black and tarry. ? Not urinating, or urinating only a small amount of very dark urine, within 6-8 hours.  Have trouble breathing.  Have symptoms that get worse with treatment. These symptoms may represent a serious problem that is an emergency. Do not wait to see if the symptoms will go away. Get medical help right away. Call your local emergency services (911 in the [place] Do not drive yourself to the hospital. Summary  Rehydration is the replacement of body fluids and minerals (electrolytes) that are lost during dehydration.  Follow instructions from your health care provider for rehydration. The kind of fluid and amount you should drink depend on your condition.  Slowly increase how much you drink until you have taken the amount recommended by your health care provider.  Contact your health care provider if you continue to show signs of mild or moderate dehydration. This information is not intended to replace advice given to you by your health care provider. Make sure you discuss any questions you have with your health care provider. Document Revised: 09/21/2019 Document Reviewed: 08/01/2019 [employer] Patient Education  2021 [employer].
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
## 2020-09-27 ENCOUNTER — Other Ambulatory Visit: Payer: Self-pay | Admitting: Physician Assistant

## 2020-09-27 DIAGNOSIS — I8290 Acute embolism and thrombosis of unspecified vein: Secondary | ICD-10-CM

## 2020-10-01 ENCOUNTER — Other Ambulatory Visit: Payer: Self-pay

## 2020-10-01 DIAGNOSIS — I8290 Acute embolism and thrombosis of unspecified vein: Secondary | ICD-10-CM

## 2020-10-01 MED ORDER — APIXABAN 5 MG PO TABS: 5.0000 mg | ORAL_TABLET | Freq: Two times a day (BID) | ORAL | 2 refills | Status: DC
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
## 2020-10-04 ENCOUNTER — Other Ambulatory Visit: Payer: Medicare Other

## 2020-10-04 ENCOUNTER — Other Ambulatory Visit: Payer: Self-pay

## 2020-10-04 ENCOUNTER — Inpatient Hospital Stay (HOSPITAL_BASED_OUTPATIENT_CLINIC_OR_DEPARTMENT_OTHER): Payer: Medicare Other | Admitting: Physician Assistant

## 2020-10-04 ENCOUNTER — Ambulatory Visit: Payer: Medicare Other | Admitting: Physician Assistant

## 2020-10-04 ENCOUNTER — Inpatient Hospital Stay: Payer: Medicare Other

## 2020-10-04 ENCOUNTER — Inpatient Hospital Stay: Payer: Medicare Other | Attending: Internal Medicine

## 2020-10-04 ENCOUNTER — Ambulatory Visit: Payer: Medicare Other

## 2020-10-04 VITALS — BP 109/73

## 2020-10-04 VITALS — BP 88/73 | HR 85 | Temp 97.9°F | Resp 15 | Ht 68.0 in | Wt 150.3 lb

## 2020-10-04 DIAGNOSIS — Z5112 Encounter for antineoplastic immunotherapy: Secondary | ICD-10-CM | POA: Diagnosis not present

## 2020-10-04 DIAGNOSIS — I959 Hypotension, unspecified: Secondary | ICD-10-CM

## 2020-10-04 DIAGNOSIS — Z79899 Other long term (current) drug therapy: Secondary | ICD-10-CM | POA: Diagnosis not present

## 2020-10-04 DIAGNOSIS — R5383 Other fatigue: Secondary | ICD-10-CM

## 2020-10-04 DIAGNOSIS — C3492 Malignant neoplasm of unspecified part of left bronchus or lung: Secondary | ICD-10-CM

## 2020-10-04 DIAGNOSIS — C3412 Malignant neoplasm of upper lobe, left bronchus or lung: Secondary | ICD-10-CM | POA: Diagnosis present

## 2020-10-04 DIAGNOSIS — Z95828 Presence of other vascular implants and grafts: Secondary | ICD-10-CM

## 2020-10-04 LAB — CBC WITH DIFFERENTIAL (CANCER CENTER ONLY)
Abs Immature Granulocytes: 0.02 10*3/uL (ref 0.00–0.07)
Basophils Absolute: 0.1 10*3/uL (ref 0.0–0.1)
Basophils Relative: 1 %
Eosinophils Absolute: 0.3 10*3/uL (ref 0.0–0.5)
Eosinophils Relative: 5 %
HCT: 36.9 % — ABNORMAL LOW (ref 39.0–52.0)
Hemoglobin: 12.5 g/dL — ABNORMAL LOW (ref 13.0–17.0)
Immature Granulocytes: 0 %
Lymphocytes Relative: 22 %
Lymphs Abs: 1.3 10*3/uL (ref 0.7–4.0)
MCH: 32 pg (ref 26.0–34.0)
MCHC: 33.9 g/dL (ref 30.0–36.0)
MCV: 94.4 fL (ref 80.0–100.0)
Monocytes Absolute: 0.5 10*3/uL (ref 0.1–1.0)
Monocytes Relative: 9 %
Neutro Abs: 3.7 10*3/uL (ref 1.7–7.7)
Neutrophils Relative %: 63 %
Platelet Count: 172 10*3/uL (ref 150–400)
RBC: 3.91 MIL/uL — ABNORMAL LOW (ref 4.22–5.81)
RDW: 14.4 % (ref 11.5–15.5)
WBC Count: 5.9 10*3/uL (ref 4.0–10.5)
nRBC: 0 % (ref 0.0–0.2)

## 2020-10-04 LAB — CMP (CANCER CENTER ONLY)
ALT: <6 U/L (ref 0–44)
AST: 9 U/L — ABNORMAL LOW (ref 15–41)
Albumin: 3.4 g/dL — ABNORMAL LOW (ref 3.5–5.0)
Alkaline Phosphatase: 98 U/L (ref 38–126)
Anion gap: 9 (ref 5–15)
BUN: 8 mg/dL (ref 8–23)
CO2: 25 mmol/L (ref 22–32)
Calcium: 8.6 mg/dL — ABNORMAL LOW (ref 8.9–10.3)
Chloride: 102 mmol/L (ref 98–111)
Creatinine: 0.81 mg/dL (ref 0.61–1.24)
GFR, Estimated: >60 mL/min (ref >60)
Glucose, Bld: 202 mg/dL — ABNORMAL HIGH (ref 70–99)
Potassium: 4.1 mmol/L (ref 3.5–5.1)
Sodium: 136 mmol/L (ref 135–145)
Total Bilirubin: 0.4 mg/dL (ref 0.3–1.2)
Total Protein: 6.2 g/dL — ABNORMAL LOW (ref 6.5–8.1)

## 2020-10-04 LAB — TSH: TSH: 2.51 u[IU]/mL (ref 0.320–4.118)

## 2020-10-04 MED ORDER — SODIUM CHLORIDE 0.9 % IV SOLN
Freq: Once | INTRAVENOUS | Status: AC
  Filled 2020-10-04: qty 250

## 2020-10-04 MED ORDER — SODIUM CHLORIDE 0.9 % IV SOLN
Freq: Once | INTRAVENOUS | Status: DC
  Filled 2020-10-04: qty 250

## 2020-10-04 MED ORDER — HEPARIN SOD (PORK) LOCK FLUSH 100 UNIT/ML IV SOLN
500.0000 [IU] | Freq: Once | INTRAVENOUS | Status: AC | PRN
  Administered 2020-10-04: 500 [IU]
  Filled 2020-10-04: qty 5

## 2020-10-04 MED ORDER — SODIUM CHLORIDE 0.9 % IV SOLN
200.0000 mg | Freq: Once | INTRAVENOUS | Status: AC
  Administered 2020-10-04: 200 mg via INTRAVENOUS
  Filled 2020-10-04: qty 8

## 2020-10-04 MED ORDER — SODIUM CHLORIDE 0.9% FLUSH
10.0000 mL | INTRAVENOUS | Status: DC | PRN
Start: 2020-10-04 — End: 2020-10-04
  Administered 2020-10-04: 10 mL
  Filled 2020-10-04: qty 10

## 2020-10-04 MED ORDER — LEVOCETIRIZINE DIHYDROCHLORIDE 5 MG PO TABS: 5.0000 mg | ORAL_TABLET | Freq: Every evening | ORAL | 1 refills | Status: DC

## 2020-10-04 MED ORDER — SODIUM CHLORIDE 0.9% FLUSH
10.0000 mL | Freq: Once | INTRAVENOUS | Status: AC
  Administered 2020-10-04: 10 mL
  Filled 2020-10-04: qty 10

## 2020-10-04 NOTE — Patient Instructions (Signed)
[facility] Discharge Instructions for Patients Receiving Chemotherapy  Today you received the following chemotherapy agents: Beryle Flock  To help prevent nausea and vomiting after your treatment, we encourage you to take your nausea medication as directed.    If you develop nausea and vomiting that is not controlled by your nausea medication, call the clinic.   BELOW ARE SYMPTOMS THAT SHOULD BE REPORTED IMMEDIATELY:  *FEVER GREATER THAN 100.5 F  *CHILLS WITH OR WITHOUT FEVER  NAUSEA AND VOMITING THAT IS NOT CONTROLLED WITH YOUR NAUSEA MEDICATION  *UNUSUAL SHORTNESS OF BREATH  *UNUSUAL BRUISING OR BLEEDING  TENDERNESS IN MOUTH AND THROAT WITH OR WITHOUT PRESENCE OF ULCERS  *URINARY PROBLEMS  *BOWEL PROBLEMS  UNUSUAL RASH Items with * indicate a potential emergency and should be followed up as soon as possible.  Feel free to call the clinic should you have any questions or concerns. The clinic phone number is [phone].  Please show the [facility] at check-in to the Emergency Department and triage nurse.

## 2020-10-04 NOTE — Progress Notes (Signed)
Okay to tx with low bp per [employer], [place]. Fluids ordered

## 2020-10-04 NOTE — Patient Instructions (Signed)
Implanted Port Insertion, Care After This sheet gives you information about how to care for yourself after your procedure. Your health care provider may also give you more specific instructions. If you have problems or questions, contact your health care provider. What can I expect after the procedure? After the procedure, it is common to have:  Discomfort at the port insertion site.  Bruising on the skin over the port. This should improve over 3-4 days. Follow these instructions at home: Port care  After your port is placed, you will get a manufacturer's information card. The card has information about your port. Keep this card with you at all times.  Take care of the port as told by your health care provider. Ask your health care provider if you or a family member can get training for taking care of the port at home. A home health care nurse may also take care of the port.  Make sure to remember what type of port you have. Incision care  Follow instructions from your health care provider about how to take care of your port insertion site. Make sure you: ? Wash your hands with soap and water before and after you change your bandage (dressing). If soap and water are not available, use hand sanitizer. ? Change your dressing as told by your health care provider. ? Leave stitches (sutures), skin glue, or adhesive strips in place. These skin closures may need to stay in place for 2 weeks or longer. If adhesive strip edges start to loosen and curl up, you may trim the loose edges. Do not remove adhesive strips completely unless your health care provider tells you to do that.  Check your port insertion site every day for signs of infection. Check for: ? Redness, swelling, or pain. ? Fluid or blood. ? Warmth. ? Pus or a bad smell.      Activity  Return to your normal activities as told by your health care provider. Ask your health care provider what activities are safe for you.  Do not  lift anything that is heavier than 10 lb (4.5 kg), or the limit that you are told, until your health care provider says that it is safe. General instructions  Take over-the-counter and prescription medicines only as told by your health care provider.  Do not take baths, swim, or use a hot tub until your health care provider approves. Ask your health care provider if you may take showers. You may only be allowed to take sponge baths.  Do not drive for 24 hours if you were given a sedative during your procedure.  Wear a medical alert bracelet in case of an emergency. This will tell any health care providers that you have a port.  Keep all follow-up visits as told by your health care provider. This is important. Contact a health care provider if:  You cannot flush your port with saline as directed, or you cannot draw blood from the port.  You have a fever or chills.  You have redness, swelling, or pain around your port insertion site.  You have fluid or blood coming from your port insertion site.  Your port insertion site feels warm to the touch.  You have pus or a bad smell coming from the port insertion site. Get help right away if:  You have chest pain or shortness of breath.  You have bleeding from your port that you cannot control. Summary  Take care of the port as told by your   health care provider. Keep the manufacturer's information card with you at all times.  Change your dressing as told by your health care provider.  Contact a health care provider if you have a fever or chills or if you have redness, swelling, or pain around your port insertion site.  Keep all follow-up visits as told by your health care provider. This information is not intended to replace advice given to you by your health care provider. Make sure you discuss any questions you have with your health care provider. Document Revised: 02/16/2018 Document Reviewed: 02/16/2018 Elsevier Patient Education   2021 [employer]  

## 2020-10-23 ENCOUNTER — Ambulatory Visit (HOSPITAL_COMMUNITY)
Admission: RE | Admit: 2020-10-23 | Discharge: 2020-10-23 | Disposition: A | Discharge status: Home / Self Care | Payer: Medicare Other | Source: Ambulatory Visit | Attending: Physician Assistant | Admitting: Physician Assistant

## 2020-10-23 ENCOUNTER — Other Ambulatory Visit: Payer: Self-pay

## 2020-10-23 ENCOUNTER — Ambulatory Visit: Payer: Medicare Other

## 2020-10-23 DIAGNOSIS — C3492 Malignant neoplasm of unspecified part of left bronchus or lung: Secondary | ICD-10-CM | POA: Insufficient documentation

## 2020-10-23 DIAGNOSIS — Z5112 Encounter for antineoplastic immunotherapy: Secondary | ICD-10-CM | POA: Diagnosis not present

## 2020-10-23 MED ORDER — IOHEXOL 300 MG/ML  SOLN
100.0000 mL | Freq: Once | INTRAMUSCULAR | Status: AC | PRN
  Administered 2020-10-23: 100 mL via INTRAVENOUS

## 2020-10-23 MED ORDER — HEPARIN SOD (PORK) LOCK FLUSH 100 UNIT/ML IV SOLN
INTRAVENOUS | Status: AC
  Filled 2020-10-23: qty 5

## 2020-10-23 MED ORDER — SODIUM CHLORIDE 0.9% FLUSH
10.0000 mL | Freq: Once | INTRAVENOUS | Status: AC
  Administered 2020-10-23: 10 mL
  Filled 2020-10-23: qty 10

## 2020-10-23 MED ORDER — HEPARIN SOD (PORK) LOCK FLUSH 100 UNIT/ML IV SOLN
500.0000 [IU] | Freq: Once | INTRAVENOUS | Status: AC
  Administered 2020-10-23: 500 [IU] via INTRAVENOUS

## 2020-10-25 ENCOUNTER — Inpatient Hospital Stay (HOSPITAL_BASED_OUTPATIENT_CLINIC_OR_DEPARTMENT_OTHER): Payer: Medicare Other | Admitting: Internal Medicine

## 2020-10-25 ENCOUNTER — Inpatient Hospital Stay: Payer: Medicare Other

## 2020-10-25 ENCOUNTER — Other Ambulatory Visit: Payer: Self-pay

## 2020-10-25 ENCOUNTER — Encounter: Payer: Self-pay | Admitting: Internal Medicine

## 2020-10-25 VITALS — BP 92/68 | HR 76 | Temp 97.9°F | Resp 13 | Ht 68.0 in | Wt 146.0 lb

## 2020-10-25 DIAGNOSIS — C3492 Malignant neoplasm of unspecified part of left bronchus or lung: Secondary | ICD-10-CM

## 2020-10-25 DIAGNOSIS — C3412 Malignant neoplasm of upper lobe, left bronchus or lung: Secondary | ICD-10-CM

## 2020-10-25 DIAGNOSIS — Z5112 Encounter for antineoplastic immunotherapy: Secondary | ICD-10-CM | POA: Diagnosis not present

## 2020-10-25 DIAGNOSIS — Z95828 Presence of other vascular implants and grafts: Secondary | ICD-10-CM

## 2020-10-25 LAB — CMP (CANCER CENTER ONLY)
ALT: <6 U/L (ref 0–44)
AST: 9 U/L — ABNORMAL LOW (ref 15–41)
Albumin: 3.8 g/dL (ref 3.5–5.0)
Alkaline Phosphatase: 92 U/L (ref 38–126)
Anion gap: 10 (ref 5–15)
BUN: 5 mg/dL — ABNORMAL LOW (ref 8–23)
CO2: 28 mmol/L (ref 22–32)
Calcium: 8.9 mg/dL (ref 8.9–10.3)
Chloride: 102 mmol/L (ref 98–111)
Creatinine: 0.77 mg/dL (ref 0.61–1.24)
GFR, Estimated: >60 mL/min (ref >60)
Glucose, Bld: 119 mg/dL — ABNORMAL HIGH (ref 70–99)
Potassium: 3.8 mmol/L (ref 3.5–5.1)
Sodium: 140 mmol/L (ref 135–145)
Total Bilirubin: 0.3 mg/dL (ref 0.3–1.2)
Total Protein: 6.5 g/dL (ref 6.5–8.1)

## 2020-10-25 LAB — CBC WITH DIFFERENTIAL (CANCER CENTER ONLY)
Abs Immature Granulocytes: 0.02 10*3/uL (ref 0.00–0.07)
Basophils Absolute: 0.1 10*3/uL (ref 0.0–0.1)
Basophils Relative: 1 %
Eosinophils Absolute: 0.5 10*3/uL (ref 0.0–0.5)
Eosinophils Relative: 8 %
HCT: 36.5 % — ABNORMAL LOW (ref 39.0–52.0)
Hemoglobin: 12.2 g/dL — ABNORMAL LOW (ref 13.0–17.0)
Immature Granulocytes: 0 %
Lymphocytes Relative: 21 %
Lymphs Abs: 1.2 10*3/uL (ref 0.7–4.0)
MCH: 31.8 pg (ref 26.0–34.0)
MCHC: 33.4 g/dL (ref 30.0–36.0)
MCV: 95.1 fL (ref 80.0–100.0)
Monocytes Absolute: 0.5 10*3/uL (ref 0.1–1.0)
Monocytes Relative: 9 %
Neutro Abs: 3.6 10*3/uL (ref 1.7–7.7)
Neutrophils Relative %: 61 %
Platelet Count: 153 10*3/uL (ref 150–400)
RBC: 3.84 MIL/uL — ABNORMAL LOW (ref 4.22–5.81)
RDW: 14.2 % (ref 11.5–15.5)
WBC Count: 5.9 10*3/uL (ref 4.0–10.5)
nRBC: 0 % (ref 0.0–0.2)

## 2020-10-25 LAB — TSH: TSH: 4.302 u[IU]/mL — ABNORMAL HIGH (ref 0.320–4.118)

## 2020-10-25 MED ORDER — HEPARIN SOD (PORK) LOCK FLUSH 100 UNIT/ML IV SOLN
500.0000 [IU] | Freq: Once | INTRAVENOUS | Status: AC | PRN
  Administered 2020-10-25: 500 [IU]
  Filled 2020-10-25: qty 5

## 2020-10-25 MED ORDER — SODIUM CHLORIDE 0.9 % IV SOLN
Freq: Once | INTRAVENOUS | Status: AC
  Filled 2020-10-25: qty 250

## 2020-10-25 MED ORDER — SODIUM CHLORIDE 0.9% FLUSH
10.0000 mL | INTRAVENOUS | Status: DC | PRN
  Administered 2020-10-25: 10 mL
  Filled 2020-10-25: qty 10

## 2020-10-25 MED ORDER — SODIUM CHLORIDE 0.9% FLUSH
10.0000 mL | Freq: Once | INTRAVENOUS | Status: AC
  Administered 2020-10-25: 10 mL
  Filled 2020-10-25: qty 10

## 2020-10-25 MED ORDER — PEMBROLIZUMAB CHEMO INJECTION 100 MG/4ML
200.0000 mg | Freq: Once | INTRAVENOUS | Status: AC
  Administered 2020-10-25: 200 mg via INTRAVENOUS
  Filled 2020-10-25: qty 8
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
## 2020-10-25 NOTE — Patient Instructions (Signed)
Steps to Quit Smoking Smoking tobacco is the leading cause of preventable death. It can affect almost every organ in the body. Smoking puts you and people around you at risk for many serious, long-lasting (chronic) diseases. Quitting smoking can be hard, but it is one of the best things that you can do for your health. It is never too late to quit. How do I get ready to quit? When you decide to quit smoking, make a plan to help you succeed. Before you quit:  Pick a date to quit. Set a date within the next 2 weeks to give you time to prepare.  Write down the reasons why you are quitting. Keep this list in places where you will see it often.  Tell your family, friends, and co-workers that you are quitting. Their support is important.  Talk with your doctor about the choices that may help you quit.  Find out if your health insurance will pay for these treatments.  Know the people, places, things, and activities that make you want to smoke (triggers). Avoid them. What first steps can I take to quit smoking?  Throw away all cigarettes at home, at work, and in your car.  Throw away the things that you use when you smoke, such as ashtrays and lighters.  Clean your car. Make sure to empty the ashtray.  Clean your home, including curtains and carpets. What can I do to help me quit smoking? Talk with your doctor about taking medicines and seeing a counselor at the same time. You are more likely to succeed when you do both.  If you are pregnant or breastfeeding, talk with your doctor about counseling or other ways to quit smoking. Do not take medicine to help you quit smoking unless your doctor tells you to do so. To quit smoking: Quit right away  Quit smoking totally, instead of slowly cutting back on how much you smoke over a period of time.  Go to counseling. You are more likely to quit if you go to counseling sessions regularly. Take medicine You may take medicines to help you quit. Some  medicines need a prescription, and some you can buy over-the-counter. Some medicines may contain a drug called nicotine to replace the nicotine in cigarettes. Medicines may:  Help you to stop having the desire to smoke (cravings).  Help to stop the problems that come when you stop smoking (withdrawal symptoms). Your doctor may ask you to use:  Nicotine patches, gum, or lozenges.  Nicotine inhalers or sprays.  Non-nicotine medicine that is taken by mouth. Find resources Find resources and other ways to help you quit smoking and remain smoke-free after you quit. These resources are most helpful when you use them often. They include:  Online chats with a counselor.  Phone quitlines.  Printed self-help materials.  Support groups or group counseling.  Text messaging programs.  Mobile phone apps. Use apps on your mobile phone or tablet that can help you stick to your quit plan. There are many free apps for mobile phones and tablets as well as websites. Examples include Quit Guide from the CDC and smokefree.gov   What things can I do to make it easier to quit?  Talk to your family and friends. Ask them to support and encourage you.  Call a phone quitline (1-800-QUIT-NOW), reach out to support groups, or work with a counselor.  Ask people who smoke to not smoke around you.  Avoid places that make you want to smoke,   such as: ? Bars. ? Parties. ? Smoke-break areas at work.  Spend time with people who do not smoke.  Lower the stress in your life. Stress can make you want to smoke. Try these things to help your stress: ? Getting regular exercise. ? Doing deep-breathing exercises. ? Doing yoga. ? Meditating. ? Doing a body scan. To do this, close your eyes, focus on one area of your body at a time from head to toe. Notice which parts of your body are tense. Try to relax the muscles in those areas.   How will I feel when I quit smoking? Day 1 to 3 weeks Within the first 24 hours,  you may start to have some problems that come from quitting tobacco. These problems are very bad 2-3 days after you quit, but they do not often last for more than 2-3 weeks. You may get these symptoms:  Mood swings.  Feeling restless, nervous, angry, or annoyed.  Trouble concentrating.  Dizziness.  Strong desire for high-sugar foods and nicotine.  Weight gain.  Trouble pooping (constipation).  Feeling like you may vomit (nausea).  Coughing or a sore throat.  Changes in how the medicines that you take for other issues work in your body.  Depression.  Trouble sleeping (insomnia). Week 3 and afterward After the first 2-3 weeks of quitting, you may start to notice more positive results, such as:  Better sense of smell and taste.  Less coughing and sore throat.  Slower heart rate.  Lower blood pressure.  Clearer skin.  Better breathing.  Fewer sick days. Quitting smoking can be hard. Do not give up if you fail the first time. Some people need to try a few times before they succeed. Do your best to stick to your quit plan, and talk with your doctor if you have any questions or concerns. Summary  Smoking tobacco is the leading cause of preventable death. Quitting smoking can be hard, but it is one of the best things that you can do for your health.  When you decide to quit smoking, make a plan to help you succeed.  Quit smoking right away, not slowly over a period of time.  When you start quitting, seek help from your doctor, family, or friends. This information is not intended to replace advice given to you by your health care provider. Make sure you discuss any questions you have with your health care provider. Document Revised: 04/15/2019 Document Reviewed: 10/09/2018 Elsevier Patient Education  2021 [employer]  

## 2020-10-25 NOTE — Patient Instructions (Signed)
[facility] Discharge Instructions for Patients Receiving Chemotherapy  Today you received the following chemotherapy agents: Beryle Flock  To help prevent nausea and vomiting after your treatment, we encourage you to take your nausea medication as directed.    If you develop nausea and vomiting that is not controlled by your nausea medication, call the clinic.   BELOW ARE SYMPTOMS THAT SHOULD BE REPORTED IMMEDIATELY:  *FEVER GREATER THAN 100.5 F  *CHILLS WITH OR WITHOUT FEVER  NAUSEA AND VOMITING THAT IS NOT CONTROLLED WITH YOUR NAUSEA MEDICATION  *UNUSUAL SHORTNESS OF BREATH  *UNUSUAL BRUISING OR BLEEDING  TENDERNESS IN MOUTH AND THROAT WITH OR WITHOUT PRESENCE OF ULCERS  *URINARY PROBLEMS  *BOWEL PROBLEMS  UNUSUAL RASH Items with * indicate a potential emergency and should be followed up as soon as possible.  Feel free to call the clinic should you have any questions or concerns. The clinic phone number is [phone].  Please show the [facility] at check-in to the Emergency Department and triage nurse.

## 2020-10-25 NOTE — Patient Instructions (Signed)
Implanted Port Insertion, Care After This sheet gives you information about how to care for yourself after your procedure. Your health care provider may also give you more specific instructions. If you have problems or questions, contact your health care provider. What can I expect after the procedure? After the procedure, it is common to have:  Discomfort at the port insertion site.  Bruising on the skin over the port. This should improve over 3-4 days. Follow these instructions at home: Port care  After your port is placed, you will get a manufacturer's information card. The card has information about your port. Keep this card with you at all times.  Take care of the port as told by your health care provider. Ask your health care provider if you or a family member can get training for taking care of the port at home. A home health care nurse may also take care of the port.  Make sure to remember what type of port you have. Incision care  Follow instructions from your health care provider about how to take care of your port insertion site. Make sure you: ? Wash your hands with soap and water before and after you change your bandage (dressing). If soap and water are not available, use hand sanitizer. ? Change your dressing as told by your health care provider. ? Leave stitches (sutures), skin glue, or adhesive strips in place. These skin closures may need to stay in place for 2 weeks or longer. If adhesive strip edges start to loosen and curl up, you may trim the loose edges. Do not remove adhesive strips completely unless your health care provider tells you to do that.  Check your port insertion site every day for signs of infection. Check for: ? Redness, swelling, or pain. ? Fluid or blood. ? Warmth. ? Pus or a bad smell.      Activity  Return to your normal activities as told by your health care provider. Ask your health care provider what activities are safe for you.  Do not  lift anything that is heavier than 10 lb (4.5 kg), or the limit that you are told, until your health care provider says that it is safe. General instructions  Take over-the-counter and prescription medicines only as told by your health care provider.  Do not take baths, swim, or use a hot tub until your health care provider approves. Ask your health care provider if you may take showers. You may only be allowed to take sponge baths.  Do not drive for 24 hours if you were given a sedative during your procedure.  Wear a medical alert bracelet in case of an emergency. This will tell any health care providers that you have a port.  Keep all follow-up visits as told by your health care provider. This is important. Contact a health care provider if:  You cannot flush your port with saline as directed, or you cannot draw blood from the port.  You have a fever or chills.  You have redness, swelling, or pain around your port insertion site.  You have fluid or blood coming from your port insertion site.  Your port insertion site feels warm to the touch.  You have pus or a bad smell coming from the port insertion site. Get help right away if:  You have chest pain or shortness of breath.  You have bleeding from your port that you cannot control. Summary  Take care of the port as told by your   health care provider. Keep the manufacturer's information card with you at all times.  Change your dressing as told by your health care provider.  Contact a health care provider if you have a fever or chills or if you have redness, swelling, or pain around your port insertion site.  Keep all follow-up visits as told by your health care provider. This information is not intended to replace advice given to you by your health care provider. Make sure you discuss any questions you have with your health care provider. Document Revised: 02/16/2018 Document Reviewed: 02/16/2018 Elsevier Patient Education   2021 [employer]  
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
## 2020-11-15 ENCOUNTER — Inpatient Hospital Stay: Payer: Medicare Other

## 2020-11-15 ENCOUNTER — Inpatient Hospital Stay (HOSPITAL_BASED_OUTPATIENT_CLINIC_OR_DEPARTMENT_OTHER): Payer: Medicare Other | Admitting: Physician Assistant

## 2020-11-15 ENCOUNTER — Other Ambulatory Visit: Payer: Self-pay

## 2020-11-15 ENCOUNTER — Inpatient Hospital Stay: Payer: Medicare Other | Attending: Internal Medicine

## 2020-11-15 VITALS — BP 112/76 | HR 60 | Temp 96.9°F | Resp 18 | Ht 68.0 in | Wt 146.9 lb

## 2020-11-15 DIAGNOSIS — C490 Malignant neoplasm of connective and soft tissue of head, face and neck: Secondary | ICD-10-CM | POA: Insufficient documentation

## 2020-11-15 DIAGNOSIS — C3412 Malignant neoplasm of upper lobe, left bronchus or lung: Secondary | ICD-10-CM | POA: Diagnosis present

## 2020-11-15 DIAGNOSIS — C3492 Malignant neoplasm of unspecified part of left bronchus or lung: Secondary | ICD-10-CM | POA: Diagnosis not present

## 2020-11-15 DIAGNOSIS — Z5112 Encounter for antineoplastic immunotherapy: Secondary | ICD-10-CM | POA: Insufficient documentation

## 2020-11-15 DIAGNOSIS — Z79899 Other long term (current) drug therapy: Secondary | ICD-10-CM | POA: Insufficient documentation

## 2020-11-15 LAB — CBC WITH DIFFERENTIAL (CANCER CENTER ONLY)
Abs Immature Granulocytes: 0.01 10*3/uL (ref 0.00–0.07)
Basophils Absolute: 0.1 10*3/uL (ref 0.0–0.1)
Basophils Relative: 1 %
Eosinophils Absolute: 0.3 10*3/uL (ref 0.0–0.5)
Eosinophils Relative: 5 %
HCT: 36.5 % — ABNORMAL LOW (ref 39.0–52.0)
Hemoglobin: 12.4 g/dL — ABNORMAL LOW (ref 13.0–17.0)
Immature Granulocytes: 0 %
Lymphocytes Relative: 17 %
Lymphs Abs: 0.9 10*3/uL (ref 0.7–4.0)
MCH: 32.1 pg (ref 26.0–34.0)
MCHC: 34 g/dL (ref 30.0–36.0)
MCV: 94.6 fL (ref 80.0–100.0)
Monocytes Absolute: 0.5 10*3/uL (ref 0.1–1.0)
Monocytes Relative: 9 %
Neutro Abs: 3.5 10*3/uL (ref 1.7–7.7)
Neutrophils Relative %: 68 %
Platelet Count: 168 10*3/uL (ref 150–400)
RBC: 3.86 MIL/uL — ABNORMAL LOW (ref 4.22–5.81)
RDW: 14.6 % (ref 11.5–15.5)
WBC Count: 5.2 10*3/uL (ref 4.0–10.5)
nRBC: 0 % (ref 0.0–0.2)

## 2020-11-15 LAB — CMP (CANCER CENTER ONLY)
ALT: <6 U/L (ref 0–44)
AST: 12 U/L — ABNORMAL LOW (ref 15–41)
Albumin: 3.7 g/dL (ref 3.5–5.0)
Alkaline Phosphatase: 105 U/L (ref 38–126)
Anion gap: 8 (ref 5–15)
BUN: 5 mg/dL — ABNORMAL LOW (ref 8–23)
CO2: 27 mmol/L (ref 22–32)
Calcium: 8.8 mg/dL — ABNORMAL LOW (ref 8.9–10.3)
Chloride: 104 mmol/L (ref 98–111)
Creatinine: 0.7 mg/dL (ref 0.61–1.24)
GFR, Estimated: >60 mL/min (ref >60)
Glucose, Bld: 155 mg/dL — ABNORMAL HIGH (ref 70–99)
Potassium: 4.2 mmol/L (ref 3.5–5.1)
Sodium: 139 mmol/L (ref 135–145)
Total Bilirubin: 0.2 mg/dL — ABNORMAL LOW (ref 0.3–1.2)
Total Protein: 6.4 g/dL — ABNORMAL LOW (ref 6.5–8.1)

## 2020-11-15 MED ORDER — SODIUM CHLORIDE 0.9 % IV SOLN
200.0000 mg | Freq: Once | INTRAVENOUS | Status: AC
  Administered 2020-11-15: 200 mg via INTRAVENOUS
  Filled 2020-11-15: qty 8

## 2020-11-15 MED ORDER — HEPARIN SOD (PORK) LOCK FLUSH 100 UNIT/ML IV SOLN
500.0000 [IU] | Freq: Once | INTRAVENOUS | Status: AC | PRN
  Administered 2020-11-15: 500 [IU]
  Filled 2020-11-15: qty 5

## 2020-11-15 MED ORDER — SODIUM CHLORIDE 0.9 % IV SOLN
Freq: Once | INTRAVENOUS | Status: AC
  Filled 2020-11-15: qty 250

## 2020-11-15 MED ORDER — SODIUM CHLORIDE 0.9% FLUSH
10.0000 mL | INTRAVENOUS | Status: DC | PRN
  Administered 2020-11-15: 10 mL
  Filled 2020-11-15: qty 10

## 2020-11-15 NOTE — Patient Instructions (Signed)
[facility] Discharge Instructions for Patients Receiving Chemotherapy  Today you received the following chemotherapy agents: pembrolizumab.  To help prevent nausea and vomiting after your treatment, we encourage you to take your nausea medication as directed.   If you develop nausea and vomiting that is not controlled by your nausea medication, call the clinic.   BELOW ARE SYMPTOMS THAT SHOULD BE REPORTED IMMEDIATELY:  *FEVER GREATER THAN 100.5 F  *CHILLS WITH OR WITHOUT FEVER  NAUSEA AND VOMITING THAT IS NOT CONTROLLED WITH YOUR NAUSEA MEDICATION  *UNUSUAL SHORTNESS OF BREATH  *UNUSUAL BRUISING OR BLEEDING  TENDERNESS IN MOUTH AND THROAT WITH OR WITHOUT PRESENCE OF ULCERS  *URINARY PROBLEMS  *BOWEL PROBLEMS  UNUSUAL RASH Items with * indicate a potential emergency and should be followed up as soon as possible.  Feel free to call the clinic should you have any questions or concerns. The clinic phone number is [phone].  Please show the CHEMO ALERT CARD at check-in to the Emergency Department and triage nurse.   

## 2020-12-06 ENCOUNTER — Inpatient Hospital Stay: Payer: Medicare Other | Attending: Internal Medicine

## 2020-12-06 ENCOUNTER — Encounter: Payer: Self-pay | Admitting: Internal Medicine

## 2020-12-06 ENCOUNTER — Other Ambulatory Visit: Payer: Self-pay

## 2020-12-06 ENCOUNTER — Inpatient Hospital Stay (HOSPITAL_BASED_OUTPATIENT_CLINIC_OR_DEPARTMENT_OTHER): Payer: Medicare Other | Admitting: Internal Medicine

## 2020-12-06 ENCOUNTER — Other Ambulatory Visit: Payer: Medicare Other

## 2020-12-06 ENCOUNTER — Inpatient Hospital Stay: Payer: Medicare Other

## 2020-12-06 DIAGNOSIS — C3412 Malignant neoplasm of upper lobe, left bronchus or lung: Secondary | ICD-10-CM | POA: Insufficient documentation

## 2020-12-06 DIAGNOSIS — Z79899 Other long term (current) drug therapy: Secondary | ICD-10-CM | POA: Diagnosis not present

## 2020-12-06 DIAGNOSIS — C07 Malignant neoplasm of parotid gland: Secondary | ICD-10-CM | POA: Diagnosis not present

## 2020-12-06 DIAGNOSIS — C3492 Malignant neoplasm of unspecified part of left bronchus or lung: Secondary | ICD-10-CM

## 2020-12-06 DIAGNOSIS — C3490 Malignant neoplasm of unspecified part of unspecified bronchus or lung: Secondary | ICD-10-CM

## 2020-12-06 DIAGNOSIS — Z95828 Presence of other vascular implants and grafts: Secondary | ICD-10-CM

## 2020-12-06 DIAGNOSIS — Z5112 Encounter for antineoplastic immunotherapy: Secondary | ICD-10-CM | POA: Insufficient documentation

## 2020-12-06 LAB — CMP (CANCER CENTER ONLY)
ALT: <6 U/L (ref 0–44)
AST: 13 U/L — ABNORMAL LOW (ref 15–41)
Albumin: 3.5 g/dL (ref 3.5–5.0)
Alkaline Phosphatase: 109 U/L (ref 38–126)
Anion gap: 10 (ref 5–15)
BUN: 4 mg/dL — ABNORMAL LOW (ref 8–23)
CO2: 27 mmol/L (ref 22–32)
Calcium: 8.8 mg/dL — ABNORMAL LOW (ref 8.9–10.3)
Chloride: 102 mmol/L (ref 98–111)
Creatinine: 0.78 mg/dL (ref 0.61–1.24)
GFR, Estimated: >60 mL/min (ref >60)
Glucose, Bld: 200 mg/dL — ABNORMAL HIGH (ref 70–99)
Potassium: 4.1 mmol/L (ref 3.5–5.1)
Sodium: 139 mmol/L (ref 135–145)
Total Bilirubin: 0.3 mg/dL (ref 0.3–1.2)
Total Protein: 6.4 g/dL — ABNORMAL LOW (ref 6.5–8.1)

## 2020-12-06 LAB — CBC WITH DIFFERENTIAL (CANCER CENTER ONLY)
Abs Immature Granulocytes: 0.05 10*3/uL (ref 0.00–0.07)
Basophils Absolute: 0.1 10*3/uL (ref 0.0–0.1)
Basophils Relative: 1 %
Eosinophils Absolute: 0.4 10*3/uL (ref 0.0–0.5)
Eosinophils Relative: 6 %
HCT: 38 % — ABNORMAL LOW (ref 39.0–52.0)
Hemoglobin: 12.5 g/dL — ABNORMAL LOW (ref 13.0–17.0)
Immature Granulocytes: 1 %
Lymphocytes Relative: 17 %
Lymphs Abs: 1.1 10*3/uL (ref 0.7–4.0)
MCH: 31.8 pg (ref 26.0–34.0)
MCHC: 32.9 g/dL (ref 30.0–36.0)
MCV: 96.7 fL (ref 80.0–100.0)
Monocytes Absolute: 0.6 10*3/uL (ref 0.1–1.0)
Monocytes Relative: 9 %
Neutro Abs: 4.3 10*3/uL (ref 1.7–7.7)
Neutrophils Relative %: 66 %
Platelet Count: 171 10*3/uL (ref 150–400)
RBC: 3.93 MIL/uL — ABNORMAL LOW (ref 4.22–5.81)
RDW: 14.9 % (ref 11.5–15.5)
WBC Count: 6.4 10*3/uL (ref 4.0–10.5)
nRBC: 0 % (ref 0.0–0.2)

## 2020-12-06 LAB — TSH: TSH: 9.483 u[IU]/mL — ABNORMAL HIGH (ref 0.350–4.500)

## 2020-12-06 MED ORDER — SODIUM CHLORIDE 0.9 % IV SOLN
200.0000 mg | Freq: Once | INTRAVENOUS | Status: AC
  Administered 2020-12-06: 200 mg via INTRAVENOUS
  Filled 2020-12-06: qty 8

## 2020-12-06 MED ORDER — HEPARIN SOD (PORK) LOCK FLUSH 100 UNIT/ML IV SOLN
500.0000 [IU] | Freq: Once | INTRAVENOUS | Status: AC | PRN
  Administered 2020-12-06: 500 [IU]
  Filled 2020-12-06: qty 5

## 2020-12-06 MED ORDER — SODIUM CHLORIDE 0.9% FLUSH
10.0000 mL | Freq: Once | INTRAVENOUS | Status: AC
  Administered 2020-12-06: 10 mL
  Filled 2020-12-06: qty 10

## 2020-12-06 MED ORDER — SODIUM CHLORIDE 0.9% FLUSH
10.0000 mL | INTRAVENOUS | Status: DC | PRN
  Administered 2020-12-06: 10 mL
  Filled 2020-12-06: qty 10

## 2020-12-06 MED ORDER — SODIUM CHLORIDE 0.9 % IV SOLN
Freq: Once | INTRAVENOUS | Status: AC
Start: 2020-12-06 — End: 2020-12-06
  Filled 2020-12-06: qty 250

## 2020-12-06 NOTE — Patient Instructions (Signed)
[facility] MEDICAL ONCOLOGY  Discharge Instructions: °Thank you for choosing [facility] to provide your oncology and hematology care.  ° °If you have a lab appointment with the Cancer Center, please go directly to the Cancer Center and check in at the registration area. °  °Wear comfortable clothing and clothing appropriate for easy access to any Portacath or PICC line.  ° °We strive to give you quality time with your provider. You may need to reschedule your appointment if you arrive late (15 or more minutes).  Arriving late affects you and other patients whose appointments are after yours.  Also, if you miss three or more appointments without notifying the office, you may be dismissed from the clinic at the provider’s discretion.    °  °For prescription refill requests, have your pharmacy contact our office and allow 72 hours for refills to be completed.   ° °Today you received the following chemotherapy and/or immunotherapy agent: Pembrolizumab (Keytruda) °  °To help prevent nausea and vomiting after your treatment, we encourage you to take your nausea medication as directed. ° °BELOW ARE SYMPTOMS THAT SHOULD BE REPORTED IMMEDIATELY: °*FEVER GREATER THAN 100.4 F (38 °C) OR HIGHER °*CHILLS OR SWEATING °*NAUSEA AND VOMITING THAT IS NOT CONTROLLED WITH YOUR NAUSEA MEDICATION °*UNUSUAL SHORTNESS OF BREATH °*UNUSUAL BRUISING OR BLEEDING °*URINARY PROBLEMS (pain or burning when urinating, or frequent urination) °*BOWEL PROBLEMS (unusual diarrhea, constipation, pain near the anus) °TENDERNESS IN MOUTH AND THROAT WITH OR WITHOUT PRESENCE OF ULCERS (sore throat, sores in mouth, or a toothache) °UNUSUAL RASH, SWELLING OR PAIN  °UNUSUAL VAGINAL DISCHARGE OR ITCHING  ° °Items with * indicate a potential emergency and should be followed up as soon as possible or go to the Emergency Department if any problems should occur. ° °Please show the CHEMOTHERAPY ALERT CARD or IMMUNOTHERAPY ALERT CARD at  check-in to the Emergency Department and triage nurse. ° °Should you have questions after your visit or need to cancel or reschedule your appointment, please contact [facility]ICAL [facility]COL[facility]  Dept: [phone]  and follow the prompts.  Office hours are 8:00 a.m. to 4:30 p.m. Monday - Friday. Please note that voicemails left after 4:00 p.m. may not be returned until the following business day.  We are closed weekends and major holidays. You have access to a nurse at all times for urgent questions. Please call the main number to the clinic Dept: [phone] and follow the prompts. ° ° °For any non-urgent questions, you may also contact your provider using MyChart. We now offer e-Visits for anyone 18 and older to request care online for non-urgent symptoms. For details visit [email or web address]. °  °Also download the MyChart app! Go to the app store, search "MyChart", open the app, select , and log in with your MyChart username and password. ° °Due to Covid, a mask is required upon entering the hospital/clinic. If you do not have a mask, one will be given to you upon arrival. For doctor visits, patients may have 1 support person aged 18 or older with them. For treatment visits, patients cannot have anyone with them due to current Covid guidelines and our immunocompromised population.  ° °
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
## 2020-12-25 ENCOUNTER — Other Ambulatory Visit: Payer: Self-pay

## 2020-12-25 ENCOUNTER — Ambulatory Visit (HOSPITAL_COMMUNITY)
Admission: RE | Admit: 2020-12-25 | Discharge: 2020-12-25 | Disposition: A | Discharge status: Home / Self Care | Payer: Medicare Other | Source: Ambulatory Visit | Attending: Internal Medicine | Admitting: Internal Medicine

## 2020-12-25 ENCOUNTER — Inpatient Hospital Stay: Payer: Medicare Other

## 2020-12-25 ENCOUNTER — Other Ambulatory Visit: Payer: Medicare Other

## 2020-12-25 DIAGNOSIS — Z95828 Presence of other vascular implants and grafts: Secondary | ICD-10-CM

## 2020-12-25 DIAGNOSIS — C3490 Malignant neoplasm of unspecified part of unspecified bronchus or lung: Secondary | ICD-10-CM | POA: Insufficient documentation

## 2020-12-25 DIAGNOSIS — C3492 Malignant neoplasm of unspecified part of left bronchus or lung: Secondary | ICD-10-CM

## 2020-12-25 DIAGNOSIS — Z5112 Encounter for antineoplastic immunotherapy: Secondary | ICD-10-CM | POA: Diagnosis not present

## 2020-12-25 LAB — CMP (CANCER CENTER ONLY)
ALT: <6 U/L (ref 0–44)
AST: 14 U/L — ABNORMAL LOW (ref 15–41)
Albumin: 3.9 g/dL (ref 3.5–5.0)
Alkaline Phosphatase: 103 U/L (ref 38–126)
Anion gap: 11 (ref 5–15)
BUN: 7 mg/dL — ABNORMAL LOW (ref 8–23)
CO2: 25 mmol/L (ref 22–32)
Calcium: 9.5 mg/dL (ref 8.9–10.3)
Chloride: 101 mmol/L (ref 98–111)
Creatinine: 0.77 mg/dL (ref 0.61–1.24)
GFR, Estimated: >60 mL/min (ref >60)
Glucose, Bld: 97 mg/dL (ref 70–99)
Potassium: 4.1 mmol/L (ref 3.5–5.1)
Sodium: 137 mmol/L (ref 135–145)
Total Bilirubin: 0.7 mg/dL (ref 0.3–1.2)
Total Protein: 7.2 g/dL (ref 6.5–8.1)

## 2020-12-25 LAB — CBC WITH DIFFERENTIAL (CANCER CENTER ONLY)
Abs Immature Granulocytes: 0.02 10*3/uL (ref 0.00–0.07)
Basophils Absolute: 0.1 10*3/uL (ref 0.0–0.1)
Basophils Relative: 1 %
Eosinophils Absolute: 0.2 10*3/uL (ref 0.0–0.5)
Eosinophils Relative: 3 %
HCT: 40.4 % (ref 39.0–52.0)
Hemoglobin: 13.6 g/dL (ref 13.0–17.0)
Immature Granulocytes: 0 %
Lymphocytes Relative: 19 %
Lymphs Abs: 1.3 10*3/uL (ref 0.7–4.0)
MCH: 31.9 pg (ref 26.0–34.0)
MCHC: 33.7 g/dL (ref 30.0–36.0)
MCV: 94.6 fL (ref 80.0–100.0)
Monocytes Absolute: 0.6 10*3/uL (ref 0.1–1.0)
Monocytes Relative: 9 %
Neutro Abs: 4.8 10*3/uL (ref 1.7–7.7)
Neutrophils Relative %: 68 %
Platelet Count: 216 10*3/uL (ref 150–400)
RBC: 4.27 MIL/uL (ref 4.22–5.81)
RDW: 14.8 % (ref 11.5–15.5)
WBC Count: 7 10*3/uL (ref 4.0–10.5)
nRBC: 0 % (ref 0.0–0.2)

## 2020-12-25 MED ORDER — HEPARIN SOD (PORK) LOCK FLUSH 100 UNIT/ML IV SOLN
INTRAVENOUS | Status: AC
  Filled 2020-12-25: qty 5

## 2020-12-25 MED ORDER — IOHEXOL 300 MG/ML  SOLN
100.0000 mL | Freq: Once | INTRAMUSCULAR | Status: AC | PRN
  Administered 2020-12-25: 100 mL via INTRAVENOUS

## 2020-12-25 MED ORDER — SODIUM CHLORIDE (PF) 0.9 % IJ SOLN
INTRAMUSCULAR | Status: AC
  Filled 2020-12-25: qty 50

## 2020-12-25 MED ORDER — HEPARIN SOD (PORK) LOCK FLUSH 100 UNIT/ML IV SOLN
500.0000 [IU] | Freq: Once | INTRAVENOUS | Status: AC
  Administered 2020-12-25: 500 [IU] via INTRAVENOUS

## 2020-12-25 MED ORDER — SODIUM CHLORIDE 0.9% FLUSH
10.0000 mL | Freq: Once | INTRAVENOUS | Status: AC
  Administered 2020-12-25: 10 mL
  Filled 2020-12-25: qty 10

## 2020-12-25 NOTE — Patient Instructions (Signed)

## 2020-12-26 ENCOUNTER — Other Ambulatory Visit: Payer: Self-pay | Admitting: Physician Assistant

## 2020-12-26 DIAGNOSIS — E039 Hypothyroidism, unspecified: Secondary | ICD-10-CM

## 2020-12-26 LAB — TSH: TSH: 9.501 u[IU]/mL — ABNORMAL HIGH (ref 0.320–4.118)

## 2020-12-26 MED ORDER — LEVOTHYROXINE SODIUM 50 MCG PO TABS
50.0000 ug | ORAL_TABLET | Freq: Every day | ORAL | 2 refills | Status: DC
Start: 2020-12-26 — End: 2020-12-26

## 2020-12-26 MED ORDER — LEVOTHYROXINE SODIUM 50 MCG PO TABS: 50.0000 ug | ORAL_TABLET | Freq: Every day | ORAL | 2 refills | Status: DC

## 2020-12-26 NOTE — Progress Notes (Signed)
I called the patient and spoke to his wife. I let her know I was going to start the patient on synthroid and that I was sending a prescription to the pharmacy.

## 2020-12-27 ENCOUNTER — Other Ambulatory Visit: Payer: Medicare Other

## 2020-12-27 ENCOUNTER — Encounter: Payer: Self-pay | Admitting: Internal Medicine

## 2020-12-27 ENCOUNTER — Inpatient Hospital Stay: Payer: Medicare Other

## 2020-12-27 ENCOUNTER — Other Ambulatory Visit: Payer: Self-pay

## 2020-12-27 ENCOUNTER — Other Ambulatory Visit: Payer: Self-pay | Admitting: Internal Medicine

## 2020-12-27 ENCOUNTER — Inpatient Hospital Stay (HOSPITAL_BASED_OUTPATIENT_CLINIC_OR_DEPARTMENT_OTHER): Payer: Medicare Other | Admitting: Internal Medicine

## 2020-12-27 VITALS — BP 103/68 | HR 76 | Temp 96.9°F | Resp 18 | Ht 68.0 in | Wt 150.0 lb

## 2020-12-27 DIAGNOSIS — C3412 Malignant neoplasm of upper lobe, left bronchus or lung: Secondary | ICD-10-CM

## 2020-12-27 DIAGNOSIS — C3492 Malignant neoplasm of unspecified part of left bronchus or lung: Secondary | ICD-10-CM

## 2020-12-27 DIAGNOSIS — Z5112 Encounter for antineoplastic immunotherapy: Secondary | ICD-10-CM

## 2020-12-27 DIAGNOSIS — Z95828 Presence of other vascular implants and grafts: Secondary | ICD-10-CM

## 2020-12-27 LAB — CMP (CANCER CENTER ONLY)
ALT: <6 U/L (ref 0–44)
AST: 9 U/L — ABNORMAL LOW (ref 15–41)
Albumin: 3.5 g/dL (ref 3.5–5.0)
Alkaline Phosphatase: 84 U/L (ref 38–126)
Anion gap: 8 (ref 5–15)
BUN: 7 mg/dL — ABNORMAL LOW (ref 8–23)
CO2: 28 mmol/L (ref 22–32)
Calcium: 9 mg/dL (ref 8.9–10.3)
Chloride: 106 mmol/L (ref 98–111)
Creatinine: 0.7 mg/dL (ref 0.61–1.24)
GFR, Estimated: >60 mL/min (ref >60)
Glucose, Bld: 114 mg/dL — ABNORMAL HIGH (ref 70–99)
Potassium: 4.1 mmol/L (ref 3.5–5.1)
Sodium: 142 mmol/L (ref 135–145)
Total Bilirubin: 0.3 mg/dL (ref 0.3–1.2)
Total Protein: 6.2 g/dL — ABNORMAL LOW (ref 6.5–8.1)

## 2020-12-27 LAB — CBC WITH DIFFERENTIAL (CANCER CENTER ONLY)
Abs Immature Granulocytes: 0.02 10*3/uL (ref 0.00–0.07)
Basophils Absolute: 0.1 10*3/uL (ref 0.0–0.1)
Basophils Relative: 1 %
Eosinophils Absolute: 0.4 10*3/uL (ref 0.0–0.5)
Eosinophils Relative: 5 %
HCT: 35 % — ABNORMAL LOW (ref 39.0–52.0)
Hemoglobin: 12 g/dL — ABNORMAL LOW (ref 13.0–17.0)
Immature Granulocytes: 0 %
Lymphocytes Relative: 15 %
Lymphs Abs: 1.1 10*3/uL (ref 0.7–4.0)
MCH: 32.4 pg (ref 26.0–34.0)
MCHC: 34.3 g/dL (ref 30.0–36.0)
MCV: 94.6 fL (ref 80.0–100.0)
Monocytes Absolute: 0.6 10*3/uL (ref 0.1–1.0)
Monocytes Relative: 7 %
Neutro Abs: 5.5 10*3/uL (ref 1.7–7.7)
Neutrophils Relative %: 72 %
Platelet Count: 181 10*3/uL (ref 150–400)
RBC: 3.7 MIL/uL — ABNORMAL LOW (ref 4.22–5.81)
RDW: 15 % (ref 11.5–15.5)
WBC Count: 7.6 10*3/uL (ref 4.0–10.5)
nRBC: 0 % (ref 0.0–0.2)

## 2020-12-27 LAB — TSH: TSH: 14.76 u[IU]/mL — ABNORMAL HIGH (ref 0.320–4.118)

## 2020-12-27 MED ORDER — SODIUM CHLORIDE 0.9% FLUSH
10.0000 mL | Freq: Once | INTRAVENOUS | Status: AC
  Administered 2020-12-27: 10 mL
  Filled 2020-12-27: qty 10

## 2020-12-27 MED ORDER — SODIUM CHLORIDE 0.9% FLUSH
10.0000 mL | INTRAVENOUS | Status: DC | PRN
  Administered 2020-12-27: 10 mL
  Filled 2020-12-27: qty 10

## 2020-12-27 MED ORDER — SODIUM CHLORIDE 0.9 % IV SOLN
200.0000 mg | Freq: Once | INTRAVENOUS | Status: AC
  Administered 2020-12-27: 200 mg via INTRAVENOUS
  Filled 2020-12-27: qty 8

## 2020-12-27 MED ORDER — SODIUM CHLORIDE 0.9 % IV SOLN
Freq: Once | INTRAVENOUS | Status: AC
  Filled 2020-12-27: qty 250

## 2020-12-27 MED ORDER — HEPARIN SOD (PORK) LOCK FLUSH 100 UNIT/ML IV SOLN
500.0000 [IU] | Freq: Once | INTRAVENOUS | Status: AC | PRN
  Administered 2020-12-27: 500 [IU]
  Filled 2020-12-27: qty 5

## 2020-12-27 NOTE — Patient Instructions (Signed)
[facility]  Discharge Instructions: °Thank you for choosing [facility] to provide your oncology and hematology care.  ° °If you have a lab appointment with the Cancer Center, please go directly to the Cancer Center and check in at the registration area. °  °Wear comfortable clothing and clothing appropriate for easy access to any Portacath or PICC line.  ° °We strive to give you quality time with your provider. You may need to reschedule your appointment if you arrive late (15 or more minutes).  Arriving late affects you and other patients whose appointments are after yours.  Also, if you miss three or more appointments without notifying the office, you may be dismissed from the clinic at the provider’s discretion.    °  °For prescription refill requests, have your pharmacy contact our office and allow 72 hours for refills to be completed.   ° °Today you received the following chemotherapy and/or immunotherapy agent: Pembrolizumab (Keytruda) °  °To help prevent nausea and vomiting after your treatment, we encourage you to take your nausea medication as directed. ° °BELOW ARE SYMPTOMS THAT SHOULD BE REPORTED IMMEDIATELY: °*FEVER GREATER THAN 100.4 F (38 °C) OR HIGHER °*CHILLS OR SWEATING °*NAUSEA AND VOMITING THAT IS NOT CONTROLLED WITH YOUR NAUSEA MEDICATION °*UNUSUAL SHORTNESS OF BREATH °*UNUSUAL BRUISING OR BLEEDING °*URINARY PROBLEMS (pain or burning when urinating, or frequent urination) °*BOWEL PROBLEMS (unusual diarrhea, constipation, pain near the anus) °TENDERNESS IN MOUTH AND THROAT WITH OR WITHOUT PRESENCE OF ULCERS (sore throat, sores in mouth, or a toothache) °UNUSUAL RASH, SWELLING OR PAIN  °UNUSUAL VAGINAL DISCHARGE OR ITCHING  ° °Items with * indicate a potential emergency and should be followed up as soon as possible or go to the Emergency Department if any problems should occur. ° °Please show the CHEMOTHERAPY ALERT CARD or IMMUNOTHERAPY ALERT CARD at  check-in to the Emergency Department and triage nurse. ° °Should you have questions after your visit or need to cancel or reschedule your appointment, please contact [facility]  Dept: [phone]  and follow the prompts.  Office hours are 8:00 a.m. to 4:30 p.m. Monday - Friday. Please note that voicemails left after 4:00 p.m. may not be returned until the following business day.  We are closed weekends and major holidays. You have access to a nurse at all times for urgent questions. Please call the main number to the clinic Dept: [phone] and follow the prompts. ° ° °For any non-urgent questions, you may also contact your provider using MyChart. We now offer e-Visits for anyone 18 and older to request care online for non-urgent symptoms. For details visit [email or web address]. °  °Also download the MyChart app! Go to the app store, search "MyChart", open the app, select , and log in with your MyChart username and password. ° °Due to Covid, a mask is required upon entering the hospital/clinic. If you do not have a mask, one will be given to you upon arrival. For doctor visits, patients may have 1 support person aged 18 or older with them. For treatment visits, patients cannot have anyone with them due to current Covid guidelines and our immunocompromised population.  ° °
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
## 2021-01-17 ENCOUNTER — Inpatient Hospital Stay: Payer: Medicare Other | Attending: Internal Medicine

## 2021-01-17 ENCOUNTER — Inpatient Hospital Stay: Payer: Medicare Other

## 2021-01-17 ENCOUNTER — Encounter: Payer: Self-pay | Admitting: Internal Medicine

## 2021-01-17 ENCOUNTER — Inpatient Hospital Stay (HOSPITAL_BASED_OUTPATIENT_CLINIC_OR_DEPARTMENT_OTHER): Payer: Medicare Other | Admitting: Internal Medicine

## 2021-01-17 ENCOUNTER — Other Ambulatory Visit: Payer: Self-pay

## 2021-01-17 VITALS — BP 121/82 | HR 79 | Temp 97.2°F | Resp 19 | Ht 68.0 in | Wt 154.0 lb

## 2021-01-17 DIAGNOSIS — C3492 Malignant neoplasm of unspecified part of left bronchus or lung: Secondary | ICD-10-CM

## 2021-01-17 DIAGNOSIS — Z95828 Presence of other vascular implants and grafts: Secondary | ICD-10-CM

## 2021-01-17 DIAGNOSIS — Z79899 Other long term (current) drug therapy: Secondary | ICD-10-CM | POA: Diagnosis not present

## 2021-01-17 DIAGNOSIS — C07 Malignant neoplasm of parotid gland: Secondary | ICD-10-CM | POA: Insufficient documentation

## 2021-01-17 DIAGNOSIS — C3412 Malignant neoplasm of upper lobe, left bronchus or lung: Secondary | ICD-10-CM

## 2021-01-17 DIAGNOSIS — Z5112 Encounter for antineoplastic immunotherapy: Secondary | ICD-10-CM | POA: Insufficient documentation

## 2021-01-17 LAB — CBC WITH DIFFERENTIAL (CANCER CENTER ONLY)
Abs Immature Granulocytes: 0.03 10*3/uL (ref 0.00–0.07)
Basophils Absolute: 0.1 10*3/uL (ref 0.0–0.1)
Basophils Relative: 1 %
Eosinophils Absolute: 0.4 10*3/uL (ref 0.0–0.5)
Eosinophils Relative: 7 %
HCT: 36.8 % — ABNORMAL LOW (ref 39.0–52.0)
Hemoglobin: 12.5 g/dL — ABNORMAL LOW (ref 13.0–17.0)
Immature Granulocytes: 1 %
Lymphocytes Relative: 25 %
Lymphs Abs: 1.5 10*3/uL (ref 0.7–4.0)
MCH: 33 pg (ref 26.0–34.0)
MCHC: 34 g/dL (ref 30.0–36.0)
MCV: 97.1 fL (ref 80.0–100.0)
Monocytes Absolute: 0.5 10*3/uL (ref 0.1–1.0)
Monocytes Relative: 8 %
Neutro Abs: 3.5 10*3/uL (ref 1.7–7.7)
Neutrophils Relative %: 58 %
Platelet Count: 205 10*3/uL (ref 150–400)
RBC: 3.79 MIL/uL — ABNORMAL LOW (ref 4.22–5.81)
RDW: 14.7 % (ref 11.5–15.5)
WBC Count: 6 10*3/uL (ref 4.0–10.5)
nRBC: 0 % (ref 0.0–0.2)

## 2021-01-17 LAB — CMP (CANCER CENTER ONLY)
ALT: <6 U/L (ref 0–44)
AST: 11 U/L — ABNORMAL LOW (ref 15–41)
Albumin: 3.5 g/dL (ref 3.5–5.0)
Alkaline Phosphatase: 97 U/L (ref 38–126)
Anion gap: 9 (ref 5–15)
BUN: 5 mg/dL — ABNORMAL LOW (ref 8–23)
CO2: 26 mmol/L (ref 22–32)
Calcium: 8.8 mg/dL — ABNORMAL LOW (ref 8.9–10.3)
Chloride: 102 mmol/L (ref 98–111)
Creatinine: 0.77 mg/dL (ref 0.61–1.24)
GFR, Estimated: >60 mL/min (ref >60)
Glucose, Bld: 190 mg/dL — ABNORMAL HIGH (ref 70–99)
Potassium: 4 mmol/L (ref 3.5–5.1)
Sodium: 137 mmol/L (ref 135–145)
Total Bilirubin: 0.4 mg/dL (ref 0.3–1.2)
Total Protein: 6.5 g/dL (ref 6.5–8.1)

## 2021-01-17 LAB — TSH: TSH: 4.242 u[IU]/mL — ABNORMAL HIGH (ref 0.320–4.118)

## 2021-01-17 MED ORDER — SODIUM CHLORIDE 0.9 % IV SOLN
200.0000 mg | Freq: Once | INTRAVENOUS | Status: AC
  Administered 2021-01-17: 200 mg via INTRAVENOUS
  Filled 2021-01-17: qty 8

## 2021-01-17 MED ORDER — SODIUM CHLORIDE 0.9 % IV SOLN
Freq: Once | INTRAVENOUS | Status: AC
  Filled 2021-01-17: qty 250

## 2021-01-17 MED ORDER — SODIUM CHLORIDE 0.9% FLUSH
10.0000 mL | Freq: Once | INTRAVENOUS | Status: AC
  Administered 2021-01-17: 10 mL
  Filled 2021-01-17: qty 10

## 2021-01-17 MED ORDER — HEPARIN SOD (PORK) LOCK FLUSH 100 UNIT/ML IV SOLN
500.0000 [IU] | Freq: Once | INTRAVENOUS | Status: DC | PRN
  Filled 2021-01-17: qty 5

## 2021-01-17 MED ORDER — SODIUM CHLORIDE 0.9% FLUSH
10.0000 mL | INTRAVENOUS | Status: DC | PRN
  Filled 2021-01-17: qty 10

## 2021-01-17 NOTE — Patient Instructions (Addendum)
[facility]  Discharge Instructions: °Thank you for choosing [facility] to provide your oncology and hematology care.  ° °If you have a lab appointment with the Cancer Center, please go directly to the Cancer Center and check in at the registration area. °  °Wear comfortable clothing and clothing appropriate for easy access to any Portacath or PICC line.  ° °We strive to give you quality time with your provider. You may need to reschedule your appointment if you arrive late (15 or more minutes).  Arriving late affects you and other patients whose appointments are after yours.  Also, if you miss three or more appointments without notifying the office, you may be dismissed from the clinic at the provider’s discretion.    °  °For prescription refill requests, have your pharmacy contact our office and allow 72 hours for refills to be completed.   ° °Today you received the following chemotherapy and/or immunotherapy agent: Pembrolizumab (Keytruda) °  °To help prevent nausea and vomiting after your treatment, we encourage you to take your nausea medication as directed. ° °BELOW ARE SYMPTOMS THAT SHOULD BE REPORTED IMMEDIATELY: °*FEVER GREATER THAN 100.4 F (38 °C) OR HIGHER °*CHILLS OR SWEATING °*NAUSEA AND VOMITING THAT IS NOT CONTROLLED WITH YOUR NAUSEA MEDICATION °*UNUSUAL SHORTNESS OF BREATH °*UNUSUAL BRUISING OR BLEEDING °*URINARY PROBLEMS (pain or burning when urinating, or frequent urination) °*BOWEL PROBLEMS (unusual diarrhea, constipation, pain near the anus) °TENDERNESS IN MOUTH AND THROAT WITH OR WITHOUT PRESENCE OF ULCERS (sore throat, sores in mouth, or a toothache) °UNUSUAL RASH, SWELLING OR PAIN  °UNUSUAL VAGINAL DISCHARGE OR ITCHING  ° °Items with * indicate a potential emergency and should be followed up as soon as possible or go to the Emergency Department if any problems should occur. ° °Please show the CHEMOTHERAPY ALERT CARD or IMMUNOTHERAPY ALERT CARD at  check-in to the Emergency Department and triage nurse. ° °Should you have questions after your visit or need to cancel or reschedule your appointment, please contact [facility]  Dept: [phone]  and follow the prompts.  Office hours are 8:00 a.m. to 4:30 p.m. Monday - Friday. Please note that voicemails left after 4:00 p.m. may not be returned until the following business day.  We are closed weekends and major holidays. You have access to a nurse at all times for urgent questions. Please call the main number to the clinic Dept: [phone] and follow the prompts. ° ° °For any non-urgent questions, you may also contact your provider using MyChart. We now offer e-Visits for anyone 18 and older to request care online for non-urgent symptoms. For details visit [email or web address]. °  °Also download the MyChart app! Go to the app store, search "MyChart", open the app, select [facility], and log in with your MyChart username and password. ° °Due to Covid, a mask is required upon entering the hospital/clinic. If you do not have a mask, one will be given to you upon arrival. For doctor visits, patients may have 1 support person aged 18 or older with them. For treatment visits, patients cannot have anyone with them due to current Covid guidelines and our immunocompromised population.  ° °
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
## 2021-02-07 ENCOUNTER — Inpatient Hospital Stay: Payer: Medicare Other

## 2021-02-07 ENCOUNTER — Encounter: Payer: Self-pay | Admitting: Physician Assistant

## 2021-02-07 ENCOUNTER — Inpatient Hospital Stay (HOSPITAL_BASED_OUTPATIENT_CLINIC_OR_DEPARTMENT_OTHER): Payer: Medicare Other | Admitting: Physician Assistant

## 2021-02-07 ENCOUNTER — Inpatient Hospital Stay: Payer: Medicare Other | Attending: Physician Assistant

## 2021-02-07 ENCOUNTER — Other Ambulatory Visit: Payer: Self-pay

## 2021-02-07 VITALS — BP 104/71 | HR 73 | Temp 97.1°F | Resp 18 | Ht 68.0 in | Wt 150.4 lb

## 2021-02-07 DIAGNOSIS — I8290 Acute embolism and thrombosis of unspecified vein: Secondary | ICD-10-CM | POA: Diagnosis not present

## 2021-02-07 DIAGNOSIS — C07 Malignant neoplasm of parotid gland: Secondary | ICD-10-CM | POA: Diagnosis not present

## 2021-02-07 DIAGNOSIS — C3412 Malignant neoplasm of upper lobe, left bronchus or lung: Secondary | ICD-10-CM

## 2021-02-07 DIAGNOSIS — Z5112 Encounter for antineoplastic immunotherapy: Secondary | ICD-10-CM | POA: Insufficient documentation

## 2021-02-07 DIAGNOSIS — C3492 Malignant neoplasm of unspecified part of left bronchus or lung: Secondary | ICD-10-CM

## 2021-02-07 DIAGNOSIS — Z79899 Other long term (current) drug therapy: Secondary | ICD-10-CM | POA: Insufficient documentation

## 2021-02-07 DIAGNOSIS — E039 Hypothyroidism, unspecified: Secondary | ICD-10-CM

## 2021-02-07 DIAGNOSIS — Z95828 Presence of other vascular implants and grafts: Secondary | ICD-10-CM

## 2021-02-07 LAB — CBC WITH DIFFERENTIAL (CANCER CENTER ONLY)
Abs Immature Granulocytes: 0.02 10*3/uL (ref 0.00–0.07)
Basophils Absolute: 0.1 10*3/uL (ref 0.0–0.1)
Basophils Relative: 1 %
Eosinophils Absolute: 0.5 10*3/uL (ref 0.0–0.5)
Eosinophils Relative: 7 %
HCT: 35.9 % — ABNORMAL LOW (ref 39.0–52.0)
Hemoglobin: 12.2 g/dL — ABNORMAL LOW (ref 13.0–17.0)
Immature Granulocytes: 0 %
Lymphocytes Relative: 18 %
Lymphs Abs: 1.2 10*3/uL (ref 0.7–4.0)
MCH: 32.8 pg (ref 26.0–34.0)
MCHC: 34 g/dL (ref 30.0–36.0)
MCV: 96.5 fL (ref 80.0–100.0)
Monocytes Absolute: 0.5 10*3/uL (ref 0.1–1.0)
Monocytes Relative: 8 %
Neutro Abs: 4.4 10*3/uL (ref 1.7–7.7)
Neutrophils Relative %: 66 %
Platelet Count: 221 10*3/uL (ref 150–400)
RBC: 3.72 MIL/uL — ABNORMAL LOW (ref 4.22–5.81)
RDW: 14.5 % (ref 11.5–15.5)
WBC Count: 6.7 10*3/uL (ref 4.0–10.5)
nRBC: 0 % (ref 0.0–0.2)

## 2021-02-07 LAB — CMP (CANCER CENTER ONLY)
ALT: <6 U/L (ref 0–44)
AST: 13 U/L — ABNORMAL LOW (ref 15–41)
Albumin: 3.3 g/dL — ABNORMAL LOW (ref 3.5–5.0)
Alkaline Phosphatase: 99 U/L (ref 38–126)
Anion gap: 6 (ref 5–15)
BUN: 4 mg/dL — ABNORMAL LOW (ref 8–23)
CO2: 30 mmol/L (ref 22–32)
Calcium: 8.9 mg/dL (ref 8.9–10.3)
Chloride: 106 mmol/L (ref 98–111)
Creatinine: 0.72 mg/dL (ref 0.61–1.24)
GFR, Estimated: >60 mL/min (ref >60)
Glucose, Bld: 107 mg/dL — ABNORMAL HIGH (ref 70–99)
Potassium: 4.2 mmol/L (ref 3.5–5.1)
Sodium: 142 mmol/L (ref 135–145)
Total Bilirubin: 0.2 mg/dL — ABNORMAL LOW (ref 0.3–1.2)
Total Protein: 6.3 g/dL — ABNORMAL LOW (ref 6.5–8.1)

## 2021-02-07 LAB — TSH: TSH: 4.994 u[IU]/mL — ABNORMAL HIGH (ref 0.320–4.118)

## 2021-02-07 MED ORDER — HEPARIN SOD (PORK) LOCK FLUSH 100 UNIT/ML IV SOLN
500.0000 [IU] | Freq: Once | INTRAVENOUS | Status: AC | PRN
  Administered 2021-02-07: 500 [IU]
  Filled 2021-02-07: qty 5

## 2021-02-07 MED ORDER — LEVOTHYROXINE SODIUM 50 MCG PO TABS: 75.0000 ug | ORAL_TABLET | Freq: Every day | ORAL | 2 refills | Status: DC

## 2021-02-07 MED ORDER — SODIUM CHLORIDE 0.9% FLUSH
10.0000 mL | INTRAVENOUS | Status: DC | PRN
  Administered 2021-02-07: 10 mL
  Filled 2021-02-07: qty 10

## 2021-02-07 MED ORDER — SODIUM CHLORIDE 0.9 % IV SOLN
200.0000 mg | Freq: Once | INTRAVENOUS | Status: AC
  Administered 2021-02-07: 200 mg via INTRAVENOUS
  Filled 2021-02-07: qty 8

## 2021-02-07 MED ORDER — APIXABAN 5 MG PO TABS: 5.0000 mg | ORAL_TABLET | Freq: Two times a day (BID) | ORAL | 2 refills | Status: DC

## 2021-02-07 MED ORDER — SODIUM CHLORIDE 0.9 % IV SOLN
Freq: Once | INTRAVENOUS | Status: AC
  Filled 2021-02-07: qty 250

## 2021-02-07 MED ORDER — LEVOCETIRIZINE DIHYDROCHLORIDE 5 MG PO TABS: 5.0000 mg | ORAL_TABLET | Freq: Every evening | ORAL | 1 refills | Status: DC

## 2021-02-07 MED ORDER — SODIUM CHLORIDE 0.9% FLUSH
10.0000 mL | Freq: Once | INTRAVENOUS | Status: AC
Start: 2021-02-07 — End: 2021-02-07
  Administered 2021-02-07: 10 mL
  Filled 2021-02-07: qty 10

## 2021-02-27 ENCOUNTER — Telehealth: Payer: Self-pay | Admitting: Family Medicine

## 2021-02-27 NOTE — Telephone Encounter (Signed)
   [name] DOB: 1956/03/10 MRN: [id number]   RIDER WAIVER AND RELEASE OF LIABILITY  For purposes of improving physical access to our facilities, [facility] is pleased to partner with third parties to provide [facility] patients or other authorized individuals the option of convenient, on-demand ground transportation services (the [place]") through use of the technology service that enables users to request on-demand ground transportation from independent third-party providers.  By opting to use and accept these [employer], I, the undersigned, hereby agree on behalf of myself, and on behalf of any minor child using the Government social research officer for whom I am the parent or legal guardian, as follows:  Government social research officer provided to me are provided by independent third-party transportation providers who are not [employer] or employees and who are unaffiliated with [employer]. [employer] is neither a transportation carrier nor a common or public carrier. [facility] has no control over the quality or safety of the transportation that occurs as a result of the [employer]. [facility] cannot guarantee that any third-party transportation provider will complete any arranged transportation service. [facility] makes no representation, warranty, or guarantee regarding the reliability, timeliness, quality, safety, suitability, or availability of any of the Transport Services or that they will be error free. I fully understand that traveling by vehicle involves risks and dangers of serious bodily injury, including permanent disability, paralysis, and death. I agree, on behalf of myself and on behalf of any minor child using the Transport Services for whom I am the parent or legal guardian, that the entire risk arising out of my use of the [employer] remains solely with me, to the maximum extent permitted under applicable law. The [employer] are provided "as  is" and "as available." [facility] disclaims all representations and warranties, express, implied or statutory, not expressly set out in these terms, including the implied warranties of merchantability and fitness for a particular purpose. I hereby waive and release [facility], its agents, employees, officers, directors, representatives, insurers, attorneys, assigns, successors, subsidiaries, and affiliates from any and all past, present, or future claims, demands, liabilities, actions, causes of action, or suits of any kind directly or indirectly arising from acceptance and use of the [employer]. I further waive and release [facility] and its affiliates from all present and future liability and responsibility for any injury or death to persons or damages to property caused by or related to the use of the [employer]. I have read this Waiver and Release of Liability, and I understand the terms used in it and their legal significance. This Waiver is freely and voluntarily given with the understanding that my right (as well as the right of any minor child for whom I am the parent or legal guardian using the [employer]) to legal recourse against  in connection with the [employer] is knowingly surrendered in return for use of these services.   I attest that I read the consent document to [name], gave [name] the opportunity to ask questions and answered the questions asked (if any). I affirm that [name] then provided consent for he's participation in this program.     [name]

## 2021-02-28 ENCOUNTER — Inpatient Hospital Stay: Payer: Medicare Other

## 2021-02-28 ENCOUNTER — Inpatient Hospital Stay (HOSPITAL_BASED_OUTPATIENT_CLINIC_OR_DEPARTMENT_OTHER): Payer: Medicare Other | Admitting: Internal Medicine

## 2021-02-28 ENCOUNTER — Other Ambulatory Visit: Payer: Self-pay

## 2021-02-28 VITALS — BP 112/82 | HR 70 | Temp 97.7°F | Resp 17 | Wt 149.4 lb

## 2021-02-28 DIAGNOSIS — C3492 Malignant neoplasm of unspecified part of left bronchus or lung: Secondary | ICD-10-CM

## 2021-02-28 DIAGNOSIS — C3412 Malignant neoplasm of upper lobe, left bronchus or lung: Secondary | ICD-10-CM

## 2021-02-28 DIAGNOSIS — Z5112 Encounter for antineoplastic immunotherapy: Secondary | ICD-10-CM

## 2021-02-28 DIAGNOSIS — C3490 Malignant neoplasm of unspecified part of unspecified bronchus or lung: Secondary | ICD-10-CM

## 2021-02-28 DIAGNOSIS — Z5111 Encounter for antineoplastic chemotherapy: Secondary | ICD-10-CM

## 2021-02-28 DIAGNOSIS — Z95828 Presence of other vascular implants and grafts: Secondary | ICD-10-CM

## 2021-02-28 LAB — CBC WITH DIFFERENTIAL (CANCER CENTER ONLY)
Abs Immature Granulocytes: 0.02 10*3/uL (ref 0.00–0.07)
Basophils Absolute: 0.1 10*3/uL (ref 0.0–0.1)
Basophils Relative: 1 %
Eosinophils Absolute: 0.5 10*3/uL (ref 0.0–0.5)
Eosinophils Relative: 6 %
HCT: 37.1 % — ABNORMAL LOW (ref 39.0–52.0)
Hemoglobin: 12.6 g/dL — ABNORMAL LOW (ref 13.0–17.0)
Immature Granulocytes: 0 %
Lymphocytes Relative: 16 %
Lymphs Abs: 1.1 10*3/uL (ref 0.7–4.0)
MCH: 33 pg (ref 26.0–34.0)
MCHC: 34 g/dL (ref 30.0–36.0)
MCV: 97.1 fL (ref 80.0–100.0)
Monocytes Absolute: 0.5 10*3/uL (ref 0.1–1.0)
Monocytes Relative: 8 %
Neutro Abs: 4.9 10*3/uL (ref 1.7–7.7)
Neutrophils Relative %: 69 %
Platelet Count: 195 10*3/uL (ref 150–400)
RBC: 3.82 MIL/uL — ABNORMAL LOW (ref 4.22–5.81)
RDW: 14.2 % (ref 11.5–15.5)
WBC Count: 7.1 10*3/uL (ref 4.0–10.5)
nRBC: 0 % (ref 0.0–0.2)

## 2021-02-28 LAB — CMP (CANCER CENTER ONLY)
ALT: <6 U/L (ref 0–44)
AST: 12 U/L — ABNORMAL LOW (ref 15–41)
Albumin: 3.5 g/dL (ref 3.5–5.0)
Alkaline Phosphatase: 95 U/L (ref 38–126)
Anion gap: 5 (ref 5–15)
BUN: 5 mg/dL — ABNORMAL LOW (ref 8–23)
CO2: 29 mmol/L (ref 22–32)
Calcium: 9.1 mg/dL (ref 8.9–10.3)
Chloride: 105 mmol/L (ref 98–111)
Creatinine: 0.74 mg/dL (ref 0.61–1.24)
GFR, Estimated: >60 mL/min (ref >60)
Glucose, Bld: 120 mg/dL — ABNORMAL HIGH (ref 70–99)
Potassium: 4.3 mmol/L (ref 3.5–5.1)
Sodium: 139 mmol/L (ref 135–145)
Total Bilirubin: 0.3 mg/dL (ref 0.3–1.2)
Total Protein: 6.5 g/dL (ref 6.5–8.1)

## 2021-02-28 LAB — TSH: TSH: 2.548 u[IU]/mL (ref 0.320–4.118)

## 2021-02-28 MED ORDER — SODIUM CHLORIDE 0.9% FLUSH
10.0000 mL | Freq: Once | INTRAVENOUS | Status: AC
  Administered 2021-02-28: 10 mL
  Filled 2021-02-28: qty 10

## 2021-02-28 MED ORDER — SODIUM CHLORIDE 0.9% FLUSH
10.0000 mL | INTRAVENOUS | Status: DC | PRN
  Administered 2021-02-28: 10 mL
  Filled 2021-02-28: qty 10

## 2021-02-28 MED ORDER — SODIUM CHLORIDE 0.9 % IV SOLN
200.0000 mg | Freq: Once | INTRAVENOUS | Status: AC
  Administered 2021-02-28: 200 mg via INTRAVENOUS
  Filled 2021-02-28: qty 8

## 2021-02-28 MED ORDER — HEPARIN SOD (PORK) LOCK FLUSH 100 UNIT/ML IV SOLN
500.0000 [IU] | Freq: Once | INTRAVENOUS | Status: AC | PRN
  Administered 2021-02-28: 500 [IU]
  Filled 2021-02-28: qty 5

## 2021-02-28 MED ORDER — SODIUM CHLORIDE 0.9 % IV SOLN
Freq: Once | INTRAVENOUS | Status: AC
  Filled 2021-02-28: qty 250
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
## 2021-02-28 NOTE — Patient Instructions (Signed)
[facility]  Discharge Instructions: ?Thank you for choosing [facility] to provide your oncology and hematology care.  ? ?If you have a lab appointment with the Cancer Center, please go directly to the Cancer Center and check in at the registration area. ?  ?Wear comfortable clothing and clothing appropriate for easy access to any Portacath or PICC line.  ? ?We strive to give you quality time with your provider. You may need to reschedule your appointment if you arrive late (15 or more minutes).  Arriving late affects you and other patients whose appointments are after yours.  Also, if you miss three or more appointments without notifying the office, you may be dismissed from the clinic at the provider?s discretion.    ?  ?For prescription refill requests, have your pharmacy contact our office and allow 72 hours for refills to be completed.   ? ?Today you received the following chemotherapy and/or immunotherapy agents: Keytruda ?  ?To help prevent nausea and vomiting after your treatment, we encourage you to take your nausea medication as directed. ? ?BELOW ARE SYMPTOMS THAT SHOULD BE REPORTED IMMEDIATELY: ?*FEVER GREATER THAN 100.4 F (38 ?C) OR HIGHER ?*CHILLS OR SWEATING ?*NAUSEA AND VOMITING THAT IS NOT CONTROLLED WITH YOUR NAUSEA MEDICATION ?*UNUSUAL SHORTNESS OF BREATH ?*UNUSUAL BRUISING OR BLEEDING ?*URINARY PROBLEMS (pain or burning when urinating, or frequent urination) ?*BOWEL PROBLEMS (unusual diarrhea, constipation, pain near the anus) ?TENDERNESS IN MOUTH AND THROAT WITH OR WITHOUT PRESENCE OF ULCERS (sore throat, sores in mouth, or a toothache) ?UNUSUAL RASH, SWELLING OR PAIN  ?UNUSUAL VAGINAL DISCHARGE OR ITCHING  ? ?Items with * indicate a potential emergency and should be followed up as soon as possible or go to the Emergency Department if any problems should occur. ? ?Please show the CHEMOTHERAPY ALERT CARD or IMMUNOTHERAPY ALERT CARD at check-in to the  Emergency Department and triage nurse. ? ?Should you have questions after your visit or need to cancel or reschedule your appointment, please contact [facility]  Dept: [phone]  and follow the prompts.  Office hours are 8:00 a.m. to 4:30 p.m. Monday - Friday. Please note that voicemails left after 4:00 p.m. may not be returned until the following business day.  We are closed weekends and major holidays. You have access to a nurse at all times for urgent questions. Please call the main number to the clinic Dept: [phone] and follow the prompts. ? ? ?For any non-urgent questions, you may also contact your provider using MyChart. We now offer e-Visits for anyone 18 and older to request care online for non-urgent symptoms. For details visit [email or web address]. ?  ?Also download the MyChart app! Go to the app store, search "MyChart", open the app, select [facility], and log in with your MyChart username and password. ? ?Due to Covid, a mask is required upon entering the hospital/clinic. If you do not have a mask, one will be given to you upon arrival. For doctor visits, patients may have 1 support person aged 18 or older with them. For treatment visits, patients cannot have anyone with them due to current Covid guidelines and our immunocompromised population.  ? ?

## 2021-03-19 ENCOUNTER — Other Ambulatory Visit: Payer: Medicare Other

## 2021-03-19 ENCOUNTER — Other Ambulatory Visit: Payer: Self-pay

## 2021-03-19 ENCOUNTER — Ambulatory Visit (HOSPITAL_COMMUNITY)
Admission: RE | Admit: 2021-03-19 | Discharge: 2021-03-19 | Disposition: A | Discharge status: Home / Self Care | Payer: Medicare Other | Source: Ambulatory Visit | Attending: Internal Medicine | Admitting: Internal Medicine

## 2021-03-19 ENCOUNTER — Inpatient Hospital Stay: Payer: Medicare Other | Attending: Internal Medicine

## 2021-03-19 DIAGNOSIS — C3490 Malignant neoplasm of unspecified part of unspecified bronchus or lung: Secondary | ICD-10-CM | POA: Insufficient documentation

## 2021-03-19 DIAGNOSIS — Z79899 Other long term (current) drug therapy: Secondary | ICD-10-CM | POA: Diagnosis not present

## 2021-03-19 DIAGNOSIS — C07 Malignant neoplasm of parotid gland: Secondary | ICD-10-CM | POA: Insufficient documentation

## 2021-03-19 DIAGNOSIS — C3412 Malignant neoplasm of upper lobe, left bronchus or lung: Secondary | ICD-10-CM | POA: Insufficient documentation

## 2021-03-19 DIAGNOSIS — Z5112 Encounter for antineoplastic immunotherapy: Secondary | ICD-10-CM | POA: Insufficient documentation

## 2021-03-19 DIAGNOSIS — Z95828 Presence of other vascular implants and grafts: Secondary | ICD-10-CM

## 2021-03-19 DIAGNOSIS — C3492 Malignant neoplasm of unspecified part of left bronchus or lung: Secondary | ICD-10-CM

## 2021-03-19 LAB — CMP (CANCER CENTER ONLY)
ALT: 6 U/L (ref 0–44)
AST: 10 U/L — ABNORMAL LOW (ref 15–41)
Albumin: 4 g/dL (ref 3.5–5.0)
Alkaline Phosphatase: 86 U/L (ref 38–126)
Anion gap: 13 (ref 5–15)
BUN: 5 mg/dL — ABNORMAL LOW (ref 8–23)
CO2: 24 mmol/L (ref 22–32)
Calcium: 9.5 mg/dL (ref 8.9–10.3)
Chloride: 104 mmol/L (ref 98–111)
Creatinine: 0.79 mg/dL (ref 0.61–1.24)
GFR, Estimated: >60 mL/min (ref >60)
Glucose, Bld: 114 mg/dL — ABNORMAL HIGH (ref 70–99)
Potassium: 4 mmol/L (ref 3.5–5.1)
Sodium: 141 mmol/L (ref 135–145)
Total Bilirubin: 0.6 mg/dL (ref 0.3–1.2)
Total Protein: 7.1 g/dL (ref 6.5–8.1)

## 2021-03-19 LAB — CBC WITH DIFFERENTIAL (CANCER CENTER ONLY)
Abs Immature Granulocytes: 0.03 10*3/uL (ref 0.00–0.07)
Basophils Absolute: 0.1 10*3/uL (ref 0.0–0.1)
Basophils Relative: 1 %
Eosinophils Absolute: 0.3 10*3/uL (ref 0.0–0.5)
Eosinophils Relative: 4 %
HCT: 40.3 % (ref 39.0–52.0)
Hemoglobin: 13.8 g/dL (ref 13.0–17.0)
Immature Granulocytes: 0 %
Lymphocytes Relative: 21 %
Lymphs Abs: 1.9 10*3/uL (ref 0.7–4.0)
MCH: 33.2 pg (ref 26.0–34.0)
MCHC: 34.2 g/dL (ref 30.0–36.0)
MCV: 96.9 fL (ref 80.0–100.0)
Monocytes Absolute: 0.7 10*3/uL (ref 0.1–1.0)
Monocytes Relative: 7 %
Neutro Abs: 6 10*3/uL (ref 1.7–7.7)
Neutrophils Relative %: 67 %
Platelet Count: 219 10*3/uL (ref 150–400)
RBC: 4.16 MIL/uL — ABNORMAL LOW (ref 4.22–5.81)
RDW: 13.8 % (ref 11.5–15.5)
WBC Count: 8.9 10*3/uL (ref 4.0–10.5)
nRBC: 0 % (ref 0.0–0.2)

## 2021-03-19 MED ORDER — SODIUM CHLORIDE 0.9% FLUSH
10.0000 mL | Freq: Once | INTRAVENOUS | Status: AC
  Administered 2021-03-19: 10 mL

## 2021-03-19 MED ORDER — HEPARIN SOD (PORK) LOCK FLUSH 100 UNIT/ML IV SOLN
INTRAVENOUS | Status: AC
  Administered 2021-03-19: 500 [IU] via INTRAVENOUS
  Filled 2021-03-19: qty 5

## 2021-03-19 MED ORDER — HEPARIN SOD (PORK) LOCK FLUSH 100 UNIT/ML IV SOLN: 500.0000 [IU] | Freq: Once | INTRAVENOUS | Status: AC

## 2021-03-19 MED ORDER — IOHEXOL 350 MG/ML SOLN
80.0000 mL | Freq: Once | INTRAVENOUS | Status: AC | PRN
  Administered 2021-03-19: 80 mL via INTRAVENOUS

## 2021-03-20 LAB — TSH: TSH: 1.82 u[IU]/mL (ref 0.320–4.118)

## 2021-03-21 ENCOUNTER — Inpatient Hospital Stay (HOSPITAL_BASED_OUTPATIENT_CLINIC_OR_DEPARTMENT_OTHER): Payer: Medicare Other | Admitting: Internal Medicine

## 2021-03-21 ENCOUNTER — Other Ambulatory Visit: Payer: Medicare Other

## 2021-03-21 ENCOUNTER — Inpatient Hospital Stay: Payer: Medicare Other

## 2021-03-21 ENCOUNTER — Other Ambulatory Visit: Payer: Self-pay

## 2021-03-21 VITALS — HR 99

## 2021-03-21 VITALS — BP 123/96 | HR 115 | Temp 96.9°F | Resp 18 | Ht 68.0 in | Wt 144.4 lb

## 2021-03-21 DIAGNOSIS — C3492 Malignant neoplasm of unspecified part of left bronchus or lung: Secondary | ICD-10-CM

## 2021-03-21 DIAGNOSIS — Z5112 Encounter for antineoplastic immunotherapy: Secondary | ICD-10-CM

## 2021-03-21 DIAGNOSIS — C3412 Malignant neoplasm of upper lobe, left bronchus or lung: Secondary | ICD-10-CM

## 2021-03-21 MED ORDER — SODIUM CHLORIDE 0.9 % IV SOLN
200.0000 mg | Freq: Once | INTRAVENOUS | Status: AC
  Administered 2021-03-21: 200 mg via INTRAVENOUS
  Filled 2021-03-21: qty 8

## 2021-03-21 MED ORDER — HEPARIN SOD (PORK) LOCK FLUSH 100 UNIT/ML IV SOLN
500.0000 [IU] | Freq: Once | INTRAVENOUS | Status: AC | PRN
  Administered 2021-03-21: 500 [IU]

## 2021-03-21 MED ORDER — SODIUM CHLORIDE 0.9% FLUSH
10.0000 mL | INTRAVENOUS | Status: DC | PRN
  Administered 2021-03-21: 10 mL

## 2021-03-21 MED ORDER — SODIUM CHLORIDE 0.9 % IV SOLN: Freq: Once | INTRAVENOUS | Status: AC

## 2021-03-21 NOTE — Patient Instructions (Signed)
[facility]  Discharge Instructions: ?Thank you for choosing [facility] to provide your oncology and hematology care.  ? ?If you have a lab appointment with the Cancer Center, please go directly to the Cancer Center and check in at the registration area. ?  ?Wear comfortable clothing and clothing appropriate for easy access to any Portacath or PICC line.  ? ?We strive to give you quality time with your provider. You may need to reschedule your appointment if you arrive late (15 or more minutes).  Arriving late affects you and other patients whose appointments are after yours.  Also, if you miss three or more appointments without notifying the office, you may be dismissed from the clinic at the provider?s discretion.    ?  ?For prescription refill requests, have your pharmacy contact our office and allow 72 hours for refills to be completed.   ? ?Today you received the following chemotherapy and/or immunotherapy agents: Keytruda ?  ?To help prevent nausea and vomiting after your treatment, we encourage you to take your nausea medication as directed. ? ?BELOW ARE SYMPTOMS THAT SHOULD BE REPORTED IMMEDIATELY: ?*FEVER GREATER THAN 100.4 F (38 ?C) OR HIGHER ?*CHILLS OR SWEATING ?*NAUSEA AND VOMITING THAT IS NOT CONTROLLED WITH YOUR NAUSEA MEDICATION ?*UNUSUAL SHORTNESS OF BREATH ?*UNUSUAL BRUISING OR BLEEDING ?*URINARY PROBLEMS (pain or burning when urinating, or frequent urination) ?*BOWEL PROBLEMS (unusual diarrhea, constipation, pain near the anus) ?TENDERNESS IN MOUTH AND THROAT WITH OR WITHOUT PRESENCE OF ULCERS (sore throat, sores in mouth, or a toothache) ?UNUSUAL RASH, SWELLING OR PAIN  ?UNUSUAL VAGINAL DISCHARGE OR ITCHING  ? ?Items with * indicate a potential emergency and should be followed up as soon as possible or go to the Emergency Department if any problems should occur. ? ?Please show the CHEMOTHERAPY ALERT CARD or IMMUNOTHERAPY ALERT CARD at check-in to the  Emergency Department and triage nurse. ? ?Should you have questions after your visit or need to cancel or reschedule your appointment, please contact  [facility] CENTER MEDICAL ONCOL[facility]  Dept: [phone]  and follow the prompts.  Office hours are 8:00 a.m. to 4:30 p.m. Monday - Friday. Please note that voicemails left after 4:00 p.m. may not be returned until the following business day.  We are closed weekends and major holidays. You have access to a nurse at all times for urgent questions. Please call the main number to the clinic Dept: [phone] and follow the prompts. ? ? ?For any non-urgent questions, you may also contact your provider using MyChart. We now offer e-Visits for anyone 18 and older to request care online for non-urgent symptoms. For details visit [email or web address]. ?  ?Also download the MyChart app! Go to the app store, search "MyChart", open the app, select , and log in with your MyChart username and password. ? ?Due to Covid, a mask is required upon entering the hospital/clinic. If you do not have a mask, one will be given to you upon arrival. For doctor visits, patients may have 1 support person aged 18 or older with them. For treatment visits, patients cannot have anyone with them due to current Covid guidelines and our immunocompromised population.  ? ?
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
## 2021-04-11 ENCOUNTER — Inpatient Hospital Stay: Payer: Medicare Other | Attending: Internal Medicine | Admitting: Physician Assistant

## 2021-04-11 ENCOUNTER — Inpatient Hospital Stay: Payer: Medicare Other

## 2021-04-11 ENCOUNTER — Other Ambulatory Visit: Payer: Self-pay

## 2021-04-11 VITALS — BP 98/73 | HR 77 | Temp 96.7°F | Resp 18 | Ht 68.0 in | Wt 146.6 lb

## 2021-04-11 DIAGNOSIS — C07 Malignant neoplasm of parotid gland: Secondary | ICD-10-CM | POA: Insufficient documentation

## 2021-04-11 DIAGNOSIS — Z79899 Other long term (current) drug therapy: Secondary | ICD-10-CM | POA: Diagnosis not present

## 2021-04-11 DIAGNOSIS — F17210 Nicotine dependence, cigarettes, uncomplicated: Secondary | ICD-10-CM | POA: Insufficient documentation

## 2021-04-11 DIAGNOSIS — Z5112 Encounter for antineoplastic immunotherapy: Secondary | ICD-10-CM | POA: Diagnosis not present

## 2021-04-11 DIAGNOSIS — C3492 Malignant neoplasm of unspecified part of left bronchus or lung: Secondary | ICD-10-CM

## 2021-04-11 DIAGNOSIS — C3412 Malignant neoplasm of upper lobe, left bronchus or lung: Secondary | ICD-10-CM | POA: Diagnosis present

## 2021-04-11 DIAGNOSIS — Z95828 Presence of other vascular implants and grafts: Secondary | ICD-10-CM

## 2021-04-11 LAB — CMP (CANCER CENTER ONLY)
ALT: <6 U/L (ref 0–44)
AST: 8 U/L — ABNORMAL LOW (ref 15–41)
Albumin: 3.7 g/dL (ref 3.5–5.0)
Alkaline Phosphatase: 88 U/L (ref 38–126)
Anion gap: 10 (ref 5–15)
BUN: 6 mg/dL — ABNORMAL LOW (ref 8–23)
CO2: 27 mmol/L (ref 22–32)
Calcium: 9.1 mg/dL (ref 8.9–10.3)
Chloride: 101 mmol/L (ref 98–111)
Creatinine: 0.79 mg/dL (ref 0.61–1.24)
GFR, Estimated: >60 mL/min (ref >60)
Glucose, Bld: 102 mg/dL — ABNORMAL HIGH (ref 70–99)
Potassium: 3.7 mmol/L (ref 3.5–5.1)
Sodium: 138 mmol/L (ref 135–145)
Total Bilirubin: 0.4 mg/dL (ref 0.3–1.2)
Total Protein: 6.4 g/dL — ABNORMAL LOW (ref 6.5–8.1)

## 2021-04-11 LAB — CBC WITH DIFFERENTIAL (CANCER CENTER ONLY)
Abs Immature Granulocytes: 0.02 10*3/uL (ref 0.00–0.07)
Basophils Absolute: 0.1 10*3/uL (ref 0.0–0.1)
Basophils Relative: 1 %
Eosinophils Absolute: 0.4 10*3/uL (ref 0.0–0.5)
Eosinophils Relative: 5 %
HCT: 36.1 % — ABNORMAL LOW (ref 39.0–52.0)
Hemoglobin: 12.4 g/dL — ABNORMAL LOW (ref 13.0–17.0)
Immature Granulocytes: 0 %
Lymphocytes Relative: 18 %
Lymphs Abs: 1.5 10*3/uL (ref 0.7–4.0)
MCH: 32.9 pg (ref 26.0–34.0)
MCHC: 34.3 g/dL (ref 30.0–36.0)
MCV: 95.8 fL (ref 80.0–100.0)
Monocytes Absolute: 0.6 10*3/uL (ref 0.1–1.0)
Monocytes Relative: 7 %
Neutro Abs: 5.9 10*3/uL (ref 1.7–7.7)
Neutrophils Relative %: 69 %
Platelet Count: 177 10*3/uL (ref 150–400)
RBC: 3.77 MIL/uL — ABNORMAL LOW (ref 4.22–5.81)
RDW: 13.2 % (ref 11.5–15.5)
WBC Count: 8.5 10*3/uL (ref 4.0–10.5)
nRBC: 0 % (ref 0.0–0.2)

## 2021-04-11 LAB — TSH: TSH: 1.487 u[IU]/mL (ref 0.320–4.118)

## 2021-04-11 MED ORDER — SODIUM CHLORIDE 0.9% FLUSH
10.0000 mL | INTRAVENOUS | Status: DC | PRN
  Administered 2021-04-11: 10 mL

## 2021-04-11 MED ORDER — SODIUM CHLORIDE 0.9 % IV SOLN
200.0000 mg | Freq: Once | INTRAVENOUS | Status: AC
  Administered 2021-04-11: 200 mg via INTRAVENOUS
  Filled 2021-04-11: qty 8

## 2021-04-11 MED ORDER — HEPARIN SOD (PORK) LOCK FLUSH 100 UNIT/ML IV SOLN
500.0000 [IU] | Freq: Once | INTRAVENOUS | Status: AC | PRN
  Administered 2021-04-11: 500 [IU]

## 2021-04-11 MED ORDER — SODIUM CHLORIDE 0.9% FLUSH
10.0000 mL | Freq: Once | INTRAVENOUS | Status: AC
  Administered 2021-04-11: 10 mL

## 2021-04-11 MED ORDER — SODIUM CHLORIDE 0.9 % IV SOLN: Freq: Once | INTRAVENOUS | Status: AC

## 2021-04-11 NOTE — Patient Instructions (Signed)
[facility] MEDICAL ONCOLOGY  Discharge Instructions: ?Thank you for choosing [facility] to provide your oncology and hematology care.  ? ?If you have a lab appointment with the Cancer Center, please go directly to the Cancer Center and check in at the registration area. ?  ?Wear comfortable clothing and clothing appropriate for easy access to any Portacath or PICC line.  ? ?We strive to give you quality time with your provider. You may need to reschedule your appointment if you arrive late (15 or more minutes).  Arriving late affects you and other patients whose appointments are after yours.  Also, if you miss three or more appointments without notifying the office, you may be dismissed from the clinic at the provider?s discretion.    ?  ?For prescription refill requests, have your pharmacy contact our office and allow 72 hours for refills to be completed.   ? ?Today you received the following chemotherapy and/or immunotherapy agents: Keytruda ?  ?To help prevent nausea and vomiting after your treatment, we encourage you to take your nausea medication as directed. ? ?BELOW ARE SYMPTOMS THAT SHOULD BE REPORTED IMMEDIATELY: ?*FEVER GREATER THAN 100.4 F (38 ?C) OR HIGHER ?*CHILLS OR SWEATING ?*NAUSEA AND VOMITING THAT IS NOT CONTROLLED WITH YOUR NAUSEA MEDICATION ?*UNUSUAL SHORTNESS OF BREATH ?*UNUSUAL BRUISING OR BLEEDING ?*URINARY PROBLEMS (pain or burning when urinating, or frequent urination) ?*BOWEL PROBLEMS (unusual diarrhea, constipation, pain near the anus) ?TENDERNESS IN MOUTH AND THROAT WITH OR WITHOUT PRESENCE OF ULCERS (sore throat, sores in mouth, or a toothache) ?UNUSUAL RASH, SWELLING OR PAIN  ?UNUSUAL VAGINAL DISCHARGE OR ITCHING  ? ?Items with * indicate a potential emergency and should be followed up as soon as possible or go to the Emergency Department if any problems should occur. ? ?Please show the CHEMOTHERAPY ALERT CARD or IMMUNOTHERAPY ALERT CARD at check-in to the  Emergency Department and triage nurse. ? ?Should you have questions after your visit or need to cancel or reschedule your appointment, please contact [facility]  Dept: [phone]  and follow the prompts.  Office hours are 8:00 a.m. to 4:30 p.m. Monday - Friday. Please note that voicemails left after 4:00 p.m. may not be returned until the following business day.  We are closed weekends and major holidays. You have access to a nurse at all times for urgent questions. Please call the main number to the clinic Dept: [phone] and follow the prompts. ? ? ?For any non-urgent questions, you may also contact your provider using MyChart. We now offer e-Visits for anyone 18 and older to request care online for non-urgent symptoms. For details visit [email or web address]. ?  ?Also download the MyChart app! Go to the app store, search "MyChart", open the app, select [facility], and log in with your MyChart username and password. ? ?Due to Covid, a mask is required upon entering the hospital/clinic. If you do not have a mask, one will be given to you upon arrival. For doctor visits, patients may have 1 support person aged 18 or older with them. For treatment visits, patients cannot have anyone with them due to current Covid guidelines and our immunocompromised population.  ? ?

## 2021-04-26 ENCOUNTER — Other Ambulatory Visit: Payer: Self-pay | Admitting: Physician Assistant

## 2021-04-26 DIAGNOSIS — C3492 Malignant neoplasm of unspecified part of left bronchus or lung: Secondary | ICD-10-CM
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
## 2021-05-02 ENCOUNTER — Inpatient Hospital Stay: Payer: Medicare Other

## 2021-05-02 ENCOUNTER — Inpatient Hospital Stay (HOSPITAL_BASED_OUTPATIENT_CLINIC_OR_DEPARTMENT_OTHER): Payer: Medicare Other | Admitting: Physician Assistant

## 2021-05-02 ENCOUNTER — Other Ambulatory Visit: Payer: Self-pay

## 2021-05-02 VITALS — BP 102/77 | HR 78 | Temp 97.5°F | Resp 18 | Ht 68.0 in | Wt 150.0 lb

## 2021-05-02 DIAGNOSIS — Z95828 Presence of other vascular implants and grafts: Secondary | ICD-10-CM

## 2021-05-02 DIAGNOSIS — Z5112 Encounter for antineoplastic immunotherapy: Secondary | ICD-10-CM | POA: Diagnosis not present

## 2021-05-02 DIAGNOSIS — C3492 Malignant neoplasm of unspecified part of left bronchus or lung: Secondary | ICD-10-CM | POA: Diagnosis not present

## 2021-05-02 DIAGNOSIS — C3412 Malignant neoplasm of upper lobe, left bronchus or lung: Secondary | ICD-10-CM

## 2021-05-02 LAB — CBC WITH DIFFERENTIAL (CANCER CENTER ONLY)
Abs Immature Granulocytes: 0.03 10*3/uL (ref 0.00–0.07)
Basophils Absolute: 0.1 10*3/uL (ref 0.0–0.1)
Basophils Relative: 1 %
Eosinophils Absolute: 0.4 10*3/uL (ref 0.0–0.5)
Eosinophils Relative: 5 %
HCT: 35.3 % — ABNORMAL LOW (ref 39.0–52.0)
Hemoglobin: 12 g/dL — ABNORMAL LOW (ref 13.0–17.0)
Immature Granulocytes: 0 %
Lymphocytes Relative: 19 %
Lymphs Abs: 1.4 10*3/uL (ref 0.7–4.0)
MCH: 33 pg (ref 26.0–34.0)
MCHC: 34 g/dL (ref 30.0–36.0)
MCV: 97 fL (ref 80.0–100.0)
Monocytes Absolute: 0.5 10*3/uL (ref 0.1–1.0)
Monocytes Relative: 6 %
Neutro Abs: 5.1 10*3/uL (ref 1.7–7.7)
Neutrophils Relative %: 69 %
Platelet Count: 193 10*3/uL (ref 150–400)
RBC: 3.64 MIL/uL — ABNORMAL LOW (ref 4.22–5.81)
RDW: 13.4 % (ref 11.5–15.5)
WBC Count: 7.4 10*3/uL (ref 4.0–10.5)
nRBC: 0 % (ref 0.0–0.2)

## 2021-05-02 LAB — CMP (CANCER CENTER ONLY)
ALT: <5 U/L (ref 0–44)
AST: 8 U/L — ABNORMAL LOW (ref 15–41)
Albumin: 3.5 g/dL (ref 3.5–5.0)
Alkaline Phosphatase: 93 U/L (ref 38–126)
Anion gap: 10 (ref 5–15)
BUN: <4 mg/dL — ABNORMAL LOW (ref 8–23)
CO2: 25 mmol/L (ref 22–32)
Calcium: 8.8 mg/dL — ABNORMAL LOW (ref 8.9–10.3)
Chloride: 105 mmol/L (ref 98–111)
Creatinine: 0.76 mg/dL (ref 0.61–1.24)
GFR, Estimated: >60 mL/min (ref >60)
Glucose, Bld: 161 mg/dL — ABNORMAL HIGH (ref 70–99)
Potassium: 3.6 mmol/L (ref 3.5–5.1)
Sodium: 140 mmol/L (ref 135–145)
Total Bilirubin: 0.4 mg/dL (ref 0.3–1.2)
Total Protein: 6.1 g/dL — ABNORMAL LOW (ref 6.5–8.1)

## 2021-05-02 LAB — TSH: TSH: 1.02 u[IU]/mL (ref 0.320–4.118)

## 2021-05-02 MED ORDER — SODIUM CHLORIDE 0.9% FLUSH
10.0000 mL | Freq: Once | INTRAVENOUS | Status: AC
  Administered 2021-05-02: 10 mL

## 2021-05-02 MED ORDER — SODIUM CHLORIDE 0.9 % IV SOLN
200.0000 mg | Freq: Once | INTRAVENOUS | Status: AC
  Administered 2021-05-02: 200 mg via INTRAVENOUS
  Filled 2021-05-02: qty 8

## 2021-05-02 MED ORDER — SODIUM CHLORIDE 0.9 % IV SOLN: Freq: Once | INTRAVENOUS | Status: AC

## 2021-05-02 NOTE — Patient Instructions (Signed)
[facility]COL[facility]  Discharge Instructions: ?Thank you for choosing [facility] to provide your oncology and hematology care.  ? ?If you have a lab appointment with the Cancer Center, please go directly to the Cancer Center and check in at the registration area. ?  ?Wear comfortable clothing and clothing appropriate for easy access to any Portacath or PICC line.  ? ?We strive to give you quality time with your provider. You may need to reschedule your appointment if you arrive late (15 or more minutes).  Arriving late affects you and other patients whose appointments are after yours.  Also, if you miss three or more appointments without notifying the office, you may be dismissed from the clinic at the provider?s discretion.    ?  ?For prescription refill requests, have your pharmacy contact our office and allow 72 hours for refills to be completed.   ? ?Today you received the following chemotherapy and/or immunotherapy agents: Keytruda ?  ?To help prevent nausea and vomiting after your treatment, we encourage you to take your nausea medication as directed. ? ?BELOW ARE SYMPTOMS THAT SHOULD BE REPORTED IMMEDIATELY: ?*FEVER GREATER THAN 100.4 F (38 ?C) OR HIGHER ?*CHILLS OR SWEATING ?*NAUSEA AND VOMITING THAT IS NOT CONTROLLED WITH YOUR NAUSEA MEDICATION ?*UNUSUAL SHORTNESS OF BREATH ?*UNUSUAL BRUISING OR BLEEDING ?*URINARY PROBLEMS (pain or burning when urinating, or frequent urination) ?*BOWEL PROBLEMS (unusual diarrhea, constipation, pain near the anus) ?TENDERNESS IN MOUTH AND THROAT WITH OR WITHOUT PRESENCE OF ULCERS (sore throat, sores in mouth, or a toothache) ?UNUSUAL RASH, SWELLING OR PAIN  ?UNUSUAL VAGINAL DISCHARGE OR ITCHING  ? ?Items with * indicate a potential emergency and should be followed up as soon as possible or go to the Emergency Department if any problems should occur. ? ?Please show the CHEMOTHERAPY ALERT CARD or IMMUNOTHERAPY ALERT CARD at check-in to the  Emergency Department and triage nurse. ? ?Should you have questions after your visit or need to cancel or reschedule your appointment, please contact [facility]  Dept: [phone]  and follow the prompts.  Office hours are 8:00 a.m. to 4:30 p.m. Monday - Friday. Please note that voicemails left after 4:00 p.m. may not be returned until the following business day.  We are closed weekends and major holidays. You have access to a nurse at all times for urgent questions. Please call the main number to the clinic Dept: [phone] and follow the prompts. ? ? ?For any non-urgent questions, you may also contact your provider using MyChart. We now offer e-Visits for anyone 18 and older to request care online for non-urgent symptoms. For details visit [email or web address]. ?  ?Also download the MyChart app! Go to the app store, search "MyChart", open the app, select [facility], and log in with your MyChart username and password. ? ?Due to Covid, a mask is required upon entering the hospital/clinic. If you do not have a mask, one will be given to you upon arrival. For doctor visits, patients may have 1 support person aged 18 or older with them. For treatment visits, patients cannot have anyone with them due to current Covid guidelines and our immunocompromised population.  ? ?

## 2021-05-13 IMAGING — CT CT CHEST W/ CM
2 of 5 series · 12 of 36 positions shown, 15 images · IV contrast (APPLIED)
Comparison: 10/26/2019 PET-CT.  Most recent CT chest 01/23/2019.

CLINICAL DATA: Primary Cancer Type: Lung
TECHNIQUE: Multidetector CT imaging of the chest, abdomen and pelvis was
performed following the standard protocol during bolus
administration of intravenous contrast.

CONTRAST:  100mL OMNIPAQUE IOHEXOL 300 MG/ML  SOLN

[Series 2: cap with · axial · 0.76mm/px · z∈[-694,-174]mm · 9 of 132 slices shown, 12 images]
[im 14/132  mediastinal]
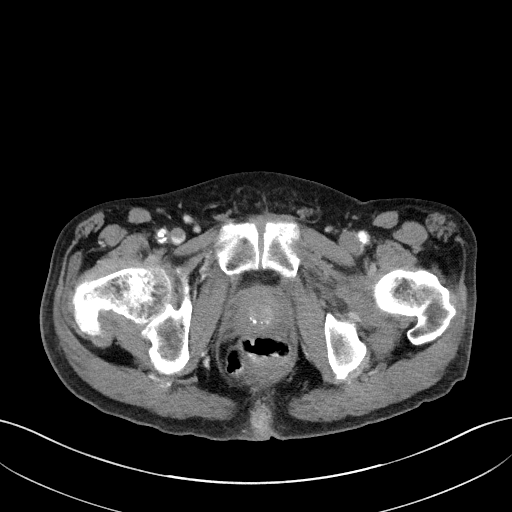
[im 14/132  lung]
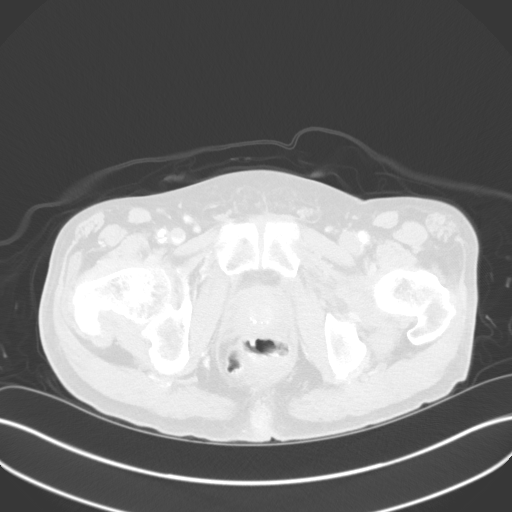
[im 27/132  lung]
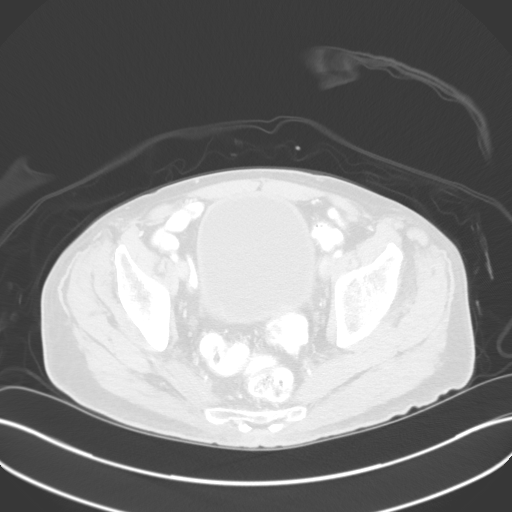
[im 40/132  lung]
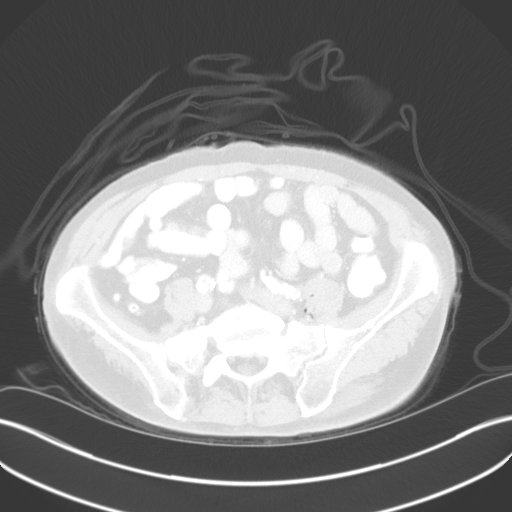
[im 53/132  lung]
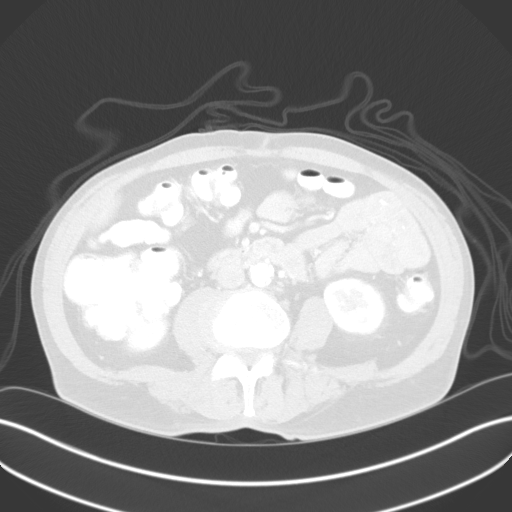
[im 66/132  mediastinal]
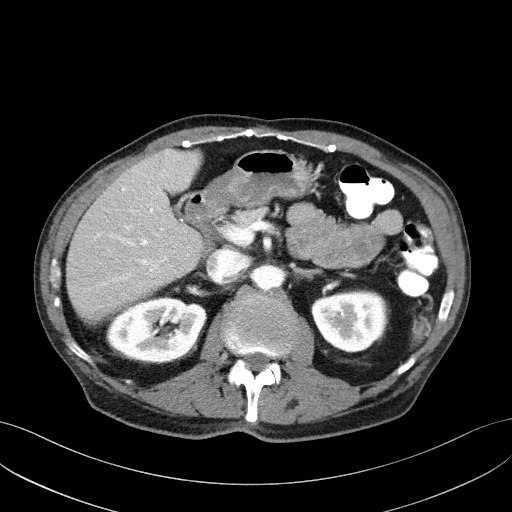
[im 66/132  lung]
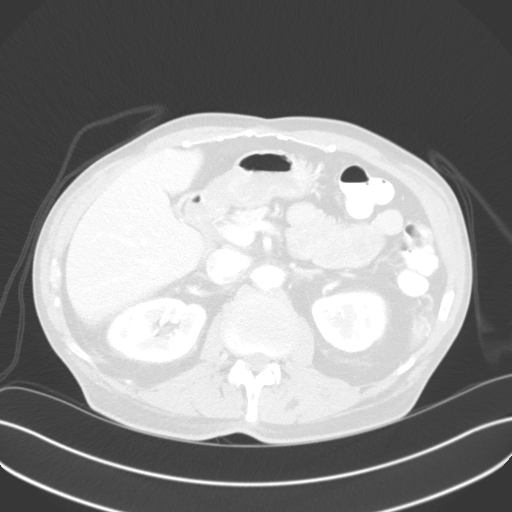
[im 79/132  lung]
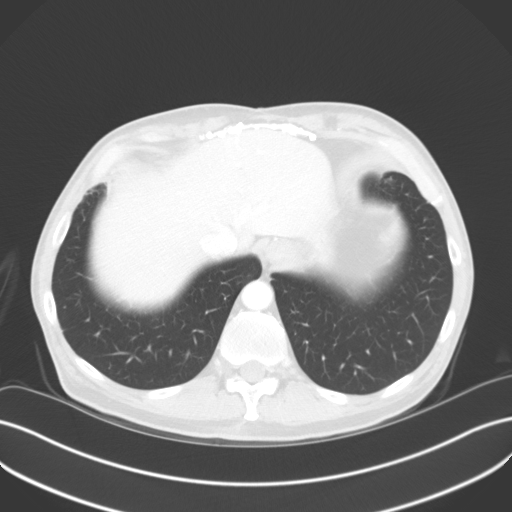
[im 92/132  lung]
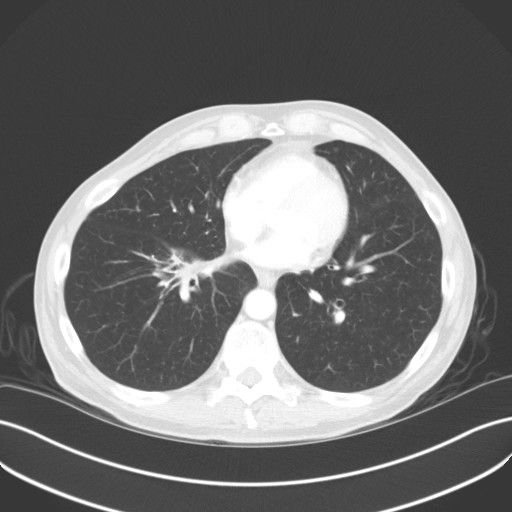
[im 105/132  lung]
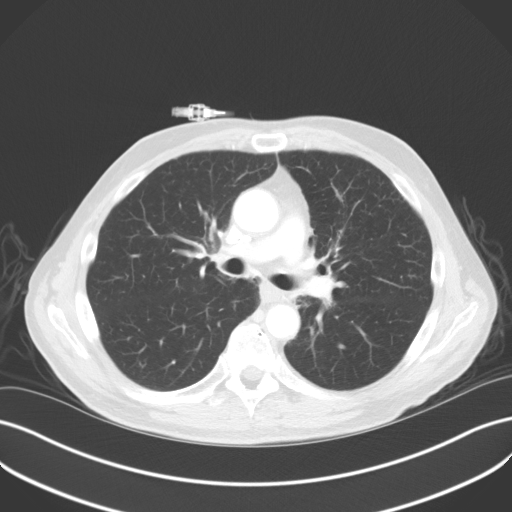
[im 118/132  mediastinal]
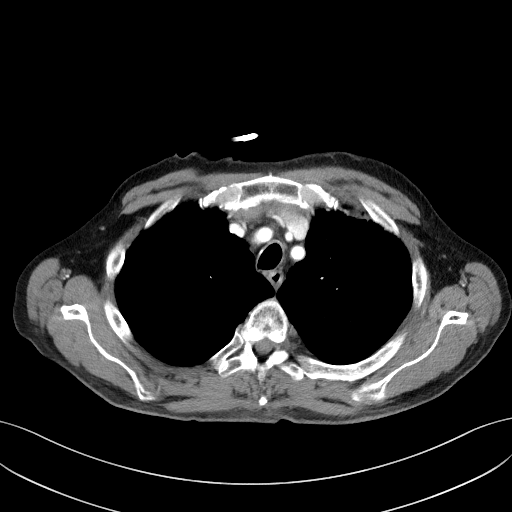
[im 118/132  lung]
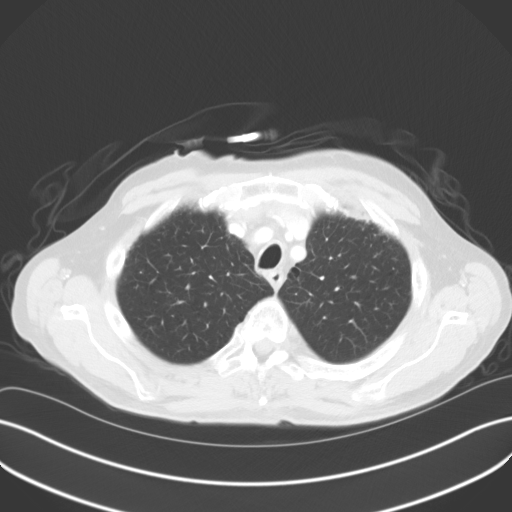

[Series 6: coronals · coronal · 0.77mm/px · 3 of 129 slices shown]
[im 26/129  lung]
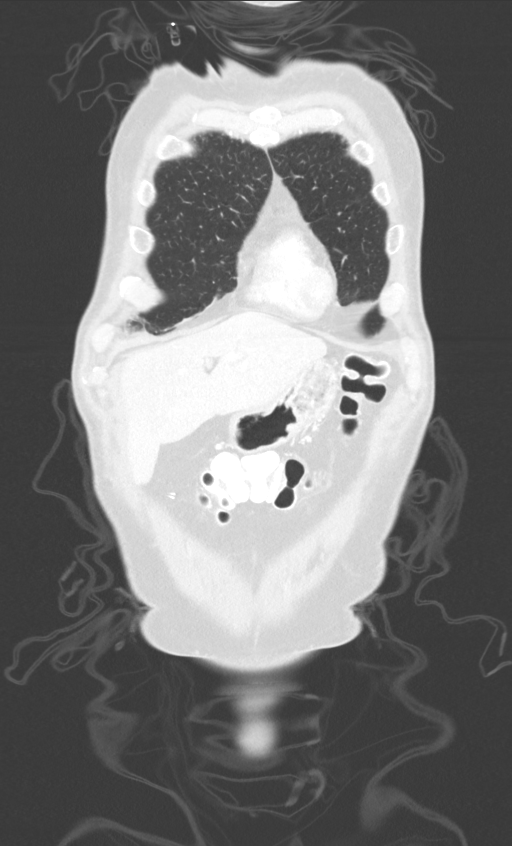
[im 52/129  lung]
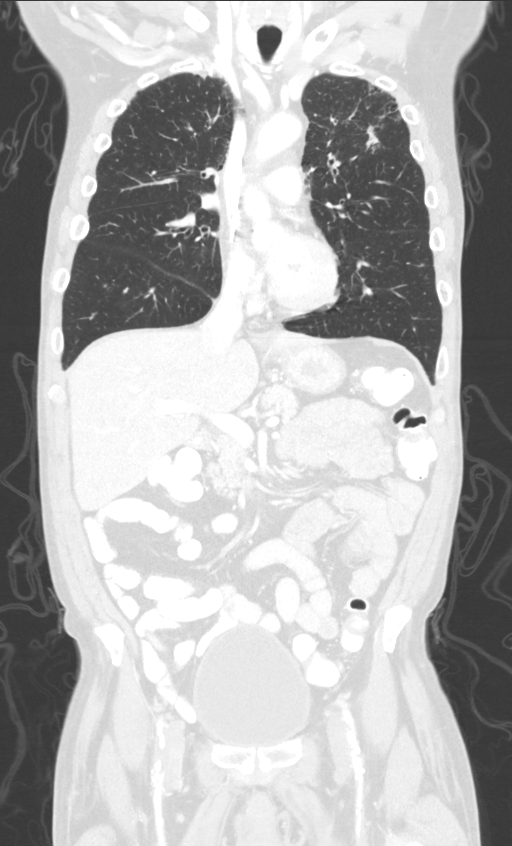
[im 77/129  lung]
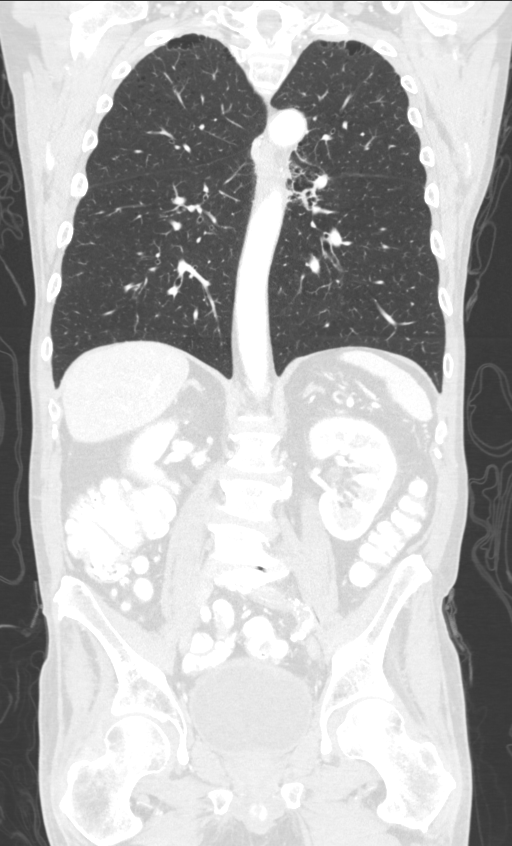

[12 of 36 positions shown; findings below may reference images not displayed]

Imaging Indication: Assess response to therapy

Interval therapy since last imaging? Yes

Initial Cancer Diagnosis

Date: 02/01/2019; established by: Biopsy-proven

Detailed Pathology: Stage IIIA metastatic non-small cell lung
cancer, adenocarcinoma. Squamous cell carcinoma of the right
neck/parotid gland.

Primary Tumor location: Left upper lobe.

Surgeries: Right parotidectomy for cancer of parotid gland
03/16/2019.

Chemotherapy: Yes; Ongoing? Yes; Most recent administration:
01/26/2020

Immunotherapy?  Yes; Type: Keytruda; Ongoing? Yes

Radiation therapy?  Yes; Date Range: 05/10/2019 - 06/23/2019;

Target: Left lung and right parotid.

EXAM:
CT CHEST, ABDOMEN, AND PELVIS WITH CONTRAST
FINDINGS: CT CHEST FINDINGS

Cardiovascular: Normal heart size. No significant pericardial
effusion/thickening. Right internal jugular Port-A-Cath terminates
at the cavoatrial junction. Atherosclerotic nonaneurysmal thoracic
aorta. Normal caliber pulmonary arteries. No central pulmonary
emboli.

Mediastinum/Nodes: No discrete thyroid nodules. Unremarkable
esophagus. No pathologically enlarged axillary, mediastinal or hilar
lymph nodes.

Lungs/Pleura: No pneumothorax. No pleural effusion. Mild
centrilobular and paraseptal emphysema with mild diffuse bronchial
wall thickening. Stable tiny calcified anterior right middle lobe 2
mm granuloma. Peripheral right lower 4 mm solid pulmonary nodule
(series 5/image 124), new. Apical left upper lobe 3 mm solid
pulmonary nodule (series 5/image 26), new. Irregular anterior left
lower lobe 2.2 x 1.7 cm pulmonary nodule (series 5/image 53),
decreased from 2.8 x 2.1 cm on 01/23/2019 CT using similar
measurement technique. Stable 7 mm left perifissural nodule (series
5/image 93).

Musculoskeletal: No aggressive appearing focal osseous lesions.
Moderate thoracic spondylosis.

CT ABDOMEN PELVIS FINDINGS

Hepatobiliary: Normal liver with no liver mass. Cholecystectomy. No
biliary ductal dilatation.

Pancreas: Normal, with no mass or duct dilation.

Spleen: Normal size. No mass.

Adrenals/Urinary Tract: Normal adrenals. Normal kidneys with no
hydronephrosis and no renal mass. Layering 3 mm stone in right
bladder, new. Chronic mild diffuse bladder wall thickening is
unchanged.

Stomach/Bowel: Normal non-distended stomach. Normal caliber small
bowel with no small bowel wall thickening. Oral contrast transits
the rectum. Normal appendix. Normal large bowel with no
diverticulosis, large bowel wall thickening or pericolonic fat
stranding.

Vascular/Lymphatic: Atherosclerotic nonaneurysmal abdominal aorta.
Patent portal, splenic, hepatic and renal veins. No pathologically
enlarged lymph nodes in the abdomen or pelvis.

Reproductive: Mildly enlarged prostate with nonspecific internal
prostatic calcifications.

Other: No pneumoperitoneum, ascites or focal fluid collection.
Ventral upper abdominal hernia repair mesh without recurrent hernia.

Musculoskeletal: No aggressive appearing focal osseous lesions.
Marked lower thoracic spondylosis.
IMPRESSION: 1. Primary pulmonary neoplasm in the anterior left upper lobe is
decreased.
2. Two new tiny pulmonary nodules measuring 4 mm in the right lower
lobe and 3 mm in the apical left upper lobe, indeterminate for
pulmonary metastases. Follow-up chest CT in 3 months recommended.
These nodules are below PET resolution.
3. No additional potential sites of metastatic disease in the chest,
abdomen and pelvis.
4. New 3 mm layering bladder stone.  No hydronephrosis.
5. Aortic Atherosclerosis (I8WVP-WX5.5) and Emphysema (I8WVP-FFK.1).

## 2021-05-13 IMAGING — CT CT ABD-PELV W/ CM
2 of 4 series · 12 of 37 positions shown, 15 images · IV contrast (APPLIED)
Comparison: 10/26/2019 PET-CT.  Most recent CT chest 01/23/2019.

CLINICAL DATA: Primary Cancer Type: Lung
TECHNIQUE: Multidetector CT imaging of the chest, abdomen and pelvis was
performed following the standard protocol during bolus
administration of intravenous contrast.

CONTRAST:  100mL OMNIPAQUE IOHEXOL 300 MG/ML  SOLN

[Series 2: cap with · axial · 0.76mm/px · z∈[-694,-174]mm · 9 of 132 slices shown, 12 images]
[im 14/132  mediastinal]
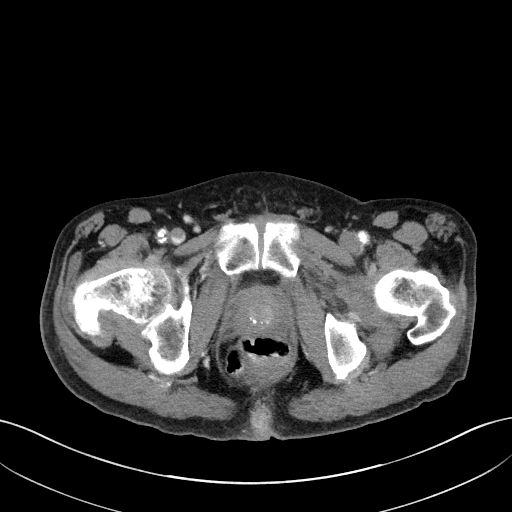
[im 14/132  lung]
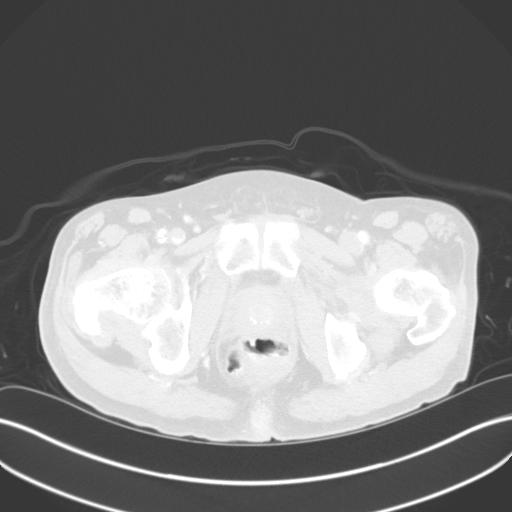
[im 27/132  lung]
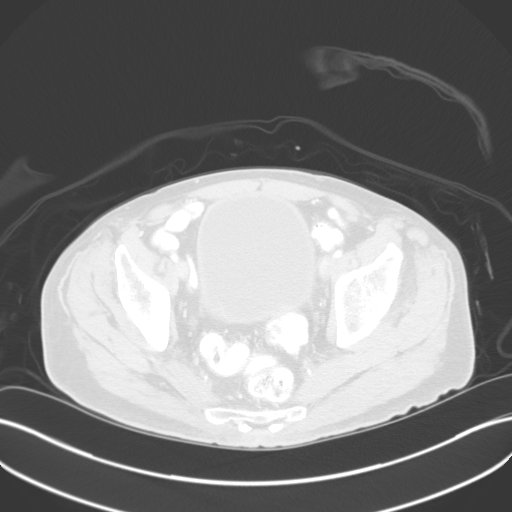
[im 40/132  lung]
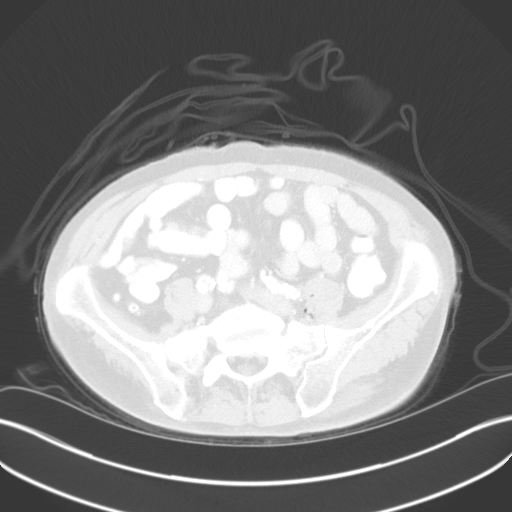
[im 53/132  lung]
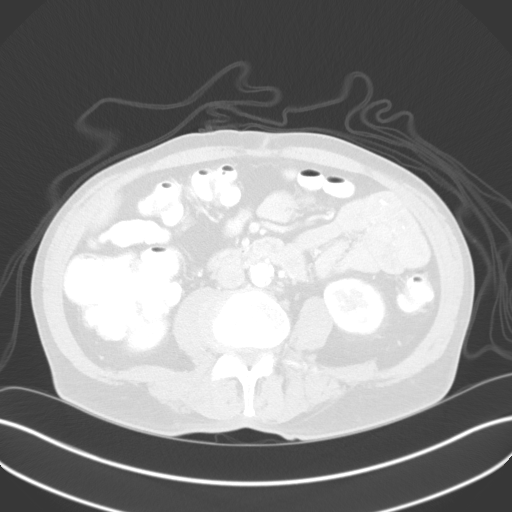
[im 66/132  mediastinal]
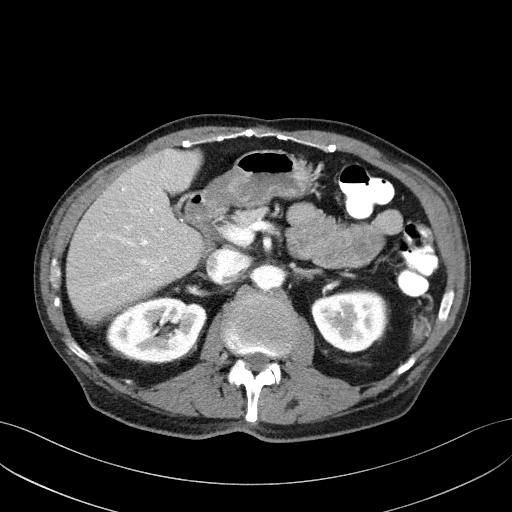
[im 66/132  lung]
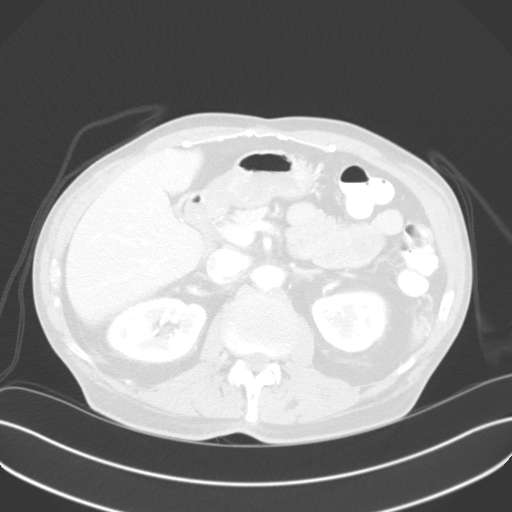
[im 79/132  lung]
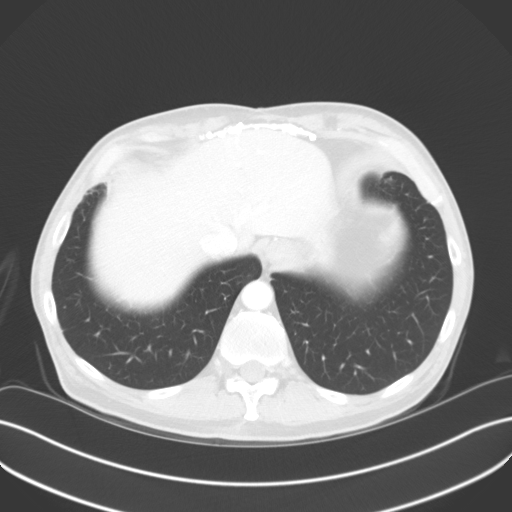
[im 92/132  lung]
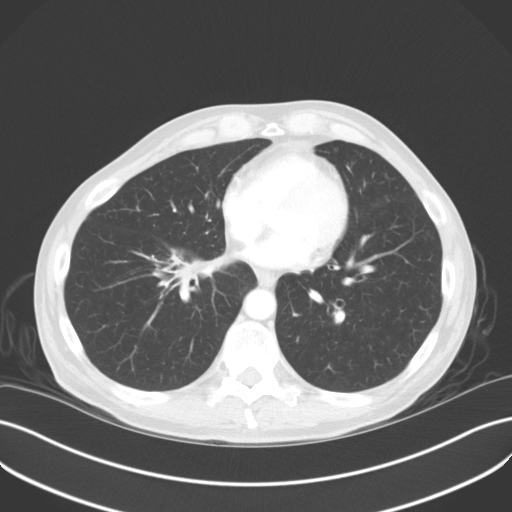
[im 105/132  lung]
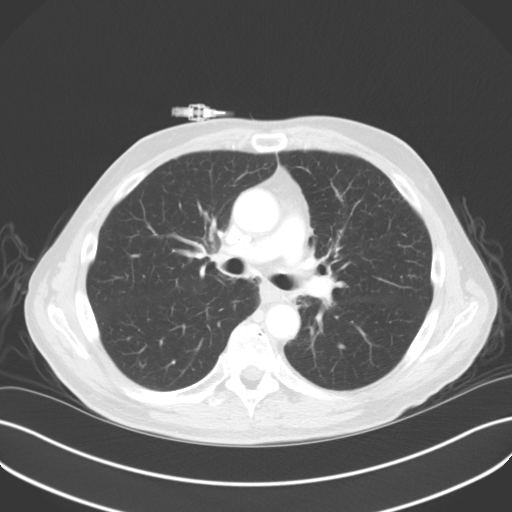
[im 118/132  mediastinal]
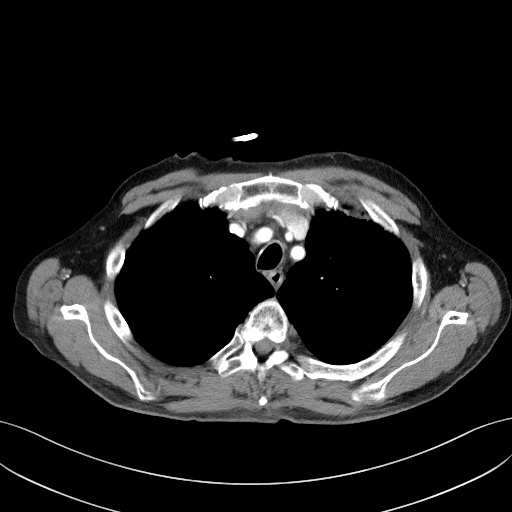
[im 118/132  lung]
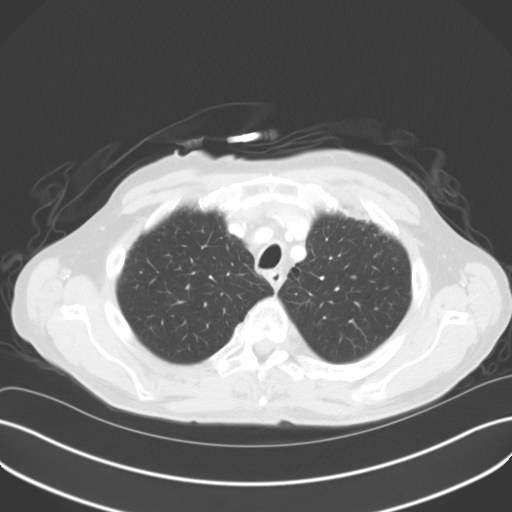

[Series 6: coronals · coronal · 0.77mm/px · 3 of 129 slices shown]
[im 26/129  lung]
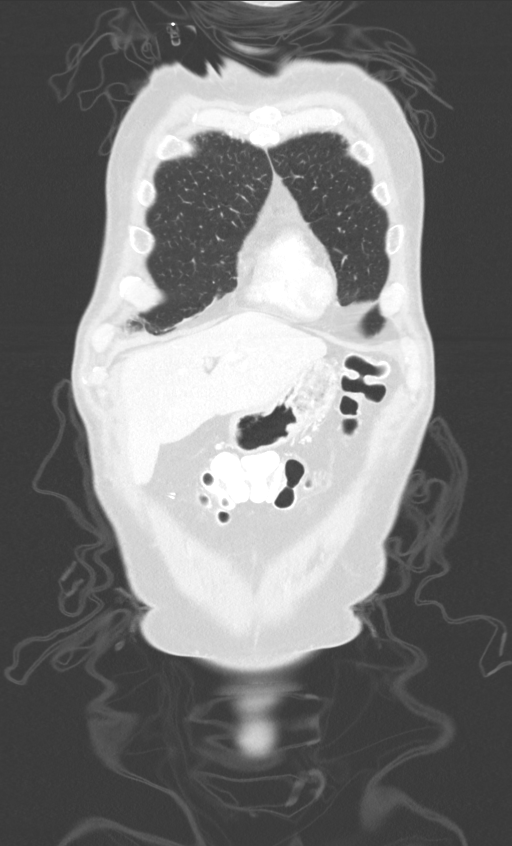
[im 52/129  lung]
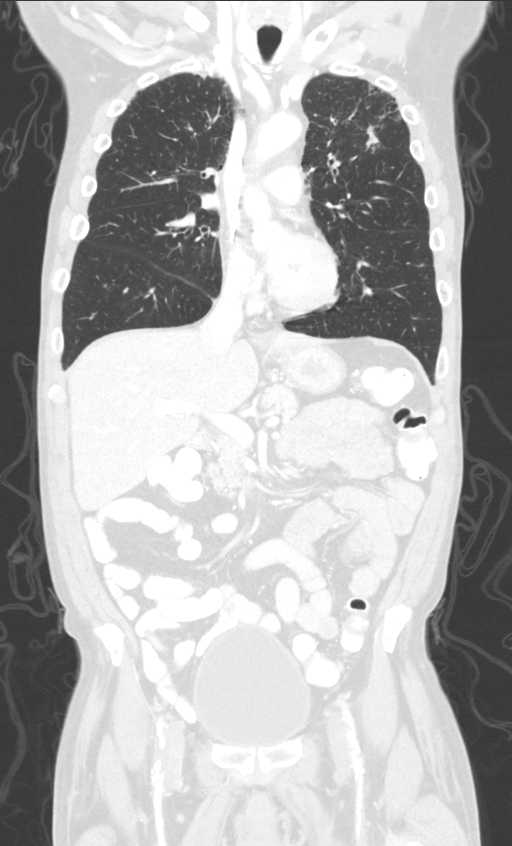
[im 77/129  lung]
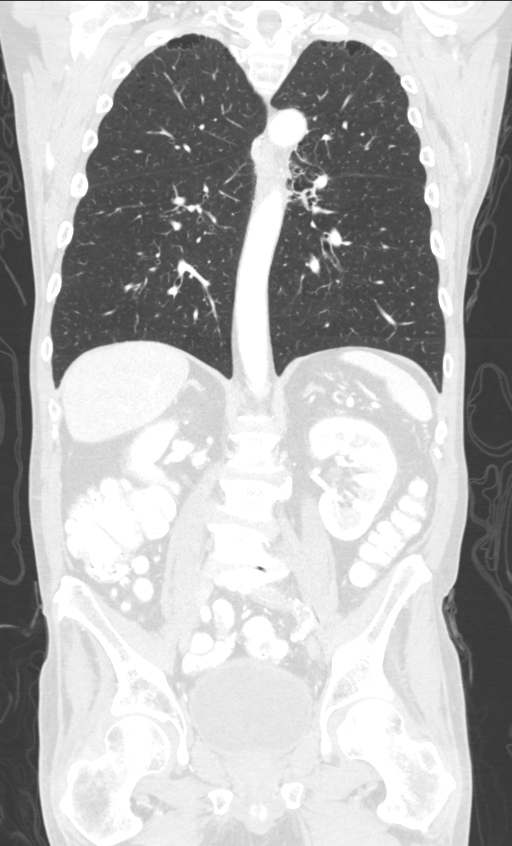

[12 of 37 positions shown; findings below may reference images not displayed]

Imaging Indication: Assess response to therapy

Interval therapy since last imaging? Yes

Initial Cancer Diagnosis

Date: 02/01/2019; established by: Biopsy-proven

Detailed Pathology: Stage IIIA metastatic non-small cell lung
cancer, adenocarcinoma. Squamous cell carcinoma of the right
neck/parotid gland.

Primary Tumor location: Left upper lobe.

Surgeries: Right parotidectomy for cancer of parotid gland
03/16/2019.

Chemotherapy: Yes; Ongoing? Yes; Most recent administration:
01/26/2020

Immunotherapy?  Yes; Type: Keytruda; Ongoing? Yes

Radiation therapy?  Yes; Date Range: 05/10/2019 - 06/23/2019;

Target: Left lung and right parotid.

EXAM:
CT CHEST, ABDOMEN, AND PELVIS WITH CONTRAST
FINDINGS: CT CHEST FINDINGS

Cardiovascular: Normal heart size. No significant pericardial
effusion/thickening. Right internal jugular Port-A-Cath terminates
at the cavoatrial junction. Atherosclerotic nonaneurysmal thoracic
aorta. Normal caliber pulmonary arteries. No central pulmonary
emboli.

Mediastinum/Nodes: No discrete thyroid nodules. Unremarkable
esophagus. No pathologically enlarged axillary, mediastinal or hilar
lymph nodes.

Lungs/Pleura: No pneumothorax. No pleural effusion. Mild
centrilobular and paraseptal emphysema with mild diffuse bronchial
wall thickening. Stable tiny calcified anterior right middle lobe 2
mm granuloma. Peripheral right lower 4 mm solid pulmonary nodule
(series 5/image 124), new. Apical left upper lobe 3 mm solid
pulmonary nodule (series 5/image 26), new. Irregular anterior left
lower lobe 2.2 x 1.7 cm pulmonary nodule (series 5/image 53),
decreased from 2.8 x 2.1 cm on 01/23/2019 CT using similar
measurement technique. Stable 7 mm left perifissural nodule (series
5/image 93).

Musculoskeletal: No aggressive appearing focal osseous lesions.
Moderate thoracic spondylosis.

CT ABDOMEN PELVIS FINDINGS

Hepatobiliary: Normal liver with no liver mass. Cholecystectomy. No
biliary ductal dilatation.

Pancreas: Normal, with no mass or duct dilation.

Spleen: Normal size. No mass.

Adrenals/Urinary Tract: Normal adrenals. Normal kidneys with no
hydronephrosis and no renal mass. Layering 3 mm stone in right
bladder, new. Chronic mild diffuse bladder wall thickening is
unchanged.

Stomach/Bowel: Normal non-distended stomach. Normal caliber small
bowel with no small bowel wall thickening. Oral contrast transits
the rectum. Normal appendix. Normal large bowel with no
diverticulosis, large bowel wall thickening or pericolonic fat
stranding.

Vascular/Lymphatic: Atherosclerotic nonaneurysmal abdominal aorta.
Patent portal, splenic, hepatic and renal veins. No pathologically
enlarged lymph nodes in the abdomen or pelvis.

Reproductive: Mildly enlarged prostate with nonspecific internal
prostatic calcifications.

Other: No pneumoperitoneum, ascites or focal fluid collection.
Ventral upper abdominal hernia repair mesh without recurrent hernia.

Musculoskeletal: No aggressive appearing focal osseous lesions.
Marked lower thoracic spondylosis.
IMPRESSION: 1. Primary pulmonary neoplasm in the anterior left upper lobe is
decreased.
2. Two new tiny pulmonary nodules measuring 4 mm in the right lower
lobe and 3 mm in the apical left upper lobe, indeterminate for
pulmonary metastases. Follow-up chest CT in 3 months recommended.
These nodules are below PET resolution.
3. No additional potential sites of metastatic disease in the chest,
abdomen and pelvis.
4. New 3 mm layering bladder stone.  No hydronephrosis.
5. Aortic Atherosclerosis (I8WVP-WX5.5) and Emphysema (I8WVP-FFK.1).

## 2021-05-20 ENCOUNTER — Other Ambulatory Visit: Payer: Self-pay | Admitting: Physician Assistant

## 2021-05-20 DIAGNOSIS — C3492 Malignant neoplasm of unspecified part of left bronchus or lung: Secondary | ICD-10-CM

## 2021-05-20 DIAGNOSIS — E039 Hypothyroidism, unspecified: Secondary | ICD-10-CM

## 2021-05-20 MED ORDER — LEVOTHYROXINE SODIUM 75 MCG PO TABS: 75.0000 ug | ORAL_TABLET | Freq: Every day | ORAL | 2 refills | Status: DC

## 2021-05-20 MED ORDER — LEVOCETIRIZINE DIHYDROCHLORIDE 5 MG PO TABS: 5.0000 mg | ORAL_TABLET | Freq: Every evening | ORAL | 2 refills | Status: DC

## 2021-05-22 ENCOUNTER — Other Ambulatory Visit: Payer: Self-pay

## 2021-05-22 ENCOUNTER — Inpatient Hospital Stay: Payer: Medicare Other

## 2021-05-22 ENCOUNTER — Inpatient Hospital Stay: Payer: Medicare Other | Attending: Internal Medicine | Admitting: Internal Medicine

## 2021-05-22 VITALS — BP 112/80 | HR 72 | Temp 96.4°F | Resp 18 | Ht 68.0 in | Wt 153.0 lb

## 2021-05-22 DIAGNOSIS — C3412 Malignant neoplasm of upper lobe, left bronchus or lung: Secondary | ICD-10-CM | POA: Diagnosis not present

## 2021-05-22 DIAGNOSIS — Z79899 Other long term (current) drug therapy: Secondary | ICD-10-CM | POA: Insufficient documentation

## 2021-05-22 DIAGNOSIS — C3492 Malignant neoplasm of unspecified part of left bronchus or lung: Secondary | ICD-10-CM

## 2021-05-22 DIAGNOSIS — Z5112 Encounter for antineoplastic immunotherapy: Secondary | ICD-10-CM | POA: Diagnosis not present

## 2021-05-22 DIAGNOSIS — Z95828 Presence of other vascular implants and grafts: Secondary | ICD-10-CM

## 2021-05-22 LAB — CBC WITH DIFFERENTIAL (CANCER CENTER ONLY)
Abs Immature Granulocytes: 0.02 10*3/uL (ref 0.00–0.07)
Basophils Absolute: 0.1 10*3/uL (ref 0.0–0.1)
Basophils Relative: 1 %
Eosinophils Absolute: 0.4 10*3/uL (ref 0.0–0.5)
Eosinophils Relative: 6 %
HCT: 37 % — ABNORMAL LOW (ref 39.0–52.0)
Hemoglobin: 12.5 g/dL — ABNORMAL LOW (ref 13.0–17.0)
Immature Granulocytes: 0 %
Lymphocytes Relative: 19 %
Lymphs Abs: 1.4 10*3/uL (ref 0.7–4.0)
MCH: 33 pg (ref 26.0–34.0)
MCHC: 33.8 g/dL (ref 30.0–36.0)
MCV: 97.6 fL (ref 80.0–100.0)
Monocytes Absolute: 0.7 10*3/uL (ref 0.1–1.0)
Monocytes Relative: 10 %
Neutro Abs: 4.6 10*3/uL (ref 1.7–7.7)
Neutrophils Relative %: 64 %
Platelet Count: 185 10*3/uL (ref 150–400)
RBC: 3.79 MIL/uL — ABNORMAL LOW (ref 4.22–5.81)
RDW: 14.1 % (ref 11.5–15.5)
WBC Count: 7.1 10*3/uL (ref 4.0–10.5)
nRBC: 0 % (ref 0.0–0.2)

## 2021-05-22 LAB — CMP (CANCER CENTER ONLY)
ALT: 5 U/L (ref 0–44)
AST: 10 U/L — ABNORMAL LOW (ref 15–41)
Albumin: 3.4 g/dL — ABNORMAL LOW (ref 3.5–5.0)
Alkaline Phosphatase: 95 U/L (ref 38–126)
Anion gap: 10 (ref 5–15)
BUN: 5 mg/dL — ABNORMAL LOW (ref 8–23)
CO2: 26 mmol/L (ref 22–32)
Calcium: 8.9 mg/dL (ref 8.9–10.3)
Chloride: 107 mmol/L (ref 98–111)
Creatinine: 0.7 mg/dL (ref 0.61–1.24)
GFR, Estimated: >60 mL/min (ref >60)
Glucose, Bld: 116 mg/dL — ABNORMAL HIGH (ref 70–99)
Potassium: 4.4 mmol/L (ref 3.5–5.1)
Sodium: 143 mmol/L (ref 135–145)
Total Bilirubin: 0.3 mg/dL (ref 0.3–1.2)
Total Protein: 6.2 g/dL — ABNORMAL LOW (ref 6.5–8.1)

## 2021-05-22 LAB — TSH: TSH: 5.289 u[IU]/mL — ABNORMAL HIGH (ref 0.320–4.118)

## 2021-05-22 MED ORDER — SODIUM CHLORIDE 0.9% FLUSH
10.0000 mL | Freq: Once | INTRAVENOUS | Status: AC
  Administered 2021-05-22: 10 mL

## 2021-05-22 MED ORDER — HEPARIN SOD (PORK) LOCK FLUSH 100 UNIT/ML IV SOLN
500.0000 [IU] | Freq: Once | INTRAVENOUS | Status: AC | PRN
  Administered 2021-05-22: 500 [IU]

## 2021-05-22 MED ORDER — SODIUM CHLORIDE 0.9% FLUSH
10.0000 mL | INTRAVENOUS | Status: DC | PRN
  Administered 2021-05-22: 10 mL

## 2021-05-22 MED ORDER — SODIUM CHLORIDE 0.9 % IV SOLN
200.0000 mg | Freq: Once | INTRAVENOUS | Status: AC
  Administered 2021-05-22: 200 mg via INTRAVENOUS
  Filled 2021-05-22: qty 8

## 2021-05-22 MED ORDER — SODIUM CHLORIDE 0.9 % IV SOLN: Freq: Once | INTRAVENOUS | Status: AC
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
## 2021-05-22 NOTE — Patient Instructions (Signed)
[facility]  Discharge Instructions: ?Thank you for choosing [facility] to provide your oncology and hematology care.  ? ?If you have a lab appointment with the Cancer Center, please go directly to the Cancer Center and check in at the registration area. ?  ?Wear comfortable clothing and clothing appropriate for easy access to any Portacath or PICC line.  ? ?We strive to give you quality time with your provider. You may need to reschedule your appointment if you arrive late (15 or more minutes).  Arriving late affects you and other patients whose appointments are after yours.  Also, if you miss three or more appointments without notifying the office, you may be dismissed from the clinic at the provider?s discretion.    ?  ?For prescription refill requests, have your pharmacy contact our office and allow 72 hours for refills to be completed.   ? ?Today you received the following chemotherapy and/or immunotherapy agents: Keytruda ?  ?To help prevent nausea and vomiting after your treatment, we encourage you to take your nausea medication as directed. ? ?BELOW ARE SYMPTOMS THAT SHOULD BE REPORTED IMMEDIATELY: ?*FEVER GREATER THAN 100.4 F (38 ?C) OR HIGHER ?*CHILLS OR SWEATING ?*NAUSEA AND VOMITING THAT IS NOT CONTROLLED WITH YOUR NAUSEA MEDICATION ?*UNUSUAL SHORTNESS OF BREATH ?*UNUSUAL BRUISING OR BLEEDING ?*URINARY PROBLEMS (pain or burning when urinating, or frequent urination) ?*BOWEL PROBLEMS (unusual diarrhea, constipation, pain near the anus) ?TENDERNESS IN MOUTH AND THROAT WITH OR WITHOUT PRESENCE OF ULCERS (sore throat, sores in mouth, or a toothache) ?UNUSUAL RASH, SWELLING OR PAIN  ?UNUSUAL VAGINAL DISCHARGE OR ITCHING  ? ?Items with * indicate a potential emergency and should be followed up as soon as possible or go to the Emergency Department if any problems should occur. ? ?Please show the CHEMOTHERAPY ALERT CARD or IMMUNOTHERAPY ALERT CARD at check-in to the  Emergency Department and triage nurse. ? ?Should you have questions after your visit or need to cancel or reschedule your appointment, please contact [facility]  Dept: [phone]  and follow the prompts.  Office hours are 8:00 a.m. to 4:30 p.m. Monday - Friday. Please note that voicemails left after 4:00 p.m. may not be returned until the following business day.  We are closed weekends and major holidays. You have access to a nurse at all times for urgent questions. Please call the main number to the clinic Dept: [phone] and follow the prompts. ? ? ?For any non-urgent questions, you may also contact your provider using MyChart. We now offer e-Visits for anyone 18 and older to request care online for non-urgent symptoms. For details visit [email or web address]. ?  ?Also download the MyChart app! Go to the app store, search "MyChart", open the app, select [facility], and log in with your MyChart username and password. ? ?Due to Covid, a mask is required upon entering the hospital/clinic. If you do not have a mask, one will be given to you upon arrival. For doctor visits, patients may have 1 support person aged 18 or older with them. For treatment visits, patients cannot have anyone with them due to current Covid guidelines and our immunocompromised population.  ? ?

## 2021-06-05 ENCOUNTER — Other Ambulatory Visit: Payer: Self-pay

## 2021-06-05 DIAGNOSIS — C3490 Malignant neoplasm of unspecified part of unspecified bronchus or lung: Secondary | ICD-10-CM

## 2021-06-10 ENCOUNTER — Other Ambulatory Visit: Payer: Self-pay | Admitting: Physician Assistant

## 2021-06-10 ENCOUNTER — Other Ambulatory Visit: Payer: Self-pay | Admitting: Internal Medicine

## 2021-06-10 DIAGNOSIS — I8290 Acute embolism and thrombosis of unspecified vein: Secondary | ICD-10-CM

## 2021-06-11 ENCOUNTER — Other Ambulatory Visit: Payer: Self-pay | Admitting: Internal Medicine

## 2021-06-11 ENCOUNTER — Other Ambulatory Visit: Payer: Self-pay

## 2021-06-11 ENCOUNTER — Ambulatory Visit (HOSPITAL_COMMUNITY)
Admission: RE | Admit: 2021-06-11 | Discharge: 2021-06-11 | Disposition: A | Discharge status: Home / Self Care | Payer: Medicare Other | Source: Ambulatory Visit | Attending: Internal Medicine | Admitting: Internal Medicine

## 2021-06-11 ENCOUNTER — Inpatient Hospital Stay: Payer: Medicare Other | Attending: Internal Medicine

## 2021-06-11 DIAGNOSIS — C3490 Malignant neoplasm of unspecified part of unspecified bronchus or lung: Secondary | ICD-10-CM

## 2021-06-11 DIAGNOSIS — C3492 Malignant neoplasm of unspecified part of left bronchus or lung: Secondary | ICD-10-CM

## 2021-06-11 DIAGNOSIS — Z85818 Personal history of malignant neoplasm of other sites of lip, oral cavity, and pharynx: Secondary | ICD-10-CM | POA: Insufficient documentation

## 2021-06-11 DIAGNOSIS — F32A Depression, unspecified: Secondary | ICD-10-CM | POA: Insufficient documentation

## 2021-06-11 DIAGNOSIS — Z95828 Presence of other vascular implants and grafts: Secondary | ICD-10-CM

## 2021-06-11 DIAGNOSIS — Z7901 Long term (current) use of anticoagulants: Secondary | ICD-10-CM | POA: Insufficient documentation

## 2021-06-11 DIAGNOSIS — C3412 Malignant neoplasm of upper lobe, left bronchus or lung: Secondary | ICD-10-CM | POA: Insufficient documentation

## 2021-06-11 DIAGNOSIS — Z86718 Personal history of other venous thrombosis and embolism: Secondary | ICD-10-CM | POA: Insufficient documentation

## 2021-06-11 DIAGNOSIS — Z79899 Other long term (current) drug therapy: Secondary | ICD-10-CM | POA: Insufficient documentation

## 2021-06-11 DIAGNOSIS — Z5112 Encounter for antineoplastic immunotherapy: Secondary | ICD-10-CM | POA: Insufficient documentation

## 2021-06-11 MED ORDER — IOHEXOL 350 MG/ML SOLN
80.0000 mL | Freq: Once | INTRAVENOUS | Status: AC | PRN
  Administered 2021-06-11: 80 mL via INTRAVENOUS

## 2021-06-11 MED ORDER — HEPARIN SOD (PORK) LOCK FLUSH 100 UNIT/ML IV SOLN
500.0000 [IU] | Freq: Once | INTRAVENOUS | Status: AC
  Administered 2021-06-11: 500 [IU] via INTRAVENOUS

## 2021-06-11 MED ORDER — SODIUM CHLORIDE 0.9% FLUSH
10.0000 mL | Freq: Once | INTRAVENOUS | Status: AC
  Administered 2021-06-11: 10 mL

## 2021-06-12 ENCOUNTER — Inpatient Hospital Stay: Payer: Medicare Other

## 2021-06-12 ENCOUNTER — Inpatient Hospital Stay (HOSPITAL_BASED_OUTPATIENT_CLINIC_OR_DEPARTMENT_OTHER): Payer: Medicare Other | Admitting: Internal Medicine

## 2021-06-12 VITALS — BP 95/71 | HR 76 | Temp 96.6°F | Resp 19 | Ht 68.0 in | Wt 159.8 lb

## 2021-06-12 DIAGNOSIS — C3492 Malignant neoplasm of unspecified part of left bronchus or lung: Secondary | ICD-10-CM

## 2021-06-12 DIAGNOSIS — Z5112 Encounter for antineoplastic immunotherapy: Secondary | ICD-10-CM | POA: Diagnosis not present

## 2021-06-12 DIAGNOSIS — F32A Depression, unspecified: Secondary | ICD-10-CM | POA: Diagnosis not present

## 2021-06-12 DIAGNOSIS — Z95828 Presence of other vascular implants and grafts: Secondary | ICD-10-CM

## 2021-06-12 DIAGNOSIS — Z7901 Long term (current) use of anticoagulants: Secondary | ICD-10-CM | POA: Diagnosis not present

## 2021-06-12 DIAGNOSIS — C3412 Malignant neoplasm of upper lobe, left bronchus or lung: Secondary | ICD-10-CM | POA: Diagnosis not present

## 2021-06-12 DIAGNOSIS — Z85818 Personal history of malignant neoplasm of other sites of lip, oral cavity, and pharynx: Secondary | ICD-10-CM | POA: Diagnosis not present

## 2021-06-12 DIAGNOSIS — Z86718 Personal history of other venous thrombosis and embolism: Secondary | ICD-10-CM | POA: Diagnosis not present

## 2021-06-12 DIAGNOSIS — Z79899 Other long term (current) drug therapy: Secondary | ICD-10-CM | POA: Diagnosis not present

## 2021-06-12 LAB — CMP (CANCER CENTER ONLY)
ALT: 8 U/L (ref 0–44)
AST: 12 U/L — ABNORMAL LOW (ref 15–41)
Albumin: 3.4 g/dL — ABNORMAL LOW (ref 3.5–5.0)
Alkaline Phosphatase: 89 U/L (ref 38–126)
Anion gap: 8 (ref 5–15)
BUN: 5 mg/dL — ABNORMAL LOW (ref 8–23)
CO2: 25 mmol/L (ref 22–32)
Calcium: 8.5 mg/dL — ABNORMAL LOW (ref 8.9–10.3)
Chloride: 102 mmol/L (ref 98–111)
Creatinine: 0.72 mg/dL (ref 0.61–1.24)
GFR, Estimated: >60 mL/min (ref >60)
Glucose, Bld: 205 mg/dL — ABNORMAL HIGH (ref 70–99)
Potassium: 4 mmol/L (ref 3.5–5.1)
Sodium: 135 mmol/L (ref 135–145)
Total Bilirubin: 0.3 mg/dL (ref 0.3–1.2)
Total Protein: 6.2 g/dL — ABNORMAL LOW (ref 6.5–8.1)

## 2021-06-12 LAB — CBC WITH DIFFERENTIAL (CANCER CENTER ONLY)
Abs Immature Granulocytes: 0.03 10*3/uL (ref 0.00–0.07)
Basophils Absolute: 0.1 10*3/uL (ref 0.0–0.1)
Basophils Relative: 1 %
Eosinophils Absolute: 0.4 10*3/uL (ref 0.0–0.5)
Eosinophils Relative: 5 %
HCT: 35.1 % — ABNORMAL LOW (ref 39.0–52.0)
Hemoglobin: 11.7 g/dL — ABNORMAL LOW (ref 13.0–17.0)
Immature Granulocytes: 0 %
Lymphocytes Relative: 17 %
Lymphs Abs: 1.2 10*3/uL (ref 0.7–4.0)
MCH: 32.4 pg (ref 26.0–34.0)
MCHC: 33.3 g/dL (ref 30.0–36.0)
MCV: 97.2 fL (ref 80.0–100.0)
Monocytes Absolute: 0.6 10*3/uL (ref 0.1–1.0)
Monocytes Relative: 9 %
Neutro Abs: 4.6 10*3/uL (ref 1.7–7.7)
Neutrophils Relative %: 68 %
Platelet Count: 192 10*3/uL (ref 150–400)
RBC: 3.61 MIL/uL — ABNORMAL LOW (ref 4.22–5.81)
RDW: 14.2 % (ref 11.5–15.5)
WBC Count: 6.8 10*3/uL (ref 4.0–10.5)
nRBC: 0 % (ref 0.0–0.2)

## 2021-06-12 LAB — TSH: TSH: 4.65 u[IU]/mL — ABNORMAL HIGH (ref 0.320–4.118)

## 2021-06-12 MED ORDER — SODIUM CHLORIDE 0.9% FLUSH
10.0000 mL | INTRAVENOUS | Status: DC | PRN
  Administered 2021-06-12: 10 mL

## 2021-06-12 MED ORDER — SODIUM CHLORIDE 0.9% FLUSH: 10.0000 mL | Freq: Once | INTRAVENOUS | Status: DC

## 2021-06-12 MED ORDER — SODIUM CHLORIDE 0.9 % IV SOLN
200.0000 mg | Freq: Once | INTRAVENOUS | Status: AC
  Administered 2021-06-12: 200 mg via INTRAVENOUS
  Filled 2021-06-12: qty 8

## 2021-06-12 MED ORDER — HEPARIN SOD (PORK) LOCK FLUSH 100 UNIT/ML IV SOLN
500.0000 [IU] | Freq: Once | INTRAVENOUS | Status: AC | PRN
  Administered 2021-06-12: 500 [IU]

## 2021-06-12 MED ORDER — SODIUM CHLORIDE 0.9 % IV SOLN: Freq: Once | INTRAVENOUS | Status: AC
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
## 2021-06-12 NOTE — Patient Instructions (Signed)
[facility]  Discharge Instructions: ?Thank you for choosing [facility] to provide your oncology and hematology care.  ? ?If you have a lab appointment with the Cancer Center, please go directly to the Cancer Center and check in at the registration area. ?  ?Wear comfortable clothing and clothing appropriate for easy access to any Portacath or PICC line.  ? ?We strive to give you quality time with your provider. You may need to reschedule your appointment if you arrive late (15 or more minutes).  Arriving late affects you and other patients whose appointments are after yours.  Also, if you miss three or more appointments without notifying the office, you may be dismissed from the clinic at the provider?s discretion.    ?  ?For prescription refill requests, have your pharmacy contact our office and allow 72 hours for refills to be completed.   ? ?Today you received the following chemotherapy and/or immunotherapy agents: Keytruda ?  ?To help prevent nausea and vomiting after your treatment, we encourage you to take your nausea medication as directed. ? ?BELOW ARE SYMPTOMS THAT SHOULD BE REPORTED IMMEDIATELY: ?*FEVER GREATER THAN 100.4 F (38 ?C) OR HIGHER ?*CHILLS OR SWEATING ?*NAUSEA AND VOMITING THAT IS NOT CONTROLLED WITH YOUR NAUSEA MEDICATION ?*UNUSUAL SHORTNESS OF BREATH ?*UNUSUAL BRUISING OR BLEEDING ?*URINARY PROBLEMS (pain or burning when urinating, or frequent urination) ?*BOWEL PROBLEMS (unusual diarrhea, constipation, pain near the anus) ?TENDERNESS IN MOUTH AND THROAT WITH OR WITHOUT PRESENCE OF ULCERS (sore throat, sores in mouth, or a toothache) ?UNUSUAL RASH, SWELLING OR PAIN  ?UNUSUAL VAGINAL DISCHARGE OR ITCHING  ? ?Items with * indicate a potential emergency and should be followed up as soon as possible or go to the Emergency Department if any problems should occur. ? ?Please show the CHEMOTHERAPY ALERT CARD or IMMUNOTHERAPY ALERT CARD at check-in to the  Emergency Department and triage nurse. ? ?Should you have questions after your visit or need to cancel or reschedule your appointment, please contact [facility]  Dept: [phone]  and follow the prompts.  Office hours are 8:00 a.m. to 4:30 p.m. Monday - Friday. Please note that voicemails left after 4:00 p.m. may not be returned until the following business day.  We are closed weekends and major holidays. You have access to a nurse at all times for urgent questions. Please call the main number to the clinic Dept: [phone] and follow the prompts. ? ? ?For any non-urgent questions, you may also contact your provider using MyChart. We now offer e-Visits for anyone 18 and older to request care online for non-urgent symptoms. For details visit [email or web address]. ?  ?Also download the MyChart app! Go to the app store, search "MyChart", open the app, select , and log in with your MyChart username and password. ? ?Due to Covid, a mask is required upon entering the hospital/clinic. If you do not have a mask, one will be given to you upon arrival. For doctor visits, patients may have 1 support person aged 18 or older with them. For treatment visits, patients cannot have anyone with them due to current Covid guidelines and our immunocompromised population.  ? ?

## 2021-06-18 ENCOUNTER — Ambulatory Visit (INDEPENDENT_AMBULATORY_CARE_PROVIDER_SITE_OTHER): Payer: Medicare Other | Admitting: Orthopedic Surgery

## 2021-06-18 ENCOUNTER — Other Ambulatory Visit: Payer: Self-pay

## 2021-06-18 DIAGNOSIS — S88111A Complete traumatic amputation at level between knee and ankle, right lower leg, initial encounter: Secondary | ICD-10-CM

## 2021-06-18 DIAGNOSIS — Z89511 Acquired absence of right leg below knee: Secondary | ICD-10-CM | POA: Diagnosis not present

## 2021-07-03 ENCOUNTER — Other Ambulatory Visit: Payer: Medicare Other

## 2021-07-03 ENCOUNTER — Ambulatory Visit: Payer: Medicare Other | Admitting: Internal Medicine

## 2021-07-03 ENCOUNTER — Ambulatory Visit: Payer: Medicare Other

## 2021-07-04 ENCOUNTER — Inpatient Hospital Stay: Payer: Medicare Other

## 2021-07-04 ENCOUNTER — Other Ambulatory Visit: Payer: Self-pay

## 2021-07-04 ENCOUNTER — Inpatient Hospital Stay: Payer: Medicare Other | Attending: Internal Medicine

## 2021-07-04 ENCOUNTER — Encounter: Payer: Self-pay | Admitting: Internal Medicine

## 2021-07-04 ENCOUNTER — Inpatient Hospital Stay (HOSPITAL_BASED_OUTPATIENT_CLINIC_OR_DEPARTMENT_OTHER): Payer: Medicare Other | Admitting: Internal Medicine

## 2021-07-04 VITALS — BP 120/82 | HR 82 | Temp 97.1°F | Resp 19 | Ht 68.0 in | Wt 161.0 lb

## 2021-07-04 DIAGNOSIS — C3412 Malignant neoplasm of upper lobe, left bronchus or lung: Secondary | ICD-10-CM | POA: Diagnosis not present

## 2021-07-04 DIAGNOSIS — Z5112 Encounter for antineoplastic immunotherapy: Secondary | ICD-10-CM | POA: Insufficient documentation

## 2021-07-04 DIAGNOSIS — Z79899 Other long term (current) drug therapy: Secondary | ICD-10-CM | POA: Diagnosis not present

## 2021-07-04 DIAGNOSIS — C3492 Malignant neoplasm of unspecified part of left bronchus or lung: Secondary | ICD-10-CM

## 2021-07-04 DIAGNOSIS — Z5189 Encounter for other specified aftercare: Secondary | ICD-10-CM | POA: Insufficient documentation

## 2021-07-04 DIAGNOSIS — Z5111 Encounter for antineoplastic chemotherapy: Secondary | ICD-10-CM

## 2021-07-04 DIAGNOSIS — C07 Malignant neoplasm of parotid gland: Secondary | ICD-10-CM | POA: Diagnosis not present

## 2021-07-04 DIAGNOSIS — Z95828 Presence of other vascular implants and grafts: Secondary | ICD-10-CM

## 2021-07-04 LAB — CBC WITH DIFFERENTIAL (CANCER CENTER ONLY)
Abs Immature Granulocytes: 0.02 10*3/uL (ref 0.00–0.07)
Basophils Absolute: 0.1 10*3/uL (ref 0.0–0.1)
Basophils Relative: 1 %
Eosinophils Absolute: 0.4 10*3/uL (ref 0.0–0.5)
Eosinophils Relative: 6 %
HCT: 38 % — ABNORMAL LOW (ref 39.0–52.0)
Hemoglobin: 12.6 g/dL — ABNORMAL LOW (ref 13.0–17.0)
Immature Granulocytes: 0 %
Lymphocytes Relative: 17 %
Lymphs Abs: 1.2 10*3/uL (ref 0.7–4.0)
MCH: 32.3 pg (ref 26.0–34.0)
MCHC: 33.2 g/dL (ref 30.0–36.0)
MCV: 97.4 fL (ref 80.0–100.0)
Monocytes Absolute: 0.8 10*3/uL (ref 0.1–1.0)
Monocytes Relative: 11 %
Neutro Abs: 4.4 10*3/uL (ref 1.7–7.7)
Neutrophils Relative %: 65 %
Platelet Count: 180 10*3/uL (ref 150–400)
RBC: 3.9 MIL/uL — ABNORMAL LOW (ref 4.22–5.81)
RDW: 14 % (ref 11.5–15.5)
WBC Count: 6.9 10*3/uL (ref 4.0–10.5)
nRBC: 0 % (ref 0.0–0.2)

## 2021-07-04 LAB — CMP (CANCER CENTER ONLY)
ALT: 6 U/L (ref 0–44)
AST: 10 U/L — ABNORMAL LOW (ref 15–41)
Albumin: 3.6 g/dL (ref 3.5–5.0)
Alkaline Phosphatase: 103 U/L (ref 38–126)
Anion gap: 9 (ref 5–15)
BUN: 7 mg/dL — ABNORMAL LOW (ref 8–23)
CO2: 25 mmol/L (ref 22–32)
Calcium: 8.8 mg/dL — ABNORMAL LOW (ref 8.9–10.3)
Chloride: 105 mmol/L (ref 98–111)
Creatinine: 0.78 mg/dL (ref 0.61–1.24)
GFR, Estimated: >60 mL/min (ref >60)
Glucose, Bld: 183 mg/dL — ABNORMAL HIGH (ref 70–99)
Potassium: 4.1 mmol/L (ref 3.5–5.1)
Sodium: 139 mmol/L (ref 135–145)
Total Bilirubin: 0.3 mg/dL (ref 0.3–1.2)
Total Protein: 6.5 g/dL (ref 6.5–8.1)

## 2021-07-04 LAB — TSH: TSH: 5.103 u[IU]/mL — ABNORMAL HIGH (ref 0.320–4.118)

## 2021-07-04 MED ORDER — SODIUM CHLORIDE 0.9% FLUSH
10.0000 mL | Freq: Once | INTRAVENOUS | Status: AC
  Administered 2021-07-04: 10 mL

## 2021-07-04 MED ORDER — SODIUM CHLORIDE 0.9 % IV SOLN
200.0000 mg | Freq: Once | INTRAVENOUS | Status: AC
  Administered 2021-07-04: 200 mg via INTRAVENOUS
  Filled 2021-07-04: qty 8

## 2021-07-04 MED ORDER — SODIUM CHLORIDE 0.9 % IV SOLN: Freq: Once | INTRAVENOUS | Status: AC

## 2021-07-04 NOTE — Patient Instructions (Signed)
Steps to Quit Smoking Smoking tobacco is the leading cause of preventable death. It can affect almost every organ in the body. Smoking puts you and people around you at risk for many serious, long-lasting (chronic) diseases. Quitting smoking can be hard, but it is one of the best things that you can do for your health. It is never too late to quit. How do I get ready to quit? When you decide to quit smoking, make a plan to help you succeed. Before you quit: Pick a date to quit. Set a date within the next 2 weeks to give you time to prepare. Write down the reasons why you are quitting. Keep this list in places where you will see it often. Tell your family, friends, and co-workers that you are quitting. Their support is important. Talk with your doctor about the choices that may help you quit. Find out if your health insurance will pay for these treatments. Know the people, places, things, and activities that make you want to smoke (triggers). Avoid them. What first steps can I take to quit smoking? Throw away all cigarettes at home, at work, and in your car. Throw away the things that you use when you smoke, such as ashtrays and lighters. Clean your car. Make sure to empty the ashtray. Clean your home, including curtains and carpets. What can I do to help me quit smoking? Talk with your doctor about taking medicines and seeing a counselor at the same time. You are more likely to succeed when you do both. If you are pregnant or breastfeeding, talk with your doctor about counseling or other ways to quit smoking. Do not take medicine to help you quit smoking unless your doctor tells you to do so. To quit smoking: Quit right away Quit smoking totally, instead of slowly cutting back on how much you smoke over a period of time. Go to counseling. You are more likely to quit if you go to counseling sessions regularly. Take medicine You may take medicines to help you quit. Some medicines need a  prescription, and some you can buy over-the-counter. Some medicines may contain a drug called nicotine to replace the nicotine in cigarettes. Medicines may: Help you to stop having the desire to smoke (cravings). Help to stop the problems that come when you stop smoking (withdrawal symptoms). Your doctor may ask you to use: Nicotine patches, gum, or lozenges. Nicotine inhalers or sprays. Non-nicotine medicine that is taken by mouth. Find resources Find resources and other ways to help you quit smoking and remain smoke-free after you quit. These resources are most helpful when you use them often. They include: Online chats with a counselor. Phone quitlines. Printed self-help materials. Support groups or group counseling. Text messaging programs. Mobile phone apps. Use apps on your mobile phone or tablet that can help you stick to your quit plan. There are many free apps for mobile phones and tablets as well as websites. Examples include Quit Guide from the CDC and smokefree.gov  What things can I do to make it easier to quit?  Talk to your family and friends. Ask them to support and encourage you. Call a phone quitline (1-800-QUIT-NOW), reach out to support groups, or work with a counselor. Ask people who smoke to not smoke around you. Avoid places that make you want to smoke, such as: Bars. Parties. Smoke-break areas at work. Spend time with people who do not smoke. Lower the stress in your life. Stress can make you want to   smoke. Try these things to help your stress: Getting regular exercise. Doing deep-breathing exercises. Doing yoga. Meditating. Doing a body scan. To do this, close your eyes, focus on one area of your body at a time from head to toe. Notice which parts of your body are tense. Try to relax the muscles in those areas. How will I feel when I quit smoking? Day 1 to 3 weeks Within the first 24 hours, you may start to have some problems that come from quitting tobacco.  These problems are very bad 2-3 days after you quit, but they do not often last for more than 2-3 weeks. You may get these symptoms: Mood swings. Feeling restless, nervous, angry, or annoyed. Trouble concentrating. Dizziness. Strong desire for high-sugar foods and nicotine. Weight gain. Trouble pooping (constipation). Feeling like you may vomit (nausea). Coughing or a sore throat. Changes in how the medicines that you take for other issues work in your body. Depression. Trouble sleeping (insomnia). Week 3 and afterward After the first 2-3 weeks of quitting, you may start to notice more positive results, such as: Better sense of smell and taste. Less coughing and sore throat. Slower heart rate. Lower blood pressure. Clearer skin. Better breathing. Fewer sick days. Quitting smoking can be hard. Do not give up if you fail the first time. Some people need to try a few times before they succeed. Do your best to stick to your quit plan, and talk with your doctor if you have any questions or concerns. Summary Smoking tobacco is the leading cause of preventable death. Quitting smoking can be hard, but it is one of the best things that you can do for your health. When you decide to quit smoking, make a plan to help you succeed. Quit smoking right away, not slowly over a period of time. When you start quitting, seek help from your doctor, family, or friends. This information is not intended to replace advice given to you by your health care provider. Make sure you discuss any questions you have with your health care provider. Document Revised: 03/29/2021 Document Reviewed: 10/09/2018 [employer] Patient Education  2022 [employer]  
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
## 2021-07-04 NOTE — Patient Instructions (Signed)
[facility]  Discharge Instructions: ?Thank you for choosing [facility] to provide your oncology and hematology care.  ? ?If you have a lab appointment with the Cancer Center, please go directly to the Cancer Center and check in at the registration area. ?  ?Wear comfortable clothing and clothing appropriate for easy access to any Portacath or PICC line.  ? ?We strive to give you quality time with your provider. You may need to reschedule your appointment if you arrive late (15 or more minutes).  Arriving late affects you and other patients whose appointments are after yours.  Also, if you miss three or more appointments without notifying the office, you may be dismissed from the clinic at the provider?s discretion.    ?  ?For prescription refill requests, have your pharmacy contact our office and allow 72 hours for refills to be completed.   ? ?Today you received the following chemotherapy and/or immunotherapy agents: Keytruda ?  ?To help prevent nausea and vomiting after your treatment, we encourage you to take your nausea medication as directed. ? ?BELOW ARE SYMPTOMS THAT SHOULD BE REPORTED IMMEDIATELY: ?*FEVER GREATER THAN 100.4 F (38 ?C) OR HIGHER ?*CHILLS OR SWEATING ?*NAUSEA AND VOMITING THAT IS NOT CONTROLLED WITH YOUR NAUSEA MEDICATION ?*UNUSUAL SHORTNESS OF BREATH ?*UNUSUAL BRUISING OR BLEEDING ?*URINARY PROBLEMS (pain or burning when urinating, or frequent urination) ?*BOWEL PROBLEMS (unusual diarrhea, constipation, pain near the anus) ?TENDERNESS IN MOUTH AND THROAT WITH OR WITHOUT PRESENCE OF ULCERS (sore throat, sores in mouth, or a toothache) ?UNUSUAL RASH, SWELLING OR PAIN  ?UNUSUAL VAGINAL DISCHARGE OR ITCHING  ? ?Items with * indicate a potential emergency and should be followed up as soon as possible or go to the Emergency Department if any problems should occur. ? ?Please show the CHEMOTHERAPY ALERT CARD or IMMUNOTHERAPY ALERT CARD at check-in to the  Emergency Department and triage nurse. ? ?Should you have questions after your visit or need to cancel or reschedule your appointment, please contact [facility]  Dept: [phone]  and follow the prompts.  Office hours are 8:00 a.m. to 4:30 p.m. Monday - Friday. Please note that voicemails left after 4:00 p.m. may not be returned until the following business day.  We are closed weekends and major holidays. You have access to a nurse at all times for urgent questions. Please call the main number to the clinic Dept: [phone] and follow the prompts. ? ? ?For any non-urgent questions, you may also contact your provider using MyChart. We now offer e-Visits for anyone 18 and older to request care online for non-urgent symptoms. For details visit [email or web address]. ?  ?Also download the MyChart app! Go to the app store, search "MyChart", open the app, select [facility], and log in with your MyChart username and password. ? ?Due to Covid, a mask is required upon entering the hospital/clinic. If you do not have a mask, one will be given to you upon arrival. For doctor visits, patients may have 1 support person aged 18 or older with them. For treatment visits, patients cannot have anyone with them due to current Covid guidelines and our immunocompromised population.  ? ?

## 2021-07-05 ENCOUNTER — Encounter: Payer: Self-pay | Admitting: Orthopedic Surgery
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
## 2021-07-11 ENCOUNTER — Other Ambulatory Visit: Payer: Self-pay | Admitting: Physician Assistant

## 2021-07-11 DIAGNOSIS — I8290 Acute embolism and thrombosis of unspecified vein: Secondary | ICD-10-CM

## 2021-07-12 ENCOUNTER — Encounter: Payer: Self-pay | Admitting: Internal Medicine

## 2021-07-20 ENCOUNTER — Other Ambulatory Visit: Payer: Self-pay

## 2021-07-23 ENCOUNTER — Inpatient Hospital Stay (HOSPITAL_BASED_OUTPATIENT_CLINIC_OR_DEPARTMENT_OTHER): Payer: Medicare Other | Admitting: Internal Medicine

## 2021-07-23 ENCOUNTER — Other Ambulatory Visit: Payer: Self-pay

## 2021-07-23 ENCOUNTER — Inpatient Hospital Stay: Payer: Medicare Other

## 2021-07-23 ENCOUNTER — Other Ambulatory Visit: Payer: Self-pay | Admitting: Internal Medicine

## 2021-07-23 VITALS — BP 142/88 | HR 88 | Temp 96.2°F | Resp 19 | Ht 68.0 in | Wt 156.7 lb

## 2021-07-23 DIAGNOSIS — C3492 Malignant neoplasm of unspecified part of left bronchus or lung: Secondary | ICD-10-CM

## 2021-07-23 DIAGNOSIS — Z5112 Encounter for antineoplastic immunotherapy: Secondary | ICD-10-CM

## 2021-07-23 DIAGNOSIS — Z5111 Encounter for antineoplastic chemotherapy: Secondary | ICD-10-CM

## 2021-07-23 DIAGNOSIS — C3412 Malignant neoplasm of upper lobe, left bronchus or lung: Secondary | ICD-10-CM

## 2021-07-23 DIAGNOSIS — Z95828 Presence of other vascular implants and grafts: Secondary | ICD-10-CM

## 2021-07-23 LAB — CMP (CANCER CENTER ONLY)
ALT: 7 U/L (ref 0–44)
AST: 14 U/L — ABNORMAL LOW (ref 15–41)
Albumin: 4.1 g/dL (ref 3.5–5.0)
Alkaline Phosphatase: 105 U/L (ref 38–126)
Anion gap: 8 (ref 5–15)
BUN: 9 mg/dL (ref 8–23)
CO2: 24 mmol/L (ref 22–32)
Calcium: 9 mg/dL (ref 8.9–10.3)
Chloride: 106 mmol/L (ref 98–111)
Creatinine: 0.78 mg/dL (ref 0.61–1.24)
GFR, Estimated: >60 mL/min (ref >60)
Glucose, Bld: 112 mg/dL — ABNORMAL HIGH (ref 70–99)
Potassium: 4.1 mmol/L (ref 3.5–5.1)
Sodium: 138 mmol/L (ref 135–145)
Total Bilirubin: 0.6 mg/dL (ref 0.3–1.2)
Total Protein: 7 g/dL (ref 6.5–8.1)

## 2021-07-23 LAB — CBC WITH DIFFERENTIAL (CANCER CENTER ONLY)
Abs Immature Granulocytes: 0.02 10*3/uL (ref 0.00–0.07)
Basophils Absolute: 0.1 10*3/uL (ref 0.0–0.1)
Basophils Relative: 1 %
Eosinophils Absolute: 0.3 10*3/uL (ref 0.0–0.5)
Eosinophils Relative: 3 %
HCT: 40.8 % (ref 39.0–52.0)
Hemoglobin: 13.9 g/dL (ref 13.0–17.0)
Immature Granulocytes: 0 %
Lymphocytes Relative: 15 %
Lymphs Abs: 1.3 10*3/uL (ref 0.7–4.0)
MCH: 32.6 pg (ref 26.0–34.0)
MCHC: 34.1 g/dL (ref 30.0–36.0)
MCV: 95.6 fL (ref 80.0–100.0)
Monocytes Absolute: 0.7 10*3/uL (ref 0.1–1.0)
Monocytes Relative: 8 %
Neutro Abs: 6.2 10*3/uL (ref 1.7–7.7)
Neutrophils Relative %: 73 %
Platelet Count: 181 10*3/uL (ref 150–400)
RBC: 4.27 MIL/uL (ref 4.22–5.81)
RDW: 13.6 % (ref 11.5–15.5)
WBC Count: 8.5 10*3/uL (ref 4.0–10.5)
nRBC: 0 % (ref 0.0–0.2)

## 2021-07-23 LAB — TSH: TSH: 5.897 u[IU]/mL — ABNORMAL HIGH (ref 0.320–4.118)

## 2021-07-23 MED ORDER — SODIUM CHLORIDE 0.9 % IV SOLN
200.0000 mg | Freq: Once | INTRAVENOUS | Status: AC
  Administered 2021-07-23: 14:00:00 200 mg via INTRAVENOUS
  Filled 2021-07-23: qty 8

## 2021-07-23 MED ORDER — SODIUM CHLORIDE 0.9% FLUSH
10.0000 mL | INTRAVENOUS | Status: DC | PRN
  Administered 2021-07-23: 15:00:00 10 mL

## 2021-07-23 MED ORDER — HEPARIN SOD (PORK) LOCK FLUSH 100 UNIT/ML IV SOLN
500.0000 [IU] | Freq: Once | INTRAVENOUS | Status: DC
  Administered 2021-07-23: 15:00:00 500 [IU]

## 2021-07-23 MED ORDER — SODIUM CHLORIDE 0.9 % IV SOLN: Freq: Once | INTRAVENOUS | Status: AC

## 2021-07-23 MED ORDER — ALTEPLASE 2 MG IJ SOLR
2.0000 mg | Freq: Once | INTRAMUSCULAR | Status: AC
  Administered 2021-07-23: 12:00:00 2 mg
  Filled 2021-07-23: qty 2

## 2021-07-23 MED ORDER — SODIUM CHLORIDE 0.9% FLUSH
10.0000 mL | Freq: Once | INTRAVENOUS | Status: AC
  Administered 2021-07-23: 11:00:00 10 mL

## 2021-07-23 NOTE — Progress Notes (Signed)
Patient was given cathflo by Kasandra Knudsen RN.

## 2021-07-23 NOTE — Patient Instructions (Signed)
[facility]  Discharge Instructions: Thank you for choosing [facility] to provide your oncology and hematology care.   If you have a lab appointment with the [facility], please go directly to the [facility] and check in at the registration area.   Wear comfortable clothing and clothing appropriate for easy access to any Portacath or PICC line.   We strive to give you quality time with your provider. You may need to reschedule your appointment if you arrive late (15 or more minutes).  Arriving late affects you and other patients whose appointments are after yours.  Also, if you miss three or more appointments without notifying the office, you may be dismissed from the clinic at the providers discretion.      For prescription refill requests, have your pharmacy contact our office and allow 72 hours for refills to be completed.    Today you received the following chemotherapy and/or immunotherapy agents Beryle Flock      To help prevent nausea and vomiting after your treatment, we encourage you to take your nausea medication as directed.  BELOW ARE SYMPTOMS THAT SHOULD BE REPORTED IMMEDIATELY: *FEVER GREATER THAN 100.4 F (38 C) OR HIGHER *CHILLS OR SWEATING *NAUSEA AND VOMITING THAT IS NOT CONTROLLED WITH YOUR NAUSEA MEDICATION *UNUSUAL SHORTNESS OF BREATH *UNUSUAL BRUISING OR BLEEDING *URINARY PROBLEMS (pain or burning when urinating, or frequent urination) *BOWEL PROBLEMS (unusual diarrhea, constipation, pain near the anus) TENDERNESS IN MOUTH AND THROAT WITH OR WITHOUT PRESENCE OF ULCERS (sore throat, sores in mouth, or a toothache) UNUSUAL RASH, SWELLING OR PAIN  UNUSUAL VAGINAL DISCHARGE OR ITCHING   Items with * indicate a potential emergency and should be followed up as soon as possible or go to the Emergency Department if any problems should occur.  Please show the CHEMOTHERAPY ALERT CARD or IMMUNOTHERAPY ALERT CARD at check-in to  the Emergency Department and triage nurse.  Should you have questions after your visit or need to cancel or reschedule your appointment, please contact [facility]  Dept: [phone]  and follow the prompts.  Office hours are 8:00 a.m. to 4:30 p.m. Monday - Friday. Please note that voicemails left after 4:00 p.m. may not be returned until the following business day.  We are closed weekends and major holidays. You have access to a nurse at all times for urgent questions. Please call the main number to the clinic Dept: [phone] and follow the prompts.   For any non-urgent questions, you may also contact your provider using MyChart. We now offer e-Visits for anyone 23 and older to request care online for non-urgent symptoms. For details visit [email or web address].   Also download the MyChart app! Go to the app store, search "MyChart", open the app, select [facility], and log in with your MyChart username and password.  Due to Covid, a mask is required upon entering the hospital/clinic. If you do not have a mask, one will be given to you upon arrival. For doctor visits, patients may have 1 support person aged 67 or older with them. For treatment visits, patients cannot have anyone with them due to current Covid guidelines and our immunocompromised population.
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
## 2021-07-24 ENCOUNTER — Other Ambulatory Visit: Payer: Self-pay | Admitting: Physician Assistant

## 2021-07-24 DIAGNOSIS — E039 Hypothyroidism, unspecified: Secondary | ICD-10-CM

## 2021-07-24 MED ORDER — LEVOTHYROXINE SODIUM 88 MCG PO TABS: 88.0000 ug | ORAL_TABLET | Freq: Every day | ORAL | 2 refills | Status: DC

## 2021-07-31 ENCOUNTER — Ambulatory Visit: Payer: Medicare Other

## 2021-07-31 ENCOUNTER — Other Ambulatory Visit: Payer: Medicare Other
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
## 2021-08-13 ENCOUNTER — Other Ambulatory Visit: Payer: Self-pay

## 2021-08-13 ENCOUNTER — Inpatient Hospital Stay (HOSPITAL_BASED_OUTPATIENT_CLINIC_OR_DEPARTMENT_OTHER): Payer: Medicare Other | Admitting: Physician Assistant

## 2021-08-13 ENCOUNTER — Telehealth: Payer: Self-pay | Admitting: Physician Assistant

## 2021-08-13 ENCOUNTER — Inpatient Hospital Stay: Payer: Medicare Other | Attending: Internal Medicine

## 2021-08-13 ENCOUNTER — Inpatient Hospital Stay: Payer: Medicare Other

## 2021-08-13 VITALS — BP 120/69

## 2021-08-13 VITALS — BP 120/69 | HR 88 | Temp 98.1°F | Resp 17 | Wt 160.3 lb

## 2021-08-13 DIAGNOSIS — I959 Hypotension, unspecified: Secondary | ICD-10-CM

## 2021-08-13 DIAGNOSIS — Z79899 Other long term (current) drug therapy: Secondary | ICD-10-CM | POA: Diagnosis not present

## 2021-08-13 DIAGNOSIS — C3492 Malignant neoplasm of unspecified part of left bronchus or lung: Secondary | ICD-10-CM | POA: Diagnosis not present

## 2021-08-13 DIAGNOSIS — Z95828 Presence of other vascular implants and grafts: Secondary | ICD-10-CM

## 2021-08-13 DIAGNOSIS — C3412 Malignant neoplasm of upper lobe, left bronchus or lung: Secondary | ICD-10-CM

## 2021-08-13 DIAGNOSIS — F17210 Nicotine dependence, cigarettes, uncomplicated: Secondary | ICD-10-CM | POA: Insufficient documentation

## 2021-08-13 DIAGNOSIS — E039 Hypothyroidism, unspecified: Secondary | ICD-10-CM | POA: Diagnosis not present

## 2021-08-13 DIAGNOSIS — Z5112 Encounter for antineoplastic immunotherapy: Secondary | ICD-10-CM | POA: Diagnosis present

## 2021-08-13 LAB — CBC WITH DIFFERENTIAL (CANCER CENTER ONLY)
Abs Immature Granulocytes: 0.07 10*3/uL (ref 0.00–0.07)
Basophils Absolute: 0.1 10*3/uL (ref 0.0–0.1)
Basophils Relative: 1 %
Eosinophils Absolute: 0.2 10*3/uL (ref 0.0–0.5)
Eosinophils Relative: 2 %
HCT: 34 % — ABNORMAL LOW (ref 39.0–52.0)
Hemoglobin: 11.5 g/dL — ABNORMAL LOW (ref 13.0–17.0)
Immature Granulocytes: 1 %
Lymphocytes Relative: 7 %
Lymphs Abs: 0.7 10*3/uL (ref 0.7–4.0)
MCH: 32.4 pg (ref 26.0–34.0)
MCHC: 33.8 g/dL (ref 30.0–36.0)
MCV: 95.8 fL (ref 80.0–100.0)
Monocytes Absolute: 0.6 10*3/uL (ref 0.1–1.0)
Monocytes Relative: 6 %
Neutro Abs: 8.3 10*3/uL — ABNORMAL HIGH (ref 1.7–7.7)
Neutrophils Relative %: 83 %
Platelet Count: 348 10*3/uL (ref 150–400)
RBC: 3.55 MIL/uL — ABNORMAL LOW (ref 4.22–5.81)
RDW: 13.3 % (ref 11.5–15.5)
WBC Count: 10 10*3/uL (ref 4.0–10.5)
nRBC: 0 % (ref 0.0–0.2)

## 2021-08-13 LAB — CMP (CANCER CENTER ONLY)
ALT: 6 U/L (ref 0–44)
AST: 9 U/L — ABNORMAL LOW (ref 15–41)
Albumin: 3.4 g/dL — ABNORMAL LOW (ref 3.5–5.0)
Alkaline Phosphatase: 75 U/L (ref 38–126)
Anion gap: 6 (ref 5–15)
BUN: 7 mg/dL — ABNORMAL LOW (ref 8–23)
CO2: 29 mmol/L (ref 22–32)
Calcium: 8.7 mg/dL — ABNORMAL LOW (ref 8.9–10.3)
Chloride: 99 mmol/L (ref 98–111)
Creatinine: 0.68 mg/dL (ref 0.61–1.24)
GFR, Estimated: >60 mL/min (ref >60)
Glucose, Bld: 210 mg/dL — ABNORMAL HIGH (ref 70–99)
Potassium: 4.5 mmol/L (ref 3.5–5.1)
Sodium: 134 mmol/L — ABNORMAL LOW (ref 135–145)
Total Bilirubin: 0.3 mg/dL (ref 0.3–1.2)
Total Protein: 6.7 g/dL (ref 6.5–8.1)

## 2021-08-13 LAB — TSH: TSH: 1.111 u[IU]/mL (ref 0.320–4.118)

## 2021-08-13 MED ORDER — SODIUM CHLORIDE 0.9 % IV SOLN: Freq: Once | INTRAVENOUS | Status: DC

## 2021-08-13 MED ORDER — SODIUM CHLORIDE 0.9 % IV SOLN
200.0000 mg | Freq: Once | INTRAVENOUS | Status: AC
  Administered 2021-08-13: 200 mg via INTRAVENOUS
  Filled 2021-08-13: qty 8

## 2021-08-13 MED ORDER — SODIUM CHLORIDE 0.9% FLUSH
10.0000 mL | Freq: Once | INTRAVENOUS | Status: AC
  Administered 2021-08-13: 10 mL

## 2021-08-13 MED ORDER — HEPARIN SOD (PORK) LOCK FLUSH 100 UNIT/ML IV SOLN
500.0000 [IU] | Freq: Once | INTRAVENOUS | Status: AC | PRN
  Administered 2021-08-13: 500 [IU]

## 2021-08-13 MED ORDER — SODIUM CHLORIDE 0.9% FLUSH
10.0000 mL | INTRAVENOUS | Status: DC | PRN
  Administered 2021-08-13: 10 mL

## 2021-08-13 MED ORDER — LEVOTHYROXINE SODIUM 75 MCG PO TABS: 75.0000 ug | ORAL_TABLET | Freq: Every day | ORAL | 2 refills | Status: DC

## 2021-08-13 MED ORDER — SODIUM CHLORIDE 0.9 % IV SOLN: Freq: Once | INTRAVENOUS | Status: AC

## 2021-08-13 NOTE — Patient Instructions (Signed)

## 2021-08-13 NOTE — Telephone Encounter (Signed)
I called the patient to let him know I refilled the 75 mcg dose of the synthroid. Left voicemail.

## 2021-08-13 NOTE — Patient Instructions (Signed)
[facility]  Discharge Instructions: ?Thank you for choosing [facility] to provide your oncology and hematology care.  ? ?If you have a lab appointment with the Cancer Center, please go directly to the Cancer Center and check in at the registration area. ?  ?Wear comfortable clothing and clothing appropriate for easy access to any Portacath or PICC line.  ? ?We strive to give you quality time with your provider. You may need to reschedule your appointment if you arrive late (15 or more minutes).  Arriving late affects you and other patients whose appointments are after yours.  Also, if you miss three or more appointments without notifying the office, you may be dismissed from the clinic at the provider?s discretion.    ?  ?For prescription refill requests, have your pharmacy contact our office and allow 72 hours for refills to be completed.   ? ?Today you received the following chemotherapy and/or immunotherapy agents: Keytruda ?  ?To help prevent nausea and vomiting after your treatment, we encourage you to take your nausea medication as directed. ? ?BELOW ARE SYMPTOMS THAT SHOULD BE REPORTED IMMEDIATELY: ?*FEVER GREATER THAN 100.4 F (38 ?C) OR HIGHER ?*CHILLS OR SWEATING ?*NAUSEA AND VOMITING THAT IS NOT CONTROLLED WITH YOUR NAUSEA MEDICATION ?*UNUSUAL SHORTNESS OF BREATH ?*UNUSUAL BRUISING OR BLEEDING ?*URINARY PROBLEMS (pain or burning when urinating, or frequent urination) ?*BOWEL PROBLEMS (unusual diarrhea, constipation, pain near the anus) ?TENDERNESS IN MOUTH AND THROAT WITH OR WITHOUT PRESENCE OF ULCERS (sore throat, sores in mouth, or a toothache) ?UNUSUAL RASH, SWELLING OR PAIN  ?UNUSUAL VAGINAL DISCHARGE OR ITCHING  ? ?Items with * indicate a potential emergency and should be followed up as soon as possible or go to the Emergency Department if any problems should occur. ? ?Please show the CHEMOTHERAPY ALERT CARD or IMMUNOTHERAPY ALERT CARD at check-in to the  Emergency Department and triage nurse. ? ?Should you have questions after your visit or need to cancel or reschedule your appointment, please contact [facility]  Dept: [phone]  and follow the prompts.  Office hours are 8:00 a.m. to 4:30 p.m. Monday - Friday. Please note that voicemails left after 4:00 p.m. may not be returned until the following business day.  We are closed weekends and major holidays. You have access to a nurse at all times for urgent questions. Please call the main number to the clinic Dept: [phone] and follow the prompts. ? ? ?For any non-urgent questions, you may also contact your provider using MyChart. We now offer e-Visits for anyone 18 and older to request care online for non-urgent symptoms. For details visit [email or web address]. ?  ?Also download the MyChart app! Go to the app store, search "MyChart", open the app, select [facility], and log in with your MyChart username and password. ? ?Due to Covid, a mask is required upon entering the hospital/clinic. If you do not have a mask, one will be given to you upon arrival. For doctor visits, patients may have 1 support person aged 18 or older with them. For treatment visits, patients cannot have anyone with them due to current Covid guidelines and our immunocompromised population.  ? ?

## 2021-08-16 ENCOUNTER — Other Ambulatory Visit: Payer: Medicare Other

## 2021-08-16 ENCOUNTER — Ambulatory Visit: Payer: Medicare Other

## 2021-08-16 ENCOUNTER — Ambulatory Visit: Payer: Medicare Other | Admitting: Physician Assistant

## 2021-08-21 ENCOUNTER — Other Ambulatory Visit: Payer: Self-pay | Admitting: Physician Assistant

## 2021-08-21 ENCOUNTER — Ambulatory Visit: Payer: Medicare Other

## 2021-08-21 ENCOUNTER — Ambulatory Visit: Payer: Medicare Other | Admitting: Physician Assistant

## 2021-08-21 ENCOUNTER — Other Ambulatory Visit: Payer: Medicare Other

## 2021-08-21 DIAGNOSIS — I8290 Acute embolism and thrombosis of unspecified vein: Secondary | ICD-10-CM

## 2021-08-22 ENCOUNTER — Other Ambulatory Visit: Payer: Self-pay | Admitting: Physician Assistant

## 2021-08-22 DIAGNOSIS — I8290 Acute embolism and thrombosis of unspecified vein: Secondary | ICD-10-CM

## 2021-08-22 MED ORDER — APIXABAN 5 MG PO TABS: 5.0000 mg | ORAL_TABLET | Freq: Two times a day (BID) | ORAL | 2 refills | Status: DC

## 2021-09-02 ENCOUNTER — Ambulatory Visit (HOSPITAL_COMMUNITY)
Admission: RE | Admit: 2021-09-02 | Discharge: 2021-09-02 | Disposition: A | Discharge status: Home / Self Care | Payer: Medicare Other | Source: Ambulatory Visit | Attending: Physician Assistant | Admitting: Physician Assistant

## 2021-09-02 ENCOUNTER — Inpatient Hospital Stay: Payer: Medicare Other

## 2021-09-02 ENCOUNTER — Other Ambulatory Visit: Payer: Self-pay

## 2021-09-02 DIAGNOSIS — Z5112 Encounter for antineoplastic immunotherapy: Secondary | ICD-10-CM | POA: Diagnosis not present

## 2021-09-02 DIAGNOSIS — C3492 Malignant neoplasm of unspecified part of left bronchus or lung: Secondary | ICD-10-CM

## 2021-09-02 DIAGNOSIS — Z95828 Presence of other vascular implants and grafts: Secondary | ICD-10-CM

## 2021-09-02 MED ORDER — HEPARIN SOD (PORK) LOCK FLUSH 100 UNIT/ML IV SOLN
500.0000 [IU] | Freq: Once | INTRAVENOUS | Status: AC
  Administered 2021-09-02: 500 [IU] via INTRAVENOUS

## 2021-09-02 MED ORDER — SODIUM CHLORIDE (PF) 0.9 % IJ SOLN
INTRAMUSCULAR | Status: AC
  Filled 2021-09-02: qty 50

## 2021-09-02 MED ORDER — HEPARIN SOD (PORK) LOCK FLUSH 100 UNIT/ML IV SOLN
INTRAVENOUS | Status: AC
  Filled 2021-09-02: qty 5

## 2021-09-02 MED ORDER — IOHEXOL 300 MG/ML  SOLN
100.0000 mL | Freq: Once | INTRAMUSCULAR | Status: AC | PRN
  Administered 2021-09-02: 100 mL via INTRAVENOUS

## 2021-09-02 MED ORDER — SODIUM CHLORIDE 0.9% FLUSH: 10.0000 mL | Freq: Once | INTRAVENOUS | Status: DC

## 2021-09-04 ENCOUNTER — Inpatient Hospital Stay: Payer: Medicare Other | Attending: Internal Medicine

## 2021-09-04 ENCOUNTER — Encounter: Payer: Self-pay | Admitting: Internal Medicine

## 2021-09-04 ENCOUNTER — Inpatient Hospital Stay: Payer: Medicare Other

## 2021-09-04 ENCOUNTER — Other Ambulatory Visit: Payer: Self-pay

## 2021-09-04 ENCOUNTER — Inpatient Hospital Stay (HOSPITAL_BASED_OUTPATIENT_CLINIC_OR_DEPARTMENT_OTHER): Payer: Medicare Other | Admitting: Internal Medicine

## 2021-09-04 VITALS — BP 102/70 | HR 79 | Temp 96.5°F | Resp 19 | Ht 68.0 in | Wt 155.1 lb

## 2021-09-04 DIAGNOSIS — Z5112 Encounter for antineoplastic immunotherapy: Secondary | ICD-10-CM | POA: Insufficient documentation

## 2021-09-04 DIAGNOSIS — C07 Malignant neoplasm of parotid gland: Secondary | ICD-10-CM | POA: Insufficient documentation

## 2021-09-04 DIAGNOSIS — Z79899 Other long term (current) drug therapy: Secondary | ICD-10-CM | POA: Diagnosis not present

## 2021-09-04 DIAGNOSIS — C3412 Malignant neoplasm of upper lobe, left bronchus or lung: Secondary | ICD-10-CM | POA: Insufficient documentation

## 2021-09-04 DIAGNOSIS — C3492 Malignant neoplasm of unspecified part of left bronchus or lung: Secondary | ICD-10-CM

## 2021-09-04 DIAGNOSIS — Z95828 Presence of other vascular implants and grafts: Secondary | ICD-10-CM

## 2021-09-04 LAB — CMP (CANCER CENTER ONLY)
ALT: 6 U/L (ref 0–44)
AST: 11 U/L — ABNORMAL LOW (ref 15–41)
Albumin: 3.4 g/dL — ABNORMAL LOW (ref 3.5–5.0)
Alkaline Phosphatase: 84 U/L (ref 38–126)
Anion gap: 5 (ref 5–15)
BUN: 5 mg/dL — ABNORMAL LOW (ref 8–23)
CO2: 29 mmol/L (ref 22–32)
Calcium: 8.6 mg/dL — ABNORMAL LOW (ref 8.9–10.3)
Chloride: 103 mmol/L (ref 98–111)
Creatinine: 0.69 mg/dL (ref 0.61–1.24)
GFR, Estimated: >60 mL/min (ref >60)
Glucose, Bld: 198 mg/dL — ABNORMAL HIGH (ref 70–99)
Potassium: 4.3 mmol/L (ref 3.5–5.1)
Sodium: 137 mmol/L (ref 135–145)
Total Bilirubin: 0.3 mg/dL (ref 0.3–1.2)
Total Protein: 6.5 g/dL (ref 6.5–8.1)

## 2021-09-04 LAB — CBC WITH DIFFERENTIAL (CANCER CENTER ONLY)
Abs Immature Granulocytes: 0.03 10*3/uL (ref 0.00–0.07)
Basophils Absolute: 0.1 10*3/uL (ref 0.0–0.1)
Basophils Relative: 1 %
Eosinophils Absolute: 0.2 10*3/uL (ref 0.0–0.5)
Eosinophils Relative: 3 %
HCT: 34.3 % — ABNORMAL LOW (ref 39.0–52.0)
Hemoglobin: 11.4 g/dL — ABNORMAL LOW (ref 13.0–17.0)
Immature Granulocytes: 0 %
Lymphocytes Relative: 16 %
Lymphs Abs: 1.1 10*3/uL (ref 0.7–4.0)
MCH: 31.7 pg (ref 26.0–34.0)
MCHC: 33.2 g/dL (ref 30.0–36.0)
MCV: 95.3 fL (ref 80.0–100.0)
Monocytes Absolute: 0.6 10*3/uL (ref 0.1–1.0)
Monocytes Relative: 9 %
Neutro Abs: 5 10*3/uL (ref 1.7–7.7)
Neutrophils Relative %: 71 %
Platelet Count: 237 10*3/uL (ref 150–400)
RBC: 3.6 MIL/uL — ABNORMAL LOW (ref 4.22–5.81)
RDW: 14.5 % (ref 11.5–15.5)
WBC Count: 7 10*3/uL (ref 4.0–10.5)
nRBC: 0 % (ref 0.0–0.2)

## 2021-09-04 LAB — TSH: TSH: 2.593 u[IU]/mL (ref 0.320–4.118)

## 2021-09-04 MED ORDER — SODIUM CHLORIDE 0.9 % IV SOLN: Freq: Once | INTRAVENOUS | Status: AC

## 2021-09-04 MED ORDER — HEPARIN SOD (PORK) LOCK FLUSH 100 UNIT/ML IV SOLN
500.0000 [IU] | Freq: Once | INTRAVENOUS | Status: AC | PRN
  Administered 2021-09-04: 500 [IU]

## 2021-09-04 MED ORDER — SODIUM CHLORIDE 0.9% FLUSH
10.0000 mL | Freq: Once | INTRAVENOUS | Status: AC
  Administered 2021-09-04: 10 mL

## 2021-09-04 MED ORDER — SODIUM CHLORIDE 0.9 % IV SOLN
200.0000 mg | Freq: Once | INTRAVENOUS | Status: AC
  Administered 2021-09-04: 200 mg via INTRAVENOUS
  Filled 2021-09-04: qty 200

## 2021-09-04 MED ORDER — LEVOFLOXACIN 500 MG PO TABS: 500.0000 mg | ORAL_TABLET | Freq: Every day | ORAL | 0 refills | Status: DC

## 2021-09-04 MED ORDER — SODIUM CHLORIDE 0.9% FLUSH
10.0000 mL | INTRAVENOUS | Status: DC | PRN
  Administered 2021-09-04: 10 mL

## 2021-09-04 NOTE — Patient Instructions (Signed)
[facility]  Discharge Instructions: ?Thank you for choosing [facility] to provide your oncology and hematology care.  ? ?If you have a lab appointment with the Cancer Center, please go directly to the Cancer Center and check in at the registration area. ?  ?Wear comfortable clothing and clothing appropriate for easy access to any Portacath or PICC line.  ? ?We strive to give you quality time with your provider. You may need to reschedule your appointment if you arrive late (15 or more minutes).  Arriving late affects you and other patients whose appointments are after yours.  Also, if you miss three or more appointments without notifying the office, you may be dismissed from the clinic at the provider?s discretion.    ?  ?For prescription refill requests, have your pharmacy contact our office and allow 72 hours for refills to be completed.   ? ?Today you received the following chemotherapy and/or immunotherapy agents: Keytruda ?  ?To help prevent nausea and vomiting after your treatment, we encourage you to take your nausea medication as directed. ? ?BELOW ARE SYMPTOMS THAT SHOULD BE REPORTED IMMEDIATELY: ?*FEVER GREATER THAN 100.4 F (38 ?C) OR HIGHER ?*CHILLS OR SWEATING ?*NAUSEA AND VOMITING THAT IS NOT CONTROLLED WITH YOUR NAUSEA MEDICATION ?*UNUSUAL SHORTNESS OF BREATH ?*UNUSUAL BRUISING OR BLEEDING ?*URINARY PROBLEMS (pain or burning when urinating, or frequent urination) ?*BOWEL PROBLEMS (unusual diarrhea, constipation, pain near the anus) ?TENDERNESS IN MOUTH AND THROAT WITH OR WITHOUT PRESENCE OF ULCERS (sore throat, sores in mouth, or a toothache) ?UNUSUAL RASH, SWELLING OR PAIN  ?UNUSUAL VAGINAL DISCHARGE OR ITCHING  ? ?Items with * indicate a potential emergency and should be followed up as soon as possible or go to the Emergency Department if any problems should occur. ? ?Please show the CHEMOTHERAPY ALERT CARD or IMMUNOTHERAPY ALERT CARD at check-in to the  Emergency Department and triage nurse. ? ?Should you have questions after your visit or need to cancel or reschedule your appointment, please contact [facility]  Dept: [phone]  and follow the prompts.  Office hours are 8:00 a.m. to 4:30 p.m. Monday - Friday. Please note that voicemails left after 4:00 p.m. may not be returned until the following business day.  We are closed weekends and major holidays. You have access to a nurse at all times for urgent questions. Please call the main number to the clinic Dept: [phone] and follow the prompts. ? ? ?For any non-urgent questions, you may also contact your provider using MyChart. We now offer e-Visits for anyone 18 and older to request care online for non-urgent symptoms. For details visit [email or web address]. ?  ?Also download the MyChart app! Go to the app store, search "MyChart", open the app, select [facility], and log in with your MyChart username and password. ? ?Due to Covid, a mask is required upon entering the hospital/clinic. If you do not have a mask, one will be given to you upon arrival. For doctor visits, patients may have 1 support person aged 18 or older with them. For treatment visits, patients cannot have anyone with them due to current Covid guidelines and our immunocompromised population.  ? ?
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
## 2021-09-06 ENCOUNTER — Telehealth: Payer: Self-pay | Admitting: Internal Medicine

## 2021-09-06 NOTE — Telephone Encounter (Signed)
Scheduled per 02/01 los, patient has been called and voicemail was left.

## 2021-09-10 ENCOUNTER — Ambulatory Visit: Payer: Medicare Other

## 2021-09-10 ENCOUNTER — Ambulatory Visit: Payer: Medicare Other | Admitting: Internal Medicine

## 2021-09-10 ENCOUNTER — Other Ambulatory Visit: Payer: Medicare Other

## 2021-09-25 ENCOUNTER — Other Ambulatory Visit: Payer: Self-pay | Admitting: *Deleted

## 2021-09-25 DIAGNOSIS — C3492 Malignant neoplasm of unspecified part of left bronchus or lung: Secondary | ICD-10-CM

## 2021-09-26 ENCOUNTER — Inpatient Hospital Stay: Payer: Medicare Other

## 2021-09-26 ENCOUNTER — Inpatient Hospital Stay (HOSPITAL_BASED_OUTPATIENT_CLINIC_OR_DEPARTMENT_OTHER): Payer: Medicare Other | Admitting: Internal Medicine

## 2021-09-26 ENCOUNTER — Other Ambulatory Visit: Payer: Self-pay

## 2021-09-26 VITALS — BP 104/76 | HR 79 | Temp 96.5°F | Resp 18 | Ht 68.0 in | Wt 155.7 lb

## 2021-09-26 DIAGNOSIS — C3492 Malignant neoplasm of unspecified part of left bronchus or lung: Secondary | ICD-10-CM

## 2021-09-26 DIAGNOSIS — C3412 Malignant neoplasm of upper lobe, left bronchus or lung: Secondary | ICD-10-CM

## 2021-09-26 DIAGNOSIS — Z5112 Encounter for antineoplastic immunotherapy: Secondary | ICD-10-CM | POA: Diagnosis not present

## 2021-09-26 DIAGNOSIS — Z95828 Presence of other vascular implants and grafts: Secondary | ICD-10-CM

## 2021-09-26 LAB — CBC WITH DIFFERENTIAL (CANCER CENTER ONLY)
Abs Immature Granulocytes: 0.01 10*3/uL (ref 0.00–0.07)
Basophils Absolute: 0.1 10*3/uL (ref 0.0–0.1)
Basophils Relative: 1 %
Eosinophils Absolute: 0.4 10*3/uL (ref 0.0–0.5)
Eosinophils Relative: 6 %
HCT: 37.9 % — ABNORMAL LOW (ref 39.0–52.0)
Hemoglobin: 12.5 g/dL — ABNORMAL LOW (ref 13.0–17.0)
Immature Granulocytes: 0 %
Lymphocytes Relative: 19 %
Lymphs Abs: 1.3 10*3/uL (ref 0.7–4.0)
MCH: 31.3 pg (ref 26.0–34.0)
MCHC: 33 g/dL (ref 30.0–36.0)
MCV: 95 fL (ref 80.0–100.0)
Monocytes Absolute: 0.6 10*3/uL (ref 0.1–1.0)
Monocytes Relative: 8 %
Neutro Abs: 4.5 10*3/uL (ref 1.7–7.7)
Neutrophils Relative %: 66 %
Platelet Count: 162 10*3/uL (ref 150–400)
RBC: 3.99 MIL/uL — ABNORMAL LOW (ref 4.22–5.81)
RDW: 15.6 % — ABNORMAL HIGH (ref 11.5–15.5)
WBC Count: 6.7 10*3/uL (ref 4.0–10.5)
nRBC: 0 % (ref 0.0–0.2)

## 2021-09-26 LAB — CMP (CANCER CENTER ONLY)
ALT: 6 U/L (ref 0–44)
AST: 12 U/L — ABNORMAL LOW (ref 15–41)
Albumin: 3.8 g/dL (ref 3.5–5.0)
Alkaline Phosphatase: 83 U/L (ref 38–126)
Anion gap: 3 — ABNORMAL LOW (ref 5–15)
BUN: 6 mg/dL — ABNORMAL LOW (ref 8–23)
CO2: 30 mmol/L (ref 22–32)
Calcium: 8.9 mg/dL (ref 8.9–10.3)
Chloride: 103 mmol/L (ref 98–111)
Creatinine: 0.67 mg/dL (ref 0.61–1.24)
GFR, Estimated: >60 mL/min (ref >60)
Glucose, Bld: 118 mg/dL — ABNORMAL HIGH (ref 70–99)
Potassium: 4.1 mmol/L (ref 3.5–5.1)
Sodium: 136 mmol/L (ref 135–145)
Total Bilirubin: 0.4 mg/dL (ref 0.3–1.2)
Total Protein: 6.5 g/dL (ref 6.5–8.1)

## 2021-09-26 LAB — TSH: TSH: 0.935 u[IU]/mL (ref 0.320–4.118)

## 2021-09-26 MED ORDER — SODIUM CHLORIDE 0.9% FLUSH
10.0000 mL | Freq: Once | INTRAVENOUS | Status: AC
  Administered 2021-09-26: 10 mL

## 2021-09-26 MED ORDER — SODIUM CHLORIDE 0.9 % IV SOLN
200.0000 mg | Freq: Once | INTRAVENOUS | Status: AC
  Administered 2021-09-26: 200 mg via INTRAVENOUS
  Filled 2021-09-26: qty 200

## 2021-09-26 MED ORDER — SODIUM CHLORIDE 0.9% FLUSH
10.0000 mL | INTRAVENOUS | Status: DC | PRN
  Administered 2021-09-26: 10 mL

## 2021-09-26 MED ORDER — SODIUM CHLORIDE 0.9 % IV SOLN: Freq: Once | INTRAVENOUS | Status: AC

## 2021-09-26 MED ORDER — HEPARIN SOD (PORK) LOCK FLUSH 100 UNIT/ML IV SOLN
500.0000 [IU] | Freq: Once | INTRAVENOUS | Status: AC | PRN
  Administered 2021-09-26: 500 [IU]
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
## 2021-09-26 NOTE — Patient Instructions (Signed)
[facility]COL[facility]  Discharge Instructions: ?Thank you for choosing [facility] to provide your oncology and hematology care.  ? ?If you have a lab appointment with the Cancer Center, please go directly to the Cancer Center and check in at the registration area. ?  ?Wear comfortable clothing and clothing appropriate for easy access to any Portacath or PICC line.  ? ?We strive to give you quality time with your provider. You may need to reschedule your appointment if you arrive late (15 or more minutes).  Arriving late affects you and other patients whose appointments are after yours.  Also, if you miss three or more appointments without notifying the office, you may be dismissed from the clinic at the provider?s discretion.    ?  ?For prescription refill requests, have your pharmacy contact our office and allow 72 hours for refills to be completed.   ? ?Today you received the following chemotherapy and/or immunotherapy agents: Keytruda ?  ?To help prevent nausea and vomiting after your treatment, we encourage you to take your nausea medication as directed. ? ?BELOW ARE SYMPTOMS THAT SHOULD BE REPORTED IMMEDIATELY: ?*FEVER GREATER THAN 100.4 F (38 ?C) OR HIGHER ?*CHILLS OR SWEATING ?*NAUSEA AND VOMITING THAT IS NOT CONTROLLED WITH YOUR NAUSEA MEDICATION ?*UNUSUAL SHORTNESS OF BREATH ?*UNUSUAL BRUISING OR BLEEDING ?*URINARY PROBLEMS (pain or burning when urinating, or frequent urination) ?*BOWEL PROBLEMS (unusual diarrhea, constipation, pain near the anus) ?TENDERNESS IN MOUTH AND THROAT WITH OR WITHOUT PRESENCE OF ULCERS (sore throat, sores in mouth, or a toothache) ?UNUSUAL RASH, SWELLING OR PAIN  ?UNUSUAL VAGINAL DISCHARGE OR ITCHING  ? ?Items with * indicate a potential emergency and should be followed up as soon as possible or go to the Emergency Department if any problems should occur. ? ?Please show the CHEMOTHERAPY ALERT CARD or IMMUNOTHERAPY ALERT CARD at check-in to the  Emergency Department and triage nurse. ? ?Should you have questions after your visit or need to cancel or reschedule your appointment, please contact [facility]  Dept: [phone]  and follow the prompts.  Office hours are 8:00 a.m. to 4:30 p.m. Monday - Friday. Please note that voicemails left after 4:00 p.m. may not be returned until the following business day.  We are closed weekends and major holidays. You have access to a nurse at all times for urgent questions. Please call the main number to the clinic Dept: [phone] and follow the prompts. ? ? ?For any non-urgent questions, you may also contact your provider using MyChart. We now offer e-Visits for anyone 18 and older to request care online for non-urgent symptoms. For details visit [email or web address]. ?  ?Also download the MyChart app! Go to the app store, search "MyChart", open the app, select , and log in with your MyChart username and password. ? ?Due to Covid, a mask is required upon entering the hospital/clinic. If you do not have a mask, one will be given to you upon arrival. For doctor visits, patients may have 1 support person aged 18 or older with them. For treatment visits, patients cannot have anyone with them due to current Covid guidelines and our immunocompromised population.  ? ?

## 2021-09-27 ENCOUNTER — Telehealth: Payer: Self-pay | Admitting: Internal Medicine

## 2021-09-27 NOTE — Telephone Encounter (Signed)
R/s per 2/24 in basket, pt wife has been called and confirmed appts

## 2021-10-01 ENCOUNTER — Other Ambulatory Visit: Payer: Medicare Other

## 2021-10-01 ENCOUNTER — Ambulatory Visit: Payer: Medicare Other

## 2021-10-01 ENCOUNTER — Ambulatory Visit: Payer: Medicare Other | Admitting: Physician Assistant
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
## 2021-10-16 ENCOUNTER — Other Ambulatory Visit: Payer: Self-pay

## 2021-10-16 DIAGNOSIS — C3492 Malignant neoplasm of unspecified part of left bronchus or lung: Secondary | ICD-10-CM

## 2021-10-17 ENCOUNTER — Other Ambulatory Visit: Payer: Medicare Other

## 2021-10-17 ENCOUNTER — Ambulatory Visit: Payer: Medicare Other

## 2021-10-17 ENCOUNTER — Ambulatory Visit: Payer: Medicare Other | Admitting: Internal Medicine

## 2021-10-17 ENCOUNTER — Inpatient Hospital Stay (HOSPITAL_BASED_OUTPATIENT_CLINIC_OR_DEPARTMENT_OTHER): Payer: Medicare Other | Admitting: Physician Assistant

## 2021-10-17 ENCOUNTER — Inpatient Hospital Stay: Payer: Medicare Other

## 2021-10-17 ENCOUNTER — Other Ambulatory Visit: Payer: Self-pay

## 2021-10-17 ENCOUNTER — Inpatient Hospital Stay: Payer: Medicare Other | Attending: Internal Medicine

## 2021-10-17 VITALS — BP 127/88 | HR 87 | Temp 97.2°F | Resp 17 | Wt 152.6 lb

## 2021-10-17 DIAGNOSIS — C3412 Malignant neoplasm of upper lobe, left bronchus or lung: Secondary | ICD-10-CM | POA: Diagnosis not present

## 2021-10-17 DIAGNOSIS — C3492 Malignant neoplasm of unspecified part of left bronchus or lung: Secondary | ICD-10-CM

## 2021-10-17 DIAGNOSIS — Z5112 Encounter for antineoplastic immunotherapy: Secondary | ICD-10-CM | POA: Diagnosis present

## 2021-10-17 DIAGNOSIS — C07 Malignant neoplasm of parotid gland: Secondary | ICD-10-CM | POA: Insufficient documentation

## 2021-10-17 DIAGNOSIS — Z79899 Other long term (current) drug therapy: Secondary | ICD-10-CM | POA: Diagnosis not present

## 2021-10-17 DIAGNOSIS — C792 Secondary malignant neoplasm of skin: Secondary | ICD-10-CM | POA: Diagnosis not present

## 2021-10-17 DIAGNOSIS — E039 Hypothyroidism, unspecified: Secondary | ICD-10-CM

## 2021-10-17 DIAGNOSIS — R630 Anorexia: Secondary | ICD-10-CM | POA: Diagnosis not present

## 2021-10-17 LAB — CBC WITH DIFFERENTIAL (CANCER CENTER ONLY)
Abs Immature Granulocytes: 0.03 10*3/uL (ref 0.00–0.07)
Basophils Absolute: 0.1 10*3/uL (ref 0.0–0.1)
Basophils Relative: 1 %
Eosinophils Absolute: 0.4 10*3/uL (ref 0.0–0.5)
Eosinophils Relative: 5 %
HCT: 39.1 % (ref 39.0–52.0)
Hemoglobin: 13.5 g/dL (ref 13.0–17.0)
Immature Granulocytes: 0 %
Lymphocytes Relative: 23 %
Lymphs Abs: 1.7 10*3/uL (ref 0.7–4.0)
MCH: 32 pg (ref 26.0–34.0)
MCHC: 34.5 g/dL (ref 30.0–36.0)
MCV: 92.7 fL (ref 80.0–100.0)
Monocytes Absolute: 0.7 10*3/uL (ref 0.1–1.0)
Monocytes Relative: 10 %
Neutro Abs: 4.3 10*3/uL (ref 1.7–7.7)
Neutrophils Relative %: 61 %
Platelet Count: 185 10*3/uL (ref 150–400)
RBC: 4.22 MIL/uL (ref 4.22–5.81)
RDW: 15.7 % — ABNORMAL HIGH (ref 11.5–15.5)
WBC Count: 7.1 10*3/uL (ref 4.0–10.5)
nRBC: 0 % (ref 0.0–0.2)

## 2021-10-17 LAB — CMP (CANCER CENTER ONLY)
ALT: 8 U/L (ref 0–44)
AST: 13 U/L — ABNORMAL LOW (ref 15–41)
Albumin: 4 g/dL (ref 3.5–5.0)
Alkaline Phosphatase: 80 U/L (ref 38–126)
Anion gap: 5 (ref 5–15)
BUN: 10 mg/dL (ref 8–23)
CO2: 29 mmol/L (ref 22–32)
Calcium: 9.4 mg/dL (ref 8.9–10.3)
Chloride: 104 mmol/L (ref 98–111)
Creatinine: 0.81 mg/dL (ref 0.61–1.24)
GFR, Estimated: >60 mL/min (ref >60)
Glucose, Bld: 169 mg/dL — ABNORMAL HIGH (ref 70–99)
Potassium: 4 mmol/L (ref 3.5–5.1)
Sodium: 138 mmol/L (ref 135–145)
Total Bilirubin: 0.7 mg/dL (ref 0.3–1.2)
Total Protein: 6.9 g/dL (ref 6.5–8.1)

## 2021-10-17 MED ORDER — LEVOTHYROXINE SODIUM 75 MCG PO TABS: 75.0000 ug | ORAL_TABLET | Freq: Every day | ORAL | 2 refills | Status: DC

## 2021-10-17 MED ORDER — MIRTAZAPINE 15 MG PO TABS: 15.0000 mg | ORAL_TABLET | Freq: Every day | ORAL | 2 refills | Status: DC

## 2021-10-17 MED ORDER — SODIUM CHLORIDE 0.9 % IV SOLN: Freq: Once | INTRAVENOUS | Status: AC

## 2021-10-17 MED ORDER — SODIUM CHLORIDE 0.9 % IV SOLN
200.0000 mg | Freq: Once | INTRAVENOUS | Status: AC
  Administered 2021-10-17: 200 mg via INTRAVENOUS
  Filled 2021-10-17: qty 200

## 2021-10-17 MED ORDER — HEPARIN SOD (PORK) LOCK FLUSH 100 UNIT/ML IV SOLN
500.0000 [IU] | Freq: Once | INTRAVENOUS | Status: AC | PRN
  Administered 2021-10-17: 500 [IU]

## 2021-10-17 MED ORDER — SODIUM CHLORIDE 0.9% FLUSH
10.0000 mL | Freq: Once | INTRAVENOUS | Status: AC
  Administered 2021-10-17: 10 mL

## 2021-10-17 MED ORDER — SODIUM CHLORIDE 0.9% FLUSH
10.0000 mL | INTRAVENOUS | Status: DC | PRN
  Administered 2021-10-17: 10 mL

## 2021-10-17 NOTE — Patient Instructions (Signed)
[facility]  Discharge Instructions: ?Thank you for choosing [facility] to provide your oncology and hematology care.  ? ?If you have a lab appointment with the [facility], please go directly to the [facility] and check in at the registration area. ?  ?Wear comfortable clothing and clothing appropriate for easy access to any Portacath or PICC line.  ? ?We strive to give you quality time with your provider. You may need to reschedule your appointment if you arrive late (15 or more minutes).  Arriving late affects you and other patients whose appointments are after yours.  Also, if you miss three or more appointments without notifying the office, you may be dismissed from the clinic at the provider?s discretion.    ?  ?For prescription refill requests, have your pharmacy contact our office and allow 72 hours for refills to be completed.   ? ?Today you received the following chemotherapy and/or immunotherapy agents keytruda    ?  ?To help prevent nausea and vomiting after your treatment, we encourage you to take your nausea medication as directed. ? ?BELOW ARE SYMPTOMS THAT SHOULD BE REPORTED IMMEDIATELY: ?*FEVER GREATER THAN 100.4 F (38 ?C) OR HIGHER ?*CHILLS OR SWEATING ?*NAUSEA AND VOMITING THAT IS NOT CONTROLLED WITH YOUR NAUSEA MEDICATION ?*UNUSUAL SHORTNESS OF BREATH ?*UNUSUAL BRUISING OR BLEEDING ?*URINARY PROBLEMS (pain or burning when urinating, or frequent urination) ?*BOWEL PROBLEMS (unusual diarrhea, constipation, pain near the anus) ?TENDERNESS IN MOUTH AND THROAT WITH OR WITHOUT PRESENCE OF ULCERS (sore throat, sores in mouth, or a toothache) ?UNUSUAL RASH, SWELLING OR PAIN  ?UNUSUAL VAGINAL DISCHARGE OR ITCHING  ? ?Items with * indicate a potential emergency and should be followed up as soon as possible or go to the Emergency Department if any problems should occur. ? ?Please show the CHEMOTHERAPY ALERT CARD or IMMUNOTHERAPY ALERT CARD at check-in to  the Emergency Department and triage nurse. ? ?Should you have questions after your visit or need to cancel or reschedule your appointment, please contact [facility]  Dept: [phone]  and follow the prompts.  Office hours are 8:00 a.m. to 4:30 p.m. Monday - Friday. Please note that voicemails left after 4:00 p.m. may not be returned until the following business day.  We are closed weekends and major holidays. You have access to a nurse at all times for urgent questions. Please call the main number to the clinic Dept: [phone] and follow the prompts. ? ? ?For any non-urgent questions, you may also contact your provider using MyChart. We now offer e-Visits for anyone 107 and older to request care online for non-urgent symptoms. For details visit [email or web address]. ?  ?Also download the MyChart app! Go to the app store, search "MyChart", open the app, select [facility], and log in with your MyChart username and password. ? ?Due to Covid, a mask is required upon entering the hospital/clinic. If you do not have a mask, one will be given to you upon arrival. For doctor visits, patients may have 1 support person aged 37 or older with them. For treatment visits, patients cannot have anyone with them due to current Covid guidelines and our immunocompromised population.  ? ?

## 2021-11-07 ENCOUNTER — Other Ambulatory Visit: Payer: Medicare Other

## 2021-11-07 ENCOUNTER — Inpatient Hospital Stay (HOSPITAL_BASED_OUTPATIENT_CLINIC_OR_DEPARTMENT_OTHER): Payer: Medicare Other | Admitting: Internal Medicine

## 2021-11-07 ENCOUNTER — Inpatient Hospital Stay: Payer: Medicare Other

## 2021-11-07 ENCOUNTER — Encounter: Payer: Self-pay | Admitting: Internal Medicine

## 2021-11-07 ENCOUNTER — Inpatient Hospital Stay: Payer: Medicare Other | Attending: Internal Medicine

## 2021-11-07 ENCOUNTER — Ambulatory Visit: Payer: Medicare Other | Admitting: Physician Assistant

## 2021-11-07 ENCOUNTER — Other Ambulatory Visit: Payer: Self-pay

## 2021-11-07 ENCOUNTER — Ambulatory Visit: Payer: Medicare Other

## 2021-11-07 VITALS — BP 118/86

## 2021-11-07 VITALS — BP 157/100 | HR 94 | Temp 97.1°F | Resp 18 | Wt 158.1 lb

## 2021-11-07 DIAGNOSIS — C3492 Malignant neoplasm of unspecified part of left bronchus or lung: Secondary | ICD-10-CM | POA: Diagnosis not present

## 2021-11-07 DIAGNOSIS — Z79899 Other long term (current) drug therapy: Secondary | ICD-10-CM | POA: Insufficient documentation

## 2021-11-07 DIAGNOSIS — Z5112 Encounter for antineoplastic immunotherapy: Secondary | ICD-10-CM

## 2021-11-07 DIAGNOSIS — C3412 Malignant neoplasm of upper lobe, left bronchus or lung: Secondary | ICD-10-CM | POA: Diagnosis not present

## 2021-11-07 DIAGNOSIS — Z95828 Presence of other vascular implants and grafts: Secondary | ICD-10-CM

## 2021-11-07 DIAGNOSIS — C07 Malignant neoplasm of parotid gland: Secondary | ICD-10-CM | POA: Diagnosis not present

## 2021-11-07 LAB — CBC WITH DIFFERENTIAL (CANCER CENTER ONLY)
Abs Immature Granulocytes: 0.03 10*3/uL (ref 0.00–0.07)
Basophils Absolute: 0.1 10*3/uL (ref 0.0–0.1)
Basophils Relative: 1 %
Eosinophils Absolute: 0.4 10*3/uL (ref 0.0–0.5)
Eosinophils Relative: 5 %
HCT: 39.6 % (ref 39.0–52.0)
Hemoglobin: 13.2 g/dL (ref 13.0–17.0)
Immature Granulocytes: 1 %
Lymphocytes Relative: 19 %
Lymphs Abs: 1.3 10*3/uL (ref 0.7–4.0)
MCH: 31.6 pg (ref 26.0–34.0)
MCHC: 33.3 g/dL (ref 30.0–36.0)
MCV: 94.7 fL (ref 80.0–100.0)
Monocytes Absolute: 0.6 10*3/uL (ref 0.1–1.0)
Monocytes Relative: 10 %
Neutro Abs: 4.3 10*3/uL (ref 1.7–7.7)
Neutrophils Relative %: 64 %
Platelet Count: 226 10*3/uL (ref 150–400)
RBC: 4.18 MIL/uL — ABNORMAL LOW (ref 4.22–5.81)
RDW: 15.5 % (ref 11.5–15.5)
WBC Count: 6.6 10*3/uL (ref 4.0–10.5)
nRBC: 0 % (ref 0.0–0.2)

## 2021-11-07 LAB — CMP (CANCER CENTER ONLY)
ALT: 7 U/L (ref 0–44)
AST: 12 U/L — ABNORMAL LOW (ref 15–41)
Albumin: 3.8 g/dL (ref 3.5–5.0)
Alkaline Phosphatase: 90 U/L (ref 38–126)
Anion gap: 5 (ref 5–15)
BUN: 5 mg/dL — ABNORMAL LOW (ref 8–23)
CO2: 29 mmol/L (ref 22–32)
Calcium: 9 mg/dL (ref 8.9–10.3)
Chloride: 104 mmol/L (ref 98–111)
Creatinine: 0.76 mg/dL (ref 0.61–1.24)
GFR, Estimated: >60 mL/min (ref >60)
Glucose, Bld: 177 mg/dL — ABNORMAL HIGH (ref 70–99)
Potassium: 4.1 mmol/L (ref 3.5–5.1)
Sodium: 138 mmol/L (ref 135–145)
Total Bilirubin: 0.3 mg/dL (ref 0.3–1.2)
Total Protein: 6.7 g/dL (ref 6.5–8.1)

## 2021-11-07 LAB — TSH: TSH: 2.22 u[IU]/mL (ref 0.320–4.118)

## 2021-11-07 MED ORDER — SODIUM CHLORIDE 0.9 % IV SOLN
200.0000 mg | Freq: Once | INTRAVENOUS | Status: AC
  Administered 2021-11-07: 200 mg via INTRAVENOUS
  Filled 2021-11-07: qty 200

## 2021-11-07 MED ORDER — HEPARIN SOD (PORK) LOCK FLUSH 100 UNIT/ML IV SOLN
500.0000 [IU] | Freq: Once | INTRAVENOUS | Status: AC | PRN
  Administered 2021-11-07: 500 [IU]

## 2021-11-07 MED ORDER — SODIUM CHLORIDE 0.9 % IV SOLN: Freq: Once | INTRAVENOUS | Status: AC

## 2021-11-07 MED ORDER — SODIUM CHLORIDE 0.9% FLUSH
10.0000 mL | Freq: Once | INTRAVENOUS | Status: AC
  Administered 2021-11-07: 10 mL

## 2021-11-07 MED ORDER — SODIUM CHLORIDE 0.9% FLUSH
10.0000 mL | INTRAVENOUS | Status: DC | PRN
  Administered 2021-11-07: 10 mL
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
## 2021-11-07 NOTE — Patient Instructions (Signed)
[facility]  Discharge Instructions: ?Thank you for choosing [facility] to provide your oncology and hematology care.  ? ?If you have a lab appointment with the [facility], please go directly to the [facility] and check in at the registration area. ?  ?Wear comfortable clothing and clothing appropriate for easy access to any Portacath or PICC line.  ? ?We strive to give you quality time with your provider. You may need to reschedule your appointment if you arrive late (15 or more minutes).  Arriving late affects you and other patients whose appointments are after yours.  Also, if you miss three or more appointments without notifying the office, you may be dismissed from the clinic at the provider?s discretion.    ?  ?For prescription refill requests, have your pharmacy contact our office and allow 72 hours for refills to be completed.   ? ?Today you received the following chemotherapy and/or immunotherapy agent: Keytruda    ?  ?To help prevent nausea and vomiting after your treatment, we encourage you to take your nausea medication as directed. ? ?BELOW ARE SYMPTOMS THAT SHOULD BE REPORTED IMMEDIATELY: ?*FEVER GREATER THAN 100.4 F (38 ?C) OR HIGHER ?*CHILLS OR SWEATING ?*NAUSEA AND VOMITING THAT IS NOT CONTROLLED WITH YOUR NAUSEA MEDICATION ?*UNUSUAL SHORTNESS OF BREATH ?*UNUSUAL BRUISING OR BLEEDING ?*URINARY PROBLEMS (pain or burning when urinating, or frequent urination) ?*BOWEL PROBLEMS (unusual diarrhea, constipation, pain near the anus) ?TENDERNESS IN MOUTH AND THROAT WITH OR WITHOUT PRESENCE OF ULCERS (sore throat, sores in mouth, or a toothache) ?UNUSUAL RASH, SWELLING OR PAIN  ?UNUSUAL VAGINAL DISCHARGE OR ITCHING  ? ?Items with * indicate a potential emergency and should be followed up as soon as possible or go to the Emergency Department if any problems should occur. ? ?Please show the CHEMOTHERAPY ALERT CARD or IMMUNOTHERAPY ALERT CARD at check-in to  the Emergency Department and triage nurse. ? ?Should you have questions after your visit or need to cancel or reschedule your appointment, please contact [facility]  Dept: [phone]  and follow the prompts.  Office hours are 8:00 a.m. to 4:30 p.m. Monday - Friday. Please note that voicemails left after 4:00 p.m. may not be returned until the following business day.  We are closed weekends and major holidays. You have access to a nurse at all times for urgent questions. Please call the main number to the clinic Dept: [phone] and follow the prompts. ? ? ?For any non-urgent questions, you may also contact your provider using MyChart. We now offer e-Visits for anyone 22 and older to request care online for non-urgent symptoms. For details visit [email or web address]. ?  ?Also download the MyChart app! Go to the app store, search "MyChart", open the app, select , and log in with your MyChart username and password. ? ?Due to Covid, a mask is required upon entering the hospital/clinic. If you do not have a mask, one will be given to you upon arrival. For doctor visits, patients may have 1 support person aged 77 or older with them. For treatment visits, patients cannot have anyone with them due to current Covid guidelines and our immunocompromised population.  ? ?

## 2021-11-28 ENCOUNTER — Other Ambulatory Visit: Payer: Self-pay

## 2021-11-28 ENCOUNTER — Inpatient Hospital Stay (HOSPITAL_BASED_OUTPATIENT_CLINIC_OR_DEPARTMENT_OTHER): Payer: Medicare Other | Admitting: Internal Medicine

## 2021-11-28 ENCOUNTER — Other Ambulatory Visit: Payer: Self-pay | Admitting: Physician Assistant

## 2021-11-28 ENCOUNTER — Inpatient Hospital Stay: Payer: Medicare Other

## 2021-11-28 ENCOUNTER — Other Ambulatory Visit: Payer: Medicare Other

## 2021-11-28 VITALS — Resp 17

## 2021-11-28 VITALS — BP 110/70 | HR 66 | Temp 97.8°F | Wt 158.4 lb

## 2021-11-28 DIAGNOSIS — C3412 Malignant neoplasm of upper lobe, left bronchus or lung: Secondary | ICD-10-CM

## 2021-11-28 DIAGNOSIS — Z95828 Presence of other vascular implants and grafts: Secondary | ICD-10-CM

## 2021-11-28 DIAGNOSIS — C3490 Malignant neoplasm of unspecified part of unspecified bronchus or lung: Secondary | ICD-10-CM

## 2021-11-28 DIAGNOSIS — I8290 Acute embolism and thrombosis of unspecified vein: Secondary | ICD-10-CM

## 2021-11-28 DIAGNOSIS — C3492 Malignant neoplasm of unspecified part of left bronchus or lung: Secondary | ICD-10-CM

## 2021-11-28 DIAGNOSIS — Z5112 Encounter for antineoplastic immunotherapy: Secondary | ICD-10-CM | POA: Diagnosis not present

## 2021-11-28 LAB — CBC WITH DIFFERENTIAL (CANCER CENTER ONLY)
Abs Immature Granulocytes: 0.02 10*3/uL (ref 0.00–0.07)
Basophils Absolute: 0.1 10*3/uL (ref 0.0–0.1)
Basophils Relative: 1 %
Eosinophils Absolute: 0.4 10*3/uL (ref 0.0–0.5)
Eosinophils Relative: 6 %
HCT: 37.1 % — ABNORMAL LOW (ref 39.0–52.0)
Hemoglobin: 12.5 g/dL — ABNORMAL LOW (ref 13.0–17.0)
Immature Granulocytes: 0 %
Lymphocytes Relative: 13 %
Lymphs Abs: 0.8 10*3/uL (ref 0.7–4.0)
MCH: 32.1 pg (ref 26.0–34.0)
MCHC: 33.7 g/dL (ref 30.0–36.0)
MCV: 95.1 fL (ref 80.0–100.0)
Monocytes Absolute: 0.6 10*3/uL (ref 0.1–1.0)
Monocytes Relative: 9 %
Neutro Abs: 4.8 10*3/uL (ref 1.7–7.7)
Neutrophils Relative %: 71 %
Platelet Count: 175 10*3/uL (ref 150–400)
RBC: 3.9 MIL/uL — ABNORMAL LOW (ref 4.22–5.81)
RDW: 14.5 % (ref 11.5–15.5)
WBC Count: 6.7 10*3/uL (ref 4.0–10.5)
nRBC: 0 % (ref 0.0–0.2)

## 2021-11-28 LAB — TSH: TSH: 1.271 u[IU]/mL (ref 0.350–4.500)

## 2021-11-28 LAB — CMP (CANCER CENTER ONLY)
ALT: 7 U/L (ref 0–44)
AST: 13 U/L — ABNORMAL LOW (ref 15–41)
Albumin: 3.8 g/dL (ref 3.5–5.0)
Alkaline Phosphatase: 85 U/L (ref 38–126)
Anion gap: 3 — ABNORMAL LOW (ref 5–15)
BUN: 9 mg/dL (ref 8–23)
CO2: 30 mmol/L (ref 22–32)
Calcium: 8.7 mg/dL — ABNORMAL LOW (ref 8.9–10.3)
Chloride: 104 mmol/L (ref 98–111)
Creatinine: 0.79 mg/dL (ref 0.61–1.24)
GFR, Estimated: >60 mL/min (ref >60)
Glucose, Bld: 188 mg/dL — ABNORMAL HIGH (ref 70–99)
Potassium: 3.7 mmol/L (ref 3.5–5.1)
Sodium: 137 mmol/L (ref 135–145)
Total Bilirubin: 0.3 mg/dL (ref 0.3–1.2)
Total Protein: 6.3 g/dL — ABNORMAL LOW (ref 6.5–8.1)

## 2021-11-28 MED ORDER — SODIUM CHLORIDE 0.9% FLUSH
10.0000 mL | Freq: Once | INTRAVENOUS | Status: AC
  Administered 2021-11-28: 10 mL

## 2021-11-28 MED ORDER — SODIUM CHLORIDE 0.9 % IV SOLN: Freq: Once | INTRAVENOUS | Status: AC

## 2021-11-28 MED ORDER — HEPARIN SOD (PORK) LOCK FLUSH 100 UNIT/ML IV SOLN
500.0000 [IU] | Freq: Once | INTRAVENOUS | Status: AC | PRN
  Administered 2021-11-28: 500 [IU]

## 2021-11-28 MED ORDER — SODIUM CHLORIDE 0.9 % IV SOLN
200.0000 mg | Freq: Once | INTRAVENOUS | Status: AC
  Administered 2021-11-28: 200 mg via INTRAVENOUS
  Filled 2021-11-28: qty 200

## 2021-11-28 MED ORDER — SODIUM CHLORIDE 0.9% FLUSH
10.0000 mL | INTRAVENOUS | Status: DC | PRN
  Administered 2021-11-28: 10 mL

## 2021-11-28 NOTE — Patient Instructions (Signed)
[facility]  Discharge Instructions: ?Thank you for choosing [facility] to provide your oncology and hematology care.  ? ?If you have a lab appointment with the Cancer Center, please go directly to the Cancer Center and check in at the registration area. ?  ?Wear comfortable clothing and clothing appropriate for easy access to any Portacath or PICC line.  ? ?We strive to give you quality time with your provider. You may need to reschedule your appointment if you arrive late (15 or more minutes).  Arriving late affects you and other patients whose appointments are after yours.  Also, if you miss three or more appointments without notifying the office, you may be dismissed from the clinic at the provider?s discretion.    ?  ?For prescription refill requests, have your pharmacy contact our office and allow 72 hours for refills to be completed.   ? ?Today you received the following chemotherapy and/or immunotherapy agents: Keytruda ?  ?To help prevent nausea and vomiting after your treatment, we encourage you to take your nausea medication as directed. ? ?BELOW ARE SYMPTOMS THAT SHOULD BE REPORTED IMMEDIATELY: ?*FEVER GREATER THAN 100.4 F (38 ?C) OR HIGHER ?*CHILLS OR SWEATING ?*NAUSEA AND VOMITING THAT IS NOT CONTROLLED WITH YOUR NAUSEA MEDICATION ?*UNUSUAL SHORTNESS OF BREATH ?*UNUSUAL BRUISING OR BLEEDING ?*URINARY PROBLEMS (pain or burning when urinating, or frequent urination) ?*BOWEL PROBLEMS (unusual diarrhea, constipation, pain near the anus) ?TENDERNESS IN MOUTH AND THROAT WITH OR WITHOUT PRESENCE OF ULCERS (sore throat, sores in mouth, or a toothache) ?UNUSUAL RASH, SWELLING OR PAIN  ?UNUSUAL VAGINAL DISCHARGE OR ITCHING  ? ?Items with * indicate a potential emergency and should be followed up as soon as possible or go to the Emergency Department if any problems should occur. ? ?Please show the CHEMOTHERAPY ALERT CARD or IMMUNOTHERAPY ALERT CARD at check-in to the  Emergency Department and triage nurse. ? ?Should you have questions after your visit or need to cancel or reschedule your appointment, please contact [facility]  Dept: [phone]  and follow the prompts.  Office hours are 8:00 a.m. to 4:30 p.m. Monday - Friday. Please note that voicemails left after 4:00 p.m. may not be returned until the following business day.  We are closed weekends and major holidays. You have access to a nurse at all times for urgent questions. Please call the main number to the clinic Dept: [phone] and follow the prompts. ? ? ?For any non-urgent questions, you may also contact your provider using MyChart. We now offer e-Visits for anyone 18 and older to request care online for non-urgent symptoms. For details visit [email or web address]. ?  ?Also download the MyChart app! Go to the app store, search "MyChart", open the app, select [facility], and log in with your MyChart username and password. ? ?Due to Covid, a mask is required upon entering the hospital/clinic. If you do not have a mask, one will be given to you upon arrival. For doctor visits, patients may have 1 support person aged 18 or older with them. For treatment visits, patients cannot have anyone with them due to current Covid guidelines and our immunocompromised population.  ? ?
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
## 2021-12-03 ENCOUNTER — Encounter: Payer: Self-pay | Admitting: Internal Medicine

## 2021-12-24 ENCOUNTER — Inpatient Hospital Stay: Payer: Medicare Other | Attending: Internal Medicine

## 2021-12-24 ENCOUNTER — Other Ambulatory Visit: Payer: Self-pay

## 2021-12-24 ENCOUNTER — Ambulatory Visit (HOSPITAL_COMMUNITY)
Admission: RE | Admit: 2021-12-24 | Discharge: 2021-12-24 | Disposition: A | Discharge status: Home / Self Care | Payer: Medicare Other | Source: Ambulatory Visit | Attending: Internal Medicine | Admitting: Internal Medicine

## 2021-12-24 DIAGNOSIS — C3412 Malignant neoplasm of upper lobe, left bronchus or lung: Secondary | ICD-10-CM | POA: Diagnosis present

## 2021-12-24 DIAGNOSIS — F32A Depression, unspecified: Secondary | ICD-10-CM | POA: Insufficient documentation

## 2021-12-24 DIAGNOSIS — E039 Hypothyroidism, unspecified: Secondary | ICD-10-CM | POA: Diagnosis not present

## 2021-12-24 DIAGNOSIS — C3490 Malignant neoplasm of unspecified part of unspecified bronchus or lung: Secondary | ICD-10-CM | POA: Insufficient documentation

## 2021-12-24 DIAGNOSIS — Z79899 Other long term (current) drug therapy: Secondary | ICD-10-CM | POA: Insufficient documentation

## 2021-12-24 DIAGNOSIS — Z95828 Presence of other vascular implants and grafts: Secondary | ICD-10-CM

## 2021-12-24 DIAGNOSIS — C3492 Malignant neoplasm of unspecified part of left bronchus or lung: Secondary | ICD-10-CM

## 2021-12-24 LAB — CBC WITH DIFFERENTIAL (CANCER CENTER ONLY)
Abs Immature Granulocytes: 0.01 10*3/uL (ref 0.00–0.07)
Basophils Absolute: 0.1 10*3/uL (ref 0.0–0.1)
Basophils Relative: 1 %
Eosinophils Absolute: 0.4 10*3/uL (ref 0.0–0.5)
Eosinophils Relative: 5 %
HCT: 40 % (ref 39.0–52.0)
Hemoglobin: 13.8 g/dL (ref 13.0–17.0)
Immature Granulocytes: 0 %
Lymphocytes Relative: 21 %
Lymphs Abs: 1.4 10*3/uL (ref 0.7–4.0)
MCH: 32.6 pg (ref 26.0–34.0)
MCHC: 34.5 g/dL (ref 30.0–36.0)
MCV: 94.6 fL (ref 80.0–100.0)
Monocytes Absolute: 0.5 10*3/uL (ref 0.1–1.0)
Monocytes Relative: 8 %
Neutro Abs: 4.2 10*3/uL (ref 1.7–7.7)
Neutrophils Relative %: 65 %
Platelet Count: 192 10*3/uL (ref 150–400)
RBC: 4.23 MIL/uL (ref 4.22–5.81)
RDW: 13.8 % (ref 11.5–15.5)
WBC Count: 6.5 10*3/uL (ref 4.0–10.5)
nRBC: 0 % (ref 0.0–0.2)

## 2021-12-24 LAB — CMP (CANCER CENTER ONLY)
ALT: 7 U/L (ref 0–44)
AST: 12 U/L — ABNORMAL LOW (ref 15–41)
Albumin: 3.9 g/dL (ref 3.5–5.0)
Alkaline Phosphatase: 83 U/L (ref 38–126)
Anion gap: 6 (ref 5–15)
BUN: 8 mg/dL (ref 8–23)
CO2: 28 mmol/L (ref 22–32)
Calcium: 9 mg/dL (ref 8.9–10.3)
Chloride: 104 mmol/L (ref 98–111)
Creatinine: 0.86 mg/dL (ref 0.61–1.24)
GFR, Estimated: >60 mL/min (ref >60)
Glucose, Bld: 222 mg/dL — ABNORMAL HIGH (ref 70–99)
Potassium: 3.9 mmol/L (ref 3.5–5.1)
Sodium: 138 mmol/L (ref 135–145)
Total Bilirubin: 0.5 mg/dL (ref 0.3–1.2)
Total Protein: 6.8 g/dL (ref 6.5–8.1)

## 2021-12-24 MED ORDER — SODIUM CHLORIDE 0.9% FLUSH
10.0000 mL | Freq: Once | INTRAVENOUS | Status: AC
  Administered 2021-12-24: 10 mL

## 2021-12-24 MED ORDER — SODIUM CHLORIDE (PF) 0.9 % IJ SOLN
INTRAMUSCULAR | Status: AC
  Filled 2021-12-24: qty 50

## 2021-12-24 MED ORDER — IOHEXOL 300 MG/ML  SOLN
100.0000 mL | Freq: Once | INTRAMUSCULAR | Status: AC | PRN
Start: 2021-12-24 — End: 2021-12-24
  Administered 2021-12-24: 100 mL via INTRAVENOUS

## 2021-12-24 MED ORDER — HEPARIN SOD (PORK) LOCK FLUSH 100 UNIT/ML IV SOLN
INTRAVENOUS | Status: AC
  Administered 2021-12-24: 500 [IU]
  Filled 2021-12-24: qty 5

## 2021-12-25 ENCOUNTER — Telehealth: Payer: Self-pay | Admitting: Internal Medicine

## 2021-12-25 NOTE — Telephone Encounter (Signed)
Called patient regarding upcoming appointments, patient is notified. 

## 2021-12-26 ENCOUNTER — Inpatient Hospital Stay (HOSPITAL_BASED_OUTPATIENT_CLINIC_OR_DEPARTMENT_OTHER): Payer: Medicare Other | Admitting: Internal Medicine

## 2021-12-26 ENCOUNTER — Other Ambulatory Visit: Payer: Self-pay

## 2021-12-26 VITALS — BP 104/70 | HR 80 | Temp 96.8°F | Resp 17 | Wt 160.5 lb

## 2021-12-26 DIAGNOSIS — C3412 Malignant neoplasm of upper lobe, left bronchus or lung: Secondary | ICD-10-CM | POA: Diagnosis not present

## 2021-12-26 DIAGNOSIS — C3490 Malignant neoplasm of unspecified part of unspecified bronchus or lung: Secondary | ICD-10-CM
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
## 2022-01-05 ENCOUNTER — Other Ambulatory Visit: Payer: Self-pay | Admitting: Physician Assistant

## 2022-01-05 DIAGNOSIS — R630 Anorexia: Secondary | ICD-10-CM

## 2022-01-05 DIAGNOSIS — C3492 Malignant neoplasm of unspecified part of left bronchus or lung: Secondary | ICD-10-CM

## 2022-01-22 ENCOUNTER — Telehealth: Payer: Self-pay | Admitting: Internal Medicine

## 2022-01-22 NOTE — Telephone Encounter (Signed)
Called patient regarding upcoming August appointments, left a voicemail.

## 2022-01-27 ENCOUNTER — Other Ambulatory Visit: Payer: Self-pay | Admitting: Physician Assistant

## 2022-01-27 DIAGNOSIS — C3492 Malignant neoplasm of unspecified part of left bronchus or lung: Secondary | ICD-10-CM

## 2022-01-27 DIAGNOSIS — R630 Anorexia: Secondary | ICD-10-CM

## 2022-01-28 ENCOUNTER — Encounter: Payer: Self-pay | Admitting: Neurology

## 2022-01-28 ENCOUNTER — Ambulatory Visit (INDEPENDENT_AMBULATORY_CARE_PROVIDER_SITE_OTHER): Payer: Medicare Other | Admitting: Neurology

## 2022-01-28 VITALS — BP 124/102 | HR 98 | Ht 69.0 in | Wt 155.0 lb

## 2022-01-28 DIAGNOSIS — E119 Type 2 diabetes mellitus without complications: Secondary | ICD-10-CM | POA: Insufficient documentation

## 2022-01-28 DIAGNOSIS — E1159 Type 2 diabetes mellitus with other circulatory complications: Secondary | ICD-10-CM

## 2022-01-28 DIAGNOSIS — E1151 Type 2 diabetes mellitus with diabetic peripheral angiopathy without gangrene: Secondary | ICD-10-CM

## 2022-01-28 DIAGNOSIS — E0842 Diabetes mellitus due to underlying condition with diabetic polyneuropathy: Secondary | ICD-10-CM

## 2022-01-31 ENCOUNTER — Other Ambulatory Visit: Payer: Self-pay | Admitting: Physician Assistant

## 2022-02-21 ENCOUNTER — Other Ambulatory Visit: Payer: Self-pay | Admitting: Physician Assistant

## 2022-02-21 DIAGNOSIS — C3492 Malignant neoplasm of unspecified part of left bronchus or lung: Secondary | ICD-10-CM

## 2022-02-21 MED ORDER — MIRTAZAPINE 15 MG PO TABS: 15.0000 mg | ORAL_TABLET | Freq: Every day | ORAL | 2 refills | Status: DC

## 2022-03-22 ENCOUNTER — Other Ambulatory Visit: Payer: Self-pay | Admitting: Physician Assistant

## 2022-03-22 DIAGNOSIS — E039 Hypothyroidism, unspecified: Secondary | ICD-10-CM

## 2022-03-24 ENCOUNTER — Other Ambulatory Visit: Payer: Self-pay | Admitting: Physician Assistant

## 2022-03-24 DIAGNOSIS — E039 Hypothyroidism, unspecified: Secondary | ICD-10-CM

## 2022-03-24 MED ORDER — LEVOTHYROXINE SODIUM 75 MCG PO TABS: 75.0000 ug | ORAL_TABLET | Freq: Every day | ORAL | 2 refills | Status: DC

## 2022-03-28 ENCOUNTER — Inpatient Hospital Stay: Payer: Medicare Other | Attending: Internal Medicine

## 2022-03-28 ENCOUNTER — Ambulatory Visit (HOSPITAL_COMMUNITY): Payer: Medicare Other

## 2022-03-31 ENCOUNTER — Inpatient Hospital Stay: Payer: Medicare Other | Admitting: Internal Medicine

## 2022-04-03 ENCOUNTER — Ambulatory Visit (HOSPITAL_COMMUNITY): Admission: RE | Admit: 2022-04-03 | Payer: Medicare Other | Source: Ambulatory Visit

## 2022-04-09 ENCOUNTER — Telehealth: Payer: Self-pay

## 2022-04-09 NOTE — Telephone Encounter (Signed)
I have discussed this with the Transportation Coordinator who advised that we can possibly assist the pt at this time but this is contingent on whether the pt has transportation available through his health plan. The Coordinator will contact pts insurance to verify this first, then let us know. From that point we will know how to proceed with helping the pt get to his appt. Response is pending at this time.

## 2022-04-09 NOTE — Telephone Encounter (Signed)
I spoke with pts wife regarding pts cancelling his CT scan as this is unusual for this pt.   She advised that they have been experiencing significant transportation issues. I have advised that we may be able to assist with getting him to the scan with [facility] transportation but I would need to confirm this with the Transportation Coordinator. She was very interested in this as an option.

## 2022-04-10 NOTE — Telephone Encounter (Signed)
The Transportation Coordinator has confirmed that transportation services are not available via his insurance carrier, therefore we can assist the pt with getting to his appts.   I have called pts wife back and left a message advising of this. I also provided the direct contact number to the coordinator and his back person.

## 2022-04-14 ENCOUNTER — Telehealth: Payer: Self-pay | Admitting: Internal Medicine

## 2022-04-14 ENCOUNTER — Other Ambulatory Visit: Payer: Self-pay

## 2022-04-14 ENCOUNTER — Ambulatory Visit (HOSPITAL_COMMUNITY)
Admission: RE | Admit: 2022-04-14 | Discharge: 2022-04-14 | Disposition: A | Discharge status: Home / Self Care | Payer: Medicare Other | Source: Ambulatory Visit | Attending: Internal Medicine | Admitting: Internal Medicine

## 2022-04-14 ENCOUNTER — Inpatient Hospital Stay: Payer: Medicare Other | Attending: Internal Medicine

## 2022-04-14 ENCOUNTER — Telehealth: Payer: Self-pay | Admitting: Medical Oncology

## 2022-04-14 DIAGNOSIS — C3490 Malignant neoplasm of unspecified part of unspecified bronchus or lung: Secondary | ICD-10-CM | POA: Insufficient documentation

## 2022-04-14 DIAGNOSIS — Z95828 Presence of other vascular implants and grafts: Secondary | ICD-10-CM

## 2022-04-14 DIAGNOSIS — Z85828 Personal history of other malignant neoplasm of skin: Secondary | ICD-10-CM | POA: Insufficient documentation

## 2022-04-14 DIAGNOSIS — C3492 Malignant neoplasm of unspecified part of left bronchus or lung: Secondary | ICD-10-CM

## 2022-04-14 DIAGNOSIS — C3412 Malignant neoplasm of upper lobe, left bronchus or lung: Secondary | ICD-10-CM | POA: Insufficient documentation

## 2022-04-14 LAB — CBC WITH DIFFERENTIAL (CANCER CENTER ONLY)
Abs Immature Granulocytes: 0.02 10*3/uL (ref 0.00–0.07)
Basophils Absolute: 0.1 10*3/uL (ref 0.0–0.1)
Basophils Relative: 1 %
Eosinophils Absolute: 0.3 10*3/uL (ref 0.0–0.5)
Eosinophils Relative: 4 %
HCT: 42.4 % (ref 39.0–52.0)
Hemoglobin: 14.8 g/dL (ref 13.0–17.0)
Immature Granulocytes: 0 %
Lymphocytes Relative: 24 %
Lymphs Abs: 2 10*3/uL (ref 0.7–4.0)
MCH: 33.3 pg (ref 26.0–34.0)
MCHC: 34.9 g/dL (ref 30.0–36.0)
MCV: 95.3 fL (ref 80.0–100.0)
Monocytes Absolute: 0.5 10*3/uL (ref 0.1–1.0)
Monocytes Relative: 6 %
Neutro Abs: 5.3 10*3/uL (ref 1.7–7.7)
Neutrophils Relative %: 65 %
Platelet Count: 205 10*3/uL (ref 150–400)
RBC: 4.45 MIL/uL (ref 4.22–5.81)
RDW: 13.8 % (ref 11.5–15.5)
WBC Count: 8.3 10*3/uL (ref 4.0–10.5)
nRBC: 0 % (ref 0.0–0.2)

## 2022-04-14 LAB — CMP (CANCER CENTER ONLY)
ALT: 5 U/L (ref 0–44)
AST: 12 U/L — ABNORMAL LOW (ref 15–41)
Albumin: 4.2 g/dL (ref 3.5–5.0)
Alkaline Phosphatase: 82 U/L (ref 38–126)
Anion gap: 5 (ref 5–15)
BUN: 6 mg/dL — ABNORMAL LOW (ref 8–23)
CO2: 30 mmol/L (ref 22–32)
Calcium: 9.6 mg/dL (ref 8.9–10.3)
Chloride: 103 mmol/L (ref 98–111)
Creatinine: 0.8 mg/dL (ref 0.61–1.24)
GFR, Estimated: >60 mL/min (ref >60)
Glucose, Bld: 110 mg/dL — ABNORMAL HIGH (ref 70–99)
Potassium: 4 mmol/L (ref 3.5–5.1)
Sodium: 138 mmol/L (ref 135–145)
Total Bilirubin: 0.4 mg/dL (ref 0.3–1.2)
Total Protein: 7 g/dL (ref 6.5–8.1)

## 2022-04-14 MED ORDER — HEPARIN SOD (PORK) LOCK FLUSH 100 UNIT/ML IV SOLN
INTRAVENOUS | Status: AC
  Filled 2022-04-14: qty 5

## 2022-04-14 MED ORDER — SODIUM CHLORIDE 0.9% FLUSH
10.0000 mL | Freq: Once | INTRAVENOUS | Status: AC
  Administered 2022-04-14: 10 mL

## 2022-04-14 MED ORDER — HEPARIN SOD (PORK) LOCK FLUSH 100 UNIT/ML IV SOLN
500.0000 [IU] | Freq: Once | INTRAVENOUS | Status: AC
Start: 2022-04-14 — End: 2022-04-14
  Administered 2022-04-14: 500 [IU] via INTRAVENOUS

## 2022-04-14 MED ORDER — IOHEXOL 300 MG/ML  SOLN
100.0000 mL | Freq: Once | INTRAMUSCULAR | Status: AC | PRN
Start: 2022-04-14 — End: 2022-04-14
  Administered 2022-04-14: 100 mL via INTRAVENOUS

## 2022-04-14 MED ORDER — IOHEXOL 9 MG/ML PO SOLN
1000.0000 mL | Freq: Once | ORAL | Status: AC
  Administered 2022-04-14: 1000 mL via ORAL

## 2022-04-14 MED ORDER — SODIUM CHLORIDE (PF) 0.9 % IJ SOLN
INTRAMUSCULAR | Status: AC
  Filled 2022-04-14: qty 50

## 2022-04-14 NOTE — Telephone Encounter (Signed)
Requests appt  today for port flush w lab for CT scan and f/u appt.

## 2022-04-14 NOTE — Telephone Encounter (Signed)
Scheduled per 9/11 in basket, message has been left

## 2022-04-16 ENCOUNTER — Inpatient Hospital Stay (HOSPITAL_BASED_OUTPATIENT_CLINIC_OR_DEPARTMENT_OTHER): Payer: Medicare Other | Admitting: Internal Medicine

## 2022-04-16 ENCOUNTER — Other Ambulatory Visit: Payer: Self-pay

## 2022-04-16 VITALS — BP 148/91 | HR 75 | Temp 98.3°F | Ht 69.0 in | Wt 154.0 lb

## 2022-04-16 DIAGNOSIS — Z85828 Personal history of other malignant neoplasm of skin: Secondary | ICD-10-CM | POA: Diagnosis not present

## 2022-04-16 DIAGNOSIS — C3490 Malignant neoplasm of unspecified part of unspecified bronchus or lung: Secondary | ICD-10-CM

## 2022-04-16 DIAGNOSIS — C3412 Malignant neoplasm of upper lobe, left bronchus or lung: Secondary | ICD-10-CM | POA: Diagnosis not present
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
## 2022-06-01 ENCOUNTER — Other Ambulatory Visit: Payer: Self-pay | Admitting: Physician Assistant

## 2022-06-01 DIAGNOSIS — C3492 Malignant neoplasm of unspecified part of left bronchus or lung: Secondary | ICD-10-CM

## 2022-07-01 ENCOUNTER — Telehealth: Payer: Self-pay | Admitting: Medical Oncology

## 2022-07-01 NOTE — Telephone Encounter (Signed)
Requests port access and labs for CT scan . Schedule message sent.

## 2022-07-18 ENCOUNTER — Ambulatory Visit (HOSPITAL_COMMUNITY)
Admission: RE | Admit: 2022-07-18 | Discharge: 2022-07-18 | Disposition: A | Discharge status: Home / Self Care | Payer: Medicare Other | Source: Ambulatory Visit | Attending: Internal Medicine | Admitting: Internal Medicine

## 2022-07-18 ENCOUNTER — Inpatient Hospital Stay: Payer: Medicare Other | Attending: Internal Medicine

## 2022-07-18 ENCOUNTER — Other Ambulatory Visit: Payer: Self-pay | Admitting: Physician Assistant

## 2022-07-18 DIAGNOSIS — C3490 Malignant neoplasm of unspecified part of unspecified bronchus or lung: Secondary | ICD-10-CM

## 2022-07-18 DIAGNOSIS — E039 Hypothyroidism, unspecified: Secondary | ICD-10-CM | POA: Insufficient documentation

## 2022-07-18 DIAGNOSIS — C3492 Malignant neoplasm of unspecified part of left bronchus or lung: Secondary | ICD-10-CM

## 2022-07-18 DIAGNOSIS — Z95828 Presence of other vascular implants and grafts: Secondary | ICD-10-CM

## 2022-07-18 DIAGNOSIS — Z85118 Personal history of other malignant neoplasm of bronchus and lung: Secondary | ICD-10-CM | POA: Insufficient documentation

## 2022-07-18 DIAGNOSIS — Z79890 Hormone replacement therapy (postmenopausal): Secondary | ICD-10-CM | POA: Insufficient documentation

## 2022-07-18 LAB — CBC WITH DIFFERENTIAL (CANCER CENTER ONLY)
Abs Immature Granulocytes: 0.01 10*3/uL (ref 0.00–0.07)
Basophils Absolute: 0.1 10*3/uL (ref 0.0–0.1)
Basophils Relative: 1 %
Eosinophils Absolute: 0.2 10*3/uL (ref 0.0–0.5)
Eosinophils Relative: 3 %
HCT: 39.8 % (ref 39.0–52.0)
Hemoglobin: 14 g/dL (ref 13.0–17.0)
Immature Granulocytes: 0 %
Lymphocytes Relative: 21 %
Lymphs Abs: 1.3 10*3/uL (ref 0.7–4.0)
MCH: 32.9 pg (ref 26.0–34.0)
MCHC: 35.2 g/dL (ref 30.0–36.0)
MCV: 93.6 fL (ref 80.0–100.0)
Monocytes Absolute: 0.4 10*3/uL (ref 0.1–1.0)
Monocytes Relative: 7 %
Neutro Abs: 4.2 10*3/uL (ref 1.7–7.7)
Neutrophils Relative %: 68 %
Platelet Count: 143 10*3/uL — ABNORMAL LOW (ref 150–400)
RBC: 4.25 MIL/uL (ref 4.22–5.81)
RDW: 14.1 % (ref 11.5–15.5)
Smear Review: NORMAL
WBC Count: 6.2 10*3/uL (ref 4.0–10.5)
nRBC: 0 % (ref 0.0–0.2)

## 2022-07-18 LAB — CMP (CANCER CENTER ONLY)
ALT: 6 U/L (ref 0–44)
AST: 13 U/L — ABNORMAL LOW (ref 15–41)
Albumin: 4.1 g/dL (ref 3.5–5.0)
Alkaline Phosphatase: 101 U/L (ref 38–126)
Anion gap: 4 — ABNORMAL LOW (ref 5–15)
BUN: 5 mg/dL — ABNORMAL LOW (ref 8–23)
CO2: 29 mmol/L (ref 22–32)
Calcium: 9.5 mg/dL (ref 8.9–10.3)
Chloride: 103 mmol/L (ref 98–111)
Creatinine: 0.65 mg/dL (ref 0.61–1.24)
GFR, Estimated: >60 mL/min (ref >60)
Glucose, Bld: 124 mg/dL — ABNORMAL HIGH (ref 70–99)
Potassium: 4.3 mmol/L (ref 3.5–5.1)
Sodium: 136 mmol/L (ref 135–145)
Total Bilirubin: 0.4 mg/dL (ref 0.3–1.2)
Total Protein: 6.5 g/dL (ref 6.5–8.1)

## 2022-07-18 MED ORDER — SODIUM CHLORIDE 0.9% FLUSH
10.0000 mL | Freq: Once | INTRAVENOUS | Status: AC
  Administered 2022-07-18: 10 mL

## 2022-07-18 MED ORDER — HEPARIN SOD (PORK) LOCK FLUSH 100 UNIT/ML IV SOLN
INTRAVENOUS | Status: AC
  Filled 2022-07-18: qty 5

## 2022-07-18 MED ORDER — IOHEXOL 300 MG/ML  SOLN
100.0000 mL | Freq: Once | INTRAMUSCULAR | Status: AC | PRN
  Administered 2022-07-18: 100 mL via INTRAVENOUS

## 2022-07-18 MED ORDER — HEPARIN SOD (PORK) LOCK FLUSH 100 UNIT/ML IV SOLN
500.0000 [IU] | Freq: Once | INTRAVENOUS | Status: AC
  Administered 2022-07-18: 500 [IU] via INTRAVENOUS

## 2022-07-20 ENCOUNTER — Other Ambulatory Visit: Payer: Self-pay | Admitting: Physician Assistant

## 2022-07-20 DIAGNOSIS — E039 Hypothyroidism, unspecified: Secondary | ICD-10-CM

## 2022-07-22 ENCOUNTER — Other Ambulatory Visit: Payer: Self-pay

## 2022-07-22 ENCOUNTER — Inpatient Hospital Stay (HOSPITAL_BASED_OUTPATIENT_CLINIC_OR_DEPARTMENT_OTHER): Payer: Medicare Other | Admitting: Internal Medicine

## 2022-07-22 VITALS — BP 143/87 | HR 107 | Temp 97.6°F | Resp 16 | Wt 145.0 lb

## 2022-07-22 DIAGNOSIS — C3490 Malignant neoplasm of unspecified part of unspecified bronchus or lung: Secondary | ICD-10-CM

## 2022-07-22 DIAGNOSIS — Z79890 Hormone replacement therapy (postmenopausal): Secondary | ICD-10-CM | POA: Diagnosis not present

## 2022-07-22 DIAGNOSIS — E039 Hypothyroidism, unspecified: Secondary | ICD-10-CM | POA: Diagnosis not present

## 2022-07-22 DIAGNOSIS — Z85118 Personal history of other malignant neoplasm of bronchus and lung: Secondary | ICD-10-CM | POA: Diagnosis present
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
## 2022-08-13 ENCOUNTER — Other Ambulatory Visit: Payer: Self-pay | Admitting: Physician Assistant

## 2022-08-13 DIAGNOSIS — C3492 Malignant neoplasm of unspecified part of left bronchus or lung: Secondary | ICD-10-CM

## 2022-08-15 ENCOUNTER — Other Ambulatory Visit: Payer: Self-pay | Admitting: Physician Assistant

## 2022-08-15 DIAGNOSIS — C3492 Malignant neoplasm of unspecified part of left bronchus or lung: Secondary | ICD-10-CM

## 2022-08-15 DIAGNOSIS — E039 Hypothyroidism, unspecified: Secondary | ICD-10-CM

## 2022-08-15 MED ORDER — LEVOTHYROXINE SODIUM 75 MCG PO TABS: 75.0000 ug | ORAL_TABLET | Freq: Every day | ORAL | 3 refills | Status: AC

## 2022-09-14 ENCOUNTER — Other Ambulatory Visit: Payer: Self-pay | Admitting: Physician Assistant

## 2022-09-14 DIAGNOSIS — C3492 Malignant neoplasm of unspecified part of left bronchus or lung: Secondary | ICD-10-CM

## 2022-09-25 ENCOUNTER — Telehealth: Payer: Self-pay | Admitting: Internal Medicine

## 2022-09-25 NOTE — Telephone Encounter (Signed)
Called patient regarding upcoming March appointment, patient is notified.

## 2022-10-12 ENCOUNTER — Other Ambulatory Visit: Payer: Self-pay | Admitting: Physician Assistant

## 2022-10-12 DIAGNOSIS — C3492 Malignant neoplasm of unspecified part of left bronchus or lung: Secondary | ICD-10-CM

## 2022-10-16 ENCOUNTER — Ambulatory Visit (HOSPITAL_COMMUNITY)
Admission: RE | Admit: 2022-10-16 | Discharge: 2022-10-16 | Disposition: A | Discharge status: Home / Self Care | Payer: Medicare Other | Source: Ambulatory Visit | Attending: Internal Medicine | Admitting: Internal Medicine

## 2022-10-16 ENCOUNTER — Inpatient Hospital Stay: Payer: Medicare Other | Attending: Internal Medicine

## 2022-10-16 ENCOUNTER — Encounter (HOSPITAL_COMMUNITY): Payer: Self-pay

## 2022-10-16 ENCOUNTER — Other Ambulatory Visit: Payer: Self-pay

## 2022-10-16 DIAGNOSIS — C3492 Malignant neoplasm of unspecified part of left bronchus or lung: Secondary | ICD-10-CM

## 2022-10-16 DIAGNOSIS — E039 Hypothyroidism, unspecified: Secondary | ICD-10-CM | POA: Diagnosis not present

## 2022-10-16 DIAGNOSIS — C7989 Secondary malignant neoplasm of other specified sites: Secondary | ICD-10-CM | POA: Insufficient documentation

## 2022-10-16 DIAGNOSIS — C3490 Malignant neoplasm of unspecified part of unspecified bronchus or lung: Secondary | ICD-10-CM

## 2022-10-16 DIAGNOSIS — Z95828 Presence of other vascular implants and grafts: Secondary | ICD-10-CM

## 2022-10-16 DIAGNOSIS — Z85118 Personal history of other malignant neoplasm of bronchus and lung: Secondary | ICD-10-CM | POA: Diagnosis not present

## 2022-10-16 LAB — CBC WITH DIFFERENTIAL (CANCER CENTER ONLY)
Abs Immature Granulocytes: 0.01 10*3/uL (ref 0.00–0.07)
Basophils Absolute: 0.1 10*3/uL (ref 0.0–0.1)
Basophils Relative: 1 %
Eosinophils Absolute: 0.4 10*3/uL (ref 0.0–0.5)
Eosinophils Relative: 6 %
HCT: 41 % (ref 39.0–52.0)
Hemoglobin: 14.5 g/dL (ref 13.0–17.0)
Immature Granulocytes: 0 %
Lymphocytes Relative: 28 %
Lymphs Abs: 1.7 10*3/uL (ref 0.7–4.0)
MCH: 33 pg (ref 26.0–34.0)
MCHC: 35.4 g/dL (ref 30.0–36.0)
MCV: 93.4 fL (ref 80.0–100.0)
Monocytes Absolute: 0.5 10*3/uL (ref 0.1–1.0)
Monocytes Relative: 9 %
Neutro Abs: 3.4 10*3/uL (ref 1.7–7.7)
Neutrophils Relative %: 56 %
Platelet Count: 208 10*3/uL (ref 150–400)
RBC: 4.39 MIL/uL (ref 4.22–5.81)
RDW: 13.7 % (ref 11.5–15.5)
WBC Count: 6.1 10*3/uL (ref 4.0–10.5)
nRBC: 0 % (ref 0.0–0.2)

## 2022-10-16 LAB — CMP (CANCER CENTER ONLY)
ALT: 5 U/L (ref 0–44)
AST: 12 U/L — ABNORMAL LOW (ref 15–41)
Albumin: 4.3 g/dL (ref 3.5–5.0)
Alkaline Phosphatase: 75 U/L (ref 38–126)
Anion gap: 7 (ref 5–15)
BUN: 6 mg/dL — ABNORMAL LOW (ref 8–23)
CO2: 28 mmol/L (ref 22–32)
Calcium: 9.4 mg/dL (ref 8.9–10.3)
Chloride: 104 mmol/L (ref 98–111)
Creatinine: 0.75 mg/dL (ref 0.61–1.24)
GFR, Estimated: >60 mL/min (ref >60)
Glucose, Bld: 124 mg/dL — ABNORMAL HIGH (ref 70–99)
Potassium: 3.9 mmol/L (ref 3.5–5.1)
Sodium: 139 mmol/L (ref 135–145)
Total Bilirubin: 0.9 mg/dL (ref 0.3–1.2)
Total Protein: 7 g/dL (ref 6.5–8.1)

## 2022-10-16 LAB — TSH: TSH: 5.645 u[IU]/mL — ABNORMAL HIGH (ref 0.350–4.500)

## 2022-10-16 MED ORDER — HEPARIN SOD (PORK) LOCK FLUSH 100 UNIT/ML IV SOLN: 500.0000 [IU] | Freq: Once | INTRAVENOUS | Status: AC

## 2022-10-16 MED ORDER — HEPARIN SOD (PORK) LOCK FLUSH 100 UNIT/ML IV SOLN
INTRAVENOUS | Status: AC
  Administered 2022-10-16: 500 [IU] via INTRAVENOUS
  Filled 2022-10-16: qty 5

## 2022-10-16 MED ORDER — SODIUM CHLORIDE 0.9% FLUSH
10.0000 mL | Freq: Once | INTRAVENOUS | Status: AC
  Administered 2022-10-16: 10 mL

## 2022-10-16 MED ORDER — IOHEXOL 300 MG/ML  SOLN
100.0000 mL | Freq: Once | INTRAMUSCULAR | Status: AC | PRN
  Administered 2022-10-16: 100 mL via INTRAVENOUS

## 2022-10-17 ENCOUNTER — Other Ambulatory Visit: Payer: Medicare Other

## 2022-10-21 ENCOUNTER — Other Ambulatory Visit: Payer: Self-pay

## 2022-10-21 ENCOUNTER — Inpatient Hospital Stay (HOSPITAL_BASED_OUTPATIENT_CLINIC_OR_DEPARTMENT_OTHER): Payer: Medicare Other | Admitting: Internal Medicine

## 2022-10-21 VITALS — BP 122/77 | HR 78 | Temp 97.0°F | Resp 16 | Wt 145.4 lb

## 2022-10-21 DIAGNOSIS — C7989 Secondary malignant neoplasm of other specified sites: Secondary | ICD-10-CM | POA: Diagnosis not present

## 2022-10-21 DIAGNOSIS — C3490 Malignant neoplasm of unspecified part of unspecified bronchus or lung: Secondary | ICD-10-CM | POA: Diagnosis not present
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
## 2022-12-25 ENCOUNTER — Other Ambulatory Visit: Payer: Self-pay | Admitting: Physician Assistant

## 2022-12-25 DIAGNOSIS — C3492 Malignant neoplasm of unspecified part of left bronchus or lung: Secondary | ICD-10-CM

## 2023-01-16 ENCOUNTER — Telehealth: Payer: Self-pay | Admitting: Internal Medicine

## 2023-01-16 NOTE — Telephone Encounter (Signed)
Called patient regarding July appointments, left a voicemail. 

## 2023-01-19 ENCOUNTER — Other Ambulatory Visit: Payer: Self-pay | Admitting: Physician Assistant

## 2023-01-19 DIAGNOSIS — C3492 Malignant neoplasm of unspecified part of left bronchus or lung: Secondary | ICD-10-CM

## 2023-01-27 ENCOUNTER — Telehealth: Payer: Self-pay | Admitting: Internal Medicine

## 2023-01-27 NOTE — Telephone Encounter (Signed)
Called patient regarding upcoming July appointments, patient is notified. 

## 2023-02-10 ENCOUNTER — Telehealth: Payer: Self-pay | Admitting: Medical Oncology

## 2023-02-10 NOTE — Telephone Encounter (Signed)
"   I am not coming in no more.  Every time I have that scan it is hard for me to urinate after.  The past 4 months I have felt very good. I am tired of going to hospital. I don't use any Remeron or Gabapentin. I am just tired of coming in . Tell [facility] Ped thank you for everything".  I told Hannan to call if we can help him.

## 2023-02-16 ENCOUNTER — Other Ambulatory Visit: Payer: Medicare Other

## 2023-02-18 ENCOUNTER — Other Ambulatory Visit: Payer: Medicare Other

## 2023-02-18 ENCOUNTER — Ambulatory Visit (HOSPITAL_COMMUNITY): Payer: Medicare Other

## 2023-02-18 ENCOUNTER — Ambulatory Visit: Payer: Medicare Other | Admitting: Internal Medicine

## 2023-02-19 ENCOUNTER — Ambulatory Visit: Payer: Medicare Other | Admitting: Physician Assistant

## 2023-02-23 ENCOUNTER — Ambulatory Visit: Payer: Medicare Other | Admitting: Physician Assistant

## 2023-08-24 ENCOUNTER — Other Ambulatory Visit: Payer: Self-pay | Admitting: Physician Assistant

## 2023-08-24 DIAGNOSIS — C3492 Malignant neoplasm of unspecified part of left bronchus or lung: Secondary | ICD-10-CM
